# Patient Record
Sex: Male | Born: 1950 | Race: Black or African American | Hispanic: No | Marital: Married | Smoking: Current every day smoker
Health system: Southern US, Community
[De-identification: ages and names within clinical notes are randomized; demographics above are authoritative.]

## PROBLEM LIST (undated history)

## (undated) DIAGNOSIS — Z9289 Personal history of other medical treatment: Secondary | ICD-10-CM

## (undated) DIAGNOSIS — M545 Low back pain, unspecified: Secondary | ICD-10-CM

## (undated) DIAGNOSIS — C9110 Chronic lymphocytic leukemia of B-cell type not having achieved remission: Secondary | ICD-10-CM

## (undated) DIAGNOSIS — I2510 Atherosclerotic heart disease of native coronary artery without angina pectoris: Secondary | ICD-10-CM

## (undated) DIAGNOSIS — D649 Anemia, unspecified: Secondary | ICD-10-CM

## (undated) DIAGNOSIS — M1990 Unspecified osteoarthritis, unspecified site: Secondary | ICD-10-CM

## (undated) DIAGNOSIS — E785 Hyperlipidemia, unspecified: Secondary | ICD-10-CM

## (undated) DIAGNOSIS — I739 Peripheral vascular disease, unspecified: Secondary | ICD-10-CM

## (undated) DIAGNOSIS — M542 Cervicalgia: Secondary | ICD-10-CM

## (undated) HISTORY — PX: COLONOSCOPY: SHX174

## (undated) HISTORY — DX: Peripheral vascular disease, unspecified: I73.9

## (undated) HISTORY — DX: Personal history of other medical treatment: Z92.89

## (undated) HISTORY — DX: Atherosclerotic heart disease of native coronary artery without angina pectoris: I25.10

## (undated) HISTORY — DX: Low back pain: M54.5

## (undated) HISTORY — DX: Anemia, unspecified: D64.9

## (undated) HISTORY — DX: Cervicalgia: M54.2

## (undated) HISTORY — DX: Chronic lymphocytic leukemia of B-cell type not having achieved remission: C91.10

## (undated) HISTORY — DX: Hyperlipidemia, unspecified: E78.5

## (undated) HISTORY — DX: Low back pain, unspecified: M54.50

---

## 2006-12-21 DIAGNOSIS — D649 Anemia, unspecified: Secondary | ICD-10-CM

## 2006-12-21 HISTORY — DX: Anemia, unspecified: D64.9

## 2008-02-06 ENCOUNTER — Ambulatory Visit: Payer: Self-pay | Admitting: Internal Medicine

## 2009-12-21 HISTORY — PX: OTHER SURGICAL HISTORY: SHX169

## 2010-09-29 ENCOUNTER — Inpatient Hospital Stay: Payer: Self-pay | Admitting: Internal Medicine

## 2010-10-01 LAB — PATHOLOGY REPORT

## 2011-04-23 ENCOUNTER — Ambulatory Visit: Payer: Self-pay | Admitting: Internal Medicine

## 2011-05-22 ENCOUNTER — Ambulatory Visit: Payer: Self-pay | Admitting: Internal Medicine

## 2011-05-26 ENCOUNTER — Ambulatory Visit: Payer: Self-pay | Admitting: Rheumatology

## 2011-06-21 ENCOUNTER — Ambulatory Visit: Payer: Self-pay | Admitting: Internal Medicine

## 2012-01-01 ENCOUNTER — Ambulatory Visit: Payer: Self-pay | Admitting: Internal Medicine

## 2012-01-01 LAB — COMPREHENSIVE METABOLIC PANEL
Albumin: 3.9 g/dL (ref 3.4–5.0)
Anion Gap: 9 (ref 7–16)
Calcium, Total: 9 mg/dL (ref 8.5–10.1)
Chloride: 105 mmol/L (ref 98–107)
Co2: 27 mmol/L (ref 21–32)
EGFR (Non-African Amer.): >60
Glucose: 109 mg/dL — ABNORMAL HIGH (ref 65–99)
Osmolality: 282 (ref 275–301)
SGPT (ALT): 24 U/L
Sodium: 141 mmol/L (ref 136–145)
Total Protein: 7.1 g/dL (ref 6.4–8.2)

## 2012-01-01 LAB — CBC CANCER CENTER
Basophil #: 0.1 x10 3/mm (ref 0.0–0.1)
Basophil %: 1.2 %
Eosinophil #: 0.4 x10 3/mm (ref 0.0–0.7)
Eosinophil %: 6.2 %
HCT: 33.6 % — ABNORMAL LOW (ref 40.0–52.0)
HGB: 11 g/dL — ABNORMAL LOW (ref 13.0–18.0)
Lymphocyte #: 1.3 x10 3/mm (ref 1.0–3.6)
MCV: 76 fL — ABNORMAL LOW (ref 80–100)
Monocyte #: 0.3 x10 3/mm (ref 0.0–0.7)
Monocyte %: 5.3 %
RDW: 15.6 % — ABNORMAL HIGH (ref 11.5–14.5)
WBC: 5.7 x10 3/mm (ref 3.8–10.6)

## 2012-01-01 LAB — IRON AND TIBC
Iron Bind.Cap.(Total): 299 ug/dL (ref 250–450)
Iron Saturation: 24 %
Iron: 73 ug/dL (ref 65–175)

## 2012-01-01 LAB — FERRITIN: Ferritin (ARMC): 173 ng/mL (ref 8–388)

## 2012-01-01 LAB — URIC ACID: Uric Acid: 4.4 mg/dL (ref 3.5–7.2)

## 2012-01-22 ENCOUNTER — Ambulatory Visit: Payer: Self-pay | Admitting: Internal Medicine

## 2013-05-21 ENCOUNTER — Ambulatory Visit: Payer: Self-pay | Admitting: Internal Medicine

## 2013-06-14 LAB — COMPREHENSIVE METABOLIC PANEL
Albumin: 3.7 g/dL (ref 3.4–5.0)
Alkaline Phosphatase: 75 U/L (ref 50–136)
Anion Gap: 7 (ref 7–16)
Calcium, Total: 8.9 mg/dL (ref 8.5–10.1)
Chloride: 106 mmol/L (ref 98–107)
Co2: 29 mmol/L (ref 21–32)
Creatinine: 1.21 mg/dL (ref 0.60–1.30)
EGFR (African American): >60
EGFR (Non-African Amer.): >60
Glucose: 89 mg/dL (ref 65–99)
Osmolality: 282 (ref 275–301)
Potassium: 4 mmol/L (ref 3.5–5.1)
Sodium: 142 mmol/L (ref 136–145)

## 2013-06-14 LAB — IRON AND TIBC
Iron Bind.Cap.(Total): 244 ug/dL — ABNORMAL LOW (ref 250–450)
Iron Saturation: 18 %
Iron: 43 ug/dL — ABNORMAL LOW (ref 65–175)
Unbound Iron-Bind.Cap.: 201 ug/dL

## 2013-06-14 LAB — CBC CANCER CENTER
Basophil #: 0 x10 3/mm (ref 0.0–0.1)
Eosinophil %: 7.9 %
HGB: 10.2 g/dL — ABNORMAL LOW (ref 13.0–18.0)
Lymphocyte %: 47.6 %
MCH: 24.4 pg — ABNORMAL LOW (ref 26.0–34.0)
MCHC: 32.6 g/dL (ref 32.0–36.0)
Monocyte #: 0.2 x10 3/mm (ref 0.2–1.0)
Monocyte %: 7.4 %
Neutrophil #: 1.1 x10 3/mm — ABNORMAL LOW (ref 1.4–6.5)
Neutrophil %: 35.8 %
Platelet: 208 x10 3/mm (ref 150–440)
RDW: 15.3 % — ABNORMAL HIGH (ref 11.5–14.5)
WBC: 3 x10 3/mm — ABNORMAL LOW (ref 3.8–10.6)

## 2013-06-14 LAB — LACTATE DEHYDROGENASE: LDH: 189 U/L (ref 85–241)

## 2013-06-14 LAB — FERRITIN: Ferritin (ARMC): 136 ng/mL (ref 8–388)

## 2013-06-20 ENCOUNTER — Ambulatory Visit: Payer: Self-pay | Admitting: Internal Medicine

## 2013-07-25 ENCOUNTER — Ambulatory Visit: Payer: Self-pay | Admitting: Internal Medicine

## 2013-12-18 ENCOUNTER — Ambulatory Visit: Payer: Self-pay | Admitting: Vascular Surgery

## 2013-12-18 LAB — BASIC METABOLIC PANEL
Calcium, Total: 8.8 mg/dL (ref 8.5–10.1)
Chloride: 106 mmol/L (ref 98–107)
Co2: 30 mmol/L (ref 21–32)
Creatinine: 1.05 mg/dL (ref 0.60–1.30)
EGFR (African American): >60
Osmolality: 279 (ref 275–301)
Potassium: 4.1 mmol/L (ref 3.5–5.1)
Sodium: 140 mmol/L (ref 136–145)

## 2014-02-07 ENCOUNTER — Inpatient Hospital Stay: Payer: Self-pay | Admitting: Internal Medicine

## 2014-02-07 LAB — HEPATIC FUNCTION PANEL A (ARMC)
Albumin: 3.4 g/dL
Alkaline Phosphatase: 65 U/L
Bilirubin, Direct: 0.5 mg/dL — ABNORMAL HIGH
Bilirubin,Total: 3.4 mg/dL — ABNORMAL HIGH
SGOT(AST): 27 U/L
SGPT (ALT): 11 U/L — ABNORMAL LOW
Total Protein: 7.5 g/dL

## 2014-02-07 LAB — BASIC METABOLIC PANEL WITH GFR
Anion Gap: 6 — ABNORMAL LOW
BUN: 17 mg/dL
Calcium, Total: 9.2 mg/dL
Chloride: 102 mmol/L
Co2: 24 mmol/L
Creatinine: 1.21 mg/dL
EGFR (African American): >60
EGFR (Non-African Amer.): >60
Glucose: 104 mg/dL — ABNORMAL HIGH
Osmolality: 266
Potassium: 3.9 mmol/L
Sodium: 132 mmol/L — ABNORMAL LOW

## 2014-02-07 LAB — CBC
HCT: 12.4 % — CL
HGB: 4.2 g/dL — CL
MCH: 26.2 pg
MCHC: 33.7 g/dL
MCV: 78 fL — ABNORMAL LOW
Platelet: 245 10*3/uL
RBC: 1.59 x10 6/mm 3 — ABNORMAL LOW
RDW: 14.9 % — ABNORMAL HIGH
WBC: 2.9 10*3/uL — ABNORMAL LOW

## 2014-02-07 LAB — FERRITIN: Ferritin (ARMC): 259 ng/mL

## 2014-02-07 LAB — IRON AND TIBC
Iron Bind.Cap.(Total): 243 ug/dL — ABNORMAL LOW
Iron Saturation: 26 %
Iron: 64 ug/dL — ABNORMAL LOW
Unbound Iron-Bind.Cap.: 179 ug/dL

## 2014-02-07 LAB — TROPONIN I

## 2014-02-07 LAB — LACTATE DEHYDROGENASE: LDH: 322 U/L — AB (ref 85–241)

## 2014-02-08 ENCOUNTER — Ambulatory Visit: Payer: Self-pay | Admitting: Hematology and Oncology

## 2014-02-08 LAB — CBC WITH DIFFERENTIAL/PLATELET
COMMENT - H1-COM1: NORMAL
HCT: 19.1 % — ABNORMAL LOW (ref 40.0–52.0)
HGB: 6.4 g/dL — ABNORMAL LOW (ref 13.0–18.0)
Lymphocytes: 82 %
MCH: 26.8 pg (ref 26.0–34.0)
MCHC: 33.4 g/dL (ref 32.0–36.0)
MCV: 80 fL (ref 80–100)
MONOS PCT: 10 %
PLATELETS: 209 10*3/uL (ref 150–440)
RBC: 2.38 10*6/uL — AB (ref 4.40–5.90)
RDW: 17.6 % — ABNORMAL HIGH (ref 11.5–14.5)
SEGMENTED NEUTROPHILS: 4 %
Variant Lymphocyte - H1-Rlymph: 4 %
WBC: 4.1 10*3/uL (ref 3.8–10.6)

## 2014-02-08 LAB — URINALYSIS, COMPLETE
BACTERIA: NONE SEEN
BILIRUBIN, UR: NEGATIVE
BLOOD: NEGATIVE
Glucose,UR: NEGATIVE mg/dL (ref 0–75)
Ketone: NEGATIVE
LEUKOCYTE ESTERASE: NEGATIVE
Nitrite: NEGATIVE
Ph: 5 (ref 4.5–8.0)
Protein: NEGATIVE
RBC,UR: <1 /HPF (ref 0–5)
Specific Gravity: 1.014 (ref 1.003–1.030)
Squamous Epithelial: NONE SEEN
WBC UR: NONE SEEN /HPF (ref 0–5)

## 2014-02-08 LAB — BASIC METABOLIC PANEL
ANION GAP: 3 — AB (ref 7–16)
BUN: 18 mg/dL (ref 7–18)
CREATININE: 1.15 mg/dL (ref 0.60–1.30)
Calcium, Total: 9 mg/dL (ref 8.5–10.1)
Chloride: 105 mmol/L (ref 98–107)
Co2: 25 mmol/L (ref 21–32)
EGFR (African American): >60
Glucose: 90 mg/dL (ref 65–99)
Osmolality: 268 (ref 275–301)
POTASSIUM: 4.2 mmol/L (ref 3.5–5.1)
Sodium: 133 mmol/L — ABNORMAL LOW (ref 136–145)

## 2014-02-09 ENCOUNTER — Ambulatory Visit: Payer: Self-pay | Admitting: Hematology and Oncology

## 2014-02-09 LAB — CBC WITH DIFFERENTIAL/PLATELET
HCT: 19 % — AB (ref 40.0–52.0)
HGB: 6.8 g/dL — ABNORMAL LOW (ref 13.0–18.0)
LYMPHS PCT: 88 %
MCH: 29.5 pg (ref 26.0–34.0)
MCHC: 35.6 g/dL (ref 32.0–36.0)
MCV: 83 fL (ref 80–100)
Monocytes: 5 %
OTHER CELLS BLOOD: 6
Platelet: 157 10*3/uL (ref 150–440)
RBC: 2.29 10*6/uL — ABNORMAL LOW (ref 4.40–5.90)
RDW: 18 % — ABNORMAL HIGH (ref 11.5–14.5)
Segmented Neutrophils: 1 %
WBC: 3.8 10*3/uL (ref 3.8–10.6)

## 2014-02-09 LAB — BASIC METABOLIC PANEL
ANION GAP: 5 — AB (ref 7–16)
BUN: 15 mg/dL (ref 7–18)
CALCIUM: 8.6 mg/dL (ref 8.5–10.1)
CHLORIDE: 104 mmol/L (ref 98–107)
CREATININE: 0.9 mg/dL (ref 0.60–1.30)
Co2: 27 mmol/L (ref 21–32)
EGFR (African American): >60
GLUCOSE: 109 mg/dL — AB (ref 65–99)
OSMOLALITY: 273 (ref 275–301)
POTASSIUM: 4.2 mmol/L (ref 3.5–5.1)
SODIUM: 136 mmol/L (ref 136–145)

## 2014-02-09 LAB — HEPATIC FUNCTION PANEL A (ARMC)
Albumin: 3 g/dL — ABNORMAL LOW (ref 3.4–5.0)
Alkaline Phosphatase: 61 U/L
Bilirubin, Direct: 0.3 mg/dL — ABNORMAL HIGH (ref 0.00–0.20)
Bilirubin,Total: 1.4 mg/dL — ABNORMAL HIGH (ref 0.2–1.0)
SGOT(AST): 31 U/L (ref 15–37)
SGPT (ALT): 14 U/L (ref 12–78)
Total Protein: 7.1 g/dL (ref 6.4–8.2)

## 2014-02-10 LAB — CBC WITH DIFFERENTIAL/PLATELET
HCT: 23.4 % — AB (ref 40.0–52.0)
HGB: 7.9 g/dL — AB (ref 13.0–18.0)
Lymphocytes: 92 %
MCH: 27 pg (ref 26.0–34.0)
MCHC: 34 g/dL (ref 32.0–36.0)
MCV: 80 fL (ref 80–100)
Monocytes: 1 %
Platelet: 114 10*3/uL — ABNORMAL LOW (ref 150–440)
RBC: 2.94 10*6/uL — AB (ref 4.40–5.90)
RDW: 17.6 % — ABNORMAL HIGH (ref 11.5–14.5)
Segmented Neutrophils: 5 %
VARIANT LYMPHOCYTE - H1-RLYMPH: 2 %
WBC: 4.1 10*3/uL (ref 3.8–10.6)

## 2014-02-10 LAB — BILIRUBIN, TOTAL: BILIRUBIN TOTAL: 0.9 mg/dL (ref 0.2–1.0)

## 2014-02-16 LAB — CBC CANCER CENTER
BASOS ABS: 0.1 x10 3/mm (ref 0.0–0.1)
Basophil %: 0.5 %
EOS PCT: 0.6 %
Eosinophil #: 0.1 x10 3/mm (ref 0.0–0.7)
HCT: 23.6 % — ABNORMAL LOW (ref 40.0–52.0)
HGB: 7.6 g/dL — AB (ref 13.0–18.0)
Lymphocyte #: 9.7 x10 3/mm — ABNORMAL HIGH (ref 1.0–3.6)
Lymphocyte %: 76.3 %
MCH: 26.3 pg (ref 26.0–34.0)
MCHC: 32.2 g/dL (ref 32.0–36.0)
MCV: 82 fL (ref 80–100)
MONOS PCT: 3.2 %
Monocyte #: 0.4 x10 3/mm (ref 0.2–1.0)
NEUTROS PCT: 19.4 %
Neutrophil #: 2.5 x10 3/mm (ref 1.4–6.5)
Platelet: 230 x10 3/mm (ref 150–440)
RBC: 2.89 10*6/uL — ABNORMAL LOW (ref 4.40–5.90)
RDW: 19.4 % — AB (ref 11.5–14.5)
WBC: 12.7 x10 3/mm — ABNORMAL HIGH (ref 3.8–10.6)

## 2014-02-18 ENCOUNTER — Ambulatory Visit: Payer: Self-pay | Admitting: Hematology and Oncology

## 2014-02-23 LAB — CBC CANCER CENTER
BASOS ABS: 0.2 x10 3/mm — AB (ref 0.0–0.1)
BASOS PCT: 0.7 %
EOS PCT: 0.9 %
Eosinophil #: 0.2 x10 3/mm (ref 0.0–0.7)
HCT: 26.5 % — ABNORMAL LOW (ref 40.0–52.0)
HGB: 8.2 g/dL — ABNORMAL LOW (ref 13.0–18.0)
LYMPHS PCT: 57.2 %
Lymphocyte #: 13.8 x10 3/mm — ABNORMAL HIGH (ref 1.0–3.6)
MCH: 26.4 pg (ref 26.0–34.0)
MCHC: 30.9 g/dL — ABNORMAL LOW (ref 32.0–36.0)
MCV: 86 fL (ref 80–100)
MONO ABS: 0.9 x10 3/mm (ref 0.2–1.0)
MONOS PCT: 3.7 %
NEUTROS ABS: 9 x10 3/mm — AB (ref 1.4–6.5)
Neutrophil %: 37.5 %
PLATELETS: 339 x10 3/mm (ref 150–440)
RBC: 3.09 10*6/uL — ABNORMAL LOW (ref 4.40–5.90)
RDW: 22.1 % — ABNORMAL HIGH (ref 11.5–14.5)
WBC: 24.1 x10 3/mm — ABNORMAL HIGH (ref 3.8–10.6)

## 2014-03-09 LAB — CBC CANCER CENTER
BASOS ABS: 0.1 x10 3/mm (ref 0.0–0.1)
BASOS PCT: 0.5 %
EOS ABS: 0.3 x10 3/mm (ref 0.0–0.7)
Eosinophil %: 2 %
HCT: 33.2 % — ABNORMAL LOW (ref 40.0–52.0)
HGB: 10.1 g/dL — ABNORMAL LOW (ref 13.0–18.0)
Lymphocyte #: 8.9 x10 3/mm — ABNORMAL HIGH (ref 1.0–3.6)
Lymphocyte %: 64.7 %
MCH: 26.5 pg (ref 26.0–34.0)
MCHC: 30.5 g/dL — AB (ref 32.0–36.0)
MCV: 87 fL (ref 80–100)
MONOS PCT: 3.6 %
Monocyte #: 0.5 x10 3/mm (ref 0.2–1.0)
NEUTROS ABS: 4 x10 3/mm (ref 1.4–6.5)
Neutrophil %: 29.2 %
Platelet: 273 x10 3/mm (ref 150–440)
RBC: 3.83 10*6/uL — ABNORMAL LOW (ref 4.40–5.90)
RDW: 18.8 % — AB (ref 11.5–14.5)
WBC: 13.7 x10 3/mm — ABNORMAL HIGH (ref 3.8–10.6)

## 2014-03-13 LAB — CBC CANCER CENTER
BASOS ABS: 0.1 x10 3/mm (ref 0.0–0.1)
BASOS PCT: 0.8 %
Eosinophil #: 0.1 x10 3/mm (ref 0.0–0.7)
Eosinophil %: 1 %
HCT: 30.9 % — ABNORMAL LOW (ref 40.0–52.0)
HGB: 9.6 g/dL — AB (ref 13.0–18.0)
LYMPHS ABS: 7.4 x10 3/mm — AB (ref 1.0–3.6)
LYMPHS PCT: 55.1 %
MCH: 27 pg (ref 26.0–34.0)
MCHC: 31.2 g/dL — ABNORMAL LOW (ref 32.0–36.0)
MCV: 87 fL (ref 80–100)
MONO ABS: 0.4 x10 3/mm (ref 0.2–1.0)
Monocyte %: 3 %
Neutrophil #: 5.4 x10 3/mm (ref 1.4–6.5)
Neutrophil %: 40.1 %
PLATELETS: 324 x10 3/mm (ref 150–440)
RBC: 3.57 10*6/uL — AB (ref 4.40–5.90)
RDW: 17.7 % — ABNORMAL HIGH (ref 11.5–14.5)
WBC: 13.4 x10 3/mm — ABNORMAL HIGH (ref 3.8–10.6)

## 2014-03-13 LAB — BASIC METABOLIC PANEL
Anion Gap: 10 (ref 7–16)
BUN: 15 mg/dL (ref 7–18)
CO2: 27 mmol/L (ref 21–32)
CREATININE: 1.18 mg/dL (ref 0.60–1.30)
Calcium, Total: 8.8 mg/dL (ref 8.5–10.1)
Chloride: 104 mmol/L (ref 98–107)
EGFR (African American): >60
GLUCOSE: 140 mg/dL — AB (ref 65–99)
Osmolality: 284 (ref 275–301)
POTASSIUM: 3.5 mmol/L (ref 3.5–5.1)
Sodium: 141 mmol/L (ref 136–145)

## 2014-03-13 LAB — LACTATE DEHYDROGENASE: LDH: 134 U/L (ref 85–241)

## 2014-03-21 ENCOUNTER — Ambulatory Visit: Payer: Self-pay | Admitting: Hematology and Oncology

## 2014-05-02 ENCOUNTER — Ambulatory Visit: Payer: Self-pay | Admitting: Hematology and Oncology

## 2014-05-16 LAB — COMPREHENSIVE METABOLIC PANEL
Albumin: 3.6 g/dL (ref 3.4–5.0)
Alkaline Phosphatase: 66 U/L
Anion Gap: 3 — ABNORMAL LOW (ref 7–16)
BILIRUBIN TOTAL: 0.7 mg/dL (ref 0.2–1.0)
BUN: 13 mg/dL (ref 7–18)
CO2: 30 mmol/L (ref 21–32)
CREATININE: 1.08 mg/dL (ref 0.60–1.30)
Calcium, Total: 9.2 mg/dL (ref 8.5–10.1)
Chloride: 107 mmol/L (ref 98–107)
EGFR (Non-African Amer.): >60
GLUCOSE: 101 mg/dL — AB (ref 65–99)
Osmolality: 280 (ref 275–301)
POTASSIUM: 4.1 mmol/L (ref 3.5–5.1)
SGOT(AST): 20 U/L (ref 15–37)
SGPT (ALT): 14 U/L (ref 12–78)
SODIUM: 140 mmol/L (ref 136–145)
Total Protein: 6.9 g/dL (ref 6.4–8.2)

## 2014-05-16 LAB — CBC CANCER CENTER
BASOS PCT: 0.7 %
Basophil #: 0 x10 3/mm (ref 0.0–0.1)
Eosinophil #: 0.3 x10 3/mm (ref 0.0–0.7)
Eosinophil %: 6.4 %
HCT: 34.9 % — ABNORMAL LOW (ref 40.0–52.0)
HGB: 10.9 g/dL — ABNORMAL LOW (ref 13.0–18.0)
LYMPHS PCT: 40.7 %
Lymphocyte #: 1.8 x10 3/mm (ref 1.0–3.6)
MCH: 24.8 pg — AB (ref 26.0–34.0)
MCHC: 31.4 g/dL — AB (ref 32.0–36.0)
MCV: 79 fL — AB (ref 80–100)
MONOS PCT: 9.3 %
Monocyte #: 0.4 x10 3/mm (ref 0.2–1.0)
Neutrophil #: 1.9 x10 3/mm (ref 1.4–6.5)
Neutrophil %: 42.9 %
Platelet: 221 x10 3/mm (ref 150–440)
RBC: 4.41 10*6/uL (ref 4.40–5.90)
RDW: 14.7 % — ABNORMAL HIGH (ref 11.5–14.5)
WBC: 4.4 x10 3/mm (ref 3.8–10.6)

## 2014-05-21 ENCOUNTER — Ambulatory Visit: Payer: Self-pay | Admitting: Hematology and Oncology

## 2014-06-18 LAB — CBC CANCER CENTER
BASOS PCT: 1.2 %
Basophil #: 0.1 x10 3/mm (ref 0.0–0.1)
EOS ABS: 0.2 x10 3/mm (ref 0.0–0.7)
Eosinophil %: 5.4 %
HCT: 28.8 % — ABNORMAL LOW (ref 40.0–52.0)
HGB: 9.3 g/dL — ABNORMAL LOW (ref 13.0–18.0)
LYMPHS ABS: 1.5 x10 3/mm (ref 1.0–3.6)
LYMPHS PCT: 35.3 %
MCH: 24.4 pg — ABNORMAL LOW (ref 26.0–34.0)
MCHC: 32.3 g/dL (ref 32.0–36.0)
MCV: 76 fL — AB (ref 80–100)
Monocyte #: 0.2 x10 3/mm (ref 0.2–1.0)
Monocyte %: 5.4 %
Neutrophil #: 2.3 x10 3/mm (ref 1.4–6.5)
Neutrophil %: 52.7 %
Platelet: 236 x10 3/mm (ref 150–440)
RBC: 3.81 10*6/uL — AB (ref 4.40–5.90)
RDW: 15.5 % — AB (ref 11.5–14.5)
WBC: 4.4 x10 3/mm (ref 3.8–10.6)

## 2014-06-20 ENCOUNTER — Ambulatory Visit: Payer: Self-pay | Admitting: Hematology and Oncology

## 2014-07-03 LAB — IRON AND TIBC
IRON SATURATION: 12 %
Iron Bind.Cap.(Total): 282 ug/dL (ref 250–450)
Iron: 34 ug/dL — ABNORMAL LOW (ref 65–175)
Unbound Iron-Bind.Cap.: 248 ug/dL

## 2014-07-03 LAB — CBC CANCER CENTER
Basophil #: 0 x10 3/mm (ref 0.0–0.1)
Basophil %: 0.5 %
EOS PCT: 2.6 %
Eosinophil #: 0.1 x10 3/mm (ref 0.0–0.7)
HCT: 26.6 % — AB (ref 40.0–52.0)
HGB: 8.6 g/dL — ABNORMAL LOW (ref 13.0–18.0)
LYMPHS ABS: 2.3 x10 3/mm (ref 1.0–3.6)
Lymphocyte %: 43.4 %
MCH: 24.6 pg — ABNORMAL LOW (ref 26.0–34.0)
MCHC: 32.4 g/dL (ref 32.0–36.0)
MCV: 76 fL — ABNORMAL LOW (ref 80–100)
MONO ABS: 0.3 x10 3/mm (ref 0.2–1.0)
MONOS PCT: 6 %
Neutrophil #: 2.5 x10 3/mm (ref 1.4–6.5)
Neutrophil %: 47.5 %
Platelet: 228 x10 3/mm (ref 150–440)
RBC: 3.51 10*6/uL — ABNORMAL LOW (ref 4.40–5.90)
RDW: 16.5 % — ABNORMAL HIGH (ref 11.5–14.5)
WBC: 5.4 x10 3/mm (ref 3.8–10.6)

## 2014-07-11 ENCOUNTER — Ambulatory Visit: Payer: Self-pay | Admitting: Hematology and Oncology

## 2014-07-18 LAB — CBC CANCER CENTER
Basophil #: 0 x10 3/mm (ref 0.0–0.1)
Basophil %: 0.7 %
Eosinophil #: 0.2 x10 3/mm (ref 0.0–0.7)
Eosinophil %: 3.2 %
HCT: 24.6 % — AB (ref 40.0–52.0)
HGB: 8.1 g/dL — ABNORMAL LOW (ref 13.0–18.0)
LYMPHS ABS: 2.7 x10 3/mm (ref 1.0–3.6)
LYMPHS PCT: 49 %
MCH: 25.5 pg — AB (ref 26.0–34.0)
MCHC: 33.1 g/dL (ref 32.0–36.0)
MCV: 77 fL — ABNORMAL LOW (ref 80–100)
Monocyte #: 0.2 x10 3/mm (ref 0.2–1.0)
Monocyte %: 3.3 %
Neutrophil #: 2.4 x10 3/mm (ref 1.4–6.5)
Neutrophil %: 43.8 %
PLATELETS: 209 x10 3/mm (ref 150–440)
RBC: 3.19 10*6/uL — ABNORMAL LOW (ref 4.40–5.90)
RDW: 19.2 % — AB (ref 11.5–14.5)
WBC: 5.6 x10 3/mm (ref 3.8–10.6)

## 2014-07-19 ENCOUNTER — Encounter: Payer: Self-pay | Admitting: General Surgery

## 2014-07-21 ENCOUNTER — Ambulatory Visit: Payer: Self-pay | Admitting: Hematology and Oncology

## 2014-07-30 ENCOUNTER — Ambulatory Visit: Payer: Self-pay | Admitting: General Surgery

## 2014-08-14 ENCOUNTER — Encounter: Payer: Self-pay | Admitting: *Deleted

## 2014-08-21 ENCOUNTER — Ambulatory Visit: Payer: Self-pay | Admitting: Hematology and Oncology

## 2014-08-21 HISTORY — PX: LYMPH NODE BIOPSY: SHX201

## 2014-09-04 ENCOUNTER — Encounter: Payer: Self-pay | Admitting: General Surgery

## 2014-09-04 ENCOUNTER — Ambulatory Visit (INDEPENDENT_AMBULATORY_CARE_PROVIDER_SITE_OTHER): Payer: BC Managed Care – PPO | Admitting: General Surgery

## 2014-09-04 VITALS — BP 134/70 | HR 68 | Resp 14 | Ht 68.0 in | Wt 147.0 lb

## 2014-09-04 DIAGNOSIS — R591 Generalized enlarged lymph nodes: Secondary | ICD-10-CM

## 2014-09-04 DIAGNOSIS — R599 Enlarged lymph nodes, unspecified: Secondary | ICD-10-CM

## 2014-09-04 NOTE — Patient Instructions (Signed)
Patient is scheduled for surgery at [facility] on 09/13/14. He will pre admit by phone. Patient is aware of date and instructions.

## 2014-09-04 NOTE — Progress Notes (Signed)
Patient ID: [name], male   DOB: [birth year], 63 y.o.   MRN: [id number]  Chief Complaint  Patient presents with  . Other    New Pt evaluation of axillary lymphnode    HPI [name] is a 63 y.o. male. here today for a evaluation of axillary lymphnode. The patient has been followed by the hematology service for about 7 years. 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 Evaluation in the past for lymphoma has been negative.  In review of the hematology notes, node biopsy in 2011 at the [place] was negative. He has a history of rheumatoid arthritis and connective tissue disease. Previously managed on prednisone, currently on plaque when L.  The last several months he's noticed increasing fatigue and weakness but continues to work full time. He denies any shortness of breath.  Patient had a PET scan done on 07/11/14. Patient states no pain or swelling  Patient had a right axillary biopsy done several years ago at the [facility]. Results of that biopsy are not available.  The patient is employed at [employer]. 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   HPI  Past Medical History  Diagnosis Date  . Hyperlipidemia   . Anemia 2008    Past Surgical History  Procedure Laterality Date  . Lymp node removal Right 2011    arm  . Colonoscopy  2005    No family history on file.  Social History History  Substance Use Topics  . Smoking status: Former Research scientist (life sciences)  . Smokeless tobacco: Never Used  . Alcohol Use: No    No Known Allergies  Current Outpatient Prescriptions  Medication Sig Dispense Refill  . iron polysaccharides (NIFEREX) 150 MG capsule Take 150 mg by mouth daily.      . pantoprazole (PROTONIX) 40 MG tablet Take 40 mg by mouth daily.       No current facility-administered medications for this visit.    Review of Systems Review of Systems  Constitutional: Positive for appetite change and fatigue. Negative for  fever, chills, diaphoresis, activity change and unexpected weight change.  Respiratory: Negative.   Cardiovascular: Negative.     Blood pressure 134/70, pulse 68, resp. rate 14, height 5\' 8"  (1.727 m), weight 147 lb (66.679 kg).  Physical Exam Physical Exam  Constitutional: He is oriented to person, place, and time. He appears well-developed and well-nourished.  Eyes: Conjunctivae are normal. No scleral icterus.  Neck: Neck supple.  Cardiovascular: Normal rate, regular rhythm and normal heart sounds.   Pulmonary/Chest: Effort normal and breath sounds normal.  Abdominal: Soft. Normal appearance and bowel sounds are normal. There is no hepatosplenomegaly or hepatomegaly. There is no tenderness. There is no CVA tenderness. No hernia.  Lymphadenopathy:    He has no cervical adenopathy.    He has axillary adenopathy (bilateral axillary adenopathy is noted.).       Right: Inguinal adenopathy present.       Left: Inguinal adenopathy present.  Neurological: He is alert and oriented to person, place, and time.  Skin: Skin is warm and dry.    Data Reviewed CBC of 07/18/2014 showed a hemoglobin of 8.1, white blood cell count 5600 and platelet count 209,000. Mildly low MCV of 77.  Head CT dated 07/11/2014 showed lateral axillary lymph nodes the largest measuring 1.4 cm with SUV of 3.4 the left axilla. Multiple nodes in the porta hepatis measuring up to 2.4 cm. The external iliac disease  measuring 2.7 cm on the left. Subcentimeter inguinal nodes.  Assessment    Generalized lymphadenopathy, history of hemolytic anemia.    Plan    Arrangements will be made for a left axillary node biopsy in the near future. The risks associated with the procedure been reviewed. Patient is scheduled for surgery at [facility] on 09/13/14. He will pre admit by phone. Patient is aware of date and instructions.      Ref.Georgeanne Nim PA/ Dr.Choski   [name] 09/05/2014, 7:23 AM

## 2014-09-05 ENCOUNTER — Other Ambulatory Visit: Payer: Self-pay | Admitting: General Surgery

## 2014-09-05 DIAGNOSIS — R591 Generalized enlarged lymph nodes: Secondary | ICD-10-CM

## 2014-09-13 ENCOUNTER — Ambulatory Visit: Payer: Self-pay | Admitting: General Surgery

## 2014-09-13 ENCOUNTER — Encounter: Payer: Self-pay | Admitting: General Surgery

## 2014-09-13 DIAGNOSIS — R599 Enlarged lymph nodes, unspecified: Secondary | ICD-10-CM

## 2014-09-17 ENCOUNTER — Ambulatory Visit (INDEPENDENT_AMBULATORY_CARE_PROVIDER_SITE_OTHER): Payer: Self-pay | Admitting: General Surgery

## 2014-09-17 ENCOUNTER — Encounter: Payer: Self-pay | Admitting: General Surgery

## 2014-09-17 VITALS — BP 120/74 | HR 82 | Temp 97.1°F | Resp 14 | Ht 68.0 in | Wt 147.0 lb

## 2014-09-17 DIAGNOSIS — IMO0001 Reserved for inherently not codable concepts without codable children: Secondary | ICD-10-CM

## 2014-09-17 DIAGNOSIS — R591 Generalized enlarged lymph nodes: Secondary | ICD-10-CM

## 2014-09-17 DIAGNOSIS — R599 Enlarged lymph nodes, unspecified: Secondary | ICD-10-CM

## 2014-09-17 DIAGNOSIS — T888XXA Other specified complications of surgical and medical care, not elsewhere classified, initial encounter: Secondary | ICD-10-CM

## 2014-09-17 DIAGNOSIS — C8584 Other specified types of non-Hodgkin lymphoma, lymph nodes of axilla and upper limb: Secondary | ICD-10-CM

## 2014-09-17 DIAGNOSIS — T792XXA Traumatic secondary and recurrent hemorrhage and seroma, initial encounter: Secondary | ICD-10-CM

## 2014-09-17 DIAGNOSIS — C8334 Diffuse large B-cell lymphoma, lymph nodes of axilla and upper limb: Secondary | ICD-10-CM

## 2014-09-17 NOTE — Progress Notes (Signed)
Patient ID: [name], male   DOB: [birth year], 63 y.o.   MRN: [id number]  Chief Complaint  Patient presents with  . Routine Post Op    axillary node excision    HPI [name] is a 63 y.o. male who presents for a post op left axillary node biopsy. The procedure was performed on 09/13/14. Patient states he started having chills, fever,nausea, and night sweats that started last night. No fever at this time.    HPI  Past Medical History  Diagnosis Date  . Hyperlipidemia   . Anemia 2008    Past Surgical History  Procedure Laterality Date  . Lymp node removal Right 2011    arm  . Colonoscopy  2005  . Lymph node biopsy  08/2014    No family history on file.  Social History History  Substance Use Topics  . Smoking status: Former Research scientist (life sciences)  . Smokeless tobacco: Never Used  . Alcohol Use: No    No Known Allergies  Current Outpatient Prescriptions  Medication Sig Dispense Refill  . iron polysaccharides (NIFEREX) 150 MG capsule Take 150 mg by mouth daily.      . pantoprazole (PROTONIX) 40 MG tablet Take 40 mg by mouth daily.       No current facility-administered medications for this visit.    Review of Systems Review of Systems  Constitutional: Positive for chills.  Respiratory: Negative.   Cardiovascular: Negative.     Blood pressure 120/74, pulse 82, temperature 97.1 F (36.2 C), temperature source Oral, resp. rate 14, height 5\' 8"  (1.727 m), weight 147 lb (66.679 kg).  Physical Exam Physical Exam  Constitutional: He is oriented to person, place, and time. He appears well-developed and well-nourished.  Cardiovascular: Normal rate, regular rhythm and normal heart sounds.   No murmur heard. Lymphadenopathy:  Fullness in the left axilla  75 mls drained from left axilla  Neurological: He is alert and oriented to person, place, and time.  Skin: Skin is warm and dry.    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  Assessment    Left  axillary seroma status post node biopsy.     Plan    The patient was reassured. We'll plan for followup as previously scheduled on September 19, 2014     PCP: Provider Not In System Ref.Joseph Nim PA/ Dr.Choski     [name] 09/18/2014, 8:48 PM

## 2014-09-17 NOTE — Patient Instructions (Signed)
Patient to return on Wednesday for follow up. Patient advised to use heating pad in this area. The patient is aware to call back for any questions or concerns.

## 2014-09-18 DIAGNOSIS — C8334 Diffuse large B-cell lymphoma, lymph nodes of axilla and upper limb: Secondary | ICD-10-CM | POA: Insufficient documentation

## 2014-09-18 DIAGNOSIS — IMO0002 Reserved for concepts with insufficient information to code with codable children: Secondary | ICD-10-CM | POA: Insufficient documentation

## 2014-09-19 ENCOUNTER — Ambulatory Visit: Payer: BC Managed Care – PPO | Admitting: General Surgery

## 2014-09-19 ENCOUNTER — Ambulatory Visit (INDEPENDENT_AMBULATORY_CARE_PROVIDER_SITE_OTHER): Payer: BC Managed Care – PPO | Admitting: General Surgery

## 2014-09-19 VITALS — BP 102/68 | Ht 68.0 in | Wt 143.0 lb

## 2014-09-19 DIAGNOSIS — IMO0002 Reserved for concepts with insufficient information to code with codable children: Secondary | ICD-10-CM

## 2014-09-19 DIAGNOSIS — T888XXD Other specified complications of surgical and medical care, not elsewhere classified, subsequent encounter: Principal | ICD-10-CM

## 2014-09-19 DIAGNOSIS — IMO0001 Reserved for inherently not codable concepts without codable children: Secondary | ICD-10-CM

## 2014-09-19 DIAGNOSIS — T792XXD Traumatic secondary and recurrent hemorrhage and seroma, subsequent encounter: Principal | ICD-10-CM

## 2014-09-19 MED ORDER — SULFAMETHOXAZOLE-TMP DS 800-160 MG PO TABS: 1.0000 | ORAL_TABLET | Freq: Two times a day (BID) | ORAL | Status: DC

## 2014-09-19 NOTE — Progress Notes (Signed)
Patient ID: [name], male   DOB: 1951-02-03, 63 y.o.   MRN: [id number]  Chief Complaint  Patient presents with  . Routine Post Op    axillary node    HPI [name] is a 63 y.o. male who presents for a post op left axillary node biopsy. The procedure was performed on 09/13/14.Patient states the area is swollen. He states he has been sweating a lot in the past two days. No Fever some chills. The patient is scheduled for followup with medical oncology on October 2.  HPI  Past Medical History  Diagnosis Date  . Hyperlipidemia   . Anemia 2008    Past Surgical History  Procedure Laterality Date  . Lymp node removal Right 2011    arm  . Colonoscopy  2005  . Lymph node biopsy  08/2014    No family history on file.  Social History History  Substance Use Topics  . Smoking status: Former Research scientist (life sciences)  . Smokeless tobacco: Never Used  . Alcohol Use: No    No Known Allergies  Current Outpatient Prescriptions  Medication Sig Dispense Refill  . iron polysaccharides (NIFEREX) 150 MG capsule Take 150 mg by mouth daily.      . pantoprazole (PROTONIX) 40 MG tablet Take 40 mg by mouth daily.      [name] sulfamethoxazole-trimethoprim (BACTRIM DS) 800-160 MG per tablet Take 1 tablet by mouth 2 (two) times daily.  20 tablet  0   No current facility-administered medications for this visit.    Review of Systems Review of Systems  Constitutional: Positive for chills. Negative for fever, diaphoresis, activity change, appetite change, fatigue and unexpected weight change.  Respiratory: Negative.   Cardiovascular: Negative.     Blood pressure 102/68, height 5\' 8"  (1.727 m), weight 143 lb (64.864 kg).  Physical Exam Physical Exam  Constitutional: He is oriented to person, place, and time. He appears well-developed and well-nourished.  Pulmonary/Chest:    Neurological: He is alert and oriented to person, place, and time.  Skin: Skin is warm and dry.   Seroma drain placed  Drained 68ml of  fluid, Slightly murky. No odor.  Data Reviewed B cell lymphoma.  Assessment    Postprocedure seroma, symptomatic.     Plan    The patient reaccumulated fairly quickly after aspiration 2 days ago. It was elected to place a seroma catheter. 5 cc of 0.5% Xylocaine with 0.25% Marcaine with one 200,000 units of epinephrine was utilized well-tolerated. Chlor prep was applied to the skin. A small incision was made to allow passage of the 16-gauge seroma catheter. Turbid fluid without odor was obtained. The swelling in the axilla resolved nicely. The catheter was anchored in place with 3-0 nylon. Tegaderm dressing applied.  The patient was instructed to wound drainage care.  Culture was obtained. With those reports of increasing chills without fever will plan to start Bactrim DS one p.o. B.i.d. Pending culture results.    Ref.Georgeanne Nim PA/ Dr.Chosk    [name] 09/19/2014, 10:06 PM

## 2014-09-19 NOTE — Patient Instructions (Signed)
Patient to return in one week. 

## 2014-09-20 ENCOUNTER — Encounter: Payer: Self-pay | Admitting: General Surgery

## 2014-09-20 ENCOUNTER — Ambulatory Visit: Payer: Self-pay | Admitting: Internal Medicine

## 2014-09-20 LAB — CBC CANCER CENTER
BASOS ABS: 0.1 x10 3/mm (ref 0.0–0.1)
BASOS PCT: 0.7 %
Eosinophil #: 0.4 x10 3/mm (ref 0.0–0.7)
Eosinophil %: 5.2 %
HCT: 28.8 % — AB (ref 40.0–52.0)
HGB: 8.9 g/dL — ABNORMAL LOW (ref 13.0–18.0)
LYMPHS ABS: 4.2 x10 3/mm — AB (ref 1.0–3.6)
Lymphocyte %: 52.5 %
MCH: 25.5 pg — ABNORMAL LOW (ref 26.0–34.0)
MCHC: 30.9 g/dL — ABNORMAL LOW (ref 32.0–36.0)
MCV: 83 fL (ref 80–100)
MONOS PCT: 5.7 %
Monocyte #: 0.5 x10 3/mm (ref 0.2–1.0)
NEUTROS ABS: 2.9 x10 3/mm (ref 1.4–6.5)
Neutrophil %: 35.9 %
PLATELETS: 272 x10 3/mm (ref 150–440)
RBC: 3.5 10*6/uL — ABNORMAL LOW (ref 4.40–5.90)
RDW: 16.1 % — ABNORMAL HIGH (ref 11.5–14.5)
WBC: 8 x10 3/mm (ref 3.8–10.6)

## 2014-09-20 LAB — PATHOLOGY REPORT

## 2014-09-20 LAB — LACTATE DEHYDROGENASE: LDH: 139 U/L (ref 85–241)

## 2014-09-20 LAB — COMPREHENSIVE METABOLIC PANEL
ALK PHOS: 68 U/L
ALT: 26 U/L
AST: 24 U/L (ref 15–37)
Albumin: 3.4 g/dL (ref 3.4–5.0)
Anion Gap: 7 (ref 7–16)
BILIRUBIN TOTAL: 0.3 mg/dL (ref 0.2–1.0)
BUN: 13 mg/dL (ref 7–18)
CO2: 27 mmol/L (ref 21–32)
CREATININE: 1.16 mg/dL (ref 0.60–1.30)
Calcium, Total: 8.8 mg/dL (ref 8.5–10.1)
Chloride: 103 mmol/L (ref 98–107)
Glucose: 85 mg/dL (ref 65–99)
Osmolality: 273 (ref 275–301)
POTASSIUM: 4.4 mmol/L (ref 3.5–5.1)
SODIUM: 137 mmol/L (ref 136–145)
Total Protein: 7 g/dL (ref 6.4–8.2)

## 2014-09-24 ENCOUNTER — Ambulatory Visit (INDEPENDENT_AMBULATORY_CARE_PROVIDER_SITE_OTHER): Payer: Self-pay | Admitting: General Surgery

## 2014-09-24 ENCOUNTER — Telehealth: Payer: Self-pay

## 2014-09-24 ENCOUNTER — Encounter: Payer: Self-pay | Admitting: General Surgery

## 2014-09-24 VITALS — BP 102/64 | HR 68 | Resp 12 | Ht 68.0 in | Wt 152.0 lb

## 2014-09-24 DIAGNOSIS — T888XXD Other specified complications of surgical and medical care, not elsewhere classified, subsequent encounter: Secondary | ICD-10-CM

## 2014-09-24 DIAGNOSIS — T792XXD Traumatic secondary and recurrent hemorrhage and seroma, subsequent encounter: Principal | ICD-10-CM

## 2014-09-24 DIAGNOSIS — IMO0001 Reserved for inherently not codable concepts without codable children: Secondary | ICD-10-CM

## 2014-09-24 LAB — ANAEROBIC AND AEROBIC CULTURE

## 2014-09-24 NOTE — Telephone Encounter (Signed)
Patient called and said that he has fluid leaking from around his drain tube that was placed on 09/19/14. He says that this started about 2 days ago. He reports the fluid as light yellow in color. He does report drainage into his suction bulb also. He is concerned about this as the fluid is wetting his undershirts.

## 2014-09-24 NOTE — Progress Notes (Signed)
Patient ID: [name], male   DOB: March 08, 1951, 63 y.o.   MRN: [id number]  Chief Complaint  Patient presents with  . Other    drain check    HPI [name] is a 63 y.o. male here today for a drain check. Patient called and said that he has fluid leaking from around his drain tube that was placed on 09/19/14. He says that this started about 2 days ago. He reports the fluid as light yellow in color. He does report drainage into his suction bulb also. He is concerned about this as the fluid is wetting his undershirts.   HPI  Past Medical History  Diagnosis Date  . Hyperlipidemia   . Anemia 2008    Past Surgical History  Procedure Laterality Date  . Lymp node removal Right 2011    arm  . Colonoscopy  2005  . Lymph node biopsy  08/2014    No family history on file.  Social History History  Substance Use Topics  . Smoking status: Former Research scientist (life sciences)  . Smokeless tobacco: Never Used  . Alcohol Use: No    No Known Allergies  Current Outpatient Prescriptions  Medication Sig Dispense Refill  . iron polysaccharides (NIFEREX) 150 MG capsule Take 150 mg by mouth daily.      . pantoprazole (PROTONIX) 40 MG tablet Take 40 mg by mouth daily.      [name] sulfamethoxazole-trimethoprim (BACTRIM DS) 800-160 MG per tablet Take 1 tablet by mouth 2 (two) times daily.  20 tablet  0   No current facility-administered medications for this visit.    Review of Systems Review of Systems  Constitutional: Negative.   Respiratory: Negative.   Cardiovascular: Negative.     Blood pressure 102/64, pulse 68, resp. rate 12, height 5\' 8"  (1.727 m), weight 152 lb (68.947 kg).  Physical Exam Physical Exam The catheter assembly was patent but slightly angulated. This was anchored in place with Steri-Strips followed by Tegaderm dressing. The patency of the catheter was confirmed.  Data Reviewed Cultures grew Staphylococcus lugdunensis sensitive to Bactrim.  Assessment    Axillary seroma status post node  dissection (15 nodes removed).       Plan    The patient will continue his present antibiotic therapy. Followup as previously scheduled on October 8.     Ref.Georgeanne Nim PA/ Dr.Chosk    [name] 09/25/2014, 8:24 AM

## 2014-09-24 NOTE — Telephone Encounter (Signed)
I spoke with Dr Bary Castilla about this and the patient will be seen in the office today. Patient expresses understanding.

## 2014-09-25 ENCOUNTER — Ambulatory Visit: Payer: BC Managed Care – PPO | Admitting: General Surgery

## 2014-09-26 ENCOUNTER — Other Ambulatory Visit: Payer: Self-pay | Admitting: Hematology

## 2014-09-26 ENCOUNTER — Ambulatory Visit: Payer: Self-pay

## 2014-09-27 ENCOUNTER — Ambulatory Visit: Payer: BC Managed Care – PPO | Admitting: General Surgery

## 2014-09-27 ENCOUNTER — Ambulatory Visit (INDEPENDENT_AMBULATORY_CARE_PROVIDER_SITE_OTHER): Payer: Self-pay | Admitting: General Surgery

## 2014-09-27 ENCOUNTER — Encounter: Payer: Self-pay | Admitting: General Surgery

## 2014-09-27 VITALS — BP 118/74 | HR 64 | Resp 12 | Ht 68.0 in | Wt 151.0 lb

## 2014-09-27 DIAGNOSIS — IMO0001 Reserved for inherently not codable concepts without codable children: Secondary | ICD-10-CM

## 2014-09-27 DIAGNOSIS — T792XXD Traumatic secondary and recurrent hemorrhage and seroma, subsequent encounter: Secondary | ICD-10-CM

## 2014-09-27 DIAGNOSIS — T888XXD Other specified complications of surgical and medical care, not elsewhere classified, subsequent encounter: Secondary | ICD-10-CM

## 2014-09-27 NOTE — Patient Instructions (Signed)
Continue drain recording

## 2014-09-27 NOTE — Progress Notes (Signed)
Patient ID: Anthonyjames Bargar, male   DOB: 08-Aug-1951, 63 y.o.   MRN: [id number]  Chief Complaint  Patient presents with  . Routine Post Op    excision left axillary node    HPI Hideo Googe is a 63 y.o. male.  Here today for postoperative visit, left axillary node excision on 09-13-14. Drain sheet present. Drainage range from 6-8 oz daily.  HPI  Past Medical History  Diagnosis Date  . Hyperlipidemia   . Anemia 2008    Past Surgical History  Procedure Laterality Date  . Lymp node removal Right 2011    arm  . Colonoscopy  2005  . Lymph node biopsy  08/2014    No family history on file.  Social History History  Substance Use Topics  . Smoking status: Former Research scientist (life sciences)  . Smokeless tobacco: Never Used  . Alcohol Use: No    No Known Allergies  Current Outpatient Prescriptions  Medication Sig Dispense Refill  . iron polysaccharides (NIFEREX) 150 MG capsule Take 150 mg by mouth daily.      . pantoprazole (PROTONIX) 40 MG tablet Take 40 mg by mouth daily.       No current facility-administered medications for this visit.    Review of Systems Review of Systems  Constitutional: Negative.   Respiratory: Positive for cough.   Cardiovascular: Negative.   Neurological: Positive for headaches.    Blood pressure 118/74, pulse 64, resp. rate 12, height 5\' 8"  (1.727 m), weight 151 lb (68.493 kg).  Physical Exam Physical Exam No evidence of erythema, induration or tenderness in the left axilla. Drain site clean. Redressed.  Data Reviewed Culture showed some cc of staph sensitive to all antibiotics.  Seroma fluid is now clear without evidence of residual infection.  Drain record shows lines between 6 and 8 ounces per 24 hours.  Assessment    Seroma post axillary node excision.    Plan    The patient is experiencing local discomfort from the drain, but this is managed with use of ibuprofen.  We reviewed options for management of the drainage: 1) continue present close  system versus 2) removal drained anticipating strong recurrence and 3) drain removal, replacement with Penrose drain for external drainage until lymphatic channels close.  Of the options available we'll continue with her present close system drainage.  Followup exam with a nurse in one week, M.D. exam in 2 weeks.     PCP: none Ref.Georgeanne Nim PA/ Dr.Choksi    [name] 09/27/2014, 9:11 AM

## 2014-10-02 LAB — COMPREHENSIVE METABOLIC PANEL
Albumin: 3.9 g/dL (ref 3.4–5.0)
Alkaline Phosphatase: 72 U/L
Anion Gap: 8 (ref 7–16)
BUN: 11 mg/dL (ref 7–18)
Bilirubin,Total: 0.7 mg/dL (ref 0.2–1.0)
CREATININE: 1.33 mg/dL — AB (ref 0.60–1.30)
Calcium, Total: 9.1 mg/dL (ref 8.5–10.1)
Chloride: 103 mmol/L (ref 98–107)
Co2: 26 mmol/L (ref 21–32)
EGFR (Non-African Amer.): 58 — ABNORMAL LOW
Glucose: 97 mg/dL (ref 65–99)
OSMOLALITY: 273 (ref 275–301)
Potassium: 4.2 mmol/L (ref 3.5–5.1)
SGOT(AST): 18 U/L (ref 15–37)
SGPT (ALT): 18 U/L
Sodium: 137 mmol/L (ref 136–145)
TOTAL PROTEIN: 7.6 g/dL (ref 6.4–8.2)

## 2014-10-02 LAB — CBC CANCER CENTER
Basophil #: 0.1 x10 3/mm (ref 0.0–0.1)
Basophil %: 1.1 %
EOS ABS: 0.4 x10 3/mm (ref 0.0–0.7)
Eosinophil %: 3.4 %
HCT: 30.7 % — AB (ref 40.0–52.0)
HGB: 9.6 g/dL — ABNORMAL LOW (ref 13.0–18.0)
LYMPHS ABS: 5.1 x10 3/mm — AB (ref 1.0–3.6)
Lymphocyte %: 44.2 %
MCH: 25.5 pg — ABNORMAL LOW (ref 26.0–34.0)
MCHC: 31.3 g/dL — ABNORMAL LOW (ref 32.0–36.0)
MCV: 82 fL (ref 80–100)
MONO ABS: 0.3 x10 3/mm (ref 0.2–1.0)
Monocyte %: 2.9 %
Neutrophil #: 5.6 x10 3/mm (ref 1.4–6.5)
Neutrophil %: 48.4 %
Platelet: 316 x10 3/mm (ref 150–440)
RBC: 3.77 10*6/uL — ABNORMAL LOW (ref 4.40–5.90)
RDW: 16.4 % — ABNORMAL HIGH (ref 11.5–14.5)
WBC: 11.6 x10 3/mm — ABNORMAL HIGH (ref 3.8–10.6)

## 2014-10-04 ENCOUNTER — Ambulatory Visit (INDEPENDENT_AMBULATORY_CARE_PROVIDER_SITE_OTHER): Payer: Self-pay | Admitting: *Deleted

## 2014-10-04 DIAGNOSIS — T792XXD Traumatic secondary and recurrent hemorrhage and seroma, subsequent encounter: Secondary | ICD-10-CM

## 2014-10-04 DIAGNOSIS — T888XXD Other specified complications of surgical and medical care, not elsewhere classified, subsequent encounter: Secondary | ICD-10-CM

## 2014-10-04 DIAGNOSIS — IMO0001 Reserved for inherently not codable concepts without codable children: Secondary | ICD-10-CM

## 2014-10-04 NOTE — Patient Instructions (Signed)
Monitor area, call for concerns

## 2014-10-04 NOTE — Progress Notes (Signed)
Patient came in today for a wound check/drain removal.  The wound is clean, with no signs of infection noted. Drainage has been 1-2 oz a day for 6 days. Pt insist drain be removed. He is aware to call if he notices swelling in the area. Follow up as scheduled.

## 2014-10-09 ENCOUNTER — Encounter: Payer: Self-pay | Admitting: General Surgery

## 2014-10-09 LAB — CANCER CENTER HEMOGLOBIN: HGB: 9.2 g/dL — ABNORMAL LOW (ref 13.0–18.0)

## 2014-10-11 ENCOUNTER — Encounter: Payer: Self-pay | Admitting: General Surgery

## 2014-10-11 ENCOUNTER — Ambulatory Visit (INDEPENDENT_AMBULATORY_CARE_PROVIDER_SITE_OTHER): Payer: Self-pay | Admitting: General Surgery

## 2014-10-11 VITALS — BP 130/72 | HR 70 | Resp 14 | Ht 68.0 in | Wt 152.0 lb

## 2014-10-11 DIAGNOSIS — T792XXD Traumatic secondary and recurrent hemorrhage and seroma, subsequent encounter: Secondary | ICD-10-CM

## 2014-10-11 DIAGNOSIS — IMO0001 Reserved for inherently not codable concepts without codable children: Secondary | ICD-10-CM

## 2014-10-11 DIAGNOSIS — C8334 Diffuse large B-cell lymphoma, lymph nodes of axilla and upper limb: Secondary | ICD-10-CM

## 2014-10-11 DIAGNOSIS — T888XXD Other specified complications of surgical and medical care, not elsewhere classified, subsequent encounter: Secondary | ICD-10-CM

## 2014-10-11 NOTE — Progress Notes (Signed)
Patient ID: [name], male   DOB: Aug 14, 1951, 63 y.o.   MRN: [id number]    HPI [name] is a 63 y.o. male who presents for a postoperative visit, left axillary node excision on 09-13-14. The patient is doing well. No new complaints at this time.    HPI  Past Medical History  Diagnosis Date  . Hyperlipidemia   . Anemia 2008    Past Surgical History  Procedure Laterality Date  . Lymp node removal Right 2011    arm  . Colonoscopy  2005  . Lymph node biopsy  08/2014    History reviewed. No pertinent family history.  Social History History  Substance Use Topics  . Smoking status: Former Research scientist (life sciences)  . Smokeless tobacco: Never Used  . Alcohol Use: No    Allergies  Allergen Reactions  . Latex Rash    Current Outpatient Prescriptions  Medication Sig Dispense Refill  . iron polysaccharides (NIFEREX) 150 MG capsule Take 150 mg by mouth daily.      . pantoprazole (PROTONIX) 40 MG tablet Take 40 mg by mouth daily.       No current facility-administered medications for this visit.    Review of Systems Review of Systems  Constitutional: Negative.   Respiratory: Negative.   Cardiovascular: Negative.     Blood pressure 130/72, pulse 70, resp. rate 14, height 5\' 8"  (1.727 m), weight 152 lb (68.947 kg).  Physical Exam Physical Exam  Constitutional: He is oriented to person, place, and time. He appears well-developed and well-nourished.  Neurological: He is alert and oriented to person, place, and time.  Skin: Skin is warm and dry.  Axilla: No evidence of seroma.    Assessment    Doing well s/p extensive left axillary lymphadenectomy to establish lymphoma diagnosis.     Plan    Follow up here will be on an as needed basis.      PCP: none  Ref.Georgeanne Nim PA/ Dr.Choksi     [name] 10/14/2014, 9:38 AM

## 2014-10-11 NOTE — Patient Instructions (Signed)
Patient to return as needed. The patient is aware to call back for any questions or concerns. 

## 2014-10-16 LAB — COMPREHENSIVE METABOLIC PANEL
ALBUMIN: 3.8 g/dL (ref 3.4–5.0)
ALT: 18 U/L
AST: 14 U/L — AB (ref 15–37)
Alkaline Phosphatase: 65 U/L
Anion Gap: 8 (ref 7–16)
BUN: 9 mg/dL (ref 7–18)
Bilirubin,Total: 0.5 mg/dL (ref 0.2–1.0)
CREATININE: 0.95 mg/dL (ref 0.60–1.30)
Calcium, Total: 8.9 mg/dL (ref 8.5–10.1)
Chloride: 105 mmol/L (ref 98–107)
Co2: 27 mmol/L (ref 21–32)
EGFR (Non-African Amer.): >60
GLUCOSE: 94 mg/dL (ref 65–99)
Osmolality: 278 (ref 275–301)
Potassium: 4.2 mmol/L (ref 3.5–5.1)
Sodium: 140 mmol/L (ref 136–145)
Total Protein: 7.2 g/dL (ref 6.4–8.2)

## 2014-10-16 LAB — CBC CANCER CENTER
Basophil #: 0.1 x10 3/mm (ref 0.0–0.1)
Basophil %: 0.9 %
EOS ABS: 0.4 x10 3/mm (ref 0.0–0.7)
EOS PCT: 5.7 %
HCT: 32.3 % — ABNORMAL LOW (ref 40.0–52.0)
HGB: 10 g/dL — ABNORMAL LOW (ref 13.0–18.0)
LYMPHS ABS: 3.9 x10 3/mm — AB (ref 1.0–3.6)
Lymphocyte %: 50.7 %
MCH: 25.3 pg — ABNORMAL LOW (ref 26.0–34.0)
MCHC: 30.8 g/dL — AB (ref 32.0–36.0)
MCV: 82 fL (ref 80–100)
MONO ABS: 0.3 x10 3/mm (ref 0.2–1.0)
Monocyte %: 3.5 %
NEUTROS PCT: 39.2 %
Neutrophil #: 3 x10 3/mm (ref 1.4–6.5)
Platelet: 234 x10 3/mm (ref 150–440)
RBC: 3.95 10*6/uL — AB (ref 4.40–5.90)
RDW: 16.1 % — ABNORMAL HIGH (ref 11.5–14.5)
WBC: 7.7 x10 3/mm (ref 3.8–10.6)

## 2014-10-16 LAB — MAGNESIUM: MAGNESIUM: 2 mg/dL

## 2014-10-21 ENCOUNTER — Ambulatory Visit: Payer: Self-pay | Admitting: Internal Medicine

## 2014-10-23 LAB — CBC CANCER CENTER
BASOS PCT: 0.4 %
Basophil #: 0 x10 3/mm (ref 0.0–0.1)
EOS PCT: 4.4 %
Eosinophil #: 0.3 x10 3/mm (ref 0.0–0.7)
HCT: 34.4 % — ABNORMAL LOW (ref 40.0–52.0)
HGB: 10.7 g/dL — AB (ref 13.0–18.0)
LYMPHS PCT: 42.7 %
Lymphocyte #: 2.8 x10 3/mm (ref 1.0–3.6)
MCH: 25.5 pg — ABNORMAL LOW (ref 26.0–34.0)
MCHC: 31.2 g/dL — ABNORMAL LOW (ref 32.0–36.0)
MCV: 82 fL (ref 80–100)
MONOS PCT: 3.4 %
Monocyte #: 0.2 x10 3/mm (ref 0.2–1.0)
Neutrophil #: 3.2 x10 3/mm (ref 1.4–6.5)
Neutrophil %: 49.1 %
Platelet: 250 x10 3/mm (ref 150–440)
RBC: 4.2 10*6/uL — ABNORMAL LOW (ref 4.40–5.90)
RDW: 16.5 % — ABNORMAL HIGH (ref 11.5–14.5)
WBC: 6.5 x10 3/mm (ref 3.8–10.6)

## 2014-11-20 ENCOUNTER — Ambulatory Visit: Payer: Self-pay | Admitting: Internal Medicine

## 2014-11-20 LAB — CBC CANCER CENTER
Basophil #: 0.1 x10 3/mm (ref 0.0–0.1)
Basophil %: 1 %
Eosinophil #: 0.4 x10 3/mm (ref 0.0–0.7)
Eosinophil %: 7 %
HCT: 35.1 % — ABNORMAL LOW (ref 40.0–52.0)
HGB: 10.9 g/dL — ABNORMAL LOW (ref 13.0–18.0)
Lymphocyte #: 2.2 x10 3/mm (ref 1.0–3.6)
Lymphocyte %: 40.5 %
MCH: 24.8 pg — ABNORMAL LOW (ref 26.0–34.0)
MCHC: 31 g/dL — ABNORMAL LOW (ref 32.0–36.0)
MCV: 80 fL (ref 80–100)
Monocyte #: 0.3 x10 3/mm (ref 0.2–1.0)
Monocyte %: 5.6 %
Neutrophil #: 2.5 x10 3/mm (ref 1.4–6.5)
Neutrophil %: 45.9 %
Platelet: 234 x10 3/mm (ref 150–440)
RBC: 4.39 10*6/uL — ABNORMAL LOW (ref 4.40–5.90)
RDW: 15.5 % — ABNORMAL HIGH (ref 11.5–14.5)
WBC: 5.3 x10 3/mm (ref 3.8–10.6)

## 2014-11-20 LAB — COMPREHENSIVE METABOLIC PANEL
ALBUMIN: 3.6 g/dL (ref 3.4–5.0)
Alkaline Phosphatase: 68 U/L
Anion Gap: 10 (ref 7–16)
BILIRUBIN TOTAL: 0.6 mg/dL (ref 0.2–1.0)
BUN: 12 mg/dL (ref 7–18)
CHLORIDE: 106 mmol/L (ref 98–107)
Calcium, Total: 8.6 mg/dL (ref 8.5–10.1)
Co2: 26 mmol/L (ref 21–32)
Creatinine: 1.14 mg/dL (ref 0.60–1.30)
EGFR (Non-African Amer.): >60
Glucose: 123 mg/dL — ABNORMAL HIGH (ref 65–99)
Osmolality: 284 (ref 275–301)
POTASSIUM: 4 mmol/L (ref 3.5–5.1)
SGOT(AST): 14 U/L — ABNORMAL LOW (ref 15–37)
SGPT (ALT): 17 U/L
SODIUM: 142 mmol/L (ref 136–145)
Total Protein: 6.7 g/dL (ref 6.4–8.2)

## 2014-11-20 LAB — LACTATE DEHYDROGENASE: LDH: 171 U/L (ref 85–241)

## 2014-12-04 ENCOUNTER — Encounter: Payer: Self-pay | Admitting: General Surgery

## 2014-12-04 ENCOUNTER — Ambulatory Visit (INDEPENDENT_AMBULATORY_CARE_PROVIDER_SITE_OTHER): Payer: Self-pay | Admitting: General Surgery

## 2014-12-04 VITALS — BP 116/64 | HR 62 | Resp 12 | Ht 68.0 in | Wt 156.0 lb

## 2014-12-04 DIAGNOSIS — C8334 Diffuse large B-cell lymphoma, lymph nodes of axilla and upper limb: Secondary | ICD-10-CM

## 2014-12-04 NOTE — Patient Instructions (Signed)
Continue to make use of Aleve 2 tablets daily before work and heating pad for comfort after work

## 2014-12-04 NOTE — Progress Notes (Addendum)
Patient ID: [name], male   DOB: 1951/02/25, 63 y.o.   MRN: [id number]  Chief Complaint  Patient presents with  . Follow-up    left axillary pain    HPI [name] is a 63 y.o. male here today for soreness left axillary area and to him it feels like it is swollen. He had a excision of left axillary node on 09/13/14. He states the actual spot where excision was is not too bad. He states it feels like "needles" and stinging in the left arm. It does come and go and is worse after working. He also feels it is numb. He just wants to make sure everything is ok. Aleve usually helps.  HPI  Past Medical History  Diagnosis Date  . Hyperlipidemia   . Anemia 2008    Past Surgical History  Procedure Laterality Date  . Lymp node removal Right 2011    arm  . Colonoscopy  2005  . Lymph node biopsy  08/2014    No family history on file.  Social History History  Substance Use Topics  . Smoking status: Former Research scientist (life sciences)  . Smokeless tobacco: Never Used  . Alcohol Use: No    Allergies  Allergen Reactions  . Latex Rash    Current Outpatient Prescriptions  Medication Sig Dispense Refill  . iron polysaccharides (NIFEREX) 150 MG capsule Take 150 mg by mouth daily.    . naproxen sodium (ANAPROX) 220 MG tablet Take 220 mg by mouth as needed.    . nystatin-triamcinolone (MYCOLOG II) cream Apply topically as needed.   0  . pantoprazole (PROTONIX) 40 MG tablet Take 40 mg by mouth daily.    [name] VIAGRA 50 MG tablet as needed.   0   No current facility-administered medications for this visit.    Review of Systems Review of Systems  Constitutional: Negative.   Respiratory: Negative.   Cardiovascular: Negative.     Blood pressure 116/64, pulse 62, resp. rate 12, height 5\' 8"  (1.727 m), weight 156 lb (70.761 kg).  Physical Exam Physical Exam  Constitutional: He is oriented to person, place, and time. He appears well-developed and well-nourished.  Neck: Neck supple.  Cardiovascular:  Normal rate, regular rhythm and normal heart sounds.   Pulmonary/Chest: Effort normal and breath sounds normal.  Musculoskeletal:       Arms: Tender left lateral chest wall.   Lymphadenopathy:    He has no cervical adenopathy.  Neurological: He is alert and oriented to person, place, and time.  Skin: Skin is warm and dry.    Data Reviewed Biopsy showed high-grade lymphoma. Review of his metabolic panel completed at [facility] showed normal renal function.  Assessment    No evidence of recurrent seroma. No anatomic reason for focal tenderness.    Plan    Observation alone is warranted at this time. The patient is obtained good relief of his discomfort with the use of when necessary Aleve.     He was encouraged to to make use of Aleve 2 tablets daily before work and heating pad for comfort after work.  PCP: none  Ref.Georgeanne Nim PA/ Dr.Choksi   [name] 12/05/2014, 5:45 AM

## 2014-12-21 ENCOUNTER — Ambulatory Visit: Payer: Self-pay | Admitting: Internal Medicine

## 2015-01-23 ENCOUNTER — Ambulatory Visit (INDEPENDENT_AMBULATORY_CARE_PROVIDER_SITE_OTHER): Payer: BLUE CROSS/BLUE SHIELD | Admitting: General Surgery

## 2015-01-23 ENCOUNTER — Encounter: Payer: Self-pay | Admitting: General Surgery

## 2015-01-23 VITALS — BP 116/64 | HR 62 | Temp 98.1°F | Resp 14 | Ht 68.0 in | Wt 153.0 lb

## 2015-01-23 DIAGNOSIS — M792 Neuralgia and neuritis, unspecified: Secondary | ICD-10-CM

## 2015-01-23 MED ORDER — PREGABALIN 75 MG PO CAPS: 75.0000 mg | ORAL_CAPSULE | Freq: Two times a day (BID) | ORAL | Status: DC

## 2015-01-23 NOTE — Progress Notes (Signed)
Patient ID: [name], male   DOB: 1951-08-01, 64 y.o.   MRN: [id number]  Chief Complaint  Patient presents with  . Follow-up    left axilla pain and swelling x 3 weeks.     HPI [name] is a 65 y.o. male who for a follow up evaluation of his left axilla. The patient complains of pain and swelling for approximately 3 weeks. The pain is described as shooting pain as well as stinging pain. He states the heating pad and aleve are not helping. He denies any fever or chills. He had a left axillary lymph node biopsy and excision on 09/13/14. He is a current patient of Dr. Oliva Bustard and is due to follow up with him in March 2016. No treatments at this time. The patient completed 4 cycles of Rituxan therapy in November 2015.  HPI  Past Medical History  Diagnosis Date  . Hyperlipidemia   . Anemia 2008    Past Surgical History  Procedure Laterality Date  . Lymp node removal Right 2011    arm  . Colonoscopy  2005  . Lymph node biopsy  08/2014    History reviewed. No pertinent family history.  Social History History  Substance Use Topics  . Smoking status: Former Research scientist (life sciences)  . Smokeless tobacco: Never Used  . Alcohol Use: No    Allergies  Allergen Reactions  . Latex Rash    Current Outpatient Prescriptions  Medication Sig Dispense Refill  . iron polysaccharides (NIFEREX) 150 MG capsule Take 150 mg by mouth daily.    . naproxen sodium (ANAPROX) 220 MG tablet Take 220 mg by mouth as needed.    . pantoprazole (PROTONIX) 40 MG tablet Take 40 mg by mouth daily.    [name] VIAGRA 50 MG tablet as needed.   0  . pregabalin (LYRICA) 75 MG capsule Take 1 capsule (75 mg total) by mouth 2 (two) times daily. 60 capsule 4   No current facility-administered medications for this visit.    Review of Systems Review of Systems  Constitutional: Negative.   Respiratory: Negative.   Cardiovascular: Negative.     Blood pressure 116/64, pulse 62, temperature 98.1 F (36.7 C), temperature source Oral,  resp. rate 14, height 5\' 8"  (1.727 m), weight 153 lb (69.4 kg).  Physical Exam Physical Exam  Constitutional: He is oriented to person, place, and time. He appears well-developed and well-nourished.  Cardiovascular: Normal rate, regular rhythm and normal heart sounds.   No murmur heard. Pulmonary/Chest: Effort normal and breath sounds normal.  Lymphadenopathy:    He has no axillary adenopathy.  The left axilla is flat consistent with nodal dissection. No focal point tenderness. No induration. No limited shoulder range of motion. No upper extremity swelling.  Neurological: He is alert and oriented to person, place, and time.  Skin: Skin is warm and dry.       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     Plan    A trial of Lyrica, 75 mg twice a day as well as the use of local heat for comfort will be undertaken. No indication for exploration. The patient was advised that may take 6-12 weeks for maximum effect.     PCP: No Pcp  Ref. MD: Dr. Zenia Resides, Forest Gleason 01/23/2015, 8:21 PM

## 2015-01-23 NOTE — Patient Instructions (Signed)
Patient advised he can use a small pillow underneath his arm on the left side to help with pain relief. Patient to try Lyrica to help with pain. Patient advised to call our office in 6 weeks for a report on how the medication is doing. The patient is aware to call back for any questions or concerns.

## 2015-02-27 ENCOUNTER — Ambulatory Visit
Admit: 2015-02-27 | Disposition: A | Discharge status: Home / Self Care | Payer: Self-pay | Attending: Hematology and Oncology | Admitting: Hematology and Oncology

## 2015-03-15 LAB — COMPREHENSIVE METABOLIC PANEL
Albumin: 4.5 g/dL
Alkaline Phosphatase: 57 U/L
Anion Gap: 6 — ABNORMAL LOW (ref 7–16)
BUN: 11 mg/dL
Bilirubin,Total: 1.2 mg/dL
Calcium, Total: 9.3 mg/dL
Chloride: 104 mmol/L
Co2: 26 mmol/L
Creatinine: 1.09 mg/dL
EGFR (African American): >60
EGFR (Non-African Amer.): >60
Glucose: 96 mg/dL
Potassium: 3.9 mmol/L
SGOT(AST): 26 U/L
SGPT (ALT): 12 U/L — ABNORMAL LOW
Sodium: 136 mmol/L
Total Protein: 8.1 g/dL

## 2015-03-15 LAB — CBC CANCER CENTER
Basophil #: 0.1 x10 3/mm (ref 0.0–0.1)
Basophil %: 0.7 %
Eosinophil #: 0.4 x10 3/mm (ref 0.0–0.7)
Eosinophil %: 4.5 %
HCT: 40.9 % (ref 40.0–52.0)
HGB: 13 g/dL (ref 13.0–18.0)
Lymphocyte #: 3.9 x10 3/mm — ABNORMAL HIGH (ref 1.0–3.6)
Lymphocyte %: 47.7 %
MCH: 24.8 pg — ABNORMAL LOW (ref 26.0–34.0)
MCHC: 31.9 g/dL — ABNORMAL LOW (ref 32.0–36.0)
MCV: 78 fL — ABNORMAL LOW (ref 80–100)
Monocyte #: 0.4 x10 3/mm (ref 0.2–1.0)
Monocyte %: 4.7 %
Neutrophil #: 3.5 x10 3/mm (ref 1.4–6.5)
Neutrophil %: 42.4 %
Platelet: 217 x10 3/mm (ref 150–440)
RBC: 5.25 10*6/uL (ref 4.40–5.90)
RDW: 16 % — ABNORMAL HIGH (ref 11.5–14.5)
WBC: 8.2 x10 3/mm (ref 3.8–10.6)

## 2015-03-15 LAB — LACTATE DEHYDROGENASE: LDH: 131 U/L (ref 85–241)

## 2015-03-22 ENCOUNTER — Ambulatory Visit
Admit: 2015-03-22 | Disposition: A | Discharge status: Home / Self Care | Payer: Self-pay | Attending: Hematology and Oncology | Admitting: Hematology and Oncology

## 2015-04-13 NOTE — H&P (Signed)
PATIENT NAMEROMANI, [name] MR#:  [id number] DATE OF BIRTH:  07-18-1951  DATE OF ADMISSION:  02/07/2014  [facility]  EMERGENCY DEPARTMENT REFERRING PHYSICIAN:  Dr. Joni Fears  CHIEF COMPLAINT: Dizziness, near syncope, symptomatic anemia.   HISTORY OF PRESENT ILLNESS: The patient is a 64 year old African American male with history of having anemia in the past, initially diagnosed in 2011. At that time, he underwent extensive workup including EGD and subsequently colonoscopy which just showed some gastritis.  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 Inez Pilgrim. However, the patient states that he stopped going to him and does not want to see him again because the patient missed so many appointments. He also has history of having adenopathy with biopsy done at the [place]. There was suspicion for lymphoma; however, apparently the biopsy was nonrevealing. Also has rheumatoid arthritis and connective tissue disease. Was previously started on prednisone, currently on Plaquenil.  CURRENT MEDICATIONS: Hydrochlorothiazide, lisinopril 12.5/20 mg 1 tab p.o. daily. Hydrochloroquine 200 daily, ibuprofen 400 q. 6 p.r.n., naproxen 250 mg 1 tab p.o. b.i.d.   SOCIAL HISTORY: The patient is married and lives in [place]. He used to smoke 1 to 2 cigars a day. He denies alcohol use or any other illicit drug use.   FAMILY HISTORY: Mother died of alcoholic cirrhosis of the liver. Father died of cerebrovascular accident.   ALLERGIES: None.  REVIEW OF SYSTEMS:  CONSTITUTIONAL: Denies any fevers. Complains of fatigue, weakness. No pain. No weight loss. No weight gain.  EYES: No blurred or double vision. No pain. No redness. No inflammation. No glaucoma. No cataracts.  ENT: No tinnitus. No ear pain. No hearing loss. No seasonal or year-round allergies. No epistaxis. No difficulty swallowing.  RESPIRATORY: Denies any cough, wheezing, hemoptysis. Complains of  dyspnea on exertion.  CARDIOVASCULAR: Denies any chest pain. Complains of orthopnea, dyspnea on exertion. No palpitations.  GASTROINTESTINAL: No nausea, vomiting, diarrhea. No abdominal pain. No hematemesis. No melena. Has history of gastritis. Denies any blood in the stool. No GI bleed.  GENITOURINARY: Denies any dysuria, hematuria, renal calc or frequency.  ENDOCRINE: Denies any polyuria, nocturia or thyroid problem. HEMATOLOGIC AND LYMPHATIC: Has a history of anemia.  SKIN: No acne. No rash. No changes in mole, hair or skin.  MUSCULOSKELETAL: Denies any pain in the neck, back or shoulder.  NEUROLOGIC: No numbness. No CVA. No TIA. No seizures.  PSYCHIATRIC: No anxiety. No insomnia. No ADD.   PHYSICAL EXAMINATION: VITAL SIGNS: Temperature 98.8, pulse 80, respirations 20, blood pressure 102/51, O2 100%.  GENERAL: The patient is a well-developed male, appears weak in no acute distress.  HEENT: Head atraumatic, normocephalic. Pupils equally round, react to light and accommodation. There is conjunctival pallor. She has scleral icterus. Nasal exam shows no drainage or ulceration.  Oropharynx is clear without any exudate.  NECK: Supple without any JVD.  CARDIOVASCULAR: Regular rate and rhythm. No murmurs, rubs, clicks or gallops.  LUNGS: Clear to auscultation bilaterally without any rales, rhonchi, wheezing.  ABDOMEN: Soft, nontender, nondistended. Positive bowel sounds x 4. No hepatosplenomegaly.  EXTREMITIES: No clubbing, cyanosis or edema.  SKIN: No rash.  LYMPHATICS: No lymph nodes palpable.  VASCULAR: Good DP, PT pulses.  PSYCHIATRIC: Not anxious or depressed.   LABORATORY DATA: WBC count 2.9, hemoglobin 4.2, platelet count 245, MCV of 78, glucose 104, BUN 17, creatinine 1.21. Sodium 132, potassium 3.9, chloride 102, CO2 of 24. LFTs: Bilirubin total 3.4, bilirubin direct 0.5, alkaline phosphatase 65,  ALT 11, LDH 322, direct Coombs IgG is positive.   ASSESSMENT AND PLAN: The patient is a  64 year old African American male with history of Coombs positive anemia. Negative gastrointestinal workup in the past year presents with dizziness, weakness, near syncope with severe symptomatic anemia.  1.  Severe symptomatic anemia, likely due to hemolysis as a result of hemolytic anemia. The patient has been consented by the Emergency Department physician for transfusion. This hemolysis is likely chronic in nature. He will receive 2 units of packed red blood cells. We will obtain hematology consult. His MCV is low, so likely has low iron. So we will check an iron and ferritin level. 2.  Rheumatoid arthritis. Continue Plaquenil. History of lymphadenopathy with workup in the past. Strongly recommend the patient continued to be followed up with hematology/oncology as an outpatient.  NOTE: 50 minutes spent on this H and P.     ____________________________ Lafonda Mosses. Posey Pronto, MD shp:ce D: 02/07/2014 18:45:00 ET T: 02/07/2014 18:58:31 ET JOB#: [id number]  cc: Nadalee Neiswender H. Posey Pronto, MD, <Dictator> Alric Seton MD ELECTRONICALLY SIGNED 02/09/2014 14:41

## 2015-04-13 NOTE — Op Note (Signed)
PATIENT NAMERESHARD, [name] DATE OF BIRTH:  02-Jun-1951  DATE OF PROCEDURE:  09/13/2014  PREOPERATIVE DIAGNOSIS: Left axillary adenopathy, suspicion for lymphoma.   POSTOPERATIVE DIAGNOSIS: Left axillary adenopathy, suspicion for lymphoma.   OPERATIVE PROCEDURE: Left axillary node biopsy.   SURGEON: [name], MD.   ANESTHESIA: General by LMA under Dr. Myra Gianotti, Marcaine 0.5% with 1:200,000 units of epinephrine, 20 mL local infiltration.   ESTIMATED BLOOD LOSS: Less than 5 mL.   CLINICAL NOTE: This 64 year old male with a history of Coombs positive hemolytic anemia has been noted to have lymphadenopathy that is showing increased uptake on PET scanning. There is a concern for lymphoma. Axillary node biopsy was requested by his treating oncologist.   OPERATIVE NOTE: With the patient under adequate general anesthesia and a roll behind the shoulder, the left axilla and chest were prepped with ChloraPrep and draped. Marcaine was infiltrated for postoperative analgesia. A transverse incision was made in the axilla and carried down through the skin and subcutaneous tissue with hemostasis achieved by electrocautery and 3-0 Vicryl ties. Multiple large nodes up to 2 cm in diameter were identified. Several nodes were removed and sent fresh to pathology for analysis. Hemostasis was assured. The deep fascia was closed with a running 2-0 Vicryl. The skin was closed with a running 4-0 Vicryl subcuticular suture. Benzoin, Steri-Strips, Telfa, and Tegaderm dressing was applied. The patient tolerated the procedure well and was taken to the recovery room in stable condition.    ____________________________ [name], MD jwb:at D: 09/13/2014 08:59:27 ET T: 09/13/2014 09:31:22 ET JOB#: [id number]  cc: [name], MD, <Dictator> Martie Lee. Oliva Bustard, MD Yomayra Tate Amedeo Kinsman MD ELECTRONICALLY SIGNED 09/13/2014 21:43

## 2015-04-13 NOTE — Discharge Summary (Signed)
PATIENT NAMEIVOR, [name] MR#:  [id number] DATE OF BIRTH:  1951/08/30  DATE OF ADMISSION:  02/07/2014 DATE OF DISCHARGE:  02/10/2014  ADMITTING DIAGNOSIS: Anemia.   DISCHARGE DIAGNOSES: 1.   Anemia.  2.   Syncope, suspected orthostatic hypotension.  3.   Autoimmune hemolytic anemia, status post 3 units of packed red blood cell transfusion.  4.   Hyponatremia.  5.   Dehydration.  6. Lymphadenopathy, prior biopsy negative, now concerning for lymphoproliferative disorder. 7.  History of rheumatoid arthritis.   DISCHARGE CONDITION: Stable.   DISCHARGE MEDICATIONS: The patient is to continue; 1.  Naprosyn 250 mg twice daily. 2.  Hydrochloroquine 200 mg p.o. daily. 3.  Prednisone 60 mg p.o. daily. 4.  Iron polysaccharide 150 mg p.o. daily. 5.  Folic acid 1 mg p.o. daily.  6.  Protonix 40 mg p.o. daily.  7.  The patient was advised not to take ibuprofen.   HOME OXYGEN:  None.   DIET: Regular consistency.   ACTIVITY: Limitations.   FOLLOWUP APPOINTMENT:  With Dr. Kallie Edward in 2 days after discharge.   CONSULTANTS: Care management, social work, Dr. Kallie Edward, oncology.   RADIOLOGIC STUDIES: Portable single view 02/07/2014, revealed borderline cardiomegaly, normal pulmonary vascularity, no acute pulmonary disease. A CT scan of his chest and abdomen with contrast 02/09/2014 revealing no intrathoracic lymphadenopathy. There remained bulky bilateral axillary lymph nodes. No active cardiopulmonary disease was demonstrated. There are bulky intraperitoneal and retroperitoneal lymph nodes in the abdomen. No hepatosplenomegaly.  No acute abnormality of the other abdominal visceral structures demonstrated. Whole-body PET CT scan was recommended which will help direct any subsequent lymph node biopsy.  HISTORY OF PRESENT ILLNESS:  The patient is a 64 year old African-American male with past medical history of hemolytic anemia in the past who presents to the hospital with complaints of dizziness, near  syncope and symptomatic anemia. Please refer to Dr. Chana Bode Patel's admission on the 18th of February, 2015. Upon arrival to the hospital, patient's vitals;  temperature was 98.8, pulse was 80, respiratory rate was 20, blood pressure 102/51, O2 sats were 100% on room air. Physical exam was unremarkable. The patient's lab data done in the Emergency Room showed glucose of 104, sodium 132; otherwise BMP was unremarkable. The patient's LDH was high at 322. The patient's liver enzymes revealed a total bilirubin of 3.4, direct bilirubin 0.5; otherwise, liver enzymes were normal. Troponin was less than 0.02. White blood cell count was low at 2.9, hemoglobin level was 4.2 and platelet count was 245 with low MCV of 78. Antibody screen was positive. Direct Coombs was positive/CD3 was also positive. Urinalysis was unremarkable. Haptoglobin level taken on the 18th of February, 2015 was 60. EKG revealed normal sinus rhythm at 81 beats per minute, normal axis, no acute ST-T changes were noted.   HOSPITAL COURSE: The patient was admitted to the hospital for further evaluation. He was transfused 3 units of packed red blood cells in  total after which his hemoglobin level improved to 7.9 by the 21st of February, 2015. Consultation with Dr. Kallie Edward, oncologist was obtained. Dr. Kallie Edward saw the patient in consultation on 19th of February, 2015 and recommended steroid therapy and monitor CBC. She felt the patient will likely need to go to stay on prednisone long-term therapy. She discussed with patient the importance of regular follow up with cancer center and recommended to repeat CBC as outpatient approximately one week after discharge. In regards to lymphadenopathy, she felt that prior biopsy in 2011 was negative. However, she  recommended to repeat CT scan of the chest, as well as abdomen, as there was no recent imaging and made decisions about further work-up depending on CT findings. By the 21st of February, 2015, it was felt  that the patient is stable to be discharged. The patient's total bilirubin level was normalized to 0.9 and his hemoglobin was much better at 7.9. The patient was advised to continue iron, as well as folic acid supplementation for now and follow up with cancer center in the next few days after discharge for further recommendations. The patient is to continue  steroids for now at 60 mg daily dose. He is to follow up with Dr. Kallie Edward and make decisions about steroid taper, depending on her recommendations. On the day of discharge, the 25th of February, 2015, the patient's vital signs; the temperature was 97.9, pulse 50s to 60s, respiration rate was 20, blood pressure 112/69, saturation was 99 to 100% on room air at rest. In regards to lymphadenopathy, the patient is to follow up with Dr. Kallie Edward in the next few days after discharge for further recommendations. For rheumatoid arthritis, the patient is to continue Hydrochloroquine; however, he is to follow up with hematologist for recommendations.   TIME SPENT: 40 minutes.    ____________________________ Theodoro Grist, MD rv:NTS D: 02/10/2014 19:26:27 ET T: 02/10/2014 23:06:38 ET JOB#: [id number]  cc: Theodoro Grist, MD, <Dictator> Lillianne Eick MD ELECTRONICALLY SIGNED 02/21/2014 20:59

## 2015-04-13 NOTE — Op Note (Signed)
PATIENT NAMEKJELL, [name] MR#:  [id number] DATE OF BIRTH:  1951/02/21  DATE OF PROCEDURE:  12/18/2013  PREOPERATIVE DIAGNOSES: 1. Peripheral arterial disease with claudication, bilateral lower extremities, left greater than right.  2. Arthritis.   POSTOPERATIVE DIAGNOSES:  1. Peripheral arterial disease with claudication, bilateral lower extremities, left greater than right.  2. Arthritis.   PROCEDURE: 1. Catheter placement into left peroneal artery from right femoral approach.  2. Aortogram and selective left lower extremity angiogram.  3. Percutaneous transluminal angioplasty of tibioperoneal trunk and proximal peroneal artery with 3 mm diameter angioplasty balloon.  4. Percutaneous transluminal angioplasty of mid superficial femoral artery with 6 mm diameter angioplasty balloon.  5. Self-expanding stent placements of left mid superficial femoral artery with 7 mm diameter self-expanding stent for greater than 50% residual stenosis and dissection after angioplasty.  6. StarClose closure device, right femoral artery.   SURGEON: Algernon Huxley, M.D.   ANESTHESIA: Local with moderate conscious sedation.   ESTIMATED BLOOD LOSS: 25 mL.   FLUOROSCOPY TIME: About seven minutes.   CONTRAST USED: 70 mL.   INDICATION FOR PROCEDURE: This is a 64 year old male with short distance claudication, particularly in the left lower extremity. Noninvasive study showed stenosis in the left leg with reduced ABI. He is brought in for angiography for further evaluation and potential treatment. Risks and benefits were discussed. Informed consent was obtained.   DESCRIPTION OF PROCEDURE: The patient is brought to the vascular interventional radiology suite. Groins were shaved and prepped and a sterile surgical field was created. The right femoral head was localized with fluoroscopy and the right femoral artery was accessed without difficulty with Seldinger needle. A J-wire and 5 French sheath were placed.  Pigtail catheter was placed at the L1-L2 level and AP aortogram was performed. This showed patent single renal arteries bilaterally, a patent aorta and iliac vessels without flow-limiting stenosis. Then crossed the aortic bifurcation without difficulty with a Truman advantage wire and the pigtail catheter and selective left lower extremity angiogram was then performed. This showed a high-grade stenosis in the mid superficial femoral artery, that were short segment in the 80% to 90% range. He then had continuous anterior tibial artery to the foot. His tibioperoneal trunk was occluded with the peroneal artery and posterior tibial arteries reconstituting beyond the occlusion. The patient was systemically heparinized. A 6 French Ansell sheath was placed over a Truman advantage wire and crossed the SFA lesion without difficulty. The tibioperoneal trunk occlusion was crossed with a Kumpe catheter and a V18 wire initially tried to pass down the posterior tibial artery without significant success as the wire kept going out collaterals, then went down the peroneal artery without difficulty and confirmed intraluminal flow of the mid peroneal artery with a Kumpe catheter. An 0.018 wire was then replaced. Balloon angioplasty performed of the tibioperoneal trunk and proximal peroneal artery with 3 mm diameter angioplasty balloon with good angiographic result and a patent vessel following angioplasty. Then, performed angioplasty of the mid SFA stenosis with a 6 mm diameter angioplasty balloon. A waste was taken which resolved with angioplasty; however, following angioplasty, there were still a greater than 50% residual stenosis with a dissection, and this short segment lesion. A 7 mm diameter x 6 cm length self-expanding stent was selected and deployed encompassing the lesion. Postdilated with a 6 mm balloon with an excellent angiographic result. I then pulled the sheath back to the ipsilateral external iliac artery and oblique  arteriogram was performed. StarClose closure device was  deployed in the usual fashion with excellent hemostatic result. The patient tolerated the procedure well and was taken the recovery room in stable condition.     ____________________________ Algernon Huxley, MD jsd:sg D: 12/18/2013 09:27:53 ET T: 12/18/2013 10:38:20 ET JOB#: [id number]  cc: Algernon Huxley, MD, <Dictator> Ashok Norris, MD  Algernon Huxley MD ELECTRONICALLY SIGNED 12/28/2013 13:35

## 2015-04-13 NOTE — Consult Note (Signed)
History of Present Illness:  Reason for Consult Hemolytic anemia   HPI   The patient is a 64-year-old African American male with history of having anemia in the past, initially diagnosed in 2011. At that time, he underwent extensive workup including EGD and subsequently colonoscopy which just showed some gastritis.  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 Gittin. However, the patient states that he stopped going to him and does not want to see him again because the patient missed so many appointments. He also has history of having [facility] There was suspicion for lymphoma; however, apparently the biopsy was nonrevealing. Per medical charts- nodes have been stable since 2009.Also has rheumatoid arthritis and connective tissue disease. Was previously started on prednisone, currently on Plaquenil.   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   Review of Systems:  General fatigue   Performance Status (ECOG) 2   HEENT no complaints   Lungs no complaints   Cardiac no complaints   GI no complaints   GU no complaints   Musculoskeletal no complaints   Extremities no complaints   Skin no complaints   Neuro no complaints   Endocrine no complaints   Psych no complaints   NURSING NOTES: ED Vital Sign Flow Sheet:   18-Feb-15 19:12   Pulse Pulse: 83   Respirations Respirations: 20   SBP SBP: 113   DBP DBP: 62   Pulse Ox % Pulse Ox %: 100   Pulse Ox Source Source: Room Air  NURSING NOTES: **Vital Signs.:   19-Feb-15 13:02   Vital Signs Type: Routine   Temperature Temperature (F): 98.6   Celsius: 37   Temperature Source: oral   Pulse Pulse: 63   Respirations Respirations: 20   Systolic BP Systolic BP: 99   Diastolic BP (mmHg) Diastolic  BP (mmHg): 61   Mean BP: 73   Pulse Ox % Pulse Ox %: 100   Pulse Ox Activity Level: At rest   Oxygen Delivery: Room Air/ 21 %   Physical Exam:  General pleasant male in no acute distress   HEENT: normal   Lungs: clear   Cardiac: regular rate, rhythm   Breast: not examined   Abdomen: soft  nontender  positive bowel sounds   Skin: intact   Extremities: No edema, rash or cyanosis   Neuro: AAOx3   Psych: normal appearance    No Known Allergies:   Laboratory Results:  Rhogam:  18-Feb-15 18:35   Notification JARRELL HILL  Notification Date 02-07-14  Notification Time 1930  Hepatic:  18-Feb-15 15:50   Bilirubin, Total  3.4  Bilirubin, Direct  0.5 (Result(s) reported on 07 Feb 2014 at 04:55PM.)  Alkaline Phosphatase 65 (45-117 NOTE: New Reference Range 11/10/13)  SGPT (ALT)  11  SGOT (AST) 27  Total Protein, Serum 7.5  Albumin, Serum 3.4  Routine BB:  18-Feb-15 15:50   Direct Coombs, IgG (comp) Positive  C3B/C3D POSITIVE (Result(s) reported on 07 Feb 2014 at 06:25PM.)  ABO Group + Rh Type A Negative  Antibody Screen POSITIVE (Result(s) reported on 07 Feb 2014 at 06:37PM.)  Crossmatch Unit 1 Transfused  Crossmatch Unit 2 Transfused  Result(s) reported on 08 Feb 2014 at 07:21AM.    18:35   Antibody 1 Anti- SEE COMMENT  General Ref:    18-Feb-15 15:50   Haptoglobin ========== TEST NAME ==========  ========= RESULTS =========  = REFERENCE RANGE =  HAPTOGLOBIN  Haptoglobin Haptoglobin                     [   60 mg/dL             ]            [address]               [facility]            No: [id number]           [address], [place], [place] [address]           Lindon Romp, MD         [phone]   Result(s) reported on 08 Feb 2014 at 09:18AM.  Routine Chem:  18-Feb-15 15:50   Result Comment hgb - RESULTS VERIFIED BY REPEAT TESTING.  - NOTIFIED OF CRITICAL VALUE  - c/t kim cherry [id number] 02-07-14 gas  - READ-BACK PROCESS PERFORMED.   Result(s) reported on 07 Feb 2014 at 10:25PM.  Glucose, Serum  104  BUN 17  Creatinine (comp) 1.21  Sodium, Serum  132  Potassium, Serum 3.9  Chloride, Serum 102  CO2, Serum 24  Calcium (Total), Serum 9.2  Anion Gap  6  Osmolality (calc) 266  eGFR (African American) >60  eGFR (Non-African American) >60 (eGFR values <42m/min/1.73 m2 may be an indication of chronic kidney disease (CKD). Calculated eGFR is useful in patients with stable renal function. The eGFR calculation will not be reliable in acutely ill patients when serum creatinine is changing rapidly. It is not useful in  patients on dialysis. The eGFR calculation may not be applicable to patients at the low and high extremes of body sizes, pregnant women, and vegetarians.)  LDH, Serum  322 (Result(s) reported on 07 Feb 2014 at 04:55PM.)    18:35   Result Comment ANTIBODY ID - WARM AUTOANTIBODY  Result(s) reported on 07 Feb 2014 at 07:36PM.    20:19   Iron Binding Capacity (TIBC)  243  Unbound Iron Binding Capacity 179  Iron, Serum  64  Iron Saturation 26 (Result(s) reported on 07 Feb 2014 at 09:48PM.)  Ferritin ([facility] 259 (Result(s) reported on 07 Feb 2014 at 11:01PM.)  19-Feb-15 04:45   Result Comment PLATELETS - SMEAR SCANNED  - SLIGHT PLATELET CLUMPING IN SPECIMEN. ACTUAL  - NUMERICAL COUNT MAY BE SOMEWHAT HIGHER THAN  - THE REPORTED VALUE.  Result(s) reported on 08 Feb 2014 at 09:06AM.  Glucose, Serum 90  BUN 18  Creatinine (comp) 1.15  Sodium, Serum  133  Potassium, Serum 4.2  Chloride, Serum 105  CO2, Serum 25  Calcium (Total), Serum 9.0  Anion Gap  3  Osmolality (calc) 268  eGFR (African American) >60  eGFR (Non-African American) >60 (eGFR values <658mmin/1.73 m2 may be an indication of chronic kidney disease (CKD). Calculated eGFR is useful in patients with stable renal function. The eGFR calculation will not be reliable in acutely ill patients when serum creatinine is changing rapidly. It is not  useful in  patients on dialysis. The eGFR calculation may not be applicable to patients at the low and high extremes of body sizes, pregnant women, and vegetarians.)  Cardiac:  18-Feb-15 15:50   Troponin I < 0.02 (0.00-0.05 0.05 ng/mL or less: NEGATIVE  Repeat testing in 3-6 hrs  if clinically indicated. >0.05 ng/mL: POTENTIAL  MYOCARDIAL INJURY. Repeat  testing in 3-6 hrs if  clinically indicated. NOTE: An increase or decrease  of 30% or more on serial  testing suggests a  clinically important change)  Routine UA:  18-Feb-15 23:50   Color (UA) Yellow  Clarity (UA) Clear  Glucose (UA) Negative  Bilirubin (UA) Negative  Ketones (UA) Negative  Specific Gravity (UA) 1.014  Blood (UA) Negative  pH (UA) 5.0  Protein (UA) Negative  Nitrite (UA) Negative  Leukocyte Esterase (UA) Negative (Result(s) reported on 08 Feb 2014 at 03:53AM.)  RBC (UA) <1 /HPF  WBC (UA) NONE SEEN  Bacteria (UA) NONE SEEN  Epithelial Cells (UA) NONE SEEN  Mucous (UA) PRESENT (Result(s) reported on 08 Feb 2014 at 03:53AM.)  Routine Hem:  18-Feb-15 15:50   WBC (CBC)  2.9  RBC (CBC)  1.59  Hemoglobin (CBC)  4.2  Hematocrit (CBC)  12.4  Platelet Count (CBC) 245  MCV  78  MCH 26.2  MCHC 33.7  RDW  14.9  19-Feb-15 04:45   WBC (CBC) 4.1  RBC (CBC)  2.38  Hemoglobin (CBC)  6.4  Hematocrit (CBC)  19.1  Platelet Count (CBC) 209  MCV 80  MCH 26.8  MCHC 33.4  RDW  17.6  Segmented Neutrophils 4  Lymphocytes 82  Variant Lymphocytes 4  Monocytes 10  Diff Comment 1 RBCs APPEAR NORMAL  Diff Comment 2 PLTS VARIED IN SIZE  Result(s) reported on 08 Feb 2014 at 09:06AM.   Assessment and Plan: Impression:   64 Year old male with Coombs positive hemolytic anemia who has not folllowed with Dr [facility]in [facility] who presents to ED with Hb 4. Now s/p transfusion. Plan:   Labs not consistent with Iron deficiency anemia. Evidence of hemolysis with raised LDH and bilirubin.treat with Prednisone 60mg  daily amd monitor CBC.need to remain on low dose Prednisone longterm. Discussed with patient importance of regular ffup with us.follow and plan to repeat CBC as outpatient in 1 week. previous Biopsy 2011 negative. Wil repeat CT Chest/Abdomen as no recent imaging and will decide on further workup if any depending on CT findings.No B symptoms.  Electronic Signatures: Ramiah, Veshana S (MD)  (Signed 19-Feb-15 16:09)  Authored: HISTORY OF PRESENT ILLNESS, PFSH, ROS, NURSING NOTES, PE, ALLERGIES, HOME MEDICATIONS, LABS, ASSESSMENT AND PLAN   Last Updated: 19-Feb-15 16:09 by Ramiah, Veshana S (MD) 

## 2015-06-12 ENCOUNTER — Other Ambulatory Visit: Payer: Self-pay | Admitting: *Deleted

## 2015-06-12 DIAGNOSIS — C8300 Small cell B-cell lymphoma, unspecified site: Secondary | ICD-10-CM

## 2015-06-12 DIAGNOSIS — D589 Hereditary hemolytic anemia, unspecified: Secondary | ICD-10-CM

## 2015-06-13 ENCOUNTER — Inpatient Hospital Stay: Payer: BLUE CROSS/BLUE SHIELD | Attending: Hematology and Oncology

## 2015-06-13 ENCOUNTER — Inpatient Hospital Stay: Payer: BLUE CROSS/BLUE SHIELD | Admitting: Hematology and Oncology

## 2015-06-13 DIAGNOSIS — C8590 Non-Hodgkin lymphoma, unspecified, unspecified site: Secondary | ICD-10-CM | POA: Insufficient documentation

## 2015-06-13 DIAGNOSIS — Z87891 Personal history of nicotine dependence: Secondary | ICD-10-CM | POA: Diagnosis not present

## 2015-06-13 DIAGNOSIS — E785 Hyperlipidemia, unspecified: Secondary | ICD-10-CM | POA: Diagnosis not present

## 2015-06-13 DIAGNOSIS — Z9221 Personal history of antineoplastic chemotherapy: Secondary | ICD-10-CM | POA: Diagnosis not present

## 2015-06-13 DIAGNOSIS — D589 Hereditary hemolytic anemia, unspecified: Secondary | ICD-10-CM | POA: Diagnosis not present

## 2015-06-13 DIAGNOSIS — C8300 Small cell B-cell lymphoma, unspecified site: Secondary | ICD-10-CM

## 2015-06-13 LAB — CBC WITH DIFFERENTIAL/PLATELET
Basophils Absolute: 0.1 10*3/uL (ref 0–0.1)
Basophils Relative: 1 %
Eosinophils Absolute: 0.3 10*3/uL (ref 0–0.7)
Eosinophils Relative: 2 %
HCT: 37.5 % — ABNORMAL LOW (ref 40.0–52.0)
Hemoglobin: 11.7 g/dL — ABNORMAL LOW (ref 13.0–18.0)
Lymphocytes Relative: 71 %
Lymphs Abs: 8.6 10*3/uL — ABNORMAL HIGH (ref 1.0–3.6)
MCH: 23.9 pg — ABNORMAL LOW (ref 26.0–34.0)
MCHC: 31.2 g/dL — ABNORMAL LOW (ref 32.0–36.0)
MCV: 76.7 fL — ABNORMAL LOW (ref 80.0–100.0)
Monocytes Absolute: 0.3 10*3/uL (ref 0.2–1.0)
Monocytes Relative: 2 %
Neutro Abs: 3 10*3/uL (ref 1.4–6.5)
Neutrophils Relative %: 24 %
Platelets: 225 10*3/uL (ref 150–440)
RBC: 4.88 MIL/uL (ref 4.40–5.90)
RDW: 15.3 % — ABNORMAL HIGH (ref 11.5–14.5)
WBC: 12.3 10*3/uL — ABNORMAL HIGH (ref 3.8–10.6)

## 2015-06-13 LAB — COMPREHENSIVE METABOLIC PANEL
ALT: 12 U/L — ABNORMAL LOW (ref 17–63)
AST: 19 U/L (ref 15–41)
Albumin: 3.8 g/dL (ref 3.5–5.0)
Alkaline Phosphatase: 50 U/L (ref 38–126)
Anion gap: 4 — ABNORMAL LOW (ref 5–15)
BUN: 12 mg/dL (ref 6–20)
CO2: 26 mmol/L (ref 22–32)
Calcium: 8.3 mg/dL — ABNORMAL LOW (ref 8.9–10.3)
Chloride: 105 mmol/L (ref 101–111)
Creatinine, Ser: 1.04 mg/dL (ref 0.61–1.24)
GFR calc Af Amer: >60 mL/min (ref >60)
GFR calc non Af Amer: >60 mL/min (ref >60)
Glucose, Bld: 139 mg/dL — ABNORMAL HIGH (ref 65–99)
Potassium: 3.9 mmol/L (ref 3.5–5.1)
Sodium: 135 mmol/L (ref 135–145)
Total Bilirubin: 0.6 mg/dL (ref 0.3–1.2)
Total Protein: 7.4 g/dL (ref 6.5–8.1)

## 2015-06-14 ENCOUNTER — Inpatient Hospital Stay (HOSPITAL_BASED_OUTPATIENT_CLINIC_OR_DEPARTMENT_OTHER): Payer: BLUE CROSS/BLUE SHIELD | Admitting: Hematology and Oncology

## 2015-06-14 VITALS — BP 120/78 | HR 56 | Temp 96.2°F | Resp 20 | Wt 150.6 lb

## 2015-06-14 DIAGNOSIS — C8590 Non-Hodgkin lymphoma, unspecified, unspecified site: Secondary | ICD-10-CM | POA: Diagnosis not present

## 2015-06-14 DIAGNOSIS — C9110 Chronic lymphocytic leukemia of B-cell type not having achieved remission: Secondary | ICD-10-CM

## 2015-06-14 DIAGNOSIS — D589 Hereditary hemolytic anemia, unspecified: Secondary | ICD-10-CM | POA: Diagnosis not present

## 2015-06-14 DIAGNOSIS — Z9221 Personal history of antineoplastic chemotherapy: Secondary | ICD-10-CM | POA: Diagnosis not present

## 2015-06-14 DIAGNOSIS — D509 Iron deficiency anemia, unspecified: Secondary | ICD-10-CM

## 2015-06-14 DIAGNOSIS — E785 Hyperlipidemia, unspecified: Secondary | ICD-10-CM | POA: Diagnosis not present

## 2015-06-14 DIAGNOSIS — Z87891 Personal history of nicotine dependence: Secondary | ICD-10-CM

## 2015-06-14 NOTE — Progress Notes (Signed)
Patient here today for ongoing follow up regarding anemia and lymphoma. Patient reports back pain x 1 month. Patient also reports tingling and pain to feet, fatigue and decreased appetite.

## 2015-06-15 ENCOUNTER — Encounter: Payer: Self-pay | Admitting: Hematology and Oncology

## 2015-06-15 DIAGNOSIS — D589 Hereditary hemolytic anemia, unspecified: Secondary | ICD-10-CM | POA: Insufficient documentation

## 2015-06-15 DIAGNOSIS — C9110 Chronic lymphocytic leukemia of B-cell type not having achieved remission: Secondary | ICD-10-CM | POA: Insufficient documentation

## 2015-06-15 DIAGNOSIS — D509 Iron deficiency anemia, unspecified: Secondary | ICD-10-CM | POA: Insufficient documentation

## 2015-06-15 DIAGNOSIS — D563 Thalassemia minor: Secondary | ICD-10-CM | POA: Insufficient documentation

## 2015-06-15 HISTORY — DX: Chronic lymphocytic leukemia of B-cell type not having achieved remission: C91.10

## 2015-06-15 NOTE — Progress Notes (Signed)
[facility] day:  06/14/2015  Chief Complaint: [name] is a 64 y.o. male with lymphocytic lymphoma with associated hemolytic anemia seen for reassessment.  HPI:  The patient was noted to have adenopathy since 2009. He underwent biopsy of a right axillary node at the [facility]. Pathology was "suspicious for lymphoma".  He was diagnosed with anemia in 2011. He underwent a workup (EGD and colonoscopy) which revealed gastritis.  He was admitted in 01/2014 with a hemoglobin of 4.2. He underwent transfusion. He was diagnosed with hemolytic anemia and treated with prednisone. With taper of prednisone, his hemolysis returned.  Chest and abdomen CT scan on 02/09/2014 revealed bulky bilateral axillary adenopathy and bulky intraperitoneal and retroperitoneal adenopathy. PET scan on 07/11/2014 revealed mildly hypermetabolic lymph nodes in the chest, abdomen, and pelvis and mildly hypermetabolic spleen.   He underwent left axillary node biopsy on 09/13/2014 by Dr. Bary Castilla.  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  Flow cytometry revealed dim CD19 +, CD20+ (dim), CD5 +, CD23+, CD43 + and CD10 -.  He received 4 weekly cycles of Rituxan 10/02/2014 until 10/23/2014. He tolerated his infusions well.  He states that he has been doing well. He denies any fever sweats or weight loss or adenopathy. Energy level is good.  He has not required a transfusion in some time. He feels that his diet is good.  He eats Conservation officer, historic buildings and chicken. He denies any melana or hematochezia. He states that he is due for a colonoscopy.    Past Medical History  Diagnosis Date  . Hyperlipidemia   . Anemia 2008    Past Surgical History  Procedure Laterality Date  . Lymp node removal Right 2011    arm  . Colonoscopy  2005  . Lymph node biopsy  08/2014    No family history on file.  Social History:  reports that he has quit  smoking. He has never used smokeless tobacco. He reports that he does not drink alcohol or use illicit drugs.  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 The patient is alone today.  Allergies:  Allergies  Allergen Reactions  . Latex Rash    Current Medications: Current Outpatient Prescriptions  Medication Sig Dispense Refill  . iron polysaccharides (NIFEREX) 150 MG capsule Take 150 mg by mouth daily.    . naproxen sodium (ANAPROX) 220 MG tablet Take 220 mg by mouth as needed.    . pantoprazole (PROTONIX) 40 MG tablet Take 40 mg by mouth daily.    . pregabalin (LYRICA) 75 MG capsule Take 1 capsule (75 mg total) by mouth 2 (two) times daily. 60 capsule 4  . VIAGRA 50 MG tablet as needed.   0   No current facility-administered medications for this visit.    Review of Systems:  GENERAL:  Feels good.  Active.  No fevers, sweats or weight loss.  Sometimes feels hot and cold. PERFORMANCE STATUS (ECOG):  0 HEENT:  No visual changes, runny nose, sore throat, mouth sores or tenderness. Lungs: No shortness of breath or cough.  No hemoptysis. Cardiac:  No chest pain, palpitations, orthopnea, or PND. GI:  Up and down appetite.  Hungry.  Sometimes can't eat.  No nausea, vomiting, diarrhea, constipation, melena or hematochezia. GU:  Urgency.  No frequency, dysuria, or hematuria.  Needs PSA checked. Musculoskeletal:  Legs hurt at times to his feet.  Feels a "pressure release  after his shoes are off".  Back went out.  Arthritis.  No muscle tenderness. Extremities:  No pain or swelling. Skin:  No rashes or skin changes. Neuro:  No headache, numbness or weakness, balance or coordination issues. Endocrine:  No diabetes, thyroid issues, hot flashes or night sweats. Psych:  No mood changes, depression or anxiety. Pain:  No focal pain. Review of systems:  All other systems reviewed and found to be negative.   Physical Exam: Blood pressure 120/78, pulse 56, temperature  96.2 F (35.7 C), temperature source Tympanic, resp. rate 20, weight 150 lb 9.2 oz (68.3 kg). GENERAL:  Well developed, well nourished, sitting comfortably in the exam room in no acute distress. MENTAL STATUS:  Alert and oriented to person, place and time. HEAD:  Wearing a white cap.  Alopecia.  Graying goatee.  Normocephalic, atraumatic, face symmetric, no Cushingoid features. EYES:  Brown eyes.  Pupils equal round and reactive to light and accomodation.  No conjunctivitis or scleral icterus. ENT:  Oropharynx clear without lesion.  Tongue normal. Mucous membranes moist.  RESPIRATORY:  Clear to auscultation without rales, wheezes or rhonchi. CARDIOVASCULAR:  Regular rate and rhythm without murmur, rub or gallop. ABDOMEN:  Soft, non-tender, with active bowel sounds, and no hepatosplenomegaly.  No masses. SKIN:  No rashes, ulcers or lesions. EXTREMITIES: No edema, no skin discoloration or tenderness.  No palpable cords. LYMPH NODES: No palpable cervical, supraclavicular, axillary or inguinal adenopathy  NEUROLOGICAL: Unremarkable. PSYCH:  Appropriate.  Appointment on 06/13/2015  Component Date Value Ref Range Status  . WBC 06/13/2015 12.3* 3.8 - 10.6 K/uL Final  . RBC 06/13/2015 4.88  4.40 - 5.90 MIL/uL Final  . Hemoglobin 06/13/2015 11.7* 13.0 - 18.0 g/dL Final  . HCT 06/13/2015 37.5* 40.0 - 52.0 % Final  . MCV 06/13/2015 76.7* 80.0 - 100.0 fL Final  . MCH 06/13/2015 23.9* 26.0 - 34.0 pg Final  . MCHC 06/13/2015 31.2* 32.0 - 36.0 g/dL Final  . RDW 06/13/2015 15.3* 11.5 - 14.5 % Final  . Platelets 06/13/2015 225  150 - 440 K/uL Final  . Neutrophils Relative % 06/13/2015 24   Final  . Neutro Abs 06/13/2015 3.0  1.4 - 6.5 K/uL Final  . Lymphocytes Relative 06/13/2015 71   Final  . Lymphs Abs 06/13/2015 8.6* 1.0 - 3.6 K/uL Final  . Monocytes Relative 06/13/2015 2   Final  . Monocytes Absolute 06/13/2015 0.3  0.2 - 1.0 K/uL Final  . Eosinophils Relative 06/13/2015 2   Final  . Eosinophils  Absolute 06/13/2015 0.3  0 - 0.7 K/uL Final  . Basophils Relative 06/13/2015 1   Final  . Basophils Absolute 06/13/2015 0.1  0 - 0.1 K/uL Final  . Sodium 06/13/2015 135  135 - 145 mmol/L Final  . Potassium 06/13/2015 3.9  3.5 - 5.1 mmol/L Final  . Chloride 06/13/2015 105  101 - 111 mmol/L Final  . CO2 06/13/2015 26  22 - 32 mmol/L Final  . Glucose, Bld 06/13/2015 139* 65 - 99 mg/dL Final  . BUN 06/13/2015 12  6 - 20 mg/dL Final  . Creatinine, Ser 06/13/2015 1.04  0.61 - 1.24 mg/dL Final  . Calcium 06/13/2015 8.3* 8.9 - 10.3 mg/dL Final  . Total Protein 06/13/2015 7.4  6.5 - 8.1 g/dL Final  . Albumin 06/13/2015 3.8  3.5 - 5.0 g/dL Final  . AST 06/13/2015 19  15 - 41 U/L Final  . ALT 06/13/2015 12* 17 - 63 U/L Final  . Alkaline Phosphatase 06/13/2015 50  38 - 126 U/L Final  . Total Bilirubin 06/13/2015 0.6  0.3 - 1.2 mg/dL Final  . GFR calc non Af Amer 06/13/2015 >60  >60 mL/min Final  . GFR calc Af Amer 06/13/2015 >60  >60 mL/min Final   Comment: (NOTE) The eGFR has been calculated using the CKD EPI equation. This calculation has not been validated in all clinical situations. eGFR's persistently <60 mL/min signify possible Chronic Kidney Disease.   . Anion gap 06/13/2015 4* 5 - 15 Final    Assessment:  [name] is a 64 y.o. 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  He was treated with prednisone. With taper of prednisone, his hemolysis returned.  Chest and abdomen CT scan on 02/09/2014 revealed bulky bilateral axillary adenopathy and bulky intraperitoneal and retroperitoneal adenopathy. PET scan on 07/11/2014 revealed mildly hypermetabolic lymph nodes in the chest, abdomen, and pelvis and mildly hypermetabolic spleen.   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  He received 4 weekly cycles of Rituxan (10/02/2014 - 10/23/2014). His hematocrit normalized.  He has  slowly progressive microcytic anemia over the past 3 months.  He feels that his diet is good.  He eats Conservation officer, historic buildings and chicken. He denies any melana or hematochezia.  EGD and colonoscopy in 2011 revealed gastritis.  He states that he is due for a colonoscopy.  Symptomatically, he denies any fevers, sweats, or weight loss or adenopathy.   Plan: 1. Review entire medical history, diagnosis of hemolytic anemia/CLL, and treament to date. 2. Discuss evolving microcytic anemia. 3. Labs today:  CBC with diff, CMP. 4. Guaiac cards x 3. 5. RTC next week for labs (LDH, uric acid, retic, ferritin, iron studies). 6. RTC on 07/14 for MD assessment and labs (CBC with diff).  Lequita Asal, MD  06/15/2015, 5:22 PM

## 2015-06-21 ENCOUNTER — Inpatient Hospital Stay: Payer: BLUE CROSS/BLUE SHIELD | Attending: Hematology and Oncology

## 2015-06-21 DIAGNOSIS — E785 Hyperlipidemia, unspecified: Secondary | ICD-10-CM | POA: Insufficient documentation

## 2015-06-21 DIAGNOSIS — Z8719 Personal history of other diseases of the digestive system: Secondary | ICD-10-CM | POA: Insufficient documentation

## 2015-06-21 DIAGNOSIS — R21 Rash and other nonspecific skin eruption: Secondary | ICD-10-CM | POA: Insufficient documentation

## 2015-06-21 DIAGNOSIS — D589 Hereditary hemolytic anemia, unspecified: Secondary | ICD-10-CM | POA: Insufficient documentation

## 2015-06-21 DIAGNOSIS — C8300 Small cell B-cell lymphoma, unspecified site: Secondary | ICD-10-CM | POA: Insufficient documentation

## 2015-06-21 DIAGNOSIS — F17210 Nicotine dependence, cigarettes, uncomplicated: Secondary | ICD-10-CM | POA: Insufficient documentation

## 2015-06-21 DIAGNOSIS — Z79899 Other long term (current) drug therapy: Secondary | ICD-10-CM | POA: Insufficient documentation

## 2015-06-29 DIAGNOSIS — C8300 Small cell B-cell lymphoma, unspecified site: Secondary | ICD-10-CM | POA: Diagnosis present

## 2015-06-29 DIAGNOSIS — F17210 Nicotine dependence, cigarettes, uncomplicated: Secondary | ICD-10-CM | POA: Diagnosis not present

## 2015-06-29 DIAGNOSIS — Z79899 Other long term (current) drug therapy: Secondary | ICD-10-CM | POA: Diagnosis not present

## 2015-06-29 DIAGNOSIS — E785 Hyperlipidemia, unspecified: Secondary | ICD-10-CM | POA: Diagnosis not present

## 2015-06-29 DIAGNOSIS — R21 Rash and other nonspecific skin eruption: Secondary | ICD-10-CM | POA: Diagnosis not present

## 2015-06-29 DIAGNOSIS — Z8719 Personal history of other diseases of the digestive system: Secondary | ICD-10-CM | POA: Diagnosis not present

## 2015-06-29 DIAGNOSIS — D589 Hereditary hemolytic anemia, unspecified: Secondary | ICD-10-CM | POA: Diagnosis not present

## 2015-07-01 ENCOUNTER — Telehealth: Payer: Self-pay

## 2015-07-01 NOTE — Telephone Encounter (Signed)
Spoke with patient about coming in to discuss having a repeat colonoscopy done as well as an upper endoscopy. He is amendable to this. Patient is scheduled to follow up in the office on Wednesday July 13th at 1:00 pm for this.

## 2015-07-01 NOTE — Telephone Encounter (Signed)
-----   Message from [name], MD sent at 07/01/2015 10:41 AM EDT ----- Please contact the patient and notify him that in discussion with Dr. Mike Gip it would be appropriate for him to get a repeat colonoscopy and upper endoscopy to look for any site of bleeding. We can complete this to this office if he is so inclined. Brief visit heart procedure would be needed. Thank you

## 2015-07-03 ENCOUNTER — Encounter: Payer: Self-pay | Admitting: General Surgery

## 2015-07-03 ENCOUNTER — Ambulatory Visit (INDEPENDENT_AMBULATORY_CARE_PROVIDER_SITE_OTHER): Payer: BLUE CROSS/BLUE SHIELD | Admitting: General Surgery

## 2015-07-03 VITALS — BP 118/68 | HR 76 | Resp 12 | Ht 68.0 in | Wt 149.6 lb

## 2015-07-03 DIAGNOSIS — Z1211 Encounter for screening for malignant neoplasm of colon: Secondary | ICD-10-CM

## 2015-07-03 DIAGNOSIS — C8334 Diffuse large B-cell lymphoma, lymph nodes of axilla and upper limb: Secondary | ICD-10-CM

## 2015-07-03 MED ORDER — POLYETHYLENE GLYCOL 3350 17 GM/SCOOP PO POWD: ORAL | Status: DC

## 2015-07-03 NOTE — H&P (Signed)
Patient ID: [name], male DOB: 17-Nov-1951, 64 y.o. MRN: [id number]  Chief Complaint   Patient presents with   .  Other     discuss colonoscopy and upper endoscopy    HPI  [name] is a 64 y.o. male here today to discuss having a colonoscopy and upper endoscopy. Last colonoscopy was done in 2005. Patient states he feels well. He has had recent blood work that shows his blood count decreasing. He reports that he has had some pudding like bowel movements with mucus discharge. He has stool guaiac tests that are still pending. HPI  Past Medical History   Diagnosis  Date   .  Hyperlipidemia    .  Anemia  2008    Past Surgical History   Procedure  Laterality  Date   .  Lymp node removal  Right  2011     arm   .  Colonoscopy     .  Lymph node biopsy   08/2014    No family history on file.  Social History  History   Substance Use Topics   .  Smoking status:  Former Research scientist (life sciences)   .  Smokeless tobacco:  Never Used   .  Alcohol Use:  No    Allergies   Allergen  Reactions   .  Latex  Rash    Current Outpatient Prescriptions   Medication  Sig  Dispense  Refill   .  iron polysaccharides (NIFEREX) 150 MG capsule  Take 150 mg by mouth daily.     .  naproxen sodium (ANAPROX) 220 MG tablet  Take 220 mg by mouth as needed.     .  pantoprazole (PROTONIX) 40 MG tablet  Take 40 mg by mouth daily.     .  pregabalin (LYRICA) 75 MG capsule  Take 1 capsule (75 mg total) by mouth 2 (two) times daily.  60 capsule  4   .  VIAGRA 50 MG tablet  as needed.   0   .  polyethylene glycol powder (GLYCOLAX/MIRALAX) powder  255 grams one bottle for colonoscopy prep  255 g  0    No current facility-administered medications for this visit.    Review of Systems  Review of Systems  Constitutional: Negative.  Respiratory: Negative.  Cardiovascular: Negative.  Gastrointestinal: Positive for rectal pain. Negative for nausea, vomiting, abdominal pain, diarrhea, constipation, blood in stool, abdominal distention  and anal bleeding.   Blood pressure 118/68, pulse 76, resp. rate 12, height 5\' 8"  (1.727 m), weight 149 lb 9.6 oz (67.858 kg).  Physical Exam  Physical Exam  Constitutional: He is oriented to person, place, and time. He appears well-developed and well-nourished.  Eyes: Conjunctivae are normal. No scleral icterus.  Neck: Neck supple.  Cardiovascular: Normal rate, regular rhythm and normal heart sounds.  Pulmonary/Chest: Effort normal and breath sounds normal.  Abdominal: Soft. Normal appearance and bowel sounds are normal. He exhibits no mass. There is no tenderness.  Lymphadenopathy:  He has no cervical adenopathy.  Neurological: He is alert and oriented to person, place, and time.  Skin: Skin is warm and dry.  Psychiatric: He has a normal mood and affect.   Data Reviewed  Serial laboratory studies have shown a steady fall in his MCV from 82-77. Borderline low hemoglobin.  Assessment   Developing microcytic indices.   Plan   Indications for colonoscopy and upper endoscopy were reviewed.   Colonoscopy with possible biopsy/polypectomy prn: Information regarding the procedure, including its potential risks  and complications (including but not limited to perforation of the bowel, which may require emergency surgery to repair, and bleeding) was verbally given to the patient. Educational information regarding lower instestinal endoscopy was given to the patient. Written instructions for how to complete the bowel prep using Miralax were provided. The importance of drinking ample fluids to avoid dehydration as a result of the prep emphasized.  Patient has been scheduled for an upper and lower endoscopy on 07-23-15 at [facility].  [name]  07/03/2015, 8:18 PM

## 2015-07-03 NOTE — Progress Notes (Signed)
Patient ID: [name], male   DOB: Jun 01, 1951, 64 y.o.   MRN: [id number]  Chief Complaint  Patient presents with  . Other    discuss colonoscopy and upper endoscopy    HPI [name] is a 64 y.o. male here today to discuss having a colonoscopy and upper endoscopy. Last colonoscopy was done in 2005. Patient states he feels well. He has had recent blood work that shows his blood count decreasing. He reports that he has had some pudding like bowel movements with mucus discharge. He has stool guaiac tests that are still pending.     HPI  Past Medical History  Diagnosis Date  . Hyperlipidemia   . Anemia 2008    Past Surgical History  Procedure Laterality Date  . Lymp node removal Right 2011    arm  . Colonoscopy    . Lymph node biopsy  08/2014    No family history on file.  Social History History  Substance Use Topics  . Smoking status: Former Research scientist (life sciences)  . Smokeless tobacco: Never Used  . Alcohol Use: No    Allergies  Allergen Reactions  . Latex Rash    Current Outpatient Prescriptions  Medication Sig Dispense Refill  . iron polysaccharides (NIFEREX) 150 MG capsule Take 150 mg by mouth daily.    . naproxen sodium (ANAPROX) 220 MG tablet Take 220 mg by mouth as needed.    . pantoprazole (PROTONIX) 40 MG tablet Take 40 mg by mouth daily.    . pregabalin (LYRICA) 75 MG capsule Take 1 capsule (75 mg total) by mouth 2 (two) times daily. 60 capsule 4  . VIAGRA 50 MG tablet as needed.   0  . polyethylene glycol powder (GLYCOLAX/MIRALAX) powder 255 grams one bottle for colonoscopy prep 255 g 0   No current facility-administered medications for this visit.    Review of Systems Review of Systems  Constitutional: Negative.   Respiratory: Negative.   Cardiovascular: Negative.   Gastrointestinal: Positive for rectal pain. Negative for nausea, vomiting, abdominal pain, diarrhea, constipation, blood in stool, abdominal distention and anal bleeding.    Blood pressure 118/68,  pulse 76, resp. rate 12, height 5\' 8"  (1.727 m), weight 149 lb 9.6 oz (67.858 kg).  Physical Exam Physical Exam  Constitutional: He is oriented to person, place, and time. He appears well-developed and well-nourished.  Eyes: Conjunctivae are normal. No scleral icterus.  Neck: Neck supple.  Cardiovascular: Normal rate, regular rhythm and normal heart sounds.   Pulmonary/Chest: Effort normal and breath sounds normal.  Abdominal: Soft. Normal appearance and bowel sounds are normal. He exhibits no mass. There is no tenderness.  Lymphadenopathy:    He has no cervical adenopathy.  Neurological: He is alert and oriented to person, place, and time.  Skin: Skin is warm and dry.  Psychiatric: He has a normal mood and affect.    Data Reviewed Serial laboratory studies have shown a steady fall in his MCV from 82-77. Borderline low hemoglobin.  Assessment    Developing microcytic indices.    Plan    Indications for colonoscopy and upper endoscopy were reviewed.     Colonoscopy with possible biopsy/polypectomy prn: Information regarding the procedure, including its potential risks and complications (including but not limited to perforation of the bowel, which may require emergency surgery to repair, and bleeding) was verbally given to the patient. Educational information regarding lower instestinal endoscopy was given to the patient. Written instructions for how to complete the bowel prep using Miralax were provided.  The importance of drinking ample fluids to avoid dehydration as a result of the prep emphasized.  Patient has been scheduled for an upper and lower endoscopy on 07-23-15 at [facility].   [name] 07/03/2015, 8:18 PM

## 2015-07-03 NOTE — Patient Instructions (Addendum)
Colonoscopy A colonoscopy is an exam to look at the entire large intestine (colon). This exam can help find problems such as tumors, polyps, inflammation, and areas of bleeding. The exam takes about 1 hour.  LET [facility] CARE PROVIDER KNOW ABOUT:   Any allergies you have.  All medicines you are taking, including vitamins, herbs, eye drops, creams, and over-the-counter medicines.  Previous problems you or members of your family have had with the use of anesthetics.  Any blood disorders you have.  Previous surgeries you have had.  Medical conditions you have. RISKS AND COMPLICATIONS  Generally, this is a safe procedure. However, as with any procedure, complications can occur. Possible complications include:  Bleeding.  Tearing or rupture of the colon wall.  Reaction to medicines given during the exam.  Infection (rare). BEFORE THE PROCEDURE   Ask your health care provider about changing or stopping your regular medicines.  You may be prescribed an oral bowel prep. This involves drinking a large amount of medicated liquid, starting the day before your procedure. The liquid will cause you to have multiple loose stools until your stool is almost clear or light green. This cleans out your colon in preparation for the procedure.  Do not eat or drink anything else once you have started the bowel prep, unless your health care provider tells you it is safe to do so.  Arrange for someone to drive you home after the procedure. PROCEDURE   You will be given medicine to help you relax (sedative).  You will lie on your side with your knees bent.  A long, flexible tube with a light and camera on the end (colonoscope) will be inserted through the rectum and into the colon. The camera sends video back to a computer screen as it moves through the colon. The colonoscope also releases carbon dioxide gas to inflate the colon. This helps your health care provider see the area better.  During  the exam, your health care provider may take a small tissue sample (biopsy) to be examined under a microscope if any abnormalities are found.  The exam is finished when the entire colon has been viewed. AFTER THE PROCEDURE   Do not drive for 24 hours after the exam.  You may have a small amount of blood in your stool.  You may pass moderate amounts of gas and have mild abdominal cramping or bloating. This is caused by the gas used to inflate your colon during the exam.  Ask when your test results will be ready and how you will get your results. Make sure you get your test results. Document Released: 12/04/2000 Document Revised: 09/27/2013 Document Reviewed: 08/14/2013 [facility] Patient Information 2015 [place], [place]. This information is not intended to replace advice given to you by your health care provider. Make sure you discuss any questions you have with your health care provider. Esophagogastroduodenoscopy Esophagogastroduodenoscopy (EGD) is a procedure to examine the lining of the esophagus, stomach, and first part of the small intestine (duodenum). A long, flexible, lighted tube with a camera attached (endoscope) is inserted down the throat to view these organs. This procedure is done to detect problems or abnormalities, such as inflammation, bleeding, ulcers, or growths, in order to treat them. The procedure lasts about 5-20 minutes. It is usually an outpatient procedure, but it may need to be performed in emergency cases in the hospital. LET YOUR CAREGIVER KNOW ABOUT:  Allergies to food or medicine. All medicines you are taking, including vitamins, herbs, eyedrops,  and over-the-counter medicines and creams. Use of steroids (by mouth or creams). Previous problems you or members of your family have had with the use of anesthetics. Any blood disorders you have. Previous surgeries you have had. Other health problems you have. Possibility of pregnancy, if this applies. RISKS AND  COMPLICATIONS  Generally, EGD is a safe procedure. However, as with any procedure, complications can occur. Possible complications include: Infection. Bleeding. Tearing (perforation) of the esophagus, stomach, or duodenum. Difficulty breathing or not being able to breath. Excessive sweating. Spasms of the larynx. Slowed heartbeat. Low blood pressure. BEFORE THE PROCEDURE Do not eat or drink anything for 6-8 hours before the procedure or as directed by your caregiver. Ask your caregiver about changing or stopping your regular medicines. If you wear dentures, be prepared to remove them before the procedure. Arrange for someone to drive you home after the procedure. PROCEDURE  A vein will be accessed to give medicines and fluids. A medicine to relax you (sedative) and a pain reliever will be given through that access into the vein. A numbing medicine (local anesthetic) may be sprayed on your throat for comfort and to stop you from gagging or coughing. A mouth guard may be placed in your mouth to protect your teeth and to keep you from biting on the endoscope. You will be asked to lie on your left side. The endoscope is inserted down your throat and into the esophagus, stomach, and duodenum. Air is put through the endoscope to allow your caregiver to view the lining of your esophagus clearly. The esophagus, stomach, and duodenum is then examined. During the exam, your caregiver may: Remove tissue to be examined under a microscope (biopsy) for inflammation, infection, or other medical problems. Remove growths. Remove objects (foreign bodies) that are stuck. Treat any bleeding with medicines or other devices that stop tissues from bleeding (hot cautery, clipping devices). Widen (dilate) or stretch narrowed areas of the esophagus and stomach. The endoscope will then be withdrawn. AFTER THE PROCEDURE You will be taken to a recovery area to be monitored. You will be able to go home once you are  stable and alert. Do not eat or drink anything until the local anesthetic and numbing medicines have worn off. You may choke. It is normal to feel bloated, have pain with swallowing, or have a sore throat for a short time. This will wear off. Your caregiver should be able to discuss his or her findings with you. It will take longer to discuss the test results if any biopsies were taken. Document Released: 04/09/2005 Document Revised: 04/23/2014 Document Reviewed: 11/09/2012 [facility] Patient Information 2015 [place], [place]. This information is not intended to replace advice given to you by your health care provider. Make sure you discuss any questions you have with your health care provider.  Patient has been scheduled for an upper and lower endoscopy on 07-23-15 at [facility].

## 2015-07-04 ENCOUNTER — Inpatient Hospital Stay (HOSPITAL_BASED_OUTPATIENT_CLINIC_OR_DEPARTMENT_OTHER): Payer: BLUE CROSS/BLUE SHIELD | Admitting: Hematology and Oncology

## 2015-07-04 ENCOUNTER — Other Ambulatory Visit: Payer: Self-pay

## 2015-07-04 ENCOUNTER — Inpatient Hospital Stay: Payer: BLUE CROSS/BLUE SHIELD

## 2015-07-04 ENCOUNTER — Encounter: Payer: Self-pay | Admitting: Hematology and Oncology

## 2015-07-04 VITALS — BP 109/69 | HR 53 | Temp 95.6°F | Resp 18 | Ht 68.0 in | Wt 149.9 lb

## 2015-07-04 DIAGNOSIS — C8300 Small cell B-cell lymphoma, unspecified site: Secondary | ICD-10-CM | POA: Diagnosis not present

## 2015-07-04 DIAGNOSIS — D509 Iron deficiency anemia, unspecified: Secondary | ICD-10-CM

## 2015-07-04 DIAGNOSIS — E785 Hyperlipidemia, unspecified: Secondary | ICD-10-CM

## 2015-07-04 DIAGNOSIS — R21 Rash and other nonspecific skin eruption: Secondary | ICD-10-CM

## 2015-07-04 DIAGNOSIS — D589 Hereditary hemolytic anemia, unspecified: Secondary | ICD-10-CM | POA: Diagnosis not present

## 2015-07-04 DIAGNOSIS — D599 Acquired hemolytic anemia, unspecified: Secondary | ICD-10-CM

## 2015-07-04 DIAGNOSIS — Z79899 Other long term (current) drug therapy: Secondary | ICD-10-CM | POA: Diagnosis not present

## 2015-07-04 DIAGNOSIS — Z8719 Personal history of other diseases of the digestive system: Secondary | ICD-10-CM

## 2015-07-04 DIAGNOSIS — C9110 Chronic lymphocytic leukemia of B-cell type not having achieved remission: Secondary | ICD-10-CM

## 2015-07-04 DIAGNOSIS — F17210 Nicotine dependence, cigarettes, uncomplicated: Secondary | ICD-10-CM

## 2015-07-04 LAB — IRON AND TIBC
Iron: 54 ug/dL (ref 45–182)
Saturation Ratios: 24 % (ref 17.9–39.5)
TIBC: 221 ug/dL — ABNORMAL LOW (ref 250–450)
UIBC: 168 ug/dL

## 2015-07-04 LAB — RETICULOCYTES
RBC.: 4.78 MIL/uL (ref 4.40–5.90)
Retic Count, Absolute: 57.4 10*3/uL (ref 19.0–183.0)
Retic Ct Pct: 1.2 % (ref 0.4–3.1)

## 2015-07-04 LAB — LACTATE DEHYDROGENASE: LDH: 131 U/L (ref 98–192)

## 2015-07-04 LAB — FERRITIN: Ferritin: 332 ng/mL (ref 24–336)

## 2015-07-04 LAB — URIC ACID: Uric Acid, Serum: 5.6 mg/dL (ref 4.4–7.6)

## 2015-07-04 NOTE — Progress Notes (Signed)
[facility] day:  07/04/2015  Chief Complaint: [name] is a 64 y.o. male with lymphocytic lymphoma with associated hemolytic anemia who is seen for 1 month assessment  HPI:  The patient was last seen in the [facility] oncology clinic on 06/14/2015.  At that time, he was seen for initial assessment by me.  He has been diagnosed with B-cell CLL/SLL. Presenting with a hemolytic anemia.  He was treated with prednisone, but with taper developed recurrent hemolysis.  He received 4 weeks of Rituxan (113/13/2015-10/23/2014).  His hematocrit normalized.  At last visit, he was noted to have a slowly progressive microcytic anemia over the past 3 months. Diet is good. He denied any melana or hematochezia. EGD and colonoscopy in 2011 revealed gastritis. He was due for a colonoscopy.  Symptomatically, he denied any fevers, sweats, or weight loss or adenopathy.   Symptomatically, he feels fine. His weight is up and down. His appetite is also up and down (approximate 75%).  He describes an itchy after bath rash.  He denies any infections. He denies any adenopathy. He denies any jaundice or change in urine color.  He has diarrhea that comes and goes. He is smoking one cigar a day.  He states that he gets most of his care at the [facility].  Past Medical History  Diagnosis Date  . Hyperlipidemia   . Anemia 2008    Past Surgical History  Procedure Laterality Date  . Lymp node removal Right 2011    arm  . Colonoscopy    . Lymph node biopsy  08/2014    No family history on file.  Social History:  reports that he has been smoking Cigars.  He has never used smokeless tobacco. He reports that he does not drink alcohol or use illicit drugs.  The patient is alone today.  Allergies:  Allergies  Allergen Reactions  . Latex Rash    Current Medications: Current Outpatient Prescriptions  Medication Sig Dispense Refill  . iron polysaccharides (NIFEREX) 150  MG capsule Take 150 mg by mouth daily.    . naproxen sodium (ANAPROX) 220 MG tablet Take 220 mg by mouth as needed.    . pantoprazole (PROTONIX) 40 MG tablet Take 40 mg by mouth daily.    . polyethylene glycol powder (GLYCOLAX/MIRALAX) powder 255 grams one bottle for colonoscopy prep 255 g 0  . pregabalin (LYRICA) 75 MG capsule Take 1 capsule (75 mg total) by mouth 2 (two) times daily. 60 capsule 4  . VIAGRA 50 MG tablet as needed.   0   No current facility-administered medications for this visit.    Review of Systems:  GENERAL: Feels fine.No fevers or sweats.  Weight up and down. PERFORMANCE STATUS (ECOG): 0 HEENT: No visual changes, runny nose, sore throat, mouth sores or tenderness. Lungs: No shortness of breath or cough. No hemoptysis. Cardiac: No chest pain, palpitations, orthopnea, or PND. GI: Appetite up and down. Diarrhea comes and goes.  No nausea, vomiting, constipation, melena or hematochezia. GU: No frequency, dysuria, or hematuria. Needs PSA checked. Musculoskeletal: Legs hurt at times to his feet. Feels a "pressure release after his shoes are off". Inflammatory arthritis. No muscle tenderness. Extremities: No pain or swelling. Skin: Pruritic after bath rash.. Neuro: No headache, numbness or weakness, balance or coordination issues. Endocrine: No diabetes, thyroid issues, hot flashes or night sweats. Psych: No mood changes, depression or anxiety. Pain: No focal pain. Review of systems: All other systems reviewed  and found to be negative  Physical Exam: Blood pressure 109/69, pulse 53, temperature 95.6 F (35.3 C), temperature source Tympanic, resp. rate 18, height 5\' 8"  (1.727 m), weight 149 lb 14.6 oz (68 kg). GENERAL: Well developed, well nourished, gentleman sitting comfortably in the exam room in no acute distress. MENTAL STATUS: Alert and oriented to person, place and time. HEAD: Alopecia. Graying goatee. Normocephalic, atraumatic, face  symmetric, no Cushingoid features. EYES: Brown eyes. Pupils equal round and reactive to light and accomodation. No conjunctivitis or scleral icterus. ENT: Oropharynx clear without lesion. Dentures.  Tongue normal. Mucous membranes moist.  RESPIRATORY: Clear to auscultation without rales, wheezes or rhonchi. CARDIOVASCULAR: Regular rate and rhythm without murmur, rub or gallop. ABDOMEN: Soft, non-tender, with active bowel sounds, and no hepatosplenomegaly. No masses. SKIN: No rashes, ulcers or lesions. EXTREMITIES: No edema, no skin discoloration or tenderness. No palpable cords. LYMPH NODES: No palpable cervical, supraclavicular, axillary or inguinal adenopathy  NEUROLOGICAL: Unremarkable. PSYCH: Appropriate.  Appointment on 07/04/2015  Component Date Value Ref Range Status  . LDH 07/04/2015 131  98 - 192 U/L Final  . Retic Ct Pct 07/04/2015 1.2  0.4 - 3.1 % Final  . RBC. 07/04/2015 4.78  4.40 - 5.90 MIL/uL Final  . Retic Count, Manual 07/04/2015 57.4  19.0 - 183.0 K/uL Final    Assessment:  [name] is a 64 y.o. 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 He was treated with prednisone. With taper of prednisone, his hemolysis returned.  Chest and abdomen CT scan on 02/09/2014 revealed bulky bilateral axillary adenopathy and bulky intraperitoneal and retroperitoneal adenopathy. PET scan on 07/11/2014 revealed mildly hypermetabolic lymph nodes in the chest, abdomen, and pelvis and mildly hypermetabolic spleen.   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 He received 4 weekly cycles of Rituxan (10/02/2014 - 10/23/2014). His hematocrit normalized.  He has slowly progressive microcytic anemia over the past 3 months. He feels that his diet is good. He denies any melana or hematochezia. EGD and colonoscopy in 2011 revealed gastritis. He is due for a  colonoscopy.  Symptomatically, he denies any fevers, sweats, or weight loss or adenopathy.  He has after bath itching.  Exam reveals no palpable adenopathy.  Plan: 1. Labs today:  ferritin, iron studies, LDH, uric acid, retic. 2. Guaiac cards x 3. 3. Encourage follow-up colonoscopy (due). 4. Discuss smoking cessation. 5. Patient to follow-up with PCP regarding Rx for Viagra.  Provide assistance with obtaining a PCP. 6. RTC in 3 months for MD assessment and labs (CBC with diff, CMP, LDH, uric acid, retic).   Lequita Asal, MD  07/04/2015, 3:56 PM

## 2015-07-05 ENCOUNTER — Other Ambulatory Visit: Payer: Self-pay | Admitting: *Deleted

## 2015-07-05 DIAGNOSIS — D509 Iron deficiency anemia, unspecified: Secondary | ICD-10-CM

## 2015-07-05 LAB — OCCULT BLOOD X 1 CARD TO LAB, STOOL
Fecal Occult Bld: NEGATIVE
Fecal Occult Bld: NEGATIVE
Fecal Occult Bld: NEGATIVE

## 2015-07-23 ENCOUNTER — Ambulatory Visit: Payer: BLUE CROSS/BLUE SHIELD | Admitting: Anesthesiology

## 2015-07-23 ENCOUNTER — Encounter: Payer: Self-pay | Admitting: *Deleted

## 2015-07-23 ENCOUNTER — Ambulatory Visit
Admission: RE | Admit: 2015-07-23 | Discharge: 2015-07-23 | Disposition: A | Discharge status: Home / Self Care | Payer: BLUE CROSS/BLUE SHIELD | Source: Ambulatory Visit | Attending: General Surgery | Admitting: General Surgery

## 2015-07-23 ENCOUNTER — Encounter
Admission: RE | Disposition: A | Discharge status: Home / Self Care | Payer: Self-pay | Source: Ambulatory Visit | Attending: General Surgery

## 2015-07-23 DIAGNOSIS — Z79899 Other long term (current) drug therapy: Secondary | ICD-10-CM | POA: Diagnosis not present

## 2015-07-23 DIAGNOSIS — F17290 Nicotine dependence, other tobacco product, uncomplicated: Secondary | ICD-10-CM | POA: Insufficient documentation

## 2015-07-23 DIAGNOSIS — D589 Hereditary hemolytic anemia, unspecified: Secondary | ICD-10-CM | POA: Diagnosis not present

## 2015-07-23 DIAGNOSIS — D509 Iron deficiency anemia, unspecified: Secondary | ICD-10-CM | POA: Insufficient documentation

## 2015-07-23 DIAGNOSIS — N529 Male erectile dysfunction, unspecified: Secondary | ICD-10-CM | POA: Diagnosis not present

## 2015-07-23 DIAGNOSIS — M069 Rheumatoid arthritis, unspecified: Secondary | ICD-10-CM | POA: Diagnosis not present

## 2015-07-23 DIAGNOSIS — C8334 Diffuse large B-cell lymphoma, lymph nodes of axilla and upper limb: Secondary | ICD-10-CM

## 2015-07-23 DIAGNOSIS — Z1211 Encounter for screening for malignant neoplasm of colon: Secondary | ICD-10-CM

## 2015-07-23 DIAGNOSIS — K5730 Diverticulosis of large intestine without perforation or abscess without bleeding: Secondary | ICD-10-CM | POA: Insufficient documentation

## 2015-07-23 DIAGNOSIS — C8580 Other specified types of non-Hodgkin lymphoma, unspecified site: Secondary | ICD-10-CM | POA: Diagnosis not present

## 2015-07-23 HISTORY — PX: COLONOSCOPY WITH PROPOFOL: SHX5780

## 2015-07-23 HISTORY — DX: Unspecified osteoarthritis, unspecified site: M19.90

## 2015-07-23 SURGERY — COLONOSCOPY WITH PROPOFOL
Anesthesia: General

## 2015-07-23 MED ORDER — PROPOFOL INFUSION 10 MG/ML OPTIME
INTRAVENOUS | Status: DC | PRN
  Administered 2015-07-23: 140 ug/kg/min via INTRAVENOUS

## 2015-07-23 MED ORDER — MIDAZOLAM HCL 2 MG/2ML IJ SOLN
INTRAMUSCULAR | Status: DC | PRN
  Administered 2015-07-23: 2 mg via INTRAVENOUS

## 2015-07-23 MED ORDER — LIDOCAINE HCL (CARDIAC) 20 MG/ML IV SOLN
INTRAVENOUS | Status: DC | PRN
  Administered 2015-07-23: 60 mg via INTRAVENOUS

## 2015-07-23 MED ORDER — SODIUM CHLORIDE 0.9 % IV SOLN
INTRAVENOUS | Status: DC
  Administered 2015-07-23: 1000 mL via INTRAVENOUS

## 2015-07-23 NOTE — Transfer of Care (Signed)
Immediate Anesthesia Transfer of Care Note  Patient: [name]  Procedure(s) Performed: Procedure(s): COLONOSCOPY WITH PROPOFOL (N/A)  Patient Location: PACU and Endoscopy Unit  Anesthesia Type:General  Level of Consciousness: sedated  Airway & Oxygen Therapy: Patient Spontanous Breathing and Patient connected to nasal cannula oxygen  Post-op Assessment: Report given to RN and Post -op Vital signs reviewed and stable  Post vital signs: Reviewed and stable  Last Vitals:  Filed Vitals:   07/23/15 1421  BP: 113/69  Pulse: 59  Temp: 36 C  Resp: 15    Complications: No apparent anesthesia complications

## 2015-07-23 NOTE — Anesthesia Postprocedure Evaluation (Signed)
  Anesthesia Post-op Note  Patient: [name]  Procedure(s) Performed: Procedure(s): COLONOSCOPY WITH PROPOFOL (N/A)  Anesthesia type:General  Patient location: PACU  Post pain: Pain level controlled  Post assessment: Post-op Vital signs reviewed, Patient's Cardiovascular Status Stable, Respiratory Function Stable, Patent Airway and No signs of Nausea or vomiting  Post vital signs: Reviewed and stable  Last Vitals:  Filed Vitals:   07/23/15 1451  BP: 124/84  Pulse: 54  Temp:   Resp: 15    Level of consciousness: awake, alert  and patient cooperative  Complications: No apparent anesthesia complications

## 2015-07-23 NOTE — H&P (Signed)
No change in clinical history or exam. For upper and lower endoscopy today.

## 2015-07-23 NOTE — Anesthesia Preprocedure Evaluation (Signed)
Anesthesia Evaluation  Patient identified by MRN, date of birth, ID band Patient awake    Reviewed: Allergy & Precautions, H&P , NPO status , Patient's Chart, lab work & pertinent test results, reviewed documented beta blocker date and time   History of Anesthesia Complications Negative for: history of anesthetic complications  Airway Mallampati: I  TM Distance: >3 FB Neck ROM: full    Dental no notable dental hx. (+) Edentulous Upper, Edentulous Lower, Upper Dentures, Lower Dentures   Pulmonary neg shortness of breath, neg COPDneg recent URI, Current Smoker,  breath sounds clear to auscultation  Pulmonary exam normal       Cardiovascular Exercise Tolerance: Good negative cardio ROS Normal cardiovascular examRhythm:regular Rate:Normal     Neuro/Psych negative neurological ROS  negative psych ROS   GI/Hepatic negative GI ROS, Neg liver ROS,   Endo/Other  negative endocrine ROS  Renal/GU negative Renal ROS  negative genitourinary   Musculoskeletal   Abdominal   Peds  Hematology negative hematology ROS (+)   Anesthesia Other Findings Past Medical History:   Hyperlipidemia                                               Anemia                                          2008         Arthritis                                                    Reproductive/Obstetrics negative OB ROS                             Anesthesia Physical Anesthesia Plan  ASA: II  Anesthesia Plan: General   Post-op Pain Management:    Induction:   Airway Management Planned:   Additional Equipment:   Intra-op Plan:   Post-operative Plan:   Informed Consent: I have reviewed the patients History and Physical, chart, labs and discussed the procedure including the risks, benefits and alternatives for the proposed anesthesia with the patient or authorized representative who has indicated his/her understanding and  acceptance.   Dental Advisory Given  Plan Discussed with: Anesthesiologist, CRNA and Surgeon  Anesthesia Plan Comments:         Anesthesia Quick Evaluation

## 2015-07-23 NOTE — Op Note (Signed)
[facility] Patient Name: [name] Procedure Date: 07/23/2015 1:57 PM MRN: [id number] Account #: [id number] Date of Birth: [birth year] Admit Type: Outpatient Age: 64 Room: [facility] ENDO ROOM 1 Gender: Male Note Status: Finalized Procedure:         Colonoscopy Indications:       Iron deficiency anemia Providers:         [name], MD Referring MD:      [facility]arton Dubois (Referring MD) Medicines:         Monitored Anesthesia Care Complications:     No immediate complications. Procedure:         Pre-Anesthesia Assessment:                    - Prior to the procedure, a History and Physical was                     performed, and patient medications, allergies and                     sensitivities were reviewed. The patient's tolerance of                     previous anesthesia was reviewed.                    - The risks and benefits of the procedure and the sedation                     options and risks were discussed with the patient. All                     questions were answered and informed consent was obtained.                    After obtaining informed consent, the colonoscope was                     passed under direct vision. Throughout the procedure, the                     patient's blood pressure, pulse, and oxygen saturations                     were monitored continuously. The Colonoscope was                     introduced through the anus and advanced to the the cecum,                     identified by appendiceal orifice and ileocecal valve. The                     colonoscopy was performed without difficulty. The patient                     tolerated the procedure well. The quality of the bowel                     preparation was excellent. Findings:      A few medium-mouthed diverticula were found in the sigmoid colon.      The retroflexed view of the distal rectum and anal verge was normal and       showed no  anal or rectal abnormalities.  Impression:        - Diverticulosis in the sigmoid colon.                    - The distal rectum and anal verge are normal on                     retroflexion view.                    - No specimens collected. Recommendation:    - Return to endoscopist in 2 weeks. Procedure Code(s): --- Professional ---                    [phone], Colonoscopy, flexible; diagnostic, including                     collection of specimen(s) by brushing or washing, when                     performed (separate procedure) Diagnosis Code(s): --- Professional ---                    D50.9, Iron deficiency anemia, unspecified                    K57.30, Diverticulosis of large intestine without                     perforation or abscess without bleeding CPT copyright 2014 [employer]. All rights reserved. The codes documented in this report are preliminary and upon coder review may  be revised to meet current compliance requirements. [name], MD 07/23/2015 2:25:21 PM This report has been signed electronically. Number of Addenda: 0 Note Initiated On: 07/23/2015 1:57 PM Scope Withdrawal Time: 0 hours 6 minutes 55 seconds  Total Procedure Duration: 0 hours 16 minutes 43 seconds       [facility]

## 2015-07-25 ENCOUNTER — Encounter: Payer: Self-pay | Admitting: General Surgery

## 2015-08-06 ENCOUNTER — Encounter: Payer: Self-pay | Admitting: General Surgery

## 2015-08-06 ENCOUNTER — Ambulatory Visit (INDEPENDENT_AMBULATORY_CARE_PROVIDER_SITE_OTHER): Payer: BLUE CROSS/BLUE SHIELD | Admitting: General Surgery

## 2015-08-06 VITALS — BP 118/74 | HR 70 | Resp 14 | Ht 68.0 in | Wt 145.0 lb

## 2015-08-06 DIAGNOSIS — D649 Anemia, unspecified: Secondary | ICD-10-CM

## 2015-08-06 NOTE — Patient Instructions (Addendum)
Advise to avoid soap to the rectum, may use wipes . The patient is aware to call back for any questions or concerns. Esophagogastroduodenoscopy Esophagogastroduodenoscopy (EGD) is a procedure to examine the lining of the esophagus, stomach, and first part of the small intestine (duodenum). A long, flexible, lighted tube with a camera attached (endoscope) is inserted down the throat to view these organs. This procedure is done to detect problems or abnormalities, such as inflammation, bleeding, ulcers, or growths, in order to treat them. The procedure lasts about 5-20 minutes. It is usually an outpatient procedure, but it may need to be performed in emergency cases in the hospital. LET YOUR CAREGIVER KNOW ABOUT:   Allergies to food or medicine.  All medicines you are taking, including vitamins, herbs, eyedrops, and over-the-counter medicines and creams.  Use of steroids (by mouth or creams).  Previous problems you or members of your family have had with the use of anesthetics.  Any blood disorders you have.  Previous surgeries you have had.  Other health problems you have.  Possibility of pregnancy, if this applies. RISKS AND COMPLICATIONS  Generally, EGD is a safe procedure. However, as with any procedure, complications can occur. Possible complications include:  Infection.  Bleeding.  Tearing (perforation) of the esophagus, stomach, or duodenum.  Difficulty breathing or not being able to breath.  Excessive sweating.  Spasms of the larynx.  Slowed heartbeat.  Low blood pressure. BEFORE THE PROCEDURE  Do not eat or drink anything for 6-8 hours before the procedure or as directed by your caregiver.  Ask your caregiver about changing or stopping your regular medicines.  If you wear dentures, be prepared to remove them before the procedure.  Arrange for someone to drive you home after the procedure. PROCEDURE   A vein will be accessed to give medicines and fluids. A  medicine to relax you (sedative) and a pain reliever will be given through that access into the vein.  A numbing medicine (local anesthetic) may be sprayed on your throat for comfort and to stop you from gagging or coughing.  A mouth guard may be placed in your mouth to protect your teeth and to keep you from biting on the endoscope.  You will be asked to lie on your left side.  The endoscope is inserted down your throat and into the esophagus, stomach, and duodenum.  Air is put through the endoscope to allow your caregiver to view the lining of your esophagus clearly.  The esophagus, stomach, and duodenum is then examined. During the exam, your caregiver may:  Remove tissue to be examined under a microscope (biopsy) for inflammation, infection, or other medical problems.  Remove growths.  Remove objects (foreign bodies) that are stuck.  Treat any bleeding with medicines or other devices that stop tissues from bleeding (hot cautery, clipping devices).  Widen (dilate) or stretch narrowed areas of the esophagus and stomach.  The endoscope will then be withdrawn. AFTER THE PROCEDURE  You will be taken to a recovery area to be monitored. You will be able to go home once you are stable and alert.  Do not eat or drink anything until the local anesthetic and numbing medicines have worn off. You may choke.  It is normal to feel bloated, have pain with swallowing, or have a sore throat for a short time. This will wear off.  Your caregiver should be able to discuss his or her findings with you. It will take longer to discuss the test results if  any biopsies were taken. Document Released: 04/09/2005 Document Revised: 04/23/2014 Document Reviewed: 11/09/2012 [facility] Patient Information 2015 [place], [place]. This information is not intended to replace advice given to you by your health care provider. Make sure you discuss any questions you have with your health care provider.  Patient is  scheduled for an upper endoscopy at [facility] on 08/20/15. Patient is aware of date and all instructions.

## 2015-08-06 NOTE — Progress Notes (Signed)
Patient ID: [name], male   DOB: [birth year], 64 y.o.   MRN: [id number]  Chief Complaint  Patient presents with  . Routine Post Op    HPI [name] is a 64 y.o. male.  Here today postoperative, colonoscopy done 07-23-15. He states he has had some rectal itching and burning since the colonoscopy. He has been using Vaseline. He states this has happened in the past.  The patient does use soap and a washcloth for perianal cleansing. 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 He does admit to some weight loss, few pounds, and poor appetite. He would like to gain some weight.  HPI  Past Medical History  Diagnosis Date  . Hyperlipidemia   . Anemia 2008  . Arthritis     Past Surgical History  Procedure Laterality Date  . Lymp node removal Right 2011    arm  . Colonoscopy    . Lymph node biopsy  08/2014  . Colonoscopy with propofol N/A 07/23/2015    Procedure: COLONOSCOPY WITH PROPOFOL;  Surgeon: [name], MD;  Location: [facility] ENDOSCOPY;  Service: Endoscopy;  Laterality: N/A;    No family history on file.  Social History Social History  Substance Use Topics  . Smoking status: Current Every Day Smoker    Types: Cigars  . Smokeless tobacco: Never Used  . Alcohol Use: No    Allergies  Allergen Reactions  . Latex Rash    Current Outpatient Prescriptions  Medication Sig Dispense Refill  . iron polysaccharides (NIFEREX) 150 MG capsule Take 150 mg by mouth daily.    . naproxen sodium (ANAPROX) 220 MG tablet Take 220 mg by mouth as needed.    . pantoprazole (PROTONIX) 40 MG tablet Take 40 mg by mouth daily.    . pregabalin (LYRICA) 75 MG capsule Take 1 capsule (75 mg total) by mouth 2 (two) times daily. 60 capsule 4  . VIAGRA 50 MG tablet as needed.   0   No current facility-administered medications for this visit.    Review of Systems Review of Systems  Constitutional: Positive for appetite change.  Respiratory: Negative.   Cardiovascular: Negative.     Blood  pressure 118/74, pulse 70, resp. rate 14, height 5\' 8"  (1.727 m), weight 145 lb (65.772 kg).  Physical Exam Physical Exam  Constitutional: He is oriented to person, place, and time. He appears well-developed and well-nourished.  Eyes: Conjunctivae are normal. No scleral icterus.  Neck: Neck supple.  Cardiovascular: Normal rate, regular rhythm and normal heart sounds.   Pulmonary/Chest: Effort normal and breath sounds normal.  Neurological: He is alert and oriented to person, place, and time.  Skin: Skin is warm and dry.  Psychiatric: His behavior is normal.    Data Reviewed Recently Completed colonoscopy was normal. Recently completed CBC showed a hemoglobin 11.7 with an MCV of 77. MCV is fallen points in the last 10 months.  Assessment    Mild anemia, history CLL.    Plan    Original plans were for an upper endoscopy at the time of his recent colonoscopy as documented on his 07/03/2015 office note. At time of presentation to endoscopy there was some confusion and the patient decided not to proceed at that time. Now that he has had a normal colonoscopy he is anxious to have the upper endoscopy completed.  The perianal discomfort is likely irritation from soap application, and he was encouraged to use only the hand-held showerhead for cleansing. He may make use of OTC hydrocortisone  cream if desired.    Advise to avoid soap to the rectum, may use wipes . Discussed risk and benefits of upper endoscopy.  Patient is scheduled for an upper endoscopy at [facility] on 08/20/15. Patient is aware of date and all instructions.   PCP:  No Pcp   [name] 08/07/2015, 4:01 PM

## 2015-08-07 NOTE — H&P (Signed)
Patient ID: [name], male DOB: [birth year], 64 y.o. MRN: [id number]  Chief Complaint   Patient presents with   .  Routine Post Op    HPI  [name] is a 64 y.o. male. Here today postoperative, colonoscopy done 07-23-15. He states he has had some rectal itching and burning since the colonoscopy. He has been using Vaseline. He states this has happened in the past.  The patient does use soap and a washcloth for perianal cleansing.  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  He does admit to some weight loss, few pounds, and poor appetite. He would like to gain some weight.  HPI  Past Medical History   Diagnosis  Date   .  Hyperlipidemia    .  Anemia  2008   .  Arthritis     Past Surgical History   Procedure  Laterality  Date   .  Lymp node removal  Right  2011     arm   .  Colonoscopy     .  Lymph node biopsy   08/2014   .  Colonoscopy with propofol  N/A  07/23/2015     Procedure: COLONOSCOPY WITH PROPOFOL; Surgeon: [name], MD; Location: [facility] ENDOSCOPY; Service: Endoscopy; Laterality: N/A;    No family history on file.  Social History  Social History   Substance Use Topics   .  Smoking status:  Current Every Day Smoker     Types:  Cigars   .  Smokeless tobacco:  Never Used   .  Alcohol Use:  No    Allergies   Allergen  Reactions   .  Latex  Rash    Current Outpatient Prescriptions   Medication  Sig  Dispense  Refill   .  iron polysaccharides (NIFEREX) 150 MG capsule  Take 150 mg by mouth daily.     .  naproxen sodium (ANAPROX) 220 MG tablet  Take 220 mg by mouth as needed.     .  pantoprazole (PROTONIX) 40 MG tablet  Take 40 mg by mouth daily.     .  pregabalin (LYRICA) 75 MG capsule  Take 1 capsule (75 mg total) by mouth 2 (two) times daily.  60 capsule  4   .  VIAGRA 50 MG tablet  as needed.   0    No current facility-administered medications for this visit.    Review of Systems  Review of Systems  Constitutional: Positive for appetite change.   Respiratory: Negative.  Cardiovascular: Negative.   Blood pressure 118/74, pulse 70, resp. rate 14, height 5\' 8"  (1.727 m), weight 145 lb (65.772 kg).  Physical Exam  Physical Exam  Constitutional: He is oriented to person, place, and time. He appears well-developed and well-nourished.  Eyes: Conjunctivae are normal. No scleral icterus.  Neck: Neck supple.  Cardiovascular: Normal rate, regular rhythm and normal heart sounds.  Pulmonary/Chest: Effort normal and breath sounds normal.  Neurological: He is alert and oriented to person, place, and time.  Skin: Skin is warm and dry.  Psychiatric: His behavior is normal.   Data Reviewed  Recently Completed colonoscopy was normal.  Recently completed CBC showed a hemoglobin 11.7 with an MCV of 77. MCV is fallen points in the last 10 months.  Assessment   Mild anemia, history CLL.   Plan   Original plans were for an upper endoscopy at the time of his recent colonoscopy as documented on his 07/03/2015 office note. At time of presentation to endoscopy  there was some confusion and the patient decided not to proceed at that time. Now that he has had a normal colonoscopy he is anxious to have the upper endoscopy completed.  The perianal discomfort is likely irritation from soap application, and he was encouraged to use only the hand-held showerhead for cleansing. He may make use of OTC hydrocortisone cream if desired.   Advise to avoid soap to the rectum, may use wipes .  Discussed risk and benefits of upper endoscopy.  Patient is scheduled for an upper endoscopy at [facility] on 08/20/15. Patient is aware of date and all instructions.  PCP: No Pcp  [name]  08/07/2015, 4:01 PM

## 2015-08-19 ENCOUNTER — Encounter: Payer: Self-pay | Admitting: *Deleted

## 2015-08-20 ENCOUNTER — Ambulatory Visit
Admission: RE | Admit: 2015-08-20 | Discharge: 2015-08-20 | Disposition: A | Discharge status: Home / Self Care | Payer: BLUE CROSS/BLUE SHIELD | Source: Ambulatory Visit | Attending: General Surgery | Admitting: General Surgery

## 2015-08-20 ENCOUNTER — Ambulatory Visit: Payer: BLUE CROSS/BLUE SHIELD | Admitting: Certified Registered Nurse Anesthetist

## 2015-08-20 ENCOUNTER — Encounter
Admission: RE | Disposition: A | Discharge status: Home / Self Care | Payer: Self-pay | Source: Ambulatory Visit | Attending: General Surgery

## 2015-08-20 DIAGNOSIS — D591 Other autoimmune hemolytic anemias: Secondary | ICD-10-CM | POA: Diagnosis not present

## 2015-08-20 DIAGNOSIS — Z79899 Other long term (current) drug therapy: Secondary | ICD-10-CM | POA: Diagnosis not present

## 2015-08-20 DIAGNOSIS — I739 Peripheral vascular disease, unspecified: Secondary | ICD-10-CM | POA: Insufficient documentation

## 2015-08-20 DIAGNOSIS — D509 Iron deficiency anemia, unspecified: Secondary | ICD-10-CM

## 2015-08-20 DIAGNOSIS — R531 Weakness: Secondary | ICD-10-CM | POA: Insufficient documentation

## 2015-08-20 DIAGNOSIS — M069 Rheumatoid arthritis, unspecified: Secondary | ICD-10-CM | POA: Insufficient documentation

## 2015-08-20 DIAGNOSIS — R0602 Shortness of breath: Secondary | ICD-10-CM | POA: Diagnosis not present

## 2015-08-20 DIAGNOSIS — M359 Systemic involvement of connective tissue, unspecified: Secondary | ICD-10-CM | POA: Diagnosis not present

## 2015-08-20 DIAGNOSIS — F17290 Nicotine dependence, other tobacco product, uncomplicated: Secondary | ICD-10-CM | POA: Diagnosis not present

## 2015-08-20 DIAGNOSIS — R630 Anorexia: Secondary | ICD-10-CM | POA: Insufficient documentation

## 2015-08-20 HISTORY — PX: ESOPHAGOGASTRODUODENOSCOPY (EGD) WITH PROPOFOL: SHX5813

## 2015-08-20 SURGERY — ESOPHAGOGASTRODUODENOSCOPY (EGD) WITH PROPOFOL
Anesthesia: General

## 2015-08-20 MED ORDER — PROPOFOL INFUSION 10 MG/ML OPTIME
INTRAVENOUS | Status: DC | PRN
  Administered 2015-08-20: 120 ug/kg/min via INTRAVENOUS

## 2015-08-20 MED ORDER — LIDOCAINE HCL (CARDIAC) 20 MG/ML IV SOLN
INTRAVENOUS | Status: DC | PRN
  Administered 2015-08-20: 100 mg via INTRAVENOUS

## 2015-08-20 MED ORDER — MIDAZOLAM HCL 2 MG/2ML IJ SOLN
INTRAMUSCULAR | Status: DC | PRN
  Administered 2015-08-20: 1 mg via INTRAVENOUS

## 2015-08-20 MED ORDER — GLYCOPYRROLATE 0.2 MG/ML IJ SOLN
INTRAMUSCULAR | Status: DC | PRN
  Administered 2015-08-20: 0.2 mg via INTRAVENOUS

## 2015-08-20 MED ORDER — SODIUM CHLORIDE 0.9 % IV SOLN
INTRAVENOUS | Status: DC
Start: 2015-08-20 — End: 2015-08-20
  Administered 2015-08-20: 1000 mL via INTRAVENOUS

## 2015-08-20 MED ORDER — FENTANYL CITRATE (PF) 100 MCG/2ML IJ SOLN
INTRAMUSCULAR | Status: DC | PRN
  Administered 2015-08-20: 50 ug via INTRAVENOUS

## 2015-08-20 MED ORDER — PROPOFOL 10 MG/ML IV BOLUS
INTRAVENOUS | Status: DC | PRN
  Administered 2015-08-20: 30 mg via INTRAVENOUS

## 2015-08-20 NOTE — H&P (Signed)
For EGD to assess microcytic indices. Lungs: Clear. Cardio: RR.

## 2015-08-20 NOTE — Anesthesia Preprocedure Evaluation (Signed)
Anesthesia Evaluation  Patient identified by MRN, date of birth, ID band Patient awake    Reviewed: Allergy & Precautions, NPO status , Patient's Chart, lab work & pertinent test results  Airway Mallampati: II       Dental no notable dental hx.    Pulmonary Current Smoker,    + decreased breath sounds      Cardiovascular negative cardio ROS Normal cardiovascular exam    Neuro/Psych    GI/Hepatic negative GI ROS, Neg liver ROS,   Endo/Other  negative endocrine ROS  Renal/GU negative Renal ROS     Musculoskeletal   Abdominal Normal abdominal exam  (+)   Peds negative pediatric ROS (+)  Hematology  (+) anemia ,   Anesthesia Other Findings   Reproductive/Obstetrics                             Anesthesia Physical Anesthesia Plan  ASA: II  Anesthesia Plan: General   Post-op Pain Management:    Induction: Intravenous  Airway Management Planned: Nasal Cannula  Additional Equipment:   Intra-op Plan:   Post-operative Plan:   Informed Consent: I have reviewed the patients History and Physical, chart, labs and discussed the procedure including the risks, benefits and alternatives for the proposed anesthesia with the patient or authorized representative who has indicated his/her understanding and acceptance.     Plan Discussed with: CRNA  Anesthesia Plan Comments:         Anesthesia Quick Evaluation

## 2015-08-20 NOTE — Anesthesia Procedure Notes (Signed)
Performed by: Meygan Kyser Pre-anesthesia Checklist: Patient identified, Emergency Drugs available, Suction available, Timeout performed and Patient being monitored Patient Re-evaluated:Patient Re-evaluated prior to inductionOxygen Delivery Method: Nasal cannula Intubation Type: IV induction       

## 2015-08-20 NOTE — Anesthesia Postprocedure Evaluation (Signed)
  Anesthesia Post-op Note  Patient: [name]  Procedure(s) Performed: Procedure(s): ESOPHAGOGASTRODUODENOSCOPY (EGD) WITH PROPOFOL (N/A)  Anesthesia type:General  Patient location: PACU  Post pain: Pain level controlled  Post assessment: Post-op Vital signs reviewed, Patient's Cardiovascular Status Stable, Respiratory Function Stable, Patent Airway and No signs of Nausea or vomiting  Post vital signs: Reviewed and stable  Last Vitals:  Filed Vitals:   08/20/15 1358  BP: 92/77  Pulse: 54  Temp: 36.6 C  Resp: 15    Level of consciousness: awake, alert  and patient cooperative  Complications: No apparent anesthesia complications

## 2015-08-20 NOTE — Transfer of Care (Signed)
Immediate Anesthesia Transfer of Care Note  Patient: [name]  Procedure(s) Performed: Procedure(s): ESOPHAGOGASTRODUODENOSCOPY (EGD) WITH PROPOFOL (N/A)  Patient Location: PACU  Anesthesia Type:General  Level of Consciousness: sedated  Airway & Oxygen Therapy: Patient Spontanous Breathing and Patient connected to nasal cannula oxygen  Post-op Assessment: Report given to RN and Post -op Vital signs reviewed and stable  Post vital signs: Reviewed and stable  Last Vitals:  Filed Vitals:   08/20/15 1306  BP: 103/83  Pulse: 60  Temp: 37 C    Complications: No apparent anesthesia complications

## 2015-08-20 NOTE — Op Note (Signed)
[facility] Patient Name: [name] Procedure Date: 08/20/2015 1:37 PM MRN: [id number] Account #: [id number] Date of Birth: 01/18/1951 Admit Type: Outpatient Age: 64 Room: [facility] ENDO ROOM 4 Gender: Male Note Status: Finalized Procedure:         Upper GI endoscopy Indications:       Iron deficiency anemia Providers:         [name], MD Medicines:         Monitored Anesthesia Care Complications:     No immediate complications. Procedure:         Pre-Anesthesia Assessment:                    - Prior to the procedure, a History and Physical was                     performed, and patient medications, allergies and                     sensitivities were reviewed. The patient's tolerance of                     previous anesthesia was reviewed.                    - The risks and benefits of the procedure and the sedation                     options and risks were discussed with the patient. All                     questions were answered and informed consent was obtained.                    After obtaining informed consent, the endoscope was passed                     under direct vision. Throughout the procedure, the                     patient's blood pressure, pulse, and oxygen saturations                     were monitored continuously. The Endoscope was introduced                     through the mouth, and advanced to the second part of                     duodenum. The upper GI endoscopy was accomplished without                     difficulty. The patient tolerated the procedure well. Findings:      The esophagus was normal.      The stomach was normal.      The examined duodenum was normal. Impression:        - Normal esophagus.                    - Normal stomach.                    - Normal examined duodenum.                    - No specimens collected. Recommendation:    -  Discharge patient to home (via wheelchair). Procedure  Code(s): --- Professional ---                    [phone], Esophagogastroduodenoscopy, flexible, transoral;                     diagnostic, including collection of specimen(s) by                     brushing or washing, when performed (separate procedure) Diagnosis Code(s): --- Professional ---                    D50.9, Iron deficiency anemia, unspecified CPT copyright 2014 [employer]. All rights reserved. The codes documented in this report are preliminary and upon coder review may  be revised to meet current compliance requirements. [name], MD 08/20/2015 1:54:27 PM This report has been signed electronically. Number of Addenda: 0 Note Initiated On: 08/20/2015 1:37 PM      [facility]

## 2015-08-21 ENCOUNTER — Encounter: Payer: Self-pay | Admitting: General Surgery

## 2015-09-02 ENCOUNTER — Telehealth: Payer: Self-pay | Admitting: *Deleted

## 2015-09-02 NOTE — Telephone Encounter (Signed)
Wanting to give patient Hepatitis B vaccine and is asking if he will even build an immunity being on Humira, blood transfusions, prednisone use. Reports that they called Dr Nobie Putnam office to inquire, but hew has not been seen there since 11/2014 and they advised her to call [facility] for recommendations

## 2015-09-03 NOTE — Telephone Encounter (Signed)
  Tried to call Mr Turnbough.  No answer.  No voicemail.    I was unaware that he was on Humira.  Can cause lymphoma. He has a history of lymphocytic lymphoma. I would prefer that he not get this drug.  Lequita Asal, MD

## 2015-09-05 NOTE — Telephone Encounter (Signed)
  Hep B vaccine is ok to give.  M

## 2015-09-05 NOTE — Telephone Encounter (Signed)
I left message for Sonia Baller ok to give vaccine

## 2015-09-05 NOTE — Telephone Encounter (Signed)
I think he has taken it in the past, there is nothing on his [facility] chart about being on it currently. Can you advise if he should get Hep B vaccine?

## 2015-10-01 ENCOUNTER — Other Ambulatory Visit: Payer: Self-pay

## 2015-10-01 DIAGNOSIS — C8334 Diffuse large B-cell lymphoma, lymph nodes of axilla and upper limb: Secondary | ICD-10-CM

## 2015-10-04 ENCOUNTER — Inpatient Hospital Stay: Payer: BLUE CROSS/BLUE SHIELD | Attending: Hematology and Oncology

## 2015-10-04 ENCOUNTER — Inpatient Hospital Stay (HOSPITAL_BASED_OUTPATIENT_CLINIC_OR_DEPARTMENT_OTHER): Payer: BLUE CROSS/BLUE SHIELD | Admitting: Hematology and Oncology

## 2015-10-04 VITALS — BP 120/75 | HR 64 | Temp 95.3°F | Resp 18 | Ht 68.0 in | Wt 144.5 lb

## 2015-10-04 DIAGNOSIS — R197 Diarrhea, unspecified: Secondary | ICD-10-CM | POA: Diagnosis not present

## 2015-10-04 DIAGNOSIS — C8300 Small cell B-cell lymphoma, unspecified site: Secondary | ICD-10-CM | POA: Insufficient documentation

## 2015-10-04 DIAGNOSIS — M549 Dorsalgia, unspecified: Secondary | ICD-10-CM

## 2015-10-04 DIAGNOSIS — E785 Hyperlipidemia, unspecified: Secondary | ICD-10-CM

## 2015-10-04 DIAGNOSIS — D649 Anemia, unspecified: Secondary | ICD-10-CM

## 2015-10-04 DIAGNOSIS — R634 Abnormal weight loss: Secondary | ICD-10-CM | POA: Diagnosis not present

## 2015-10-04 DIAGNOSIS — K5730 Diverticulosis of large intestine without perforation or abscess without bleeding: Secondary | ICD-10-CM | POA: Diagnosis not present

## 2015-10-04 DIAGNOSIS — Z79899 Other long term (current) drug therapy: Secondary | ICD-10-CM | POA: Diagnosis not present

## 2015-10-04 DIAGNOSIS — D589 Hereditary hemolytic anemia, unspecified: Secondary | ICD-10-CM | POA: Insufficient documentation

## 2015-10-04 DIAGNOSIS — Z87891 Personal history of nicotine dependence: Secondary | ICD-10-CM | POA: Insufficient documentation

## 2015-10-04 DIAGNOSIS — M129 Arthropathy, unspecified: Secondary | ICD-10-CM

## 2015-10-04 DIAGNOSIS — C8334 Diffuse large B-cell lymphoma, lymph nodes of axilla and upper limb: Secondary | ICD-10-CM

## 2015-10-04 DIAGNOSIS — C9110 Chronic lymphocytic leukemia of B-cell type not having achieved remission: Secondary | ICD-10-CM

## 2015-10-04 LAB — CBC WITH DIFFERENTIAL/PLATELET
Basophils Absolute: 0.1 10*3/uL (ref 0–0.1)
Basophils Relative: 0 %
Eosinophils Absolute: 0.5 10*3/uL (ref 0–0.7)
Eosinophils Relative: 2 %
HCT: 38.5 % — ABNORMAL LOW (ref 40.0–52.0)
Hemoglobin: 12.1 g/dL — ABNORMAL LOW (ref 13.0–18.0)
Lymphocytes Relative: 76 %
Lymphs Abs: 17.2 10*3/uL — ABNORMAL HIGH (ref 1.0–3.6)
MCH: 23.7 pg — ABNORMAL LOW (ref 26.0–34.0)
MCHC: 31.6 g/dL — ABNORMAL LOW (ref 32.0–36.0)
MCV: 75 fL — ABNORMAL LOW (ref 80.0–100.0)
Monocytes Absolute: 0.7 10*3/uL (ref 0.2–1.0)
Monocytes Relative: 3 %
Neutro Abs: 4.2 10*3/uL (ref 1.4–6.5)
Neutrophils Relative %: 19 %
Platelets: 220 10*3/uL (ref 150–440)
RBC: 5.13 MIL/uL (ref 4.40–5.90)
RDW: 15.7 % — ABNORMAL HIGH (ref 11.5–14.5)
WBC: 22.7 10*3/uL — ABNORMAL HIGH (ref 3.8–10.6)

## 2015-10-04 LAB — COMPREHENSIVE METABOLIC PANEL
ALT: 16 U/L — ABNORMAL LOW (ref 17–63)
AST: 25 U/L (ref 15–41)
Albumin: 4.2 g/dL (ref 3.5–5.0)
Alkaline Phosphatase: 51 U/L (ref 38–126)
Anion gap: 4 — ABNORMAL LOW (ref 5–15)
BUN: 14 mg/dL (ref 6–20)
CO2: 27 mmol/L (ref 22–32)
Calcium: 8.5 mg/dL — ABNORMAL LOW (ref 8.9–10.3)
Chloride: 102 mmol/L (ref 101–111)
Creatinine, Ser: 1.12 mg/dL (ref 0.61–1.24)
GFR calc Af Amer: >60 mL/min (ref >60)
GFR calc non Af Amer: >60 mL/min (ref >60)
Glucose, Bld: 104 mg/dL — ABNORMAL HIGH (ref 65–99)
Potassium: 4.1 mmol/L (ref 3.5–5.1)
Sodium: 133 mmol/L — ABNORMAL LOW (ref 135–145)
Total Bilirubin: 1 mg/dL (ref 0.3–1.2)
Total Protein: 8 g/dL (ref 6.5–8.1)

## 2015-10-04 LAB — RETICULOCYTES
RBC.: 5.13 MIL/uL (ref 4.40–5.90)
Retic Count, Absolute: 61.6 10*3/uL (ref 19.0–183.0)
Retic Ct Pct: 1.2 % (ref 0.4–3.1)

## 2015-10-04 LAB — URIC ACID: Uric Acid, Serum: 5.4 mg/dL (ref 4.4–7.6)

## 2015-10-04 LAB — LACTATE DEHYDROGENASE: LDH: 130 U/L (ref 98–192)

## 2015-10-04 NOTE — Progress Notes (Signed)
[facility] day:  10/04/2015   Chief Complaint: [name] is a 64 y.o. male with lymphocytic lymphoma with associated hemolytic anemia seen for 3 month assessment.  HPI:  The patient was last seen in the medical onocology clinic on 07/04/2015.  At that time, he felt fine.  His weight was up and down.[name]  He denied any B symptoms.  Exam revealed no palpable adenopathy.  He has a slowly progressive microcytic anemia consistent with iron deficiency.  He e denied any melena or hematochezia.  He was due for a colonoscopy.  He noted after bath itching.  LDH, uric acid, and retic were normal.  Ferritin was 332.  Iron saturation was 24%.  Since last visit, he underwent EGD and colonoscopy by Dr. Bary Castilla.  Colonoscopy on 07/23/2015 revealed diverticulosis in the sigmoid colon.  EGD on 08/20/2015 was normal.  Guaiac cards x 3 were negative in 06/2015.  Symptomatically, he notes that his weight continues to be up and down in association with his appetite. It is mostly down.  He eats potatoes, broccoli, hot dogs, hamburgers, salad and vegetables. He notes a little bit of pain in his back (chronic). He has had diarrhea x 3 weeks (3 bowel movements/day).  He feels that something is wrong. He denies any fevers or sweats.  He has not gotten a primary care provider.   Past Medical History  Diagnosis Date  . Hyperlipidemia   . Anemia 2008  . Arthritis     Past Surgical History  Procedure Laterality Date  . Lymp node removal Right 2011    arm  . Colonoscopy    . Lymph node biopsy  08/2014  . Colonoscopy with propofol N/A 07/23/2015    Procedure: COLONOSCOPY WITH PROPOFOL;  Surgeon: [name], MD;  Location: [facility] ENDOSCOPY;  Service: Endoscopy;  Laterality: N/A;  . Esophagogastroduodenoscopy (egd) with propofol N/A 08/20/2015    Procedure: ESOPHAGOGASTRODUODENOSCOPY (EGD) WITH PROPOFOL;  Surgeon: [name], MD;  Location: [facility] ENDOSCOPY;  Service:  Endoscopy;  Laterality: N/A;    No family history on file.  Social History:  reports that he has quit smoking. His smoking use included Cigars. He has never used smokeless tobacco. He reports that he does not drink alcohol or use illicit drugs.  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 The patient is alone today.  Allergies:  Allergies  Allergen Reactions  . Latex Rash    Current Medications: Current Outpatient Prescriptions  Medication Sig Dispense Refill  . iron polysaccharides (NIFEREX) 150 MG capsule Take 150 mg by mouth daily.    . naproxen sodium (ANAPROX) 220 MG tablet Take 220 mg by mouth as needed.    . pantoprazole (PROTONIX) 40 MG tablet Take 40 mg by mouth daily.    [name] VIAGRA 50 MG tablet as needed.   0   No current facility-administered medications for this visit.    Review of Systems:  GENERAL:  Notes "better days".  No fevers or sweats.  Weight loss of 5 pounds.[name] PERFORMANCE STATUS (ECOG):  0 HEENT:  No visual changes, runny nose, sore throat, mouth sores or tenderness. Lungs: No shortness of breath or cough.  No hemoptysis. Cardiac:  No chest pain, palpitations, orthopnea, or PND. GI:  Up and down appetite. Diarrhea x 3 weeks.  No nausea, vomiting, constipation, melena or hematochezia. GU:  Urgency.  No frequency, dysuria, or hematuria.  Needs PSA checked.  Musculoskeletal:  Back pain.  Arthritis.  No muscle tenderness. Extremities:  No pain or swelling. Skin:  No rashes or skin changes. Neuro:  No headache, numbness or weakness, balance or coordination issues. Endocrine:  No diabetes, thyroid issues, hot flashes or night sweats. Psych:  No mood changes, depression or anxiety. Pain:  No focal pain. Review of systems:  All other systems reviewed and found to be negative.   Physical Exam: Blood pressure 120/75, pulse 64, temperature 95.3 F (35.2 C), resp. rate 18, height 5' 8"  (1.727 m), weight 144 lb 8.2 oz (65.55 kg). GENERAL:   Well developed, well nourished, sitting comfortably in the exam room in no acute distress. MENTAL STATUS:  Alert and oriented to person, place and time. HEAD:  Alopecia.  Graying goatee.  Normocephalic, atraumatic, face symmetric, no Cushingoid features. EYES:  Brown eyes.  Pupils equal round and reactive to light and accomodation.  No conjunctivitis or scleral icterus. ENT:  Oropharynx clear without lesion.  Dentures.  Tongue normal. Mucous membranes moist.  RESPIRATORY:  Clear to auscultation without rales, wheezes or rhonchi. CARDIOVASCULAR:  Regular rate and rhythm without murmur, rub or gallop. ABDOMEN:  Soft, non-tender, with active bowel sounds, and no hepatosplenomegaly.  No masses. SKIN:  No rashes, ulcers or lesions. EXTREMITIES: No edema, no skin discoloration or tenderness.  No palpable cords. LYMPH NODES: 2 cm left axillary lymph node.  No palpable cervical, supraclavicular, or inguinal adenopathy  NEUROLOGICAL: Unremarkable. PSYCH:  Appropriate.  Appointment on 10/04/2015  Component Date Value Ref Range Status  . WBC 10/04/2015 22.7* 3.8 - 10.6 K/uL Final  . RBC 10/04/2015 5.13  4.40 - 5.90 MIL/uL Final  . Hemoglobin 10/04/2015 12.1* 13.0 - 18.0 g/dL Final  . HCT 10/04/2015 38.5* 40.0 - 52.0 % Final  . MCV 10/04/2015 75.0* 80.0 - 100.0 fL Final  . MCH 10/04/2015 23.7* 26.0 - 34.0 pg Final  . MCHC 10/04/2015 31.6* 32.0 - 36.0 g/dL Final  . RDW 10/04/2015 15.7* 11.5 - 14.5 % Final  . Platelets 10/04/2015 220  150 - 440 K/uL Final  . Neutrophils Relative % 10/04/2015 19   Final  . Neutro Abs 10/04/2015 4.2  1.4 - 6.5 K/uL Final  . Lymphocytes Relative 10/04/2015 76   Final  . Lymphs Abs 10/04/2015 17.2* 1.0 - 3.6 K/uL Final  . Monocytes Relative 10/04/2015 3   Final  . Monocytes Absolute 10/04/2015 0.7  0.2 - 1.0 K/uL Final  . Eosinophils Relative 10/04/2015 2   Final  . Eosinophils Absolute 10/04/2015 0.5  0 - 0.7 K/uL Final  . Basophils Relative 10/04/2015 0   Final  .  Basophils Absolute 10/04/2015 0.1  0 - 0.1 K/uL Final  . Sodium 10/04/2015 133* 135 - 145 mmol/L Final  . Potassium 10/04/2015 4.1  3.5 - 5.1 mmol/L Final  . Chloride 10/04/2015 102  101 - 111 mmol/L Final  . CO2 10/04/2015 27  22 - 32 mmol/L Final  . Glucose, Bld 10/04/2015 104* 65 - 99 mg/dL Final  . BUN 10/04/2015 14  6 - 20 mg/dL Final  . Creatinine, Ser 10/04/2015 1.12  0.61 - 1.24 mg/dL Final  . Calcium 10/04/2015 8.5* 8.9 - 10.3 mg/dL Final  . Total Protein 10/04/2015 8.0  6.5 - 8.1 g/dL Final  . Albumin 10/04/2015 4.2  3.5 - 5.0 g/dL Final  . AST 10/04/2015 25  15 - 41 U/L Final  . ALT 10/04/2015 16* 17 - 63 U/L Final  . Alkaline Phosphatase 10/04/2015 51  38 - 126 U/L Final  . Total Bilirubin 10/04/2015 1.0  0.3 - 1.2 mg/dL Final  . GFR calc non Af Amer 10/04/2015 >60  >60 mL/min Final  . GFR calc Af Amer 10/04/2015 >60  >60 mL/min Final   Comment: (NOTE) The eGFR has been calculated using the CKD EPI equation. This calculation has not been validated in all clinical situations. eGFR's persistently <60 mL/min signify possible Chronic Kidney Disease.   . Anion gap 10/04/2015 4* 5 - 15 Final  . LDH 10/04/2015 130  98 - 192 U/L Final  . Uric Acid, Serum 10/04/2015 5.4  4.4 - 7.6 mg/dL Final  . Retic Ct Pct 10/04/2015 1.2  0.4 - 3.1 % Final  . RBC. 10/04/2015 5.13  4.40 - 5.90 MIL/uL Final  . Retic Count, Manual 10/04/2015 61.6  19.0 - 183.0 K/uL Final    Assessment:  [name] is a 64 y.o. 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  He was treated with prednisone. With taper of prednisone, his hemolysis returned.  Chest and abdomen CT scan on 02/09/2014 revealed bulky bilateral axillary adenopathy and bulky intraperitoneal and retroperitoneal adenopathy. PET scan on 07/11/2014 revealed mildly hypermetabolic lymph nodes in the chest, abdomen, and pelvis and mildly hypermetabolic spleen.   Left axillary node  biopsy on 09/13/2014 confirmed B-cell small lymphocytic lymphoma (B-CLL/SLL).  He received 4 weekly cycles of Rituxan (10/02/2014 - 10/23/2014). His hematocrit normalized.  He has slowly progressive microcytic anemia over the past 3 months.  Colonoscopy on 07/23/2015 revealed diverticulosis in the sigmoid colon.  EGD on 08/20/2015 was normal.  Guaiac cards x 3 were negative in 06/2015.  His diet fluctuates with his appetite.  He denies any melana or hematochezia.  Symptomatically, he denies any fevers, sweats, or adenopathy. He is unable to gain weight.  Exam reveals a 2 cm left axillary lymph node.  Plan: 1. Labs today:  CBC with diff, CMP, LDH, uric acid, retic. 2. Discuss symptoms and new palpable adenopathy.  Discuss restaging scans for lymphoma.  Discuss stool studies. 3. Schedule PET scan-restaging lymphoma. 4. Stool studies. 5. Gastroenterology referral. 6. Re-request patient obtain primary care physician.  Decline Rx for Viagra. 7. RTC for MD assessment after PET scan.   Lequita Asal, MD  10/04/2015, 11:51 AM

## 2015-10-04 NOTE — Progress Notes (Signed)
Pt reports an increase in Diarre[place] to x3 movements a day.  His appetite fluctuates up and down some days good and some days bad.  Pt reports a pain in lower middle abdomina area that comes and migrates to the back.  Pt reports wanting to gain weight but has not has remained in same weight range since last visit.

## 2015-10-11 ENCOUNTER — Encounter: Payer: Self-pay | Admitting: Emergency Medicine

## 2015-10-11 ENCOUNTER — Emergency Department: Payer: BLUE CROSS/BLUE SHIELD

## 2015-10-11 ENCOUNTER — Emergency Department
Admission: EM | Admit: 2015-10-11 | Discharge: 2015-10-11 | Disposition: A | Discharge status: Home / Self Care | Payer: BLUE CROSS/BLUE SHIELD | Attending: Emergency Medicine | Admitting: Emergency Medicine

## 2015-10-11 ENCOUNTER — Encounter: Payer: Self-pay | Admitting: *Deleted

## 2015-10-11 DIAGNOSIS — R55 Syncope and collapse: Secondary | ICD-10-CM

## 2015-10-11 DIAGNOSIS — Z87891 Personal history of nicotine dependence: Secondary | ICD-10-CM | POA: Diagnosis not present

## 2015-10-11 DIAGNOSIS — Z91040 Latex allergy status: Secondary | ICD-10-CM | POA: Insufficient documentation

## 2015-10-11 DIAGNOSIS — Z79899 Other long term (current) drug therapy: Secondary | ICD-10-CM | POA: Diagnosis not present

## 2015-10-11 LAB — BASIC METABOLIC PANEL
Anion gap: 4 — ABNORMAL LOW (ref 5–15)
BUN: 14 mg/dL (ref 6–20)
CALCIUM: 9.1 mg/dL (ref 8.9–10.3)
CO2: 28 mmol/L (ref 22–32)
CREATININE: 1.24 mg/dL (ref 0.61–1.24)
Chloride: 102 mmol/L (ref 101–111)
Glucose, Bld: 91 mg/dL (ref 65–99)
Potassium: 3.9 mmol/L (ref 3.5–5.1)
SODIUM: 134 mmol/L — AB (ref 135–145)

## 2015-10-11 LAB — CBC WITH DIFFERENTIAL/PLATELET
Basophils Absolute: 0 10*3/uL (ref 0–0.1)
Basophils Relative: 0 %
EOS ABS: 0.2 10*3/uL (ref 0–0.7)
EOS PCT: 1 %
HEMATOCRIT: 40 % (ref 40.0–52.0)
Hemoglobin: 12.5 g/dL — ABNORMAL LOW (ref 13.0–18.0)
LYMPHS ABS: 14.2 10*3/uL — AB (ref 1.0–3.6)
Lymphocytes Relative: 79 %
MCH: 23.6 pg — ABNORMAL LOW (ref 26.0–34.0)
MCHC: 31.3 g/dL — AB (ref 32.0–36.0)
MCV: 75.6 fL — ABNORMAL LOW (ref 80.0–100.0)
MONO ABS: 0.5 10*3/uL (ref 0.2–1.0)
Monocytes Relative: 3 %
Neutro Abs: 3 10*3/uL (ref 1.4–6.5)
Neutrophils Relative %: 17 %
PLATELETS: 203 10*3/uL (ref 150–440)
RBC: 5.3 MIL/uL (ref 4.40–5.90)
RDW: 15.3 % — AB (ref 11.5–14.5)
Smear Review: ADEQUATE
WBC: 17.9 10*3/uL — AB (ref 3.8–10.6)

## 2015-10-11 LAB — URINALYSIS COMPLETE WITH MICROSCOPIC (ARMC ONLY)
BACTERIA UA: NONE SEEN
Bilirubin Urine: NEGATIVE
Glucose, UA: NEGATIVE mg/dL
Ketones, ur: NEGATIVE mg/dL
LEUKOCYTES UA: NEGATIVE
NITRITE: NEGATIVE
PH: 6 (ref 5.0–8.0)
PROTEIN: NEGATIVE mg/dL
SPECIFIC GRAVITY, URINE: 1.009 (ref 1.005–1.030)

## 2015-10-11 LAB — TROPONIN I: Troponin I: <0.03 ng/mL (ref <0.031)

## 2015-10-11 LAB — PATHOLOGIST SMEAR REVIEW

## 2015-10-11 NOTE — Progress Notes (Signed)
Patient presented to cancer center to schedule appt when became diaphoretic, lightheaded, dizzy, and felt like "passing out" after standing and walking up steps. Nurses were paged to assist patient and vitals were obtained. Spoke with on-call MD, Dr. Rogue Bussing, who advised to take patient to ED for further evaluation. Pt escorted to ED by this RN via wheelchair. Pt stated that lower back pain worsens while standing and then becomes lightheaded and dizzy. States that symptoms started this morning but low back pain has been ongoing for the past few weeks. Patient requests to change physicians from Dr. Mike Gip to Dr. Rogue Bussing. Dr. Rogue Bussing stated would be able to see patient at next appt.

## 2015-10-11 NOTE — ED Notes (Signed)
Pt from cancer center with near syncopal episode. Pt states he has hx of anemia.

## 2015-10-11 NOTE — Discharge Instructions (Signed)
You were evaluated for generalized weakness and nearly passing out, and her examiner evaluation are reassuring here in the emergency department today. Your blood cell count was somewhat elevated, however this is nonspecific. I suspected possible you may have a viral illness. Return to the emergency room for any new or worsening condition including any chest pain, trouble breathing, fever, abdominal pain, black or bloody stools, passing out, or any other symptoms concerning to you.   Near-Syncope Near-syncope (commonly known as near fainting) is sudden weakness, dizziness, or feeling like you might pass out. During an episode of near-syncope, you may also develop pale skin, have tunnel vision, or feel sick to your stomach (nauseous). Near-syncope may occur when getting up after sitting or while standing for a long time. It is caused by a sudden decrease in blood flow to the brain. This decrease can result from various causes or triggers, most of which are not serious. However, because near-syncope can sometimes be a sign of something serious, a medical evaluation is required. The specific cause is often not determined. HOME CARE INSTRUCTIONS  Monitor your condition for any changes. The following actions may help to alleviate any discomfort you are experiencing:  Have someone stay with you until you feel stable.  Lie down right away and prop your feet up if you start feeling like you might faint. Breathe deeply and steadily. Wait until all the symptoms have passed. Most of these episodes last only a few minutes. You may feel tired for several hours.   Drink enough fluids to keep your urine clear or pale yellow.   If you are taking blood pressure or heart medicine, get up slowly when seated or lying down. Take several minutes to sit and then stand. This can reduce dizziness.  Follow up with your health care provider as directed. SEEK IMMEDIATE MEDICAL CARE IF:   You have a severe headache.    You have unusual pain in the chest, abdomen, or back.   You are bleeding from the mouth or rectum, or you have black or tarry stool.   You have an irregular or very fast heartbeat.   You have repeated fainting or have seizure-like jerking during an episode.   You faint when sitting or lying down.   You have confusion.   You have difficulty walking.   You have severe weakness.   You have vision problems.  MAKE SURE YOU:   Understand these instructions.  Will watch your condition.  Will get help right away if you are not doing well or get worse.   This information is not intended to replace advice given to you by your health care provider. Make sure you discuss any questions you have with your health care provider.   Document Released: 12/07/2005 Document Revised: 12/12/2013 Document Reviewed: 05/12/2013 Elsevier Interactive Patient Education [employer].

## 2015-10-11 NOTE — ED Provider Notes (Signed)
[facility] Emergency Department Provider Note   ____________________________________________  Time seen: On arrival to ED room I have reviewed the triage vital signs and the triage nursing note.  HISTORY  Chief Complaint Near Syncope   Historian Patient  HPI [name] is a 64 y.o. male who is here for evaluation of feeling overall generalized weakness as well as an episode of nearly passing out today. Patient states he's not felt real well for about 2 days, and today he went to her clinic walk-in and as he was waiting in line he started to feel very lightheaded like he might pass out and so he went over to the [facility] where he has been seen previously for anemia with iron infusion, with no history of cancer, and he was sent directly to the emergency department for evaluation.  No chest pain. He has had a mild cough which is nonproductive. There is been no fever. States he's had increased frequency of urination but without dysuria. No laceration or diarrhea.    Past Medical History  Diagnosis Date  . Hyperlipidemia   . Anemia 2008  . Arthritis     Patient Active Problem List   Diagnosis Date Noted  . Absolute anemia 08/07/2015  . Encounter for screening colonoscopy 07/03/2015  . Hemolytic anemia ([place]) 06/15/2015  . Microcytic anemia 06/15/2015  . Chronic lymphocytic leukemia (CLL), B-cell ([place]) 06/15/2015  . 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 Seroma complicating a procedure 09/18/2014  . Lymphadenopathy 09/05/2014    Past Surgical History  Procedure Laterality Date  . Lymp node removal Right 2011    arm  . Colonoscopy    . Lymph node biopsy  08/2014  . Colonoscopy with propofol N/A 07/23/2015    Procedure: COLONOSCOPY WITH PROPOFOL;  Surgeon: [name], MD;  Location: [facility] ENDOSCOPY;  Service: Endoscopy;  Laterality: N/A;  . Esophagogastroduodenoscopy (egd) with propofol N/A  08/20/2015    Procedure: ESOPHAGOGASTRODUODENOSCOPY (EGD) WITH PROPOFOL;  Surgeon: [name], MD;  Location: [facility] ENDOSCOPY;  Service: Endoscopy;  Laterality: N/A;    Current Outpatient Rx  Name  Route  Sig  Dispense  Refill  . iron polysaccharides (NIFEREX) 150 MG capsule   Oral   Take 150 mg by mouth daily.         . naproxen sodium (ANAPROX) 220 MG tablet   Oral   Take 220 mg by mouth as needed.         . pantoprazole (PROTONIX) 40 MG tablet   Oral   Take 40 mg by mouth daily.         [name] VIAGRA 50 MG tablet      as needed.       0     Allergies Latex  No family history on file.  Social History Social History  Substance Use Topics  . Smoking status: Former Smoker    Types: Cigars  . Smokeless tobacco: Never Used  . Alcohol Use: No    Review of Systems  Constitutional: Negative for fever. Eyes: Negative for visual changes. ENT: Negative for sore throat. Cardiovascular: Negative for chest pain. Respiratory: Negative for shortness of breath. Gastrointestinal: Negative for abdominal pain, vomiting and diarrhea. Genitourinary: Negative for dysuria. Musculoskeletal: Negative for back pain. Skin: Negative for rash. Neurological: Negative for headache. 10 point Review of Systems otherwise negative ____________________________________________   PHYSICAL EXAM:  VITAL SIGNS: ED Triage Vitals  Enc Vitals Group  BP 10/11/15 0946 123/76 mmHg     Pulse Rate 10/11/15 0946 49     Resp 10/11/15 0946 16     Temp --      Temp src --      SpO2 10/11/15 0946 98 %     Weight 10/11/15 0946 143 lb (64.864 kg)     Height 10/11/15 0946 5\' 8"  (1.727 m)     Head Cir --      Peak Flow --      Pain Score 10/11/15 0947 0     Pain Loc --      Pain Edu? --      Excl. in [place]? --      Constitutional: Alert and oriented. Well appearing and in no distress. Eyes: Conjunctivae are normal. PERRL. Normal extraocular movements. ENT   Head: Normocephalic and  atraumatic.   Nose: No congestion/rhinnorhea.   Mouth/Throat: Mucous membranes are moist.   Neck: No stridor. Cardiovascular/Chest: Normal rate, regular rhythm.  No murmurs, rubs, or gallops. Respiratory: Normal respiratory effort without tachypnea nor retractions. Breath sounds are clear and equal bilaterally. No wheezes/rales/rhonchi. Gastrointestinal: Soft. No distention, no guarding, no rebound. Nontender   Genitourinary/rectal:Deferred Musculoskeletal: Nontender with normal range of motion in all extremities. No joint effusions.  No lower extremity tenderness.  No edema. Neurologic:  Normal speech and language. No gross or focal neurologic deficits are appreciated. Skin:  Skin is warm, dry and intact. No rash noted. Psychiatric: Mood and affect are normal. Speech and behavior are normal. Patient exhibits appropriate insight and judgment.  ____________________________________________   EKG I, Lisa Roca, MD, the attending physician have personally viewed and interpreted all ECGs.  53 bpm. Sinus bradycardia. Narrow QRS. Left axis deviation. T-wave inversions inferiorly and laterally. These are new from previous. ____________________________________________  LABS (pertinent positives/negatives)  Urinalysis negative for ketones, nitrites, leukocytes, red blood cells or white blood cells or bacteria.  Basic metabolic panel sodium your sodium 134 and otherwise without significant abnormality White blood count 17.9, hemoglobin is 12.5 and platelet count was 3 Troponin less than 0.03  ____________________________________________  RADIOLOGY All Xrays were viewed by me. Imaging interpreted by Radiologist.  Chest x-ray two-view:  No active cardiopulmonary disease. __________________________________________  PROCEDURES  Procedure(s) performed: None  Critical Care performed: None  ____________________________________________   ED COURSE / ASSESSMENT AND  PLAN  CONSULTATIONS: None   Pertinent labs & imaging results that were available during my care of the patient were reviewed by me and considered in my medical decision making (see chart for details).  It sounds like this patient may be in the early symptoms of a upper respiratory infection, and is overall well-appearing here in the emergency department.  He is bradycardic, however reports a history that he always has a low heart rate. His EKG does show some T-wave inversions inferiorly and laterally which were not on prior EKG. I will check laboratory studies including troponin. He's not had any chest pain or palpitations.  Physical exam and laboratory evaluation are reassuring.  He does have an elevated white blood cell count, however this is nonspecific.  Given he has felt some fatigue, I suspect he may be having a viral infection. He is safe/stable to discharge at this point.  We discussed return precautions.  Patient / Family / Caregiver informed of clinical course, medical decision-making process, and agree with plan.   I discussed return precautions, follow-up instructions, and discharged instructions with patient and/or family.  ___________________________________________   FINAL CLINICAL IMPRESSION(S) /  ED DIAGNOSES   Final diagnoses:  Near syncope       Lisa Roca, MD 10/11/15 1320

## 2016-01-31 ENCOUNTER — Encounter: Payer: Self-pay | Admitting: Hematology and Oncology

## 2016-03-27 DIAGNOSIS — J209 Acute bronchitis, unspecified: Secondary | ICD-10-CM | POA: Diagnosis not present

## 2016-06-17 ENCOUNTER — Inpatient Hospital Stay: Payer: BLUE CROSS/BLUE SHIELD | Attending: Hematology and Oncology | Admitting: Hematology and Oncology

## 2016-06-17 ENCOUNTER — Encounter: Payer: Self-pay | Admitting: Hematology and Oncology

## 2016-06-17 ENCOUNTER — Inpatient Hospital Stay: Payer: BLUE CROSS/BLUE SHIELD

## 2016-06-17 VITALS — BP 115/82 | HR 58 | Temp 94.9°F | Resp 18 | Ht 68.0 in | Wt 140.1 lb

## 2016-06-17 DIAGNOSIS — D509 Iron deficiency anemia, unspecified: Secondary | ICD-10-CM

## 2016-06-17 DIAGNOSIS — Z79899 Other long term (current) drug therapy: Secondary | ICD-10-CM | POA: Diagnosis not present

## 2016-06-17 DIAGNOSIS — E785 Hyperlipidemia, unspecified: Secondary | ICD-10-CM | POA: Diagnosis not present

## 2016-06-17 DIAGNOSIS — R634 Abnormal weight loss: Secondary | ICD-10-CM

## 2016-06-17 DIAGNOSIS — F17210 Nicotine dependence, cigarettes, uncomplicated: Secondary | ICD-10-CM | POA: Insufficient documentation

## 2016-06-17 DIAGNOSIS — R109 Unspecified abdominal pain: Secondary | ICD-10-CM | POA: Diagnosis not present

## 2016-06-17 DIAGNOSIS — K5730 Diverticulosis of large intestine without perforation or abscess without bleeding: Secondary | ICD-10-CM | POA: Diagnosis not present

## 2016-06-17 DIAGNOSIS — R197 Diarrhea, unspecified: Secondary | ICD-10-CM | POA: Diagnosis not present

## 2016-06-17 DIAGNOSIS — D589 Hereditary hemolytic anemia, unspecified: Secondary | ICD-10-CM | POA: Diagnosis not present

## 2016-06-17 DIAGNOSIS — R590 Localized enlarged lymph nodes: Secondary | ICD-10-CM | POA: Insufficient documentation

## 2016-06-17 DIAGNOSIS — C9110 Chronic lymphocytic leukemia of B-cell type not having achieved remission: Secondary | ICD-10-CM | POA: Insufficient documentation

## 2016-06-17 DIAGNOSIS — M129 Arthropathy, unspecified: Secondary | ICD-10-CM | POA: Insufficient documentation

## 2016-06-17 LAB — CBC WITH DIFFERENTIAL/PLATELET
Basophils Absolute: 0.2 10*3/uL — ABNORMAL HIGH (ref 0–0.1)
Basophils Relative: 1 %
Eosinophils Absolute: 0.5 10*3/uL (ref 0–0.7)
Eosinophils Relative: 2 %
HCT: 38.5 % — ABNORMAL LOW (ref 40.0–52.0)
Hemoglobin: 12.1 g/dL — ABNORMAL LOW (ref 13.0–18.0)
Lymphocytes Relative: 78 %
Lymphs Abs: 21.1 10*3/uL — ABNORMAL HIGH (ref 1.0–3.6)
MCH: 24 pg — ABNORMAL LOW (ref 26.0–34.0)
MCHC: 31.4 g/dL — ABNORMAL LOW (ref 32.0–36.0)
MCV: 76.3 fL — ABNORMAL LOW (ref 80.0–100.0)
Monocytes Absolute: 0.7 10*3/uL (ref 0.2–1.0)
Monocytes Relative: 3 %
Neutro Abs: 4.3 10*3/uL (ref 1.4–6.5)
Neutrophils Relative %: 16 %
Platelets: 227 10*3/uL (ref 150–440)
RBC: 5.04 MIL/uL (ref 4.40–5.90)
RDW: 16 % — ABNORMAL HIGH (ref 11.5–14.5)
WBC: 26.8 10*3/uL — ABNORMAL HIGH (ref 3.8–10.6)

## 2016-06-17 LAB — COMPREHENSIVE METABOLIC PANEL
ALT: 14 U/L — ABNORMAL LOW (ref 17–63)
AST: 20 U/L (ref 15–41)
Albumin: 4.2 g/dL (ref 3.5–5.0)
Alkaline Phosphatase: 48 U/L (ref 38–126)
Anion gap: 6 (ref 5–15)
BUN: 13 mg/dL (ref 6–20)
CO2: 24 mmol/L (ref 22–32)
Calcium: 9.1 mg/dL (ref 8.9–10.3)
Chloride: 105 mmol/L (ref 101–111)
Creatinine, Ser: 1.03 mg/dL (ref 0.61–1.24)
GFR calc Af Amer: >60 mL/min (ref >60)
GFR calc non Af Amer: >60 mL/min (ref >60)
Glucose, Bld: 88 mg/dL (ref 65–99)
Potassium: 4.3 mmol/L (ref 3.5–5.1)
Sodium: 135 mmol/L (ref 135–145)
Total Bilirubin: 0.9 mg/dL (ref 0.3–1.2)
Total Protein: 7.8 g/dL (ref 6.5–8.1)

## 2016-06-17 LAB — RETICULOCYTES
RBC.: 4.86 MIL/uL (ref 4.40–5.90)
Retic Count, Absolute: 72.9 10*3/uL (ref 19.0–183.0)
Retic Ct Pct: 1.5 % (ref 0.4–3.1)

## 2016-06-17 LAB — URIC ACID: Uric Acid, Serum: 5.9 mg/dL (ref 4.4–7.6)

## 2016-06-17 LAB — LACTATE DEHYDROGENASE: LDH: 136 U/L (ref 98–192)

## 2016-06-17 NOTE — Progress Notes (Signed)
[facility] day:  06/17/2016    Chief Complaint: [name] is a 65 y.o. male with lymphocytic lymphoma with associated hemolytic anemia seen for 3 month assessment.  HPI:  The patient was last seen in the medical onocology clinic on 10/04/2015.  At that time, he denied any fevers, sweats, or adenopathy. He was unable to gain weight.  Exam revealed a 2 cm left axillary lymph node.  He notes that his diarrhea has come back. He describes it as "now and then". It could be due to his diet (diary).  He states that sometimes he can't eat.  At times, his stomach pain wakes him up.  He gets good relief of his pain after a bowel movement. He denies any B symptoms. He denies any adenopathy, bruising or bleeding.  He states that he can not gain weight.    Past Medical History  Diagnosis Date  . Hyperlipidemia   . Anemia 2008  . Arthritis     Past Surgical History  Procedure Laterality Date  . Lymp node removal Right 2011    arm  . Colonoscopy    . Lymph node biopsy  08/2014  . Colonoscopy with propofol N/A 07/23/2015    Procedure: COLONOSCOPY WITH PROPOFOL;  Surgeon: [name], MD;  Location: [facility] ENDOSCOPY;  Service: Endoscopy;  Laterality: N/A;  . Esophagogastroduodenoscopy (egd) with propofol N/A 08/20/2015    Procedure: ESOPHAGOGASTRODUODENOSCOPY (EGD) WITH PROPOFOL;  Surgeon: [name], MD;  Location: [facility] ENDOSCOPY;  Service: Endoscopy;  Laterality: N/A;    History reviewed. No pertinent family history.  Social History:  reports that he has been smoking Cigars.  He has never used smokeless tobacco. He reports that he does not drink alcohol or use illicit drugs.  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  He works 14 hours a day. The patient is alone today.  Allergies:  Allergies  Allergen Reactions  . Latex Rash    Current Medications: Current Outpatient Prescriptions   Medication Sig Dispense Refill  . iron polysaccharides (NIFEREX) 150 MG capsule Take 150 mg by mouth daily. Reported on 06/17/2016    . naproxen sodium (ANAPROX) 220 MG tablet Take 220 mg by mouth as needed. Reported on 06/17/2016     No current facility-administered medications for this visit.    Review of Systems:  GENERAL:  Feels "ok".  No fevers or sweats.  Weight loss of 3 pounds. PERFORMANCE STATUS (ECOG):  0 HEENT:  No visual changes, runny nose, sore throat, mouth sores or tenderness. Lungs: No shortness of breath or cough.  No hemoptysis. Cardiac:  No chest pain, palpitations, orthopnea, or PND. GI:  Intermittent diarrhea and abdominal pain (see HPI).  No nausea, vomiting, constipation, melena or hematochezia. GU:  Urgency.  No frequency, dysuria, or hematuria.  Needs PSA checked. Musculoskeletal:  Back pain.  Arthritis.  No muscle tenderness. Extremities:  No pain or swelling. Skin:  No rashes or skin changes. Neuro:  No headache, numbness or weakness, balance or coordination issues. Endocrine:  No diabetes, thyroid issues, hot flashes or night sweats. Psych:  No mood changes, depression or anxiety. Pain:  No focal pain. Review of systems:  All other systems reviewed and found to be negative.   Physical Exam: Blood pressure 115/82, pulse 58, temperature 94.9 F (34.9 C), temperature source Tympanic, resp. rate 18, height 5' 8" (1.727 m), weight 140 lb 1.6 oz (  63.55 kg). GENERAL:  Well developed, well nourished, gentleman sitting comfortably in the exam room in no acute distress. MENTAL STATUS:  Alert and oriented to person, place and time. HEAD:  Wearing a black cap.  Alopecia.  Graying goatee.  Normocephalic, atraumatic, face symmetric, no Cushingoid features. EYES:  Brown eyes.  Pupils equal round and reactive to light and accomodation.  No conjunctivitis or scleral icterus. ENT:  Oropharynx clear without lesion.  Dentures.  Tongue normal. Mucous membranes moist.   RESPIRATORY:  Clear to auscultation without rales, wheezes or rhonchi. CARDIOVASCULAR:  Regular rate and rhythm without murmur, rub or gallop. ABDOMEN:  Soft, non-tender, with active bowel sounds, and no hepatosplenomegaly.  No masses. SKIN:  No rashes, ulcers or lesions. EXTREMITIES: No edema, no skin discoloration or tenderness.  No palpable cords. LYMPH NODES:  No palpable cervical, supraclavicular, axillary or inguinal adenopathy  NEUROLOGICAL: Unremarkable. PSYCH:  Appropriate.  No visits with results within 3 Day(s) from this visit. Latest known visit with results is:  Admission on 10/11/2015, Discharged on 10/11/2015  Component Date Value Ref Range Status  . Color, Urine 10/11/2015 YELLOW* YELLOW Final  . APPearance 10/11/2015 CLEAR* CLEAR Final  . Glucose, UA 10/11/2015 NEGATIVE  NEGATIVE mg/dL Final  . Bilirubin Urine 10/11/2015 NEGATIVE  NEGATIVE Final  . Ketones, ur 10/11/2015 NEGATIVE  NEGATIVE mg/dL Final  . Specific Gravity, Urine 10/11/2015 1.009  1.005 - 1.030 Final  . Hgb urine dipstick 10/11/2015 2+* NEGATIVE Final  . pH 10/11/2015 6.0  5.0 - 8.0 Final  . Protein, ur 10/11/2015 NEGATIVE  NEGATIVE mg/dL Final  . Nitrite 10/11/2015 NEGATIVE  NEGATIVE Final  . Leukocytes, UA 10/11/2015 NEGATIVE  NEGATIVE Final  . RBC / HPF 10/11/2015 0-5  0 - 5 RBC/hpf Final  . WBC, UA 10/11/2015 0-5  0 - 5 WBC/hpf Final  . Bacteria, UA 10/11/2015 NONE SEEN  NONE SEEN Final  . Squamous Epithelial / LPF 10/11/2015 0-5* NONE SEEN Final  . Mucous 10/11/2015 PRESENT   Final  . Sodium 10/11/2015 134* 135 - 145 mmol/L Final  . Potassium 10/11/2015 3.9  3.5 - 5.1 mmol/L Final  . Chloride 10/11/2015 102  101 - 111 mmol/L Final  . CO2 10/11/2015 28  22 - 32 mmol/L Final  . Glucose, Bld 10/11/2015 91  65 - 99 mg/dL Final  . BUN 10/11/2015 14  6 - 20 mg/dL Final  . Creatinine, Ser 10/11/2015 1.24  0.61 - 1.24 mg/dL Final  . Calcium 10/11/2015 9.1  8.9 - 10.3 mg/dL Final  . GFR calc non Af  Amer 10/11/2015 >60  >60 mL/min Final  . GFR calc Af Amer 10/11/2015 >60  >60 mL/min Final   Comment: (NOTE) The eGFR has been calculated using the CKD EPI equation. This calculation has not been validated in all clinical situations. eGFR's persistently <60 mL/min signify possible Chronic Kidney Disease.   . Anion gap 10/11/2015 4* 5 - 15 Final  . WBC 10/11/2015 17.9* 3.8 - 10.6 K/uL Final  . RBC 10/11/2015 5.30  4.40 - 5.90 MIL/uL Final  . Hemoglobin 10/11/2015 12.5* 13.0 - 18.0 g/dL Final  . HCT 10/11/2015 40.0  40.0 - 52.0 % Final  . MCV 10/11/2015 75.6* 80.0 - 100.0 fL Final  . MCH 10/11/2015 23.6* 26.0 - 34.0 pg Final  . MCHC 10/11/2015 31.3* 32.0 - 36.0 g/dL Final  . RDW 10/11/2015 15.3* 11.5 - 14.5 % Final  . Platelets 10/11/2015 203  150 - 440 K/uL Final  . Neutrophils Relative %  10/11/2015 17   Final  . Lymphocytes Relative 10/11/2015 79   Final  . Monocytes Relative 10/11/2015 3   Final  . Eosinophils Relative 10/11/2015 1   Final  . Basophils Relative 10/11/2015 0   Final  . Neutro Abs 10/11/2015 3.0  1.4 - 6.5 K/uL Final  . Lymphs Abs 10/11/2015 14.2* 1.0 - 3.6 K/uL Final  . Monocytes Absolute 10/11/2015 0.5  0.2 - 1.0 K/uL Final  . Eosinophils Absolute 10/11/2015 0.2  0 - 0.7 K/uL Final  . Basophils Absolute 10/11/2015 0.0  0 - 0.1 K/uL Final  . WBC Morphology 10/11/2015 ATYPICAL LYMPHOCYTES   Final   SMUDGE CELLS  . Smear Review 10/11/2015 PLATELETS APPEAR ADEQUATE   Final   PATH REVIEW HAS BEEN ORDERED ON THIS ACCESSION NUMBER  . Troponin I 10/11/2015 <0.03  <0.031 ng/mL Final   Comment:        NO INDICATION OF MYOCARDIAL INJURY.   . Path Review 10/11/2015 Blood smear reviewed. Platelets are adequate. There is microcytosis and mild anemia. Lymphocytosis is present, with small lymphocytes morphologically consistent with chronic lymphocytic leukemia. The history of CLL/SLL is noted.   Final   Reviewed by Lemmie Evens. Dicie Beam, MD.    Assessment:  [name] is a 65  y.o. 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  He was treated with prednisone. With taper of prednisone, his hemolysis returned.  Chest and abdomen CT scan on 02/09/2014 revealed bulky bilateral axillary adenopathy and bulky intraperitoneal and retroperitoneal adenopathy. PET scan on 07/11/2014 revealed mildly hypermetabolic lymph nodes in the chest, abdomen, and pelvis and mildly hypermetabolic spleen.   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  He received 4 weekly cycles of Rituxan (10/02/2014 - 10/23/2014). His hematocrit normalized.  He has slowly progressive microcytic anemia.  Colonoscopy on 07/23/2015 revealed diverticulosis in the sigmoid colon.  EGD on 08/20/2015 was normal.  Guaiac cards x 3 were negative in 06/2015.  His diet fluctuates with his appetite.  He denies any melana or hematochezia.  Symptomatically, he denies any B symptoms.  He has intermittent diarrhea. He has lost 3 pounds.  Exam reveals no adenopathy.  Plan: 1.  Labs today:  CBC with diff, CMP, LDH, uric acid, retic. 2.  Discuss symptoms and microcytic anemia.  Re-discuss restaging scans for lymphoma.  3.  Re-schedule PET scan-restaging lymphoma. 4.  RTC for MD assessment after PET scan.   Lequita Asal, MD  06/17/2016, 11:50 AM

## 2016-06-17 NOTE — Progress Notes (Signed)
Pt reports same back pain in the lower back.  Pt reports that sometimes he has such an urge to urinate that he cannot make it.  Blurry vision that comes can goes.  An appetite that varies and weight loss that varies also diarhea at least once a week.  Reports left arm numbness at times since the lymph node removal.

## 2016-06-21 DIAGNOSIS — R634 Abnormal weight loss: Secondary | ICD-10-CM | POA: Insufficient documentation

## 2016-06-24 ENCOUNTER — Ambulatory Visit
Admission: RE | Admit: 2016-06-24 | Discharge: 2016-06-24 | Disposition: A | Discharge status: Home / Self Care | Payer: BLUE CROSS/BLUE SHIELD | Source: Ambulatory Visit | Attending: Hematology and Oncology | Admitting: Hematology and Oncology

## 2016-06-24 DIAGNOSIS — R590 Localized enlarged lymph nodes: Secondary | ICD-10-CM | POA: Insufficient documentation

## 2016-06-24 DIAGNOSIS — K5730 Diverticulosis of large intestine without perforation or abscess without bleeding: Secondary | ICD-10-CM | POA: Insufficient documentation

## 2016-06-24 DIAGNOSIS — C9110 Chronic lymphocytic leukemia of B-cell type not having achieved remission: Secondary | ICD-10-CM | POA: Diagnosis not present

## 2016-06-24 DIAGNOSIS — J439 Emphysema, unspecified: Secondary | ICD-10-CM | POA: Diagnosis not present

## 2016-06-24 DIAGNOSIS — I2510 Atherosclerotic heart disease of native coronary artery without angina pectoris: Secondary | ICD-10-CM | POA: Diagnosis not present

## 2016-06-24 DIAGNOSIS — N400 Enlarged prostate without lower urinary tract symptoms: Secondary | ICD-10-CM | POA: Diagnosis not present

## 2016-06-24 LAB — GLUCOSE, CAPILLARY: Glucose-Capillary: 92 mg/dL (ref 65–99)

## 2016-06-24 MED ORDER — FLUDEOXYGLUCOSE F - 18 (FDG) INJECTION
12.0000 | Freq: Once | INTRAVENOUS | Status: AC | PRN
  Administered 2016-06-24: 12.8 via INTRAVENOUS

## 2016-07-01 ENCOUNTER — Inpatient Hospital Stay: Payer: BLUE CROSS/BLUE SHIELD

## 2016-07-01 ENCOUNTER — Encounter: Payer: Self-pay | Admitting: Hematology and Oncology

## 2016-07-01 ENCOUNTER — Inpatient Hospital Stay: Payer: BLUE CROSS/BLUE SHIELD | Attending: Hematology and Oncology | Admitting: Hematology and Oncology

## 2016-07-01 VITALS — BP 114/69 | HR 51 | Temp 97.5°F | Resp 18 | Wt 139.0 lb

## 2016-07-01 DIAGNOSIS — R197 Diarrhea, unspecified: Secondary | ICD-10-CM | POA: Diagnosis not present

## 2016-07-01 DIAGNOSIS — R634 Abnormal weight loss: Secondary | ICD-10-CM

## 2016-07-01 DIAGNOSIS — E785 Hyperlipidemia, unspecified: Secondary | ICD-10-CM

## 2016-07-01 DIAGNOSIS — F17200 Nicotine dependence, unspecified, uncomplicated: Secondary | ICD-10-CM | POA: Diagnosis not present

## 2016-07-01 DIAGNOSIS — D649 Anemia, unspecified: Secondary | ICD-10-CM

## 2016-07-01 DIAGNOSIS — K5730 Diverticulosis of large intestine without perforation or abscess without bleeding: Secondary | ICD-10-CM

## 2016-07-01 DIAGNOSIS — M129 Arthropathy, unspecified: Secondary | ICD-10-CM | POA: Diagnosis not present

## 2016-07-01 DIAGNOSIS — C9110 Chronic lymphocytic leukemia of B-cell type not having achieved remission: Secondary | ICD-10-CM

## 2016-07-01 DIAGNOSIS — D589 Hereditary hemolytic anemia, unspecified: Secondary | ICD-10-CM

## 2016-07-01 LAB — IRON AND TIBC
Iron: 61 ug/dL (ref 45–182)
Saturation Ratios: 25 % (ref 17.9–39.5)
TIBC: 246 ug/dL — ABNORMAL LOW (ref 250–450)
UIBC: 185 ug/dL

## 2016-07-01 LAB — FERRITIN: Ferritin: 345 ng/mL — ABNORMAL HIGH (ref 24–336)

## 2016-07-01 LAB — T4, FREE: Free T4: 0.94 ng/dL (ref 0.61–1.12)

## 2016-07-01 LAB — TSH: TSH: 2.463 u[IU]/mL (ref 0.350–4.500)

## 2016-07-01 LAB — SEDIMENTATION RATE: Sed Rate: 17 mm/hr (ref 0–20)

## 2016-07-01 MED ORDER — POLYSACCHARIDE IRON COMPLEX 150 MG PO CAPS: 150.0000 mg | ORAL_CAPSULE | Freq: Every day | ORAL | Status: DC

## 2016-07-01 NOTE — Progress Notes (Signed)
[facility] day:  07/01/2016    Chief Complaint: [name] is a 65 y.o. male with lymphocytic lymphoma with associated hemolytic anemia seen for review of interval PET scan.  HPI:  The patient was last seen in the medical onocology clinic on 06/17/2016.  At that time, he denied any B symptoms.  He had intermittent diarrhea. He had lost 3 pounds.  Exam revealed no adenopathy.  We discussed restaging scans for his lymphoma.  PET scan on 06/24/2016 revealed bilateral moderate hypermetabolic axillary lymphadenopathy increased in size (1.6 cm compared to 1.3 cm) and metabolism (SUV 3.9 compared to 3.6).  There were bilateral moderate hypermetabolic external iliac lymphadenopathy stable to decreased in metabolism.  There was mesenteric and retroperitoneal lymphadenopathy  stable in size and non hypermetabolic.  There were no new sites of hypermetabolic lymphoma.  There were subcentimeter pulmonary nodules stable and below PET resolution.  Symptomatically, he is fatigued.  He works 11-12 hour days (8AM to 8PM or 3 AM to 3 PM).  He does not have much of a chance to eat during work.  He describes his appetite as up and down.  He has had some cold chills.  He has back issues (none in 3 days).  He denies any diarrhea.    Past Medical History  Diagnosis Date  . Hyperlipidemia   . Anemia 2008  . Arthritis     Past Surgical History  Procedure Laterality Date  . Lymp node removal Right 2011    arm  . Colonoscopy    . Lymph node biopsy  08/2014  . Colonoscopy with propofol N/A 07/23/2015    Procedure: COLONOSCOPY WITH PROPOFOL;  Surgeon: [name], MD;  Location: [facility] ENDOSCOPY;  Service: Endoscopy;  Laterality: N/A;  . Esophagogastroduodenoscopy (egd) with propofol N/A 08/20/2015    Procedure: ESOPHAGOGASTRODUODENOSCOPY (EGD) WITH PROPOFOL;  Surgeon: [name], MD;  Location: [facility] ENDOSCOPY;  Service: Endoscopy;  Laterality: N/A;     History reviewed. No pertinent family history.  Social History:  reports that he has been smoking Cigars.  He has never used smokeless tobacco. He reports that he does not drink alcohol or use illicit drugs.  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  He works 11-12 hours a day. The patient is accompanied by his wife, Estill Bamberg, today.  Allergies:  Allergies  Allergen Reactions  . Latex Rash    Current Medications: Current Outpatient Prescriptions  Medication Sig Dispense Refill  . iron polysaccharides (NIFEREX) 150 MG capsule Take 150 mg by mouth daily. Reported on 06/17/2016    . naproxen sodium (ANAPROX) 220 MG tablet Take 220 mg by mouth as needed. Reported on 06/17/2016     No current facility-administered medications for this visit.    Review of Systems:  GENERAL:  Feels "ok".  No fevers or sweats.  Some cold chills.  Weight loss of 1 pound. PERFORMANCE STATUS (ECOG):  1 HEENT:  No visual changes, runny nose, sore throat, mouth sores or tenderness. Lungs: No shortness of breath or cough.  No hemoptysis. Cardiac:  No chest pain, palpitations, orthopnea, or PND. GI: Appetite is up and down.  No nausea, vomiting, diarrhea, constipation, melena or hematochezia. GU:  No urgency, frequency, dysuria, or hematuria.  Needs PSA checked. Musculoskeletal:  Back pain.  Arthritis.  No muscle tenderness. Extremities:  No pain or swelling. Skin:  No rashes or skin changes. Neuro:  No headache, numbness or weakness, balance or coordination issues. Endocrine:  No diabetes, thyroid issues, hot flashes or night sweats. Psych:  No mood changes, depression or anxiety. Pain:  No focal pain. Review of systems:  All other systems reviewed and found to be negative.   Physical Exam: Blood pressure 114/69, pulse 51, temperature 97.5 F (36.4 C), temperature source Tympanic, resp. rate 18, weight 139 lb (63.05 kg). GENERAL:  Well developed, well  nourished, gentleman sitting comfortably in the exam room in no acute distress. MENTAL STATUS:  Alert and oriented to person, place and time. HEAD:  Alopecia.  Graying goatee.  Normocephalic, atraumatic, face symmetric, no Cushingoid features. EYES:  Brown eyes.  No conjunctivitis or scleral icterus. NEUROLOGICAL: Unremarkable. PSYCH:  Appropriate.  No visits with results within 3 Day(s) from this visit. Latest known visit with results is:  Hospital Outpatient Visit on 06/24/2016  Component Date Value Ref Range Status  . Glucose-Capillary 06/24/2016 92  65 - 99 mg/dL Final    Assessment:  [name] is a 65 y.o. 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  He was treated with prednisone. With taper of prednisone, his hemolysis returned.  Chest and abdomen CT scan on 02/09/2014 revealed bulky bilateral axillary adenopathy and bulky intraperitoneal and retroperitoneal adenopathy. PET scan on 07/11/2014 revealed mildly hypermetabolic lymph nodes in the chest, abdomen, and pelvis and mildly hypermetabolic spleen.   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  He received 4 weekly cycles of Rituxan (10/02/2014 - 10/23/2014). His hematocrit normalized.  He has slowly progressive microcytic anemia.  Colonoscopy on 07/23/2015 revealed diverticulosis in the sigmoid colon.  EGD on 08/20/2015 was normal.  Guaiac cards x 3 were negative in 06/2015.  His diet fluctuates with his appetite.  He denies any melana or hematochezia.  PET scan on 06/24/2016 revealed bilateral moderate hypermetabolic axillary lymphadenopathy increased in size (1.6 cm compared to 1.3 cm) and metabolism (SUV 3.9 compared to 3.6).  There were bilateral moderate hypermetabolic external iliac lymphadenopathy stable to decreased in metabolism.  There was mesenteric and retroperitoneal lymphadenopathy stable in size and non  hypermetabolic.  There were no new sites of hypermetabolic lymphoma.  There were subcentimeter pulmonary nodules stable and below PET resolution.  Symptomatically, he denies any B symptoms.  He is working long hours with little opportunity to eat.  Weight is down 10 pounds in the past year.  Exam reveals no adenopathy.  Plan: 1.  Review PET scan.  Discuss relatively stable disease without clear indication for treatment. 2.  Labs today:  TSH, free T4, ferritin, iron studies, ESR. 3.  Discuss importance of caloric intake. 4.  RTC in 1 month for MD assessment and labs (CBC with diff, BMP, LDH, uric acid, ferritin).   Lequita Asal, MD  07/01/2016, 11:09 AM

## 2016-07-28 NOTE — Progress Notes (Deleted)
[facility] day:  07/28/16    Chief Complaint: [name] is a 65 y.o. male with lymphocytic lymphoma with associated hemolytic anemia seen for 1 month assessment.  HPI:  The patient was last seen in the medical onocology clinic on 07/01/2016.  At that time, interval PET scan revealed predominantly stable disease with some smaller lymph nodes, some stable, and axillary adenopathy slightly larger (by 3 mm) and more metabolic (3.9 versus 3.6).  He felt fatigued working long hours.  He did not have much of a chance to eat during work. Appetite was up and down.  Weight was down 10 pounds in the past year.  Labs at last visit included the following normal studies:  ESR, TSH, free T4.  Ferritin was 345.  Iron studies included a saturation of 25% and TIBC of 246. CBC on 06/15/2016 revealed a hematocrit of 38.5, hemoglobin 12.1, MCV 76.3, platelets 227,000, WBC 26,800 with an ANC of 4300.  Absolute lymphocyte count was 21,100.  CMP, LDH, and uric acid were normal.  During the interim,    Past Medical History:  Diagnosis Date  . Anemia 2008  . Arthritis   . Hyperlipidemia     Past Surgical History:  Procedure Laterality Date  . COLONOSCOPY    . COLONOSCOPY WITH PROPOFOL N/A 07/23/2015   Procedure: COLONOSCOPY WITH PROPOFOL;  Surgeon: [name], MD;  Location: [facility] ENDOSCOPY;  Service: Endoscopy;  Laterality: N/A;  . ESOPHAGOGASTRODUODENOSCOPY (EGD) WITH PROPOFOL N/A 08/20/2015   Procedure: ESOPHAGOGASTRODUODENOSCOPY (EGD) WITH PROPOFOL;  Surgeon: [name], MD;  Location: [facility] ENDOSCOPY;  Service: Endoscopy;  Laterality: N/A;  . lymp node removal Right 2011   arm  . LYMPH NODE BIOPSY  08/2014    No family history on file.  Social History:  reports that he has been smoking Cigars.  He has never used smokeless tobacco. He reports that he does not drink alcohol or use drugs.  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  He works 11-12 hours a day. The patient is accompanied by his wife, [name], today.  Allergies:  Allergies  Allergen Reactions  . Latex Rash    Current Medications: Current Outpatient Prescriptions  Medication Sig Dispense Refill  . iron polysaccharides (NIFEREX) 150 MG capsule Take 1 capsule (150 mg total) by mouth daily. Reported on 06/17/2016 30 capsule 3  . naproxen sodium (ANAPROX) 220 MG tablet Take 220 mg by mouth as needed. Reported on 06/17/2016     No current facility-administered medications for this visit.     Review of Systems:  GENERAL:  Feels "ok".  No fevers or sweats.  Some cold chills.  Weight loss of 1 pound. PERFORMANCE STATUS (ECOG):  1 HEENT:  No visual changes, runny nose, sore throat, mouth sores or tenderness. Lungs: No shortness of breath or cough.  No hemoptysis. Cardiac:  No chest pain, palpitations, orthopnea, or PND. GI: Appetite is up and down.  No nausea, vomiting, diarrhea, constipation, melena or hematochezia. GU:  No urgency, frequency, dysuria, or hematuria.  Needs PSA checked. Musculoskeletal:  Back pain.  Arthritis.  No muscle tenderness. Extremities:  No pain or swelling. Skin:  No rashes or skin changes. Neuro:  No headache, numbness or weakness, balance or coordination issues. Endocrine:  No diabetes, thyroid issues, hot flashes or night sweats. Psych:  No mood changes, depression or anxiety. Pain:  No focal pain. Review of  systems:  All other systems reviewed and found to be negative.   Physical Exam: There were no vitals taken for this visit. GENERAL:  Well developed, well nourished, gentleman sitting comfortably in the exam room in no acute distress. MENTAL STATUS:  Alert and oriented to person, place and time. HEAD:  Alopecia.  Graying goatee.  Normocephalic, atraumatic, face symmetric, no Cushingoid features. EYES:  Brown eyes.  Pupils equal round and reactive to light and accomodation.  No conjunctivitis  or scleral icterus. ENT:  Oropharynx clear without lesion.  Dentures.  Tongue normal. Mucous membranes moist.  RESPIRATORY:  Clear to auscultation without rales, wheezes or rhonchi. CARDIOVASCULAR:  Regular rate and rhythm without murmur, rub or gallop. ABDOMEN:  Soft, non-tender, with active bowel sounds, and no hepatosplenomegaly.  No masses. SKIN:  No rashes, ulcers or lesions. EXTREMITIES: No edema, no skin discoloration or tenderness.  No palpable cords. LYMPH NODES: 2 cm left axillary lymph node.  No palpable cervical, supraclavicular, or inguinal adenopathy  NEUROLOGICAL: Unremarkable. PSYCH:  Appropriate.   No visits with results within 3 Day(s) from this visit.  Latest known visit with results is:  Office Visit on 07/01/2016  Component Date Value Ref Range Status  . TSH 07/01/2016 2.463  0.350 - 4.500 uIU/mL Final  . Free T4 07/01/2016 0.94  0.61 - 1.12 ng/dL Final   Comment: (NOTE) Biotin ingestion may interfere with free T4 tests. If the results are inconsistent with the TSH level, previous test results, or the clinical presentation, then consider biotin interference. If needed, order repeat testing after stopping biotin.   . Ferritin 07/01/2016 345* 24 - 336 ng/mL Final  . Iron 07/01/2016 61  45 - 182 ug/dL Final  . TIBC 07/01/2016 246* 250 - 450 ug/dL Final  . Saturation Ratios 07/01/2016 25  17.9 - 39.5 % Final  . UIBC 07/01/2016 185  ug/dL Final  . Sed Rate 07/01/2016 17  0 - 20 mm/hr Final    Assessment:  [name] is a 65 y.o. 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  He was treated with prednisone. With taper of prednisone, his hemolysis returned.  Chest and abdomen CT scan on 02/09/2014 revealed bulky bilateral axillary adenopathy and bulky intraperitoneal and retroperitoneal adenopathy. PET scan on 07/11/2014 revealed mildly hypermetabolic lymph nodes in the chest, abdomen, and pelvis and mildly  hypermetabolic spleen.   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  He received 4 weekly cycles of Rituxan (10/02/2014 - 10/23/2014). His hematocrit normalized.  He has slowly progressive microcytic anemia.  Labs on 07/01/2016 revealed the following normal studies:  ESR (17), TSH, free T4, ferritin (345), and iron studies.   Retic was 1.5%.   Colonoscopy on 07/23/2015 revealed diverticulosis in the sigmoid colon.  EGD on 08/20/2015 was normal.  Guaiac cards x 3 were negative in 06/2015.  His diet fluctuates with his appetite.  He denies any melana or hematochezia.  PET scan on 06/24/2016 revealed bilateral moderate hypermetabolic axillary lymphadenopathy increased in size (1.6 cm compared to 1.3 cm) and metabolism (SUV 3.9 compared to 3.6).  There were bilateral moderate hypermetabolic external iliac lymphadenopathy stable to decreased in metabolism.  There was mesenteric and retroperitoneal lymphadenopathy stable in size and non hypermetabolic.  There were no new sites of hypermetabolic lymphoma.  There were subcentimeter pulmonary nodules stable and below PET resolution.  Symptomatically, he denies any B symptoms.  He is working long hours with  little opportunity to eat.  Weight is down 10 pounds in the past year.  Exam reveals no adenopathy.  Plan: 1.  Labs today:  CBC with diff, BMP, LDH, uric acid, ferritin.  3.  Discuss importance of caloric intake. 4.  RTC in 1 month for MD assessment and labs (CBC with diff, BMP, LDH, uric acid, ferritin).   Lequita Asal, MD  07/28/2016, 8:33 PM

## 2016-07-29 ENCOUNTER — Inpatient Hospital Stay: Payer: BLUE CROSS/BLUE SHIELD | Admitting: Hematology and Oncology

## 2016-07-29 ENCOUNTER — Inpatient Hospital Stay: Payer: BLUE CROSS/BLUE SHIELD | Attending: Hematology and Oncology

## 2016-08-28 ENCOUNTER — Telehealth: Payer: Self-pay | Admitting: *Deleted

## 2016-08-28 NOTE — Telephone Encounter (Signed)
Per Dr Mike Gip, No prednisone will be ordered. Attempted several times to contact patient, but he has no VM

## 2016-08-28 NOTE — Telephone Encounter (Signed)
Asking about cost of iron pills being $300. States he has not been taking any for a long time and went to fill July prescription, advised to take OTC ferrous sulfate 325 mg. He then asked about getting a prescription for Prednisone to help with his fatigue and energy levels. States he had taken it in the past and it helped. I educated on the long term effects of prolonged use of steroids, he is interested in trying it if md will agree

## 2016-09-20 DIAGNOSIS — S90425A Blister (nonthermal), left lesser toe(s), initial encounter: Secondary | ICD-10-CM | POA: Diagnosis not present

## 2016-09-20 DIAGNOSIS — L03116 Cellulitis of left lower limb: Secondary | ICD-10-CM | POA: Diagnosis not present

## 2016-10-14 ENCOUNTER — Ambulatory Visit (INDEPENDENT_AMBULATORY_CARE_PROVIDER_SITE_OTHER): Payer: BLUE CROSS/BLUE SHIELD | Admitting: Family Medicine

## 2016-10-14 ENCOUNTER — Encounter (INDEPENDENT_AMBULATORY_CARE_PROVIDER_SITE_OTHER): Payer: Self-pay

## 2016-10-14 ENCOUNTER — Encounter: Payer: Self-pay | Admitting: Family Medicine

## 2016-10-14 ENCOUNTER — Telehealth: Payer: Self-pay

## 2016-10-14 VITALS — BP 136/60 | HR 50 | Temp 97.5°F | Ht 67.5 in | Wt 138.5 lb

## 2016-10-14 DIAGNOSIS — R634 Abnormal weight loss: Secondary | ICD-10-CM

## 2016-10-14 DIAGNOSIS — N401 Enlarged prostate with lower urinary tract symptoms: Secondary | ICD-10-CM

## 2016-10-14 DIAGNOSIS — D509 Iron deficiency anemia, unspecified: Secondary | ICD-10-CM

## 2016-10-14 DIAGNOSIS — E785 Hyperlipidemia, unspecified: Secondary | ICD-10-CM

## 2016-10-14 DIAGNOSIS — N400 Enlarged prostate without lower urinary tract symptoms: Secondary | ICD-10-CM | POA: Insufficient documentation

## 2016-10-14 DIAGNOSIS — I2510 Atherosclerotic heart disease of native coronary artery without angina pectoris: Secondary | ICD-10-CM | POA: Diagnosis not present

## 2016-10-14 DIAGNOSIS — I739 Peripheral vascular disease, unspecified: Secondary | ICD-10-CM

## 2016-10-14 DIAGNOSIS — R350 Frequency of micturition: Secondary | ICD-10-CM

## 2016-10-14 DIAGNOSIS — C9110 Chronic lymphocytic leukemia of B-cell type not having achieved remission: Secondary | ICD-10-CM

## 2016-10-14 DIAGNOSIS — J439 Emphysema, unspecified: Secondary | ICD-10-CM | POA: Insufficient documentation

## 2016-10-14 LAB — CBC WITH DIFFERENTIAL/PLATELET
BASOS ABS: 0.1 10*3/uL (ref 0.0–0.1)
Basophils Relative: 0.7 % (ref 0.0–3.0)
EOS ABS: 0.4 10*3/uL (ref 0.0–0.7)
Eosinophils Relative: 1.7 % (ref 0.0–5.0)
HEMATOCRIT: 36 % — AB (ref 39.0–52.0)
HEMOGLOBIN: 11.4 g/dL — AB (ref 13.0–17.0)
LYMPHS PCT: 77.5 % — AB (ref 12.0–46.0)
Lymphs Abs: 16.3 10*3/uL — ABNORMAL HIGH (ref 0.7–4.0)
MCHC: 31.8 g/dL (ref 30.0–36.0)
MCV: 76.6 fl — ABNORMAL LOW (ref 78.0–100.0)
MONO ABS: 0.6 10*3/uL (ref 0.1–1.0)
Monocytes Relative: 2.9 % — ABNORMAL LOW (ref 3.0–12.0)
Neutro Abs: 3.6 10*3/uL (ref 1.4–7.7)
Neutrophils Relative %: 17.2 % — ABNORMAL LOW (ref 43.0–77.0)
Platelets: 224 10*3/uL (ref 150.0–400.0)
RBC: 4.7 Mil/uL (ref 4.22–5.81)
RDW: 16.6 % — ABNORMAL HIGH (ref 11.5–15.5)

## 2016-10-14 LAB — LIPID PANEL
CHOL/HDL RATIO: 5
Cholesterol: 163 mg/dL (ref 0–200)
HDL: 32.4 mg/dL — ABNORMAL LOW (ref >39.00)
LDL CALC: 113 mg/dL — AB (ref 0–99)
NONHDL: 130.5
TRIGLYCERIDES: 88 mg/dL (ref 0.0–149.0)
VLDL: 17.6 mg/dL (ref 0.0–40.0)

## 2016-10-14 LAB — COMPREHENSIVE METABOLIC PANEL
ALT: 11 U/L (ref 0–53)
AST: 19 U/L (ref 0–37)
Albumin: 4.1 g/dL (ref 3.5–5.2)
Alkaline Phosphatase: 47 U/L (ref 39–117)
BILIRUBIN TOTAL: 0.5 mg/dL (ref 0.2–1.2)
BUN: 13 mg/dL (ref 6–23)
CO2: 28 meq/L (ref 19–32)
CREATININE: 1.04 mg/dL (ref 0.40–1.50)
Calcium: 9.5 mg/dL (ref 8.4–10.5)
Chloride: 106 mEq/L (ref 96–112)
GFR: 92.69 mL/min (ref >60.00)
GLUCOSE: 79 mg/dL (ref 70–99)
Potassium: 4.1 mEq/L (ref 3.5–5.1)
SODIUM: 139 meq/L (ref 135–145)
TOTAL PROTEIN: 7.8 g/dL (ref 6.0–8.3)

## 2016-10-14 LAB — FERRITIN: FERRITIN: 270.3 ng/mL (ref 22.0–322.0)

## 2016-10-14 LAB — URIC ACID: URIC ACID, SERUM: 4.5 mg/dL (ref 4.0–7.8)

## 2016-10-14 LAB — IRON AND TIBC
%SAT: 29 % (ref 15–60)
IRON: 70 ug/dL (ref 50–180)
TIBC: 240 ug/dL — AB (ref 250–425)
UIBC: 170 ug/dL (ref 125–400)

## 2016-10-14 LAB — HEMOGLOBIN A1C: Hgb A1c MFr Bld: 5.8 % (ref 4.6–6.5)

## 2016-10-14 LAB — LACTATE DEHYDROGENASE: LDH: 127 U/L (ref 120–250)

## 2016-10-14 MED ORDER — TAMSULOSIN HCL 0.4 MG PO CAPS: 0.4000 mg | ORAL_CAPSULE | Freq: Every day | ORAL | 3 refills | Status: DC

## 2016-10-14 NOTE — Assessment & Plan Note (Signed)
Per history. Labs today.

## 2016-10-14 NOTE — Assessment & Plan Note (Signed)
Patient endorses having angiography. We'll await lipid panel prior to starting aspirin and statin.

## 2016-10-14 NOTE — Progress Notes (Addendum)
Subjective:  Patient ID: [name], male    DOB: 1951/09/10  Age: 65 y.o. MRN: [id number]  CC: Establish care  HPI [name] is a 65 y.o. male a past medical history of anemia, B-cell lymphoma, PVD, HLD, arthritis presents to establish care. Issues/concerns are below.  Weight loss  This is the patient's primary concern.  Patient reports ongoing weight loss.  This is been going on for months.  This was reported to his oncologist as well.  He is concerned about his ongoing weight loss. He does note that when he works he does not take much time to eat.  He states that he otherwise feels pretty well. He is quite concerned however especially in the setting of cancer.  He would like to discuss this in depth today.  B cell lymphoma  Is not on treatment currently.  The most recent note from oncology reflects that this is stable.  However, his most recent PET scan did reveal an increase in activity and size of axillary lymphadenopathy.  I am unclear of why he is not being treated at this time.  Anemia  Chronic ongoing issue.  Has history of hemolytic anemia.  Has been stable.  He is concerned about the status of this as well.  PMH, Surgical Hx, Family Hx, Social History reviewed and updated as below.  Past Medical History:  Diagnosis Date  . Anemia 2008  . Arthritis   . 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 Hyperlipidemia   . Peripheral vascular disease [facility])    Past Surgical History:  Procedure Laterality Date  . COLONOSCOPY    . COLONOSCOPY WITH PROPOFOL N/A 07/23/2015   Procedure: COLONOSCOPY WITH PROPOFOL;  Surgeon: [name], MD;  Location[facility] ENDOSCOPY;  Service: Endoscopy;  Laterality: N/A;  . ESOPHAGOGASTRODUODENOSCOPY (EGD) WITH PROPOFOL N/A 08/20/2015   Procedure: ESOPHAGOGASTRODUODENOSCOPY (EGD) WITH PROPOFOL;  Surgeon: [name], MD;  Location: [facility] ENDOSCOPY;  Service: Endoscopy;  Laterality: N/A;  . lymp  node removal Right 2011   arm  . LYMPH NODE BIOPSY  08/2014   No family history on file. Social History  Substance Use Topics  . Smoking status: Current Some Day Smoker    Types: Cigars  . Smokeless tobacco: Never Used  . Alcohol use No    Review of Systems  Constitutional: Positive for unexpected weight change.  HENT: Positive for hearing loss.   Eyes: Positive for visual disturbance.  Respiratory: Positive for cough.   Cardiovascular: Positive for palpitations.  Gastrointestinal: Positive for abdominal pain and diarrhea.  Endocrine: Positive for polydipsia.  Genitourinary: Positive for frequency.  Neurological: Positive for dizziness, weakness, numbness and headaches.  Psychiatric/Behavioral:       Stress.  All other systems reviewed and are negative.  Objective:   Today's Vitals: BP 136/60 (BP Location: Right Arm, Patient Position: Sitting, Cuff Size: Normal)   Pulse (!) 50   Temp 97.5 F (36.4 C) (Oral)   Ht 5' 7.5" (1.715 m)   Wt 138 lb 8 oz (62.8 kg)   SpO2 98%   BMI 21.37 kg/m   Physical Exam  Constitutional: He is oriented to person, place, and time. He appears well-developed. No distress.  HENT:  Head: Normocephalic.  Mouth/Throat: Oropharynx is clear and moist.  Eyes: Conjunctivae are normal.  Neck: Neck supple.  Cardiovascular: Normal rate and regular rhythm.   Pulmonary/Chest: Effort normal. He has no wheezes. He has no rales.  Abdominal: Soft. He exhibits no distension. There  is no tenderness. There is no rebound and no guarding.  Musculoskeletal: Normal range of motion.  Lymphadenopathy:  L axillary lymphadenopathy. Bilateral inguinal lymphadenopathy.  Neurological: He is alert and oriented to person, place, and time.  Skin: No rash noted.  Psychiatric: He has a normal mood and affect.  Vitals reviewed.  Assessment & Plan:   Problem List Items Addressed This Visit    Weight loss    Ongoing. Secondary to malignancy. Also likely a component  of dietary issues (not much intake at work). Advised to increase protein (especially at work). Advised frequent meals/snacks. Malignancy needs to be addressed.      Relevant Orders   HgB A1c   Peripheral vascular disease ([place])    Patient endorses having angiography. We'll await lipid panel prior to starting aspirin and statin.      Microcytic anemia    Ongoing issue. New to me. Appears stable. Obtaining labs today.      Relevant Orders   Lactate Dehydrogenase (LDH)   CBC w/Diff   Uric acid   Ferritin   Iron Binding Cap (TIBC)   Hyperlipidemia    Per history. Labs today.      Relevant Orders   Lipid Profile   Coronary atherosclerosis    Noted on PET scan. We'll ultimately need cardiology referral but is currently asymptomatic. Labs today. We'll plan to start aspirin and likely statin after labs return.      Relevant Orders   Comp Met (CMET)   Chronic lymphocytic leukemia (CLL), B-cell ([place]) - Primary    Not currently on treatment. His most recent PET scan reflects some worsening with an increase in metabolic activity and size of axillary lymphadenopathy. Patient states that his oncologist has not discussed much with him. He would like another opinion. Placing referral to Onc.      Relevant Medications   ibuprofen (ADVIL,MOTRIN) 800 MG tablet   cephALEXin (KEFLEX) 500 MG capsule   BPH (benign prostatic hyperplasia)    New problem. Patient having symptoms of BPH. Enlarged prostate noted on PET scan. Treating with Flomax.      Relevant Medications   tamsulosin (FLOMAX) 0.4 MG CAPS capsule    Other Visit Diagnoses   None.     Outpatient Encounter Prescriptions as of 10/14/2016  Medication Sig  . cephALEXin (KEFLEX) 500 MG capsule   . ibuprofen (ADVIL,MOTRIN) 800 MG tablet   . tamsulosin (FLOMAX) 0.4 MG CAPS capsule Take 1 capsule (0.4 mg total) by mouth daily.  . [DISCONTINUED] iron polysaccharides (NIFEREX) 150 MG capsule Take 1 capsule (150 mg total)  by mouth daily. Reported on 06/17/2016  . [DISCONTINUED] naproxen sodium (ANAPROX) 220 MG tablet Take 220 mg by mouth as needed. Reported on 06/17/2016   No facility-administered encounter medications on file as of 10/14/2016.     Follow-up: Return in about 1 month (around 11/14/2016).  [place]

## 2016-10-14 NOTE — Assessment & Plan Note (Signed)
Noted on PET scan. We'll ultimately need cardiology referral but is currently asymptomatic. Labs today. We'll plan to start aspirin and likely statin after labs return.

## 2016-10-14 NOTE — Assessment & Plan Note (Signed)
New problem. Patient having symptoms of BPH. Enlarged prostate noted on PET scan. Treating with Flomax.

## 2016-10-14 NOTE — Assessment & Plan Note (Signed)
Ongoing issue. New to me. Appears stable. Obtaining labs today.

## 2016-10-14 NOTE — Patient Instructions (Signed)
Stop smoking.  Take the Flomax (tamsilosin) daily for your prostate.  We will arrange for you to see another oncologist.  We will call regarding your lab results.  Be sure to eat regularly and often. Increase protein intake.  Follow-up in one month  Take care  Dr. Lacinda Axon

## 2016-10-14 NOTE — Assessment & Plan Note (Signed)
Ongoing. Secondary to malignancy. Also likely a component of dietary issues (not much intake at work). Advised to increase protein (especially at work). Advised frequent meals/snacks. Malignancy needs to be addressed.

## 2016-10-14 NOTE — Progress Notes (Signed)
Pre visit review using our clinic review tool, if applicable. No additional management support is needed unless otherwise documented below in the visit note. 

## 2016-10-14 NOTE — Addendum Note (Signed)
Addended by: Coral Spikes on: 10/14/2016 12:53 PM   Modules accepted: Orders

## 2016-10-14 NOTE — Assessment & Plan Note (Signed)
Not currently on treatment. His most recent PET scan reflects some worsening with an increase in metabolic activity and size of axillary lymphadenopathy. Patient states that his oncologist has not discussed much with him. He would like another opinion. Placing referral to Onc.

## 2016-10-14 NOTE — Telephone Encounter (Signed)
[facility] [phone] calls with Critical WBC count of 21.0

## 2016-10-14 NOTE — Telephone Encounter (Signed)
Aware. Elevated WBC count in setting of malignancy.

## 2016-10-21 ENCOUNTER — Inpatient Hospital Stay: Payer: BLUE CROSS/BLUE SHIELD | Admitting: Hematology and Oncology

## 2016-10-21 ENCOUNTER — Inpatient Hospital Stay: Payer: BLUE CROSS/BLUE SHIELD | Attending: Hematology and Oncology

## 2016-10-21 DIAGNOSIS — R109 Unspecified abdominal pain: Secondary | ICD-10-CM | POA: Insufficient documentation

## 2016-10-21 DIAGNOSIS — D588 Other specified hereditary hemolytic anemias: Secondary | ICD-10-CM | POA: Insufficient documentation

## 2016-10-21 DIAGNOSIS — F17210 Nicotine dependence, cigarettes, uncomplicated: Secondary | ICD-10-CM | POA: Insufficient documentation

## 2016-10-21 DIAGNOSIS — R634 Abnormal weight loss: Secondary | ICD-10-CM | POA: Insufficient documentation

## 2016-10-21 DIAGNOSIS — C9110 Chronic lymphocytic leukemia of B-cell type not having achieved remission: Secondary | ICD-10-CM | POA: Insufficient documentation

## 2016-10-21 DIAGNOSIS — I739 Peripheral vascular disease, unspecified: Secondary | ICD-10-CM | POA: Insufficient documentation

## 2016-10-21 DIAGNOSIS — M129 Arthropathy, unspecified: Secondary | ICD-10-CM | POA: Insufficient documentation

## 2016-10-21 DIAGNOSIS — N400 Enlarged prostate without lower urinary tract symptoms: Secondary | ICD-10-CM | POA: Insufficient documentation

## 2016-10-21 DIAGNOSIS — R918 Other nonspecific abnormal finding of lung field: Secondary | ICD-10-CM | POA: Insufficient documentation

## 2016-10-21 DIAGNOSIS — Z79899 Other long term (current) drug therapy: Secondary | ICD-10-CM | POA: Insufficient documentation

## 2016-10-21 DIAGNOSIS — E785 Hyperlipidemia, unspecified: Secondary | ICD-10-CM | POA: Insufficient documentation

## 2016-10-21 DIAGNOSIS — N3941 Urge incontinence: Secondary | ICD-10-CM | POA: Insufficient documentation

## 2016-10-21 DIAGNOSIS — K5730 Diverticulosis of large intestine without perforation or abscess without bleeding: Secondary | ICD-10-CM | POA: Insufficient documentation

## 2016-10-21 NOTE — Progress Notes (Unsigned)
[facility] day:  10/21/16    Chief Complaint: [name] is a 65 y.o. male with lymphocytic lymphoma with associated hemolytic anemia seen for review of interval PET scan.  HPI:  The patient was last seen in the medical onocology clinic on 07/01/2016.  At that time, PET scan from 06/24/2016 revealed bilateral moderate hypermetabolic axillary lymphadenopathy increased in size (1.6 cm compared to 1.3 cm) and metabolism (SUV 3.9 compared to 3.6).  There were bilateral moderate hypermetabolic external iliac lymphadenopathy stable to decreased in metabolism.  There was mesenteric and retroperitoneal lymphadenopathy  stable in size and non hypermetabolic.  There were no new sites of hypermetabolic lymphoma.  There were subcentimeter pulmonary nodules stable and below PET resolution.    Labs on 06/17/2016 included a hematocrit 38.5, hemoglobin 12.1, MCV 76.3, platelets 227,000, white count 26,800 with an Carroll of 4300. Absolute lymphocyte count was 21,100.  Upper hence a metabolic panel was normal. LDH was 136 with a uric acid of 5.9 (normal).  Retic was 1.5%.  Additional labs on 07/01/2016 revealed a ferritin of 345, iron studies with a 25% and a TIBC of 246 (low).  Sedimentation rate was 17. TSH was 2.463 with a free T4 0.94.  Symptomatically, he is fatigued.  He works 11-12 hour days (8AM to 8PM or 3 AM to 3 PM).  He does not have much of a chance to eat during work.  He describes his appetite as up and down.  He has had some cold chills.  He has back issues (none in 3 days).  He denies any diarrhea.    Past Medical History:  Diagnosis Date  . Anemia 2008  . Arthritis   . 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 Hyperlipidemia   . Peripheral vascular disease [facility])     Past Surgical History:  Procedure Laterality Date  . COLONOSCOPY    . COLONOSCOPY WITH PROPOFOL N/A 07/23/2015   Procedure: COLONOSCOPY WITH PROPOFOL;  Surgeon:  [name], MD;  Location[facility] ENDOSCOPY;  Service: Endoscopy;  Laterality: N/A;  . ESOPHAGOGASTRODUODENOSCOPY (EGD) WITH PROPOFOL N/A 08/20/2015   Procedure: ESOPHAGOGASTRODUODENOSCOPY (EGD) WITH PROPOFOL;  Surgeon: [name], MD;  Location: [facility] ENDOSCOPY;  Service: Endoscopy;  Laterality: N/A;  . lymp node removal Right 2011   arm  . LYMPH NODE BIOPSY  08/2014    No family history on file.  Social History:  reports that he has been smoking Cigars.  He has never used smokeless tobacco. He reports that he does not drink alcohol or use drugs.  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  He works 11-12 hours a day. The patient is accompanied by his wife, [name], today.  Allergies:  Allergies  Allergen Reactions  . Latex Rash    Current Medications: Current Outpatient Prescriptions  Medication Sig Dispense Refill  . cephALEXin (KEFLEX) 500 MG capsule   0  . ibuprofen (ADVIL,MOTRIN) 800 MG tablet   0  . tamsulosin (FLOMAX) 0.4 MG CAPS capsule Take 1 capsule (0.4 mg total) by mouth daily. 90 capsule 3   No current facility-administered medications for this visit.     Review of Systems:  GENERAL:  Feels "ok".  No fevers or sweats.  Some cold chills.  Weight loss of 1 pound. PERFORMANCE STATUS (ECOG):  1 HEENT:  No visual changes, runny nose, sore throat, mouth sores or tenderness. Lungs: No  shortness of breath or cough.  No hemoptysis. Cardiac:  No chest pain, palpitations, orthopnea, or PND. GI: Appetite is up and down.  No nausea, vomiting, diarrhea, constipation, melena or hematochezia. GU:  No urgency, frequency, dysuria, or hematuria.  Needs PSA checked. Musculoskeletal:  Back pain.  Arthritis.  No muscle tenderness. Extremities:  No pain or swelling. Skin:  No rashes or skin changes. Neuro:  No headache, numbness or weakness, balance or coordination issues. Endocrine:  No diabetes, thyroid issues, hot flashes or  night sweats. Psych:  No mood changes, depression or anxiety. Pain:  No focal pain. Review of systems:  All other systems reviewed and found to be negative.   Physical Exam: There were no vitals taken for this visit. GENERAL:  Well developed, well nourished, gentleman sitting comfortably in the exam room in no acute distress. MENTAL STATUS:  Alert and oriented to person, place and time. HEAD:  Alopecia.  Graying goatee.  Normocephalic, atraumatic, face symmetric, no Cushingoid features. EYES:  Brown eyes.  No conjunctivitis or scleral icterus. NEUROLOGICAL: Unremarkable. PSYCH:  Appropriate.  No visits with results within 3 Day(s) from this visit.  Latest known visit with results is:  Office Visit on 10/14/2016  Component Date Value Ref Range Status  . Sodium 10/14/2016 139  135 - 145 mEq/L Final  . Potassium 10/14/2016 4.1  3.5 - 5.1 mEq/L Final  . Chloride 10/14/2016 106  96 - 112 mEq/L Final  . CO2 10/14/2016 28  19 - 32 mEq/L Final  . Glucose, Bld 10/14/2016 79  70 - 99 mg/dL Final  . BUN 10/14/2016 13  6 - 23 mg/dL Final  . Creatinine, Ser 10/14/2016 1.04  0.40 - 1.50 mg/dL Final  . Total Bilirubin 10/14/2016 0.5  0.2 - 1.2 mg/dL Final  . Alkaline Phosphatase 10/14/2016 47  39 - 117 U/L Final  . AST 10/14/2016 19  0 - 37 U/L Final  . ALT 10/14/2016 11  0 - 53 U/L Final  . Total Protein 10/14/2016 7.8  6.0 - 8.3 g/dL Final  . Albumin 10/14/2016 4.1  3.5 - 5.2 g/dL Final  . Calcium 10/14/2016 9.5  8.4 - 10.5 mg/dL Final  . GFR 10/14/2016 92.69  >60.00 mL/min Final  . Hgb A1c MFr Bld 10/14/2016 5.8  4.6 - 6.5 % Final  . Cholesterol 10/14/2016 163  0 - 200 mg/dL Final  . Triglycerides 10/14/2016 88.0  0.0 - 149.0 mg/dL Final  . HDL 10/14/2016 32.40* >39.00 mg/dL Final  . VLDL 10/14/2016 17.6  0.0 - 40.0 mg/dL Final  . LDL Cholesterol 10/14/2016 113* 0 - 99 mg/dL Final  . Total CHOL/HDL Ratio 10/14/2016 5   Final  . NonHDL 10/14/2016 130.50   Final  . LDH 10/14/2016 127  120  - 250 U/L Final  . WBC 10/14/2016 21.0 cH* 4.0 - 10.5 K/uL Final  . RBC 10/14/2016 4.70  4.22 - 5.81 Mil/uL Final  . Hemoglobin 10/14/2016 11.4* 13.0 - 17.0 g/dL Final  . HCT 10/14/2016 36.0* 39.0 - 52.0 % Final  . MCV 10/14/2016 76.6* 78.0 - 100.0 fl Final  . MCHC 10/14/2016 31.8  30.0 - 36.0 g/dL Final  . RDW 10/14/2016 16.6* 11.5 - 15.5 % Final  . Platelets 10/14/2016 224.0  150.0 - 400.0 K/uL Final  . Neutrophils Relative % 10/14/2016 17.2* 43.0 - 77.0 % Final  . Lymphocytes Relative 10/14/2016 77.5* 12.0 - 46.0 % Final  . Monocytes Relative 10/14/2016 2.9* 3.0 - 12.0 % Final  . Eosinophils  Relative 10/14/2016 1.7  0.0 - 5.0 % Final  . Basophils Relative 10/14/2016 0.7  0.0 - 3.0 % Final  . Neutro Abs 10/14/2016 3.6  1.4 - 7.7 K/uL Final  . Lymphs Abs 10/14/2016 16.3* 0.7 - 4.0 K/uL Final  . Monocytes Absolute 10/14/2016 0.6  0.1 - 1.0 K/uL Final  . Eosinophils Absolute 10/14/2016 0.4  0.0 - 0.7 K/uL Final  . Basophils Absolute 10/14/2016 0.1  0.0 - 0.1 K/uL Final  . Uric Acid, Serum 10/14/2016 4.5  4.0 - 7.8 mg/dL Final  . Ferritin 10/14/2016 270.3  22.0 - 322.0 ng/mL Final  . Iron 10/14/2016 70  50 - 180 ug/dL Final  . UIBC 10/14/2016 170  125 - 400 ug/dL Final  . TIBC 10/14/2016 240* 250 - 425 ug/dL Final  . %SAT 10/14/2016 29  15 - 60 % Final    Assessment:  [name] is a 65 y.o. 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  He was treated with prednisone. With taper of prednisone, his hemolysis returned.  Chest and abdomen CT scan on 02/09/2014 revealed bulky bilateral axillary adenopathy and bulky intraperitoneal and retroperitoneal adenopathy. PET scan on 07/11/2014 revealed mildly hypermetabolic lymph nodes in the chest, abdomen, and pelvis and mildly hypermetabolic spleen.   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  He received 4 weekly cycles of Rituxan  (10/02/2014 - 10/23/2014). His hematocrit normalized.  He has slowly progressive microcytic anemia.  Colonoscopy on 07/23/2015 revealed diverticulosis in the sigmoid colon.  EGD on 08/20/2015 was normal.  Guaiac cards x 3 were negative in 06/2015.  His diet fluctuates with his appetite.  He denies any melana or hematochezia.  PET scan on 06/24/2016 revealed bilateral moderate hypermetabolic axillary lymphadenopathy increased in size (1.6 cm compared to 1.3 cm) and metabolism (SUV 3.9 compared to 3.6).  There were bilateral moderate hypermetabolic external iliac lymphadenopathy stable to decreased in metabolism.  There was mesenteric and retroperitoneal lymphadenopathy stable in size and non hypermetabolic.  There were no new sites of hypermetabolic lymphoma.  There were subcentimeter pulmonary nodules stable and below PET resolution.  Symptomatically, he denies any B symptoms.  He is working long hours with little opportunity to eat.  Weight is down 10 pounds in the past year.  Exam reveals no adenopathy.  Plan: 1.  Review PET scan.  Discuss relatively stable disease without clear indication for treatment. 2.  Labs today:  TSH, free T4, ferritin, iron studies, ESR. 3.  Discuss importance of caloric intake. 4.  RTC in 1 month for MD assessment and labs (CBC with diff, BMP, LDH, uric acid, ferritin).   Lequita Asal, MD  10/21/2016, 6:49 AM

## 2016-10-28 ENCOUNTER — Other Ambulatory Visit: Payer: BLUE CROSS/BLUE SHIELD

## 2016-10-28 ENCOUNTER — Ambulatory Visit: Payer: BLUE CROSS/BLUE SHIELD | Admitting: Hematology and Oncology

## 2016-11-02 ENCOUNTER — Inpatient Hospital Stay: Payer: BLUE CROSS/BLUE SHIELD

## 2016-11-02 ENCOUNTER — Telehealth: Payer: Self-pay | Admitting: *Deleted

## 2016-11-02 ENCOUNTER — Encounter: Payer: Self-pay | Admitting: Hematology and Oncology

## 2016-11-02 ENCOUNTER — Inpatient Hospital Stay (HOSPITAL_BASED_OUTPATIENT_CLINIC_OR_DEPARTMENT_OTHER): Payer: BLUE CROSS/BLUE SHIELD | Admitting: Hematology and Oncology

## 2016-11-02 VITALS — BP 102/62 | HR 54 | Temp 95.5°F | Resp 18 | Wt 137.8 lb

## 2016-11-02 DIAGNOSIS — Z79899 Other long term (current) drug therapy: Secondary | ICD-10-CM

## 2016-11-02 DIAGNOSIS — D649 Anemia, unspecified: Secondary | ICD-10-CM

## 2016-11-02 DIAGNOSIS — F17210 Nicotine dependence, cigarettes, uncomplicated: Secondary | ICD-10-CM | POA: Diagnosis not present

## 2016-11-02 DIAGNOSIS — K5730 Diverticulosis of large intestine without perforation or abscess without bleeding: Secondary | ICD-10-CM | POA: Diagnosis not present

## 2016-11-02 DIAGNOSIS — M129 Arthropathy, unspecified: Secondary | ICD-10-CM | POA: Diagnosis not present

## 2016-11-02 DIAGNOSIS — C9110 Chronic lymphocytic leukemia of B-cell type not having achieved remission: Secondary | ICD-10-CM

## 2016-11-02 DIAGNOSIS — E785 Hyperlipidemia, unspecified: Secondary | ICD-10-CM

## 2016-11-02 DIAGNOSIS — R634 Abnormal weight loss: Secondary | ICD-10-CM

## 2016-11-02 DIAGNOSIS — N3941 Urge incontinence: Secondary | ICD-10-CM | POA: Diagnosis not present

## 2016-11-02 DIAGNOSIS — D588 Other specified hereditary hemolytic anemias: Secondary | ICD-10-CM

## 2016-11-02 DIAGNOSIS — D509 Iron deficiency anemia, unspecified: Secondary | ICD-10-CM

## 2016-11-02 DIAGNOSIS — N400 Enlarged prostate without lower urinary tract symptoms: Secondary | ICD-10-CM

## 2016-11-02 DIAGNOSIS — R109 Unspecified abdominal pain: Secondary | ICD-10-CM | POA: Diagnosis not present

## 2016-11-02 DIAGNOSIS — I739 Peripheral vascular disease, unspecified: Secondary | ICD-10-CM | POA: Diagnosis not present

## 2016-11-02 DIAGNOSIS — R918 Other nonspecific abnormal finding of lung field: Secondary | ICD-10-CM

## 2016-11-02 LAB — LACTATE DEHYDROGENASE: LDH: 114 U/L (ref 98–192)

## 2016-11-02 LAB — CBC WITH DIFFERENTIAL/PLATELET
Basophils Absolute: 0 10*3/uL (ref 0–0.1)
Basophils Relative: 0 %
Eosinophils Absolute: 0.4 10*3/uL (ref 0–0.7)
Eosinophils Relative: 2 %
HCT: 35.6 % — ABNORMAL LOW (ref 40.0–52.0)
Hemoglobin: 11.3 g/dL — ABNORMAL LOW (ref 13.0–18.0)
Lymphocytes Relative: 81 %
Lymphs Abs: 17.1 10*3/uL — ABNORMAL HIGH (ref 1.0–3.6)
MCH: 23.8 pg — ABNORMAL LOW (ref 26.0–34.0)
MCHC: 31.8 g/dL — ABNORMAL LOW (ref 32.0–36.0)
MCV: 74.9 fL — ABNORMAL LOW (ref 80.0–100.0)
Monocytes Absolute: 0.5 10*3/uL (ref 0.2–1.0)
Monocytes Relative: 3 %
Neutro Abs: 3.1 10*3/uL (ref 1.4–6.5)
Neutrophils Relative %: 14 %
Platelets: 205 10*3/uL (ref 150–440)
RBC: 4.76 MIL/uL (ref 4.40–5.90)
RDW: 15.7 % — ABNORMAL HIGH (ref 11.5–14.5)
WBC: 21.2 10*3/uL — ABNORMAL HIGH (ref 3.8–10.6)

## 2016-11-02 LAB — COMPREHENSIVE METABOLIC PANEL
ALT: 12 U/L — ABNORMAL LOW (ref 17–63)
AST: 19 U/L (ref 15–41)
Albumin: 3.8 g/dL (ref 3.5–5.0)
Alkaline Phosphatase: 43 U/L (ref 38–126)
Anion gap: 5 (ref 5–15)
BUN: 14 mg/dL (ref 6–20)
CO2: 25 mmol/L (ref 22–32)
Calcium: 8.5 mg/dL — ABNORMAL LOW (ref 8.9–10.3)
Chloride: 105 mmol/L (ref 101–111)
Creatinine, Ser: 1.15 mg/dL (ref 0.61–1.24)
GFR calc Af Amer: >60 mL/min (ref >60)
GFR calc non Af Amer: >60 mL/min (ref >60)
Glucose, Bld: 120 mg/dL — ABNORMAL HIGH (ref 65–99)
Potassium: 3.9 mmol/L (ref 3.5–5.1)
Sodium: 135 mmol/L (ref 135–145)
Total Bilirubin: 0.4 mg/dL (ref 0.3–1.2)
Total Protein: 7.1 g/dL (ref 6.5–8.1)

## 2016-11-02 LAB — RETICULOCYTES
RBC.: 4.76 MIL/uL (ref 4.40–5.90)
Retic Count, Absolute: 66.6 10*3/uL (ref 19.0–183.0)
Retic Ct Pct: 1.4 % (ref 0.4–3.1)

## 2016-11-02 LAB — URINALYSIS COMPLETE WITH MICROSCOPIC (ARMC ONLY)
Bacteria, UA: NONE SEEN
Bilirubin Urine: NEGATIVE
Glucose, UA: NEGATIVE mg/dL
Ketones, ur: NEGATIVE mg/dL
Leukocytes, UA: NEGATIVE
Nitrite: NEGATIVE
Protein, ur: NEGATIVE mg/dL
Specific Gravity, Urine: 1.02 (ref 1.005–1.030)
Squamous Epithelial / LPF: NONE SEEN
pH: 6 (ref 5.0–8.0)

## 2016-11-02 LAB — FERRITIN: Ferritin: 275 ng/mL (ref 24–336)

## 2016-11-02 LAB — URIC ACID: Uric Acid, Serum: 5.4 mg/dL (ref 4.4–7.6)

## 2016-11-02 NOTE — Patient Instructions (Signed)
Megestrol oral suspension What is this medicine? MEGESTROL (me JES trol) suspension improves appetite and helps cause weight gain. It is used in conditions that cause significant loss of appetite and weight, such as AIDS. This medicine may be used for other purposes; ask your health care provider or pharmacist if you have questions. What should I tell my health care provider before I take this medicine? They need to know if you have any of these conditions: -adrenal gland problems -history of blood clots of the legs, lungs, or other parts of the body -diabetes -kidney disease -liver disease -an unusual or allergic reaction to megestrol, other medicines, foods, dyes, or preservatives -pregnant or trying to get pregnant -breast-feeding How should I use this medicine? Take this medicine by mouth. Follow the directions on the prescription label. Shake well before using. Use a specially marked spoon or container to measure your medicine. Ask your pharmacist if you do not have one. Household spoons are not accurate. Take your medicine at regular intervals. Do not take your medicine more often than directed. Do not stop taking except on your doctor's advice. Contact your pediatrician or health care provider regarding the use of this medicine in children. Special care may be needed. Overdosage: If you think you have taken too much of this medicine contact a poison control center or emergency room at once. NOTE: This medicine is only for you. Do not share this medicine with others. What if I miss a dose? If you miss or forget a single dose, continue with your next regularly scheduled dose on the next day. Do not take double or extra doses. What may interact with this medicine? Do not take this medicine with any of the following medications: -dofetilide This medicine may also interact with the following  medications: -carbamazepine -indinavir -phenobarbital -phenytoin -primidone -rifampin -warfarin This list may not describe all possible interactions. Give your health care provider a list of all the medicines, herbs, non-prescription drugs, or dietary supplements you use. Also tell them if you smoke, drink alcohol, or use illegal drugs. Some items may interact with your medicine. What should I watch for while using this medicine? Visit your doctor or health care professional for regular checks on your progress. It may take up to 3 weeks to first notice an improved appetite. It may take up to 12 weeks to notice weight gain. If you are a male of child-bearing age, use an effective method of birth control while you are taking this medicine. This medicine should not be used by females who are pregnant or breast-feeding. There is a potential for serious side effects to an unborn child or to an infant. Talk to your health care professional or pharmacist for more information. This medicine may affect blood sugar levels. If you have diabetes, check with your doctor or health care professional before you change your diet or the dose of your diabetic medicine. What side effects may I notice from receiving this medicine? Side effects that you should report to your doctor or health care professional as soon as possible: -allergic reactions like skin rash, itching or hives, swelling of the face, lips, or tongue -difficulty breathing or shortness of breath -chest pain -dizziness -fluid retention -increased blood pressure -leg pain or swelling -nausea, vomiting -weakness Side effects that usually do not require medical attention (report to your doctor or health care professional if they continue or are bothersome): -breakthrough menstrual bleeding -diarrhea -gas -hot flashes or flushing -sexual difficulties in men This list may   not describe all possible side effects. Call your doctor for medical  advice about side effects. You may report side effects to FDA at 1-800-FDA-1088. Where should I keep my medicine? Keep out of the reach of children. Store at room temperature between 15 and 25 degrees C (59 and 77 degrees F). Keep tightly closed. Protect from heat. Throw away any unused medicine after the expiration date. NOTE: This sheet is a summary. It may not cover all possible information. If you have questions about this medicine, talk to your doctor, pharmacist, or health care provider.    2016, [employer]vier/[employer] Standard. (2008-06-25 15:58:31)  

## 2016-11-02 NOTE — Progress Notes (Signed)
Patient states he is having back pain that comes and goes.  States he will be walking along and the pain grabs him.

## 2016-11-02 NOTE — Telephone Encounter (Signed)
Tried calling patient to give him appt for dietician and for appt with [facility].  Patient's voicemail not set up.  Wife is the other contact and her number has been disconnected.  I have mailed the appt to his house and left sticky note about why pt needs to see the other doctors.

## 2016-11-02 NOTE — Progress Notes (Signed)
[facility] day:  11/02/16    Chief Complaint: [name] is a 65 y.o. male with lymphocytic lymphoma with associated hemolytic anemia who is seen for 3 month assessment.  HPI:  The patient was last seen in the medical onocology clinic on 07/01/2016.  At that time, PET scan from 06/24/2016 revealed bilateral moderate hypermetabolic axillary lymphadenopathy increased in size (1.6 cm compared to 1.3 cm) and metabolism (SUV 3.9 compared to 3.6).  There were bilateral moderate hypermetabolic external iliac lymphadenopathy stable to decreased in metabolism.  There was mesenteric and retroperitoneal lymphadenopathy  stable in size and non hypermetabolic.  There were no new sites of hypermetabolic lymphoma.  There were subcentimeter pulmonary nodules stable and below PET resolution.    Labs on 06/17/2016 included a hematocrit 38.5, hemoglobin 12.1, MCV 76.3, platelets 227,000, white count 26,800 with an Sikes of 4300. Absolute lymphocyte count was 21,100.  Comprehensive metabolic panel was normal. LDH was 136 with a uric acid of 5.9 (normal).  Retic was 1.5%.  Additional labs on 07/01/2016 revealed a ferritin of 345, iron studies with a 25% and a TIBC of 246 (low).  Sedimentation rate was 17. TSH was 2.463 with a free T4 0.94.  At last visit, he described working long hours at last visit.  He had lost 10 pounds in the past year.  Symptomatically, he has felt good.  He is eating better. Weight is up and down.  He notes at times a "catch across his abdomen" which feel like his abdomen is "locked up".  He denies any constipation. He has some urge incontinence.  He has an enlarged prostate.    Past Medical History:  Diagnosis Date  . Anemia 2008  . Arthritis   . Chronic lymphocytic leukemia (CLL), B-cell ([place]) 06/15/2015  . Hyperlipidemia   . Peripheral vascular disease [facility])     Past Surgical History:  Procedure Laterality Date  . COLONOSCOPY    .  COLONOSCOPY WITH PROPOFOL N/A 07/23/2015   Procedure: COLONOSCOPY WITH PROPOFOL;  Surgeon: [name], MD;  Location: [facility] ENDOSCOPY;  Service: Endoscopy;  Laterality: N/A;  . ESOPHAGOGASTRODUODENOSCOPY (EGD) WITH PROPOFOL N/A 08/20/2015   Procedure: ESOPHAGOGASTRODUODENOSCOPY (EGD) WITH PROPOFOL;  Surgeon: [name], MD;  Location: [facility] ENDOSCOPY;  Service: Endoscopy;  Laterality: N/A;  . lymp node removal Right 2011   arm  . LYMPH NODE BIOPSY  08/2014    History reviewed. No pertinent family history.  Social History:  reports that he has been smoking Cigars.  He has never used smokeless tobacco. He reports that he does not drink alcohol or use drugs.  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  He works 11-12 hours a day. The patient is alone today.  Allergies:  Allergies  Allergen Reactions  . Latex Rash    Current Medications: Current Outpatient Prescriptions  Medication Sig Dispense Refill  . cephALEXin (KEFLEX) 500 MG capsule   0  . ibuprofen (ADVIL,MOTRIN) 800 MG tablet   0  . tamsulosin (FLOMAX) 0.4 MG CAPS capsule Take 1 capsule (0.4 mg total) by mouth daily. 90 capsule 3   No current facility-administered medications for this visit.     Review of Systems:  GENERAL:  Feels "ok".  No fevers or sweats.  Weight down 2 pounds (up and down per patient). PERFORMANCE STATUS (ECOG):  1 HEENT:  No visual changes, runny nose, sore throat, mouth sores  or tenderness. Lungs: No shortness of breath or cough.  No hemoptysis. Cardiac:  No chest pain, palpitations, orthopnea, or PND. GI: Eating well.  Catch across abdomen.  No nausea, vomiting, diarrhea, constipation, melena or hematochezia. GU:  Urge incontinence.  No frequency, dysuria, or hematuria.  Enlarged prostate. Musculoskeletal:  Back pain.  Arthritis.  No muscle tenderness. Extremities:  No pain or swelling. Skin:  No rashes or skin changes. Neuro:  No headache,  numbness or weakness, balance or coordination issues. Endocrine:  No diabetes, thyroid issues, hot flashes or night sweats. Psych:  No mood changes, depression or anxiety. Pain:  No focal pain. Review of systems:  All other systems reviewed and found to be negative.   Physical Exam: Blood pressure 102/62, pulse (!) 54, temperature (!) 95.5 F (35.3 C), temperature source Tympanic, resp. rate 18, weight 137 lb 12.6 oz (62.5 kg). GENERAL:  Thin gentleman sitting comfortably in the exam room in no acute distress. MENTAL STATUS:  Alert and oriented to person, place and time. HEAD:  Wearing a yellow cap.  Alopecia.  Graying goatee.  Normocephalic, atraumatic, face symmetric, no Cushingoid features. EYES:  Brown eyes.  Pupils equal round and reactive to light and accomodation.  No conjunctivitis or scleral icterus. ENT:  Oropharynx clear without lesion.  Dentures.  Tongue normal. Mucous membranes moist.  RESPIRATORY:  Clear to auscultation without rales, wheezes or rhonchi. CARDIOVASCULAR:  Regular rate and rhythm without murmur, rub or gallop. ABDOMEN:  Soft, non-tender, with active bowel sounds, and no hepatosplenomegaly.  No masses. SKIN:  No rashes, ulcers or lesions. EXTREMITIES: No edema, no skin discoloration or tenderness.  No palpable cords. LYMPH NODES:  No palpable cervical, supraclavicular, axillary or inguinal adenopathy  NEUROLOGICAL: Unremarkable. PSYCH:  Appropriate.   Appointment on 11/02/2016  Component Date Value Ref Range Status  . WBC 11/02/2016 21.2* 3.8 - 10.6 K/uL Final  . RBC 11/02/2016 4.76  4.40 - 5.90 MIL/uL Final  . Hemoglobin 11/02/2016 11.3* 13.0 - 18.0 g/dL Final  . HCT 11/02/2016 35.6* 40.0 - 52.0 % Final  . MCV 11/02/2016 74.9* 80.0 - 100.0 fL Final  . MCH 11/02/2016 23.8* 26.0 - 34.0 pg Final  . MCHC 11/02/2016 31.8* 32.0 - 36.0 g/dL Final  . RDW 11/02/2016 15.7* 11.5 - 14.5 % Final  . Platelets 11/02/2016 205  150 - 440 K/uL Final  . Neutrophils  Relative % 11/02/2016 14  % Final  . Neutro Abs 11/02/2016 3.1  1.4 - 6.5 K/uL Final  . Lymphocytes Relative 11/02/2016 81  % Final  . Lymphs Abs 11/02/2016 17.1* 1.0 - 3.6 K/uL Final  . Monocytes Relative 11/02/2016 3  % Final  . Monocytes Absolute 11/02/2016 0.5  0.2 - 1.0 K/uL Final  . Eosinophils Relative 11/02/2016 2  % Final  . Eosinophils Absolute 11/02/2016 0.4  0 - 0.7 K/uL Final  . Basophils Relative 11/02/2016 0  % Final  . Basophils Absolute 11/02/2016 0.0  0 - 0.1 K/uL Final  . LDH 11/02/2016 114  98 - 192 U/L Final  . Sodium 11/02/2016 135  135 - 145 mmol/L Final  . Potassium 11/02/2016 3.9  3.5 - 5.1 mmol/L Final  . Chloride 11/02/2016 105  101 - 111 mmol/L Final  . CO2 11/02/2016 25  22 - 32 mmol/L Final  . Glucose, Bld 11/02/2016 120* 65 - 99 mg/dL Final  . BUN 11/02/2016 14  6 - 20 mg/dL Final  . Creatinine, Ser 11/02/2016 1.15  0.61 - 1.24 mg/dL Final  .  Calcium 11/02/2016 8.5* 8.9 - 10.3 mg/dL Final  . Total Protein 11/02/2016 7.1  6.5 - 8.1 g/dL Final  . Albumin 11/02/2016 3.8  3.5 - 5.0 g/dL Final  . AST 11/02/2016 19  15 - 41 U/L Final  . ALT 11/02/2016 12* 17 - 63 U/L Final  . Alkaline Phosphatase 11/02/2016 43  38 - 126 U/L Final  . Total Bilirubin 11/02/2016 0.4  0.3 - 1.2 mg/dL Final  . GFR calc non Af Amer 11/02/2016 >60  >60 mL/min Final  . GFR calc Af Amer 11/02/2016 >60  >60 mL/min Final   Comment: (NOTE) The eGFR has been calculated using the CKD EPI equation. This calculation has not been validated in all clinical situations. eGFR's persistently <60 mL/min signify possible Chronic Kidney Disease.   . Anion gap 11/02/2016 5  5 - 15 Final  . Retic Ct Pct 11/02/2016 1.4  0.4 - 3.1 % Final  . RBC. 11/02/2016 4.76  4.40 - 5.90 MIL/uL Final  . Retic Count, Manual 11/02/2016 66.6  19.0 - 183.0 K/uL Final    Assessment:  [name] is a 65 y.o. 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   He was treated with prednisone. With taper of prednisone, his hemolysis returned.  Chest and abdomen CT scan on 02/09/2014 revealed bulky bilateral axillary adenopathy and bulky intraperitoneal and retroperitoneal adenopathy. PET scan on 07/11/2014 revealed mildly hypermetabolic lymph nodes in the chest, abdomen, and pelvis and mildly hypermetabolic spleen.   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  He received 4 weekly cycles of Rituxan (10/02/2014 - 10/23/2014).  His hematocrit normalized.  He has slowly progressive microcytic anemia.  Colonoscopy on 07/23/2015 revealed diverticulosis in the sigmoid colon.  EGD on 08/20/2015 was normal.  Guaiac cards x 3 were negative in 06/2015.  His diet fluctuates with his appetite.  He denies any melana or hematochezia.  PET scan on 06/24/2016 revealed bilateral moderate hypermetabolic axillary lymphadenopathy increased in size (1.6 cm compared to 1.3 cm) and metabolism (SUV 3.9 compared to 3.6).  There were bilateral moderate hypermetabolic external iliac lymphadenopathy stable to decreased in metabolism.  There was mesenteric and retroperitoneal lymphadenopathy stable in size and non hypermetabolic.  There were no new sites of hypermetabolic lymphoma.  There were subcentimeter pulmonary nodules stable and below PET resolution.  Symptomatically, he denies any B symptoms.  Weight is down.  He has some urge incontinence.  Exam reveals no adenopathy.  Plan: 1.  Labs today:  CBC with diff, CMP, LDH, uric acid, ferritin, retic. 2.  FISH studies for CLL. 3.  UA and culture. 4.  Discuss signs and symptoms of CLL requiring treatment.  Symptoms include B symptoms, cytopenias, and threatened end-organ function. 5.  Discuss weight loss (? related to CLL).  Discuss Megace.  Information provided.  Discuss nutrition referral. 6.  Nutrition consult. 7.  Urology consult. 8.  RTC in 1 month for MD assessment +/-  labs.   Lequita Asal, MD  11/02/2016, 12:39 PM

## 2016-11-03 LAB — URINE CULTURE: Culture: NO GROWTH

## 2016-11-09 ENCOUNTER — Inpatient Hospital Stay: Payer: BLUE CROSS/BLUE SHIELD

## 2016-11-09 NOTE — Progress Notes (Signed)
Nutrition Assessment   Reason for Assessment: Referral from Dr. Mike Gip received at the request of patient.  Patient concerned about nutrition.  ASSESSMENT:  65 year old male admitted with B-cell small lymphocytic lymphoma.  Past medical history of anemia, HLD, PVD  Patient reports appetite has been up and down but recently (over the last 2-3 months) really trying to focus on good nutrition and eating well to prevent further weight loss. Reports diarrhea at times but last few bowel movements have been normal. Patient is concerned that he may not be eating the right foods.   Has been feeling weak and fatigued. Reports he has had to have several blood transfusions in the past.  Works 20:30pm-7:30am at [employer] and does a lot of physical labor.     Medications: reviewed  Labs: (11/13) Hgb 11.3, glucose 120  Anthropometrics:   Height: 68 inches Weight: 137 lb 12 oz UBW: 163 lb (last at that wt 2009) BMI: 21  Noted weight on 09/2015 144 lb 8.2 oz and current wt 137 lb.  5% weight loss in the last year, not significant.  Estimated Energy Needs  Kcals: 1800-2100 kcals/d Protein: 62-74 g/d Fluid: 2.1 L/d  NUTRITION DIAGNOSIS: Food and nutrition related knowledge deficit related to unintentional weight loss, diarrhea as evidenced by request for referral to Dietitian.   INTERVENTION:   Discussed diarrhea nutrition therapy and handout provided. Discussed foods with increased iron.   Discussed ways to increased calories and protein.   Encouraged boost/ensure/carnation instant breakfast supplements for added calories and protein. Teach back method used.  Expect good compliance.       MONITORING, EVALUATION, GOAL: Patient will continue to eat well balanced diet with increased calories and protein to promote weight gain and preserve lean body mass.   NEXT VISIT: as needed  Ardith Test B. Zenia Resides, [place], [place] (pager)

## 2016-11-10 LAB — FISH HES LEUKEMIA, 4Q12 REA

## 2016-11-20 ENCOUNTER — Ambulatory Visit: Payer: Self-pay | Admitting: Urology

## 2016-11-20 ENCOUNTER — Encounter: Payer: Self-pay | Admitting: Urology

## 2016-11-28 NOTE — Progress Notes (Deleted)
[facility] day:  11/28/16    Chief Complaint: [name] is a 65 y.o. male with lymphocytic lymphoma with associated hemolytic anemia who is seen for 1 month assessment.  HPI:  The patient was last seen in the medical onocology clinic on 11/02/2016.  At that time, he felt good.  He was eating better. Weigh was again down (approximately 10 pounds in the past year).  He noted at times a "catch across his abdomen" which felt like his abdomen is "locked up".  He denied any constipation. He had some urge incontinence.  Labs at last visit included a hematocrit of 35.6, hemoglobin 11.3, MCV 74.9, platelets 205,000, white count 21,200 with an Hollister of 3100. Absolute lymphocyte count was 17,100.  Ferritin was 275. Reticulocyte count was 1.4%.  Urinalysis was negative.  FISH studies on 11/02/2016 revealed 20% of nuclei positive for loss of 1 ATM signal, 63% of nuclei positive for trisomy 12, and 32% of nuclei positive for loss of 1 TP53 signal. Results for CCND1/IGH and 13q were normal.   He went met with [name], registered dietitian on 11/09/2016.  During the interim,    Past Medical History:  Diagnosis Date  . Anemia 2008  . Arthritis   . Chronic lymphocytic leukemia (CLL), B-cell ([place]) 06/15/2015  . Hyperlipidemia   . Peripheral vascular disease [facility])     Past Surgical History:  Procedure Laterality Date  . COLONOSCOPY    . COLONOSCOPY WITH PROPOFOL N/A 07/23/2015   Procedure: COLONOSCOPY WITH PROPOFOL;  Surgeon: [name], MD;  Location: [facility] ENDOSCOPY;  Service: Endoscopy;  Laterality: N/A;  . ESOPHAGOGASTRODUODENOSCOPY (EGD) WITH PROPOFOL N/A 08/20/2015   Procedure: ESOPHAGOGASTRODUODENOSCOPY (EGD) WITH PROPOFOL;  Surgeon: [name], MD;  Location: [facility] ENDOSCOPY;  Service: Endoscopy;  Laterality: N/A;  . lymp node removal Right 2011   arm  . LYMPH NODE BIOPSY  08/2014    No family history on file.  Social History:   reports that he has been smoking Cigars.  He has never used smokeless tobacco. He reports that he does not drink alcohol or use drugs.  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 The patient is alone today.  Allergies:  Allergies  Allergen Reactions  . Latex Rash    Current Medications: Current Outpatient Prescriptions  Medication Sig Dispense Refill  . cephALEXin (KEFLEX) 500 MG capsule   0  . ibuprofen (ADVIL,MOTRIN) 800 MG tablet   0  . tamsulosin (FLOMAX) 0.4 MG CAPS capsule Take 1 capsule (0.4 mg total) by mouth daily. 90 capsule 3   No current facility-administered medications for this visit.     Review of Systems:  GENERAL:  Feels "ok".  No fevers or sweats.  Weight down 2 pounds (up and down per patient). PERFORMANCE STATUS (ECOG):  1 HEENT:  No visual changes, runny nose, sore throat, mouth sores or tenderness. Lungs: No shortness of breath or cough.  No hemoptysis. Cardiac:  No chest pain, palpitations, orthopnea, or PND. GI: Eating well.  Catch across abdomen.  No nausea, vomiting, diarrhea, constipation, melena or hematochezia. GU:  Urge incontinence.  No frequency, dysuria, or hematuria.  Enlarged prostate. Musculoskeletal:  Back pain.  Arthritis.  No muscle tenderness. Extremities:  No pain or swelling. Skin:  No rashes or skin changes. Neuro:  No headache,  numbness or weakness, balance or coordination issues. Endocrine:  No diabetes, thyroid issues, hot flashes or night sweats. Psych:  No mood changes, depression or anxiety. Pain:  No focal pain. Review of systems:  All other systems reviewed and found to be negative.   Physical Exam: There were no vitals taken for this visit. GENERAL:  Thin gentleman sitting comfortably in the exam room in no acute distress. MENTAL STATUS:  Alert and oriented to person, place and time. HEAD:  Wearing a yellow cap.   Alopecia.  Graying goatee.  Normocephalic, atraumatic, face symmetric, no Cushingoid features. EYES:  Brown eyes.  Pupils equal round and reactive to light and accomodation.  No conjunctivitis or scleral icterus. ENT:  Oropharynx clear without lesion.  Dentures.  Tongue normal. Mucous membranes moist.  RESPIRATORY:  Clear to auscultation without rales, wheezes or rhonchi. CARDIOVASCULAR:  Regular rate and rhythm without murmur, rub or gallop. ABDOMEN:  Soft, non-tender, with active bowel sounds, and no hepatosplenomegaly.  No masses. SKIN:  No rashes, ulcers or lesions. EXTREMITIES: No edema, no skin discoloration or tenderness.  No palpable cords. LYMPH NODES:  No palpable cervical, supraclavicular, axillary or inguinal adenopathy  NEUROLOGICAL: Unremarkable. PSYCH:  Appropriate.   No visits with results within 3 Day(s) from this visit.  Latest known visit with results is:  Appointment on 11/02/2016  Component Date Value Ref Range Status  . WBC 11/02/2016 21.2* 3.8 - 10.6 K/uL Final  . RBC 11/02/2016 4.76  4.40 - 5.90 MIL/uL Final  . Hemoglobin 11/02/2016 11.3* 13.0 - 18.0 g/dL Final  . HCT 11/02/2016 35.6* 40.0 - 52.0 % Final  . MCV 11/02/2016 74.9* 80.0 - 100.0 fL Final  . MCH 11/02/2016 23.8* 26.0 - 34.0 pg Final  . MCHC 11/02/2016 31.8* 32.0 - 36.0 g/dL Final  . RDW 11/02/2016 15.7* 11.5 - 14.5 % Final  . Platelets 11/02/2016 205  150 - 440 K/uL Final  . Neutrophils Relative % 11/02/2016 14  % Final  . Neutro Abs 11/02/2016 3.1  1.4 - 6.5 K/uL Final  . Lymphocytes Relative 11/02/2016 81  % Final  . Lymphs Abs 11/02/2016 17.1* 1.0 - 3.6 K/uL Final  . Monocytes Relative 11/02/2016 3  % Final  . Monocytes Absolute 11/02/2016 0.5  0.2 - 1.0 K/uL Final  . Eosinophils Relative 11/02/2016 2  % Final  . Eosinophils Absolute 11/02/2016 0.4  0 - 0.7 K/uL Final  . Basophils Relative 11/02/2016 0  % Final  . Basophils Absolute 11/02/2016 0.0  0 - 0.1 K/uL Final  . LDH 11/02/2016 114  98  - 192 U/L Final  . Uric Acid, Serum 11/02/2016 5.4  4.4 - 7.6 mg/dL Final  . Ferritin 11/02/2016 275  24 - 336 ng/mL Final  . Sodium 11/02/2016 135  135 - 145 mmol/L Final  . Potassium 11/02/2016 3.9  3.5 - 5.1 mmol/L Final  . Chloride 11/02/2016 105  101 - 111 mmol/L Final  . CO2 11/02/2016 25  22 - 32 mmol/L Final  . Glucose, Bld 11/02/2016 120* 65 - 99 mg/dL Final  . BUN 11/02/2016 14  6 - 20 mg/dL Final  . Creatinine, Ser 11/02/2016 1.15  0.61 - 1.24 mg/dL Final  . Calcium 11/02/2016 8.5* 8.9 - 10.3 mg/dL Final  . Total Protein 11/02/2016 7.1  6.5 - 8.1 g/dL Final  . Albumin 11/02/2016 3.8  3.5 - 5.0 g/dL Final  . AST 11/02/2016 19  15 - 41 U/L Final  . ALT 11/02/2016 12* 17 - 63  U/L Final  . Alkaline Phosphatase 11/02/2016 43  38 - 126 U/L Final  . Total Bilirubin 11/02/2016 0.4  0.3 - 1.2 mg/dL Final  . GFR calc non Af Amer 11/02/2016 >60  >60 mL/min Final  . GFR calc Af Amer 11/02/2016 >60  >60 mL/min Final   Comment: (NOTE) The eGFR has been calculated using the CKD EPI equation. This calculation has not been validated in all clinical situations. eGFR's persistently <60 mL/min signify possible Chronic Kidney Disease.   . Anion gap 11/02/2016 5  5 - 15 Final  . Retic Ct Pct 11/02/2016 1.4  0.4 - 3.1 % Final  . RBC. 11/02/2016 4.76  4.40 - 5.90 MIL/uL Final  . Retic Count, Manual 11/02/2016 66.6  19.0 - 183.0 K/uL Final  . Specimen Type 11/10/2016 Comment:   Final  . Cells Counted: 11/10/2016 Comment:   Final  . Cells Analyzed 11/10/2016 Comment:   Final  . FISH Result 11/10/2016 Comment:   Final   Comment: (NOTE) 20% OF NUCLEI POSITIVE FOR LOSS OF ONE ATM SIGNAL; 63% OF NUCLEI POSITIVE FOR TRISOMY 12;. 32% OF NUCLEI POSITIVE FOR LOSS OF ONE TP53 SIGNAL .   [name] Interpretation: 11/10/2016 Comment:   Final   Comment: (NOTE) CLL RELATED CLONE DETECTED    The CLL interphase fluorescence in situ hybridization (FISH) panel analysis was positive for loss of one ATM signal,  three chromosome 12 centromere signals consistent with trisomy 12, and loss of one TP53 signal. Results for CCND1/IGH and 13q were normal.      Loss of the ATM and TP53 signals are associated with the most adverse prognoses in patients diagnosed with CLL. Trisomy 12 is a common finding in CLL.    SPECIFIC FISH RESULTS:  ATM: ABNORMAL    .      nuc ish 11q22.3(ATMx1),[20/100]    TP53: ABNORMAL      nuc ish 17p13.1(TP53x1)[32/100]   .  12cen: ABNORMAL    .      nuc ish 12cen(D12Z3x3)[63/100]  .  13q: NORMAL      .      nuc ish 13q14.3(DLEUx2),13q34(TFDP1x2)[100]   .  CCND1/IGH: NORMAL  .      nuc ish 11q13(CCND1x2),14q32(IGHx2)[100]        This analysis is limited to abnormalities detectable by the specific probes included in the study. FISH results should be interpreted within the context of a full cytogenetic a                          nalysis and hematologic evaluation.    REFERENCES:.  Malek,(2013) Adv Exp Med Biol [phone].VOPF#[id number] .  Schnaiter et al.,(2011) Clin Lab Med [phone].AFBX#[id number]         This test was developed and its performace characteristics determined by [facility]). It has not been cleared or approved by the U.S. Food and Drug Administration. The DNA probe vendor for this study was [employer] (physical sciences)).   . Color, Urine 11/02/2016 YELLOW* YELLOW Final  . APPearance 11/02/2016 CLEAR* CLEAR Final  . Glucose, UA 11/02/2016 NEGATIVE  NEGATIVE mg/dL Final  . Bilirubin Urine 11/02/2016 NEGATIVE  NEGATIVE Final  . Ketones, ur 11/02/2016 NEGATIVE  NEGATIVE mg/dL Final  . Specific Gravity, Urine 11/02/2016 1.020  1.005 - 1.030 Final  . Hgb urine dipstick 11/02/2016 1+* NEGATIVE Final  . pH 11/02/2016 6.0  5.0 - 8.0 Final  . Protein, ur 11/02/2016 NEGATIVE  NEGATIVE mg/dL Final  .  Nitrite 11/02/2016 NEGATIVE  NEGATIVE Final  . Leukocytes, UA 11/02/2016 NEGATIVE  NEGATIVE Final  . RBC / HPF 11/02/2016 0-5   0 - 5 RBC/hpf Final  . WBC, UA 11/02/2016 0-5  0 - 5 WBC/hpf Final  . Bacteria, UA 11/02/2016 NONE SEEN  NONE SEEN Final  . Squamous Epithelial / LPF 11/02/2016 NONE SEEN  NONE SEEN Final  . Mucous 11/02/2016 PRESENT   Final  . Specimen Description 11/03/2016 URINE, CLEAN CATCH   Final  . Special Requests 11/03/2016 NONE   Final  . Culture 11/03/2016    Final                   Value:NO GROWTH Performed at [facility]   . Report Status 11/03/2016 11/03/2016 FINAL   Final    Assessment:  Jakory Matsuo is a 65 y.o. 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  He was treated with prednisone. With taper of prednisone, his hemolysis returned.  Chest and abdomen CT scan on 02/09/2014 revealed bulky bilateral axillary adenopathy and bulky intraperitoneal and retroperitoneal adenopathy. PET scan on 07/11/2014 revealed mildly hypermetabolic lymph nodes in the chest, abdomen, and pelvis and mildly hypermetabolic spleen.   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  He received 4 weekly cycles of Rituxan (10/02/2014 - 10/23/2014).  His hematocrit normalized.  He has slowly progressive microcytic anemia.  Colonoscopy on 07/23/2015 revealed diverticulosis in the sigmoid colon.  EGD on 08/20/2015 was normal.  Guaiac cards x 3 were negative in 06/2015.  His diet fluctuates with his appetite.  He denies any melana or hematochezia.  PET scan on 06/24/2016 revealed bilateral moderate hypermetabolic axillary lymphadenopathy increased in size (1.6 cm compared to 1.3 cm) and metabolism (SUV 3.9 compared to 3.6).  There were bilateral moderate hypermetabolic external iliac lymphadenopathy stable to decreased in metabolism.  There was mesenteric and retroperitoneal lymphadenopathy stable in size and non hypermetabolic.  There were no new sites of hypermetabolic lymphoma.  There were subcentimeter  pulmonary nodules stable and below PET resolution.  FISH studies on 11/02/2016 revealed 20% of nuclei positive for loss of 1 ATM signal, 63% of nuclei positive for trisomy 12, and 32% of nuclei positive for loss of 1 TP53 signal. Results for CCND1/IGH and 13q were normal.   Symptomatically, he denies any B symptoms.  Weight is down.  He has some urge incontinence.  Exam reveals no adenopathy.  Plan: 1.  Labs today:  CBC with diff, CMP, LDH, uric acid, ferritin, retic. 2.  Discuss results of FISH studies for CLL. High risk features TP53.  4.  Discuss signs and symptoms of CLL requiring treatment.  Symptoms include B symptoms, cytopenias, and threatened end-organ function. 5.  Discuss weight loss (? related to CLL).  Discuss Megace.  Information provided.  Discuss nutrition referral. 6.  Nutrition consult. 7.  Urology consult. 8.  RTC in 1 month for MD assessment +/- labs.   Lequita Asal, MD  11/28/2016, 4:35 PM

## 2016-11-30 ENCOUNTER — Inpatient Hospital Stay: Payer: BLUE CROSS/BLUE SHIELD | Admitting: Hematology and Oncology

## 2016-11-30 ENCOUNTER — Inpatient Hospital Stay: Payer: BLUE CROSS/BLUE SHIELD

## 2016-12-25 ENCOUNTER — Inpatient Hospital Stay: Payer: BLUE CROSS/BLUE SHIELD | Admitting: *Deleted

## 2016-12-25 ENCOUNTER — Inpatient Hospital Stay: Payer: BLUE CROSS/BLUE SHIELD | Attending: Hematology and Oncology | Admitting: Hematology and Oncology

## 2016-12-25 VITALS — BP 126/71 | HR 54 | Temp 98.2°F | Wt 137.3 lb

## 2016-12-25 DIAGNOSIS — M129 Arthropathy, unspecified: Secondary | ICD-10-CM | POA: Diagnosis not present

## 2016-12-25 DIAGNOSIS — E785 Hyperlipidemia, unspecified: Secondary | ICD-10-CM | POA: Diagnosis not present

## 2016-12-25 DIAGNOSIS — K30 Functional dyspepsia: Secondary | ICD-10-CM

## 2016-12-25 DIAGNOSIS — N3941 Urge incontinence: Secondary | ICD-10-CM | POA: Diagnosis not present

## 2016-12-25 DIAGNOSIS — C9110 Chronic lymphocytic leukemia of B-cell type not having achieved remission: Secondary | ICD-10-CM

## 2016-12-25 DIAGNOSIS — R140 Abdominal distension (gaseous): Secondary | ICD-10-CM | POA: Insufficient documentation

## 2016-12-25 DIAGNOSIS — F17210 Nicotine dependence, cigarettes, uncomplicated: Secondary | ICD-10-CM | POA: Diagnosis not present

## 2016-12-25 DIAGNOSIS — D589 Hereditary hemolytic anemia, unspecified: Secondary | ICD-10-CM | POA: Insufficient documentation

## 2016-12-25 DIAGNOSIS — R1013 Epigastric pain: Secondary | ICD-10-CM

## 2016-12-25 DIAGNOSIS — R634 Abnormal weight loss: Secondary | ICD-10-CM

## 2016-12-25 DIAGNOSIS — K219 Gastro-esophageal reflux disease without esophagitis: Secondary | ICD-10-CM | POA: Insufficient documentation

## 2016-12-25 DIAGNOSIS — I739 Peripheral vascular disease, unspecified: Secondary | ICD-10-CM

## 2016-12-25 LAB — CBC WITH DIFFERENTIAL/PLATELET
Basophils Absolute: 0.1 10*3/uL (ref 0–0.1)
Basophils Relative: 0 %
Eosinophils Absolute: 0.3 10*3/uL (ref 0–0.7)
Eosinophils Relative: 1 %
HCT: 37.1 % — ABNORMAL LOW (ref 40.0–52.0)
Hemoglobin: 11.7 g/dL — ABNORMAL LOW (ref 13.0–18.0)
Lymphocytes Relative: 80 %
Lymphs Abs: 18.7 10*3/uL — ABNORMAL HIGH (ref 1.0–3.6)
MCH: 23.6 pg — ABNORMAL LOW (ref 26.0–34.0)
MCHC: 31.7 g/dL — ABNORMAL LOW (ref 32.0–36.0)
MCV: 74.6 fL — ABNORMAL LOW (ref 80.0–100.0)
Monocytes Absolute: 0.7 10*3/uL (ref 0.2–1.0)
Monocytes Relative: 3 %
Neutro Abs: 3.7 10*3/uL (ref 1.4–6.5)
Neutrophils Relative %: 16 %
Platelets: 218 10*3/uL (ref 150–440)
RBC: 4.97 MIL/uL (ref 4.40–5.90)
RDW: 15.7 % — ABNORMAL HIGH (ref 11.5–14.5)
WBC: 23.4 10*3/uL — ABNORMAL HIGH (ref 3.8–10.6)

## 2016-12-25 LAB — COMPREHENSIVE METABOLIC PANEL
ALT: 13 U/L — ABNORMAL LOW (ref 17–63)
AST: 22 U/L (ref 15–41)
Albumin: 4.1 g/dL (ref 3.5–5.0)
Alkaline Phosphatase: 48 U/L (ref 38–126)
Anion gap: 5 (ref 5–15)
BUN: 13 mg/dL (ref 6–20)
CO2: 25 mmol/L (ref 22–32)
Calcium: 9.2 mg/dL (ref 8.9–10.3)
Chloride: 106 mmol/L (ref 101–111)
Creatinine, Ser: 1.13 mg/dL (ref 0.61–1.24)
GFR calc Af Amer: >60 mL/min (ref >60)
GFR calc non Af Amer: >60 mL/min (ref >60)
Glucose, Bld: 95 mg/dL (ref 65–99)
Potassium: 4.1 mmol/L (ref 3.5–5.1)
Sodium: 136 mmol/L (ref 135–145)
Total Bilirubin: 0.6 mg/dL (ref 0.3–1.2)
Total Protein: 7.9 g/dL (ref 6.5–8.1)

## 2016-12-25 LAB — LIPASE, BLOOD: Lipase: 43 U/L (ref 11–51)

## 2016-12-25 LAB — SEDIMENTATION RATE: Sed Rate: 27 mm/hr — ABNORMAL HIGH (ref 0–20)

## 2016-12-25 LAB — FERRITIN: Ferritin: 244 ng/mL (ref 24–336)

## 2016-12-25 LAB — AMYLASE: Amylase: 131 U/L — ABNORMAL HIGH (ref 28–100)

## 2016-12-25 MED ORDER — OMEPRAZOLE 20 MG PO CPDR: 20.0000 mg | DELAYED_RELEASE_CAPSULE | Freq: Every day | ORAL | 2 refills | Status: DC

## 2016-12-25 NOTE — Progress Notes (Signed)
Patient here for follow up. Patient has been experiencing episodes of severe epigastric for the past year.

## 2016-12-25 NOTE — Progress Notes (Signed)
[facility] day:  12/25/16    Chief Complaint: [name] is a 66 y.o. 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  HPI:  The patient was last seen in the medical onocology clinic on 11/02/2016.  At that time, he felt good.  He was eating better. Weigh was again down (approximately 10 pounds in the past year).  He noted at times a "catch across his abdomen" which felt like his abdomen is "locked up".  He denied any constipation. He had some urge incontinence.  Labs at last visit included a hematocrit of 35.6, hemoglobin 11.3, MCV 74.9, platelets 205,000, white count 21,200 with an [place] of 3100. Absolute lymphocyte count was 17,100.  Ferritin was 275. Reticulocyte count was 1.4%.  Urinalysis was negative.  FISH studies on 11/02/2016 revealed 20% of nuclei positive for loss of 1 ATM signal, 63% of nuclei positive for trisomy 12, and 32% of nuclei positive for loss of 1 TP53 signal. Results for CCND1/IGH and 13q were normal.   He met with [name], registered dietitian on 11/09/2016.  During the interim, he has felt pretty good.  He stats that if he eats, he has to lay down secondary to pressure pain.  He will then have 2 small bowel movements.  After bowel movements, he feels good.  He notes gas and indigestion.  He has reflux.  Some days, he has no symptoms.  He denies any fevers, sweats or weight loss.   Past Medical History:  Diagnosis Date  . Anemia 2008  . Arthritis   . Chronic lymphocytic leukemia (CLL), B-cell ([place]) 06/15/2015  . Hyperlipidemia   . Peripheral vascular disease [facility])     Past Surgical History:  Procedure Laterality Date  . COLONOSCOPY    . COLONOSCOPY WITH PROPOFOL N/A 07/23/2015   Procedure: COLONOSCOPY WITH PROPOFOL;  Surgeon: [name], MD;  Location: [facility] ENDOSCOPY;  Service: Endoscopy;  Laterality: N/A;  . ESOPHAGOGASTRODUODENOSCOPY (EGD) WITH  PROPOFOL N/A 08/20/2015   Procedure: ESOPHAGOGASTRODUODENOSCOPY (EGD) WITH PROPOFOL;  Surgeon: [name], MD;  Location: [facility] ENDOSCOPY;  Service: Endoscopy;  Laterality: N/A;  . lymp node removal Right 2011   arm  . LYMPH NODE BIOPSY  08/2014    No family history on file.  Social History:  reports that he has been smoking Cigars.  He has never used smokeless tobacco. He reports that he does not drink alcohol or use drugs.  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 The patient is alone today.  Allergies:  Allergies  Allergen Reactions  . Latex Rash    Current Medications: Current Outpatient Prescriptions  Medication Sig Dispense Refill  . ibuprofen (ADVIL,MOTRIN) 800 MG tablet   0  . tamsulosin (FLOMAX) 0.4 MG CAPS capsule Take 1 capsule (0.4 mg total) by mouth daily. 90 capsule 3   No current facility-administered medications for this visit.     Review of Systems:  GENERAL:  Feels "pretty good".  No fevers or sweats.  Weight stable (up and down per patient). PERFORMANCE STATUS (ECOG):  1 HEENT:  No visual changes, runny nose, sore throat, mouth sores or tenderness. Lungs: No shortness of breath or cough.  No hemoptysis. Cardiac:  No chest pain, palpitations, orthopnea, or PND. GI: Eating well.  Reflux.  Gas.  Pressure after eating relieved by bowel movements.  No nausea, vomiting, diarrhea, constipation, melena or hematochezia. GU:  Urge incontinence.  No frequency, dysuria, or hematuria.  Enlarged prostate. Musculoskeletal:  No back pain.  Arthritis.  No muscle tenderness. Extremities:  No pain or swelling. Skin:  Peri-rectal pruritus.  No rashes or skin changes. Neuro:  No headache, numbness or weakness, balance or coordination issues. Endocrine:  No diabetes, thyroid issues, hot flashes or night sweats. Psych:  No mood changes, depression or  anxiety. Pain:  No focal pain. Review of systems:  All other systems reviewed and found to be negative.   Physical Exam: Blood pressure 126/71, pulse (!) 54, temperature 98.2 F (36.8 C), temperature source Oral, weight 137 lb 5.6 oz (62.3 kg). GENERAL:  Thin gentleman sitting comfortably in the exam room in no acute distress. MENTAL STATUS:  Alert and oriented to person, place and time. HEAD:   Alopecia.  Graying goatee.  Normocephalic, atraumatic, face symmetric, no Cushingoid features. EYES:  Brown eyes.  Pupils equal round and reactive to light and accomodation.  No conjunctivitis or scleral icterus. ENT:  Oropharynx clear without lesion.  Dentures.  Tongue normal. Mucous membranes moist.  RESPIRATORY:  Clear to auscultation without rales, wheezes or rhonchi. CARDIOVASCULAR:  Regular rate and rhythm without murmur, rub or gallop. ABDOMEN:  Soft, non-tender, with active bowel sounds, and no hepatosplenomegaly.  No masses. SKIN:  No rashes, ulcers or lesions. EXTREMITIES: No edema, no skin discoloration or tenderness.  No palpable cords. LYMPH NODES:  No palpable cervical, supraclavicular, axillary or inguinal adenopathy  NEUROLOGICAL: Unremarkable. PSYCH:  Appropriate.   No visits with results within 3 Day(s) from this visit.  Latest known visit with results is:  Appointment on 11/02/2016  Component Date Value Ref Range Status  . WBC 11/02/2016 21.2* 3.8 - 10.6 K/uL Final  . RBC 11/02/2016 4.76  4.40 - 5.90 MIL/uL Final  . Hemoglobin 11/02/2016 11.3* 13.0 - 18.0 g/dL Final  . HCT 11/02/2016 35.6* 40.0 - 52.0 % Final  . MCV 11/02/2016 74.9* 80.0 - 100.0 fL Final  . MCH 11/02/2016 23.8* 26.0 - 34.0 pg Final  . MCHC 11/02/2016 31.8* 32.0 - 36.0 g/dL Final  . RDW 11/02/2016 15.7* 11.5 - 14.5 % Final  . Platelets 11/02/2016 205  150 - 440 K/uL Final  . Neutrophils Relative % 11/02/2016 14  % Final  . Neutro Abs 11/02/2016 3.1  1.4 - 6.5 K/uL Final  . Lymphocytes Relative 11/02/2016  81  % Final  . Lymphs Abs 11/02/2016 17.1* 1.0 - 3.6 K/uL Final  . Monocytes Relative 11/02/2016 3  % Final  . Monocytes Absolute 11/02/2016 0.5  0.2 - 1.0 K/uL Final  . Eosinophils Relative 11/02/2016 2  % Final  . Eosinophils Absolute 11/02/2016 0.4  0 - 0.7 K/uL Final  . Basophils Relative 11/02/2016 0  % Final  . Basophils Absolute 11/02/2016 0.0  0 - 0.1 K/uL Final  . LDH 11/02/2016 114  98 - 192 U/L Final  . Uric Acid, Serum 11/02/2016 5.4  4.4 - 7.6 mg/dL Final  . Ferritin 11/02/2016 275  24 - 336 ng/mL Final  . Sodium 11/02/2016 135  135 - 145 mmol/L Final  . Potassium 11/02/2016 3.9  3.5 - 5.1 mmol/L Final  . Chloride 11/02/2016 105  101 - 111 mmol/L Final  . CO2 11/02/2016 25  22 - 32 mmol/L Final  . Glucose, Bld 11/02/2016 120* 65 - 99 mg/dL Final  . BUN 11/02/2016  14  6 - 20 mg/dL Final  . Creatinine, Ser 11/02/2016 1.15  0.61 - 1.24 mg/dL Final  . Calcium 11/02/2016 8.5* 8.9 - 10.3 mg/dL Final  . Total Protein 11/02/2016 7.1  6.5 - 8.1 g/dL Final  . Albumin 11/02/2016 3.8  3.5 - 5.0 g/dL Final  . AST 11/02/2016 19  15 - 41 U/L Final  . ALT 11/02/2016 12* 17 - 63 U/L Final  . Alkaline Phosphatase 11/02/2016 43  38 - 126 U/L Final  . Total Bilirubin 11/02/2016 0.4  0.3 - 1.2 mg/dL Final  . GFR calc non Af Amer 11/02/2016 >60  >60 mL/min Final  . GFR calc Af Amer 11/02/2016 >60  >60 mL/min Final   Comment: (NOTE) The eGFR has been calculated using the CKD EPI equation. This calculation has not been validated in all clinical situations. eGFR's persistently <60 mL/min signify possible Chronic Kidney Disease.   . Anion gap 11/02/2016 5  5 - 15 Final  . Retic Ct Pct 11/02/2016 1.4  0.4 - 3.1 % Final  . RBC. 11/02/2016 4.76  4.40 - 5.90 MIL/uL Final  . Retic Count, Manual 11/02/2016 66.6  19.0 - 183.0 K/uL Final  . Specimen Type 11/10/2016 Comment:   Final  . Cells Counted: 11/10/2016 Comment:   Final  . Cells Analyzed 11/10/2016 Comment:   Final  . FISH Result 11/10/2016  Comment:   Final   Comment: (NOTE) 20% OF NUCLEI POSITIVE FOR LOSS OF ONE ATM SIGNAL; 63% OF NUCLEI POSITIVE FOR TRISOMY 12;. 32% OF NUCLEI POSITIVE FOR LOSS OF ONE TP53 SIGNAL .   [name] Interpretation: 11/10/2016 Comment:   Final   Comment: (NOTE) CLL RELATED CLONE DETECTED    The CLL interphase fluorescence in situ hybridization (FISH) panel analysis was positive for loss of one ATM signal, three chromosome 12 centromere signals consistent with trisomy 12, and loss of one TP53 signal. Results for CCND1/IGH and 13q were normal.      Loss of the ATM and TP53 signals are associated with the most adverse prognoses in patients diagnosed with CLL. Trisomy 12 is a common finding in CLL.    SPECIFIC FISH RESULTS:  ATM: ABNORMAL    .      nuc ish 11q22.3(ATMx1),[20/100]    TP53: ABNORMAL      nuc ish 17p13.1(TP53x1)[32/100]   .  12cen: ABNORMAL    .      nuc ish 12cen(D12Z3x3)[63/100]  .  13q: NORMAL      .      nuc ish 13q14.3(DLEUx2),13q34(TFDP1x2)[100]   .  CCND1/IGH: NORMAL  .      nuc ish 11q13(CCND1x2),14q32(IGHx2)[100]        This analysis is limited to abnormalities detectable by the specific probes included in the study. FISH results should be interpreted within the context of a full cytogenetic a                          nalysis and hematologic evaluation.    REFERENCES:.  Malek,(2013) Adv Exp Med Biol [phone].[id number] .  Schnaiter et al.,(2011) Clin Lab Med [phone].[id number]         This test was developed and its performace characteristics determined by [facility]). It has not been cleared or approved by the U.S. Food and Drug Administration. The DNA probe vendor for this study was [employer] (physical sciences)).   . Color, Urine 11/02/2016 YELLOW* YELLOW Final  . APPearance 11/02/2016 CLEAR* CLEAR  Final  . Glucose, UA 11/02/2016 NEGATIVE  NEGATIVE mg/dL Final  . Bilirubin Urine 11/02/2016 NEGATIVE  NEGATIVE Final   . Ketones, ur 11/02/2016 NEGATIVE  NEGATIVE mg/dL Final  . Specific Gravity, Urine 11/02/2016 1.020  1.005 - 1.030 Final  . Hgb urine dipstick 11/02/2016 1+* NEGATIVE Final  . pH 11/02/2016 6.0  5.0 - 8.0 Final  . Protein, ur 11/02/2016 NEGATIVE  NEGATIVE mg/dL Final  . Nitrite 11/02/2016 NEGATIVE  NEGATIVE Final  . Leukocytes, UA 11/02/2016 NEGATIVE  NEGATIVE Final  . RBC / HPF 11/02/2016 0-5  0 - 5 RBC/hpf Final  . WBC, UA 11/02/2016 0-5  0 - 5 WBC/hpf Final  . Bacteria, UA 11/02/2016 NONE SEEN  NONE SEEN Final  . Squamous Epithelial / LPF 11/02/2016 NONE SEEN  NONE SEEN Final  . Mucous 11/02/2016 PRESENT   Final  . Specimen Description 11/03/2016 URINE, CLEAN CATCH   Final  . Special Requests 11/03/2016 NONE   Final  . Culture 11/03/2016    Final                   Value:NO GROWTH Performed at [facility]   . Report Status 11/03/2016 11/03/2016 FINAL   Final    Assessment:  [name] is a 66 y.o. 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  He was treated with prednisone. With taper of prednisone, his hemolysis returned.  Chest and abdomen CT scan on 02/09/2014 revealed bulky bilateral axillary adenopathy and bulky intraperitoneal and retroperitoneal adenopathy. PET scan on 07/11/2014 revealed mildly hypermetabolic lymph nodes in the chest, abdomen, and pelvis and mildly hypermetabolic spleen.   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  He received 4 weekly cycles of Rituxan (10/02/2014 - 10/23/2014).  His hematocrit normalized.  He has slowly progressive microcytic anemia.  Colonoscopy on 07/23/2015 revealed diverticulosis in the sigmoid colon.  EGD on 08/20/2015 was normal.  Guaiac cards x 3 were negative in 06/2015.  His diet fluctuates with his appetite.  He denies any melana or hematochezia.  PET scan on 06/24/2016 revealed bilateral moderate  hypermetabolic axillary lymphadenopathy increased in size (1.6 cm compared to 1.3 cm) and metabolism (SUV 3.9 compared to 3.6).  There were bilateral moderate hypermetabolic external iliac lymphadenopathy stable to decreased in metabolism.  There was mesenteric and retroperitoneal lymphadenopathy stable in size and non hypermetabolic.  There were no new sites of hypermetabolic lymphoma.  There were subcentimeter pulmonary nodules stable and below PET resolution.  FISH studies on 11/02/2016 revealed 20% of nuclei positive for loss of 1 ATM signal, 63% of nuclei positive for trisomy 12, and 32% of nuclei positive for loss of 1 TP53 signal. Results for CCND1/IGH and 13q were normal.   Symptomatically, he has upper abdominal pressure after eating relieved by bowel movements.  He has reflux and gas.  He denies any B symptoms.  Weight is stable.  Exam reveals no adenopathy.  Plan: 1.  Labs today:  CBC with diff, CMP, ferritin, ESR, amylase, lipase. 2.  Trial of omeprazole and Mylicon. 3.  Discuss scans is pain persists. 4.  RTC in 2-3 weeks for MD reassessment.   Lequita Asal, MD  12/25/2016, 12:12 PM

## 2016-12-27 ENCOUNTER — Encounter: Payer: Self-pay | Admitting: Hematology and Oncology

## 2016-12-31 ENCOUNTER — Ambulatory Visit: Payer: Self-pay | Admitting: Urology

## 2016-12-31 ENCOUNTER — Encounter: Payer: Self-pay | Admitting: Urology

## 2017-01-04 ENCOUNTER — Telehealth: Payer: Self-pay

## 2017-01-04 NOTE — Telephone Encounter (Signed)
Nutrition Follow-up:  Phone call to patient this pm for nutrition follow-up. Unable to reach patient or leave message on phone listed in system.  Chart reviewed Noted patient weight on 1/5 stable at 137 pounds, same as on visit 11/13. Noted per MD note when patient eats has pressure and pain, then has 2 small bowel movements they feels better.  Patient reported gas, indigestion and reflux on MD visit on 1/5.    Noted prilosec prescribed and mylicon.     NEXT VISIT: Place new referral for RD if needed   Zahra Peffley B. Zenia Resides, [place], [place] (pager)

## 2017-01-07 NOTE — Progress Notes (Deleted)
[facility] day:  01/07/17    Chief Complaint: [name] is a 66 y.o. 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  HPI:  The patient was last seen in the medical onocology clinic on 12/25/2016.  At that time, he he felt pretty good unless he ate.  He described a pressure like pain after he eats.  He felt good after laying down and having 2 bowel movements.  Pain was intermittent.  He notes gas and reflux.  He denied any B symptoms.    Labs included a hematocrit of 37.1, hemoglobin 11.7, MCV 74.6, platelets 218,000, WBC 23,400 with an [place] of 3700.  ALC was 18,700.  CMP was normal.  Ferritin was 244.  ESR was 27 (0-20).  Amylase was 131 (28-100) with a lipase of 43 (11-51).  We discussed a trial of Mylicon and omeprazole.  Past Medical History:  Diagnosis Date  . Anemia 2008  . Arthritis   . Chronic lymphocytic leukemia (CLL), B-cell ([place]) 06/15/2015  . Hyperlipidemia   . Peripheral vascular disease [facility])     Past Surgical History:  Procedure Laterality Date  . COLONOSCOPY    . COLONOSCOPY WITH PROPOFOL N/A 07/23/2015   Procedure: COLONOSCOPY WITH PROPOFOL;  Surgeon: [name], MD;  Location: [facility] ENDOSCOPY;  Service: Endoscopy;  Laterality: N/A;  . ESOPHAGOGASTRODUODENOSCOPY (EGD) WITH PROPOFOL N/A 08/20/2015   Procedure: ESOPHAGOGASTRODUODENOSCOPY (EGD) WITH PROPOFOL;  Surgeon: [name], MD;  Location: [facility] ENDOSCOPY;  Service: Endoscopy;  Laterality: N/A;  . lymp node removal Right 2011   arm  . LYMPH NODE BIOPSY  08/2014    No family history on file.  Social History:  reports that he has been smoking Cigars.  He has never used smokeless tobacco. He reports that he does not drink alcohol or use drugs.  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  The patient is alone today.  Allergies:  Allergies  Allergen Reactions  . Latex Rash    Current Medications: Current Outpatient Prescriptions  Medication Sig Dispense Refill  . ibuprofen (ADVIL,MOTRIN) 800 MG tablet   0  . omeprazole (PRILOSEC) 20 MG capsule Take 1 capsule (20 mg total) by mouth daily. 30 capsule 2  . tamsulosin (FLOMAX) 0.4 MG CAPS capsule Take 1 capsule (0.4 mg total) by mouth daily. 90 capsule 3   No current facility-administered medications for this visit.     Review of Systems:  GENERAL:  Feels "pretty good".  No fevers or sweats.  Weight stable (up and down per patient). PERFORMANCE STATUS (ECOG):  1 HEENT:  No visual changes, runny nose, sore throat, mouth sores or tenderness. Lungs: No shortness of breath or cough.  No hemoptysis. Cardiac:  No chest pain, palpitations, orthopnea, or PND. GI: Eating well.  Reflux.  Gas.  Pressure after eating relieved by bowel movements.  No nausea, vomiting, diarrhea, constipation, melena or hematochezia. GU:  Urge incontinence.  No frequency, dysuria, or hematuria.  Enlarged prostate. Musculoskeletal:  No back pain.  Arthritis.  No muscle tenderness. Extremities:  No pain or swelling. Skin:  Peri-rectal pruritus.  No rashes or skin changes. Neuro:  No headache, numbness or weakness, balance or coordination issues. Endocrine:  No diabetes, thyroid issues, hot flashes or night sweats. Psych:  No  mood changes, depression or anxiety. Pain:  No focal pain. Review of systems:  All other systems reviewed and found to be negative.   Physical Exam: There were no vitals taken for this visit. GENERAL:  Thin gentleman sitting comfortably in the exam room in no acute distress. MENTAL STATUS:  Alert and oriented to person, place and time. HEAD:   Alopecia.  Graying goatee.  Normocephalic, atraumatic, face symmetric, no Cushingoid features. EYES:  Brown eyes.  Pupils equal round and reactive to light and accomodation.  No  conjunctivitis or scleral icterus. ENT:  Oropharynx clear without lesion.  Dentures.  Tongue normal. Mucous membranes moist.  RESPIRATORY:  Clear to auscultation without rales, wheezes or rhonchi. CARDIOVASCULAR:  Regular rate and rhythm without murmur, rub or gallop. ABDOMEN:  Soft, non-tender, with active bowel sounds, and no hepatosplenomegaly.  No masses. SKIN:  No rashes, ulcers or lesions. EXTREMITIES: No edema, no skin discoloration or tenderness.  No palpable cords. LYMPH NODES:  No palpable cervical, supraclavicular, axillary or inguinal adenopathy  NEUROLOGICAL: Unremarkable. PSYCH:  Appropriate.   No visits with results within 3 Day(s) from this visit.  Latest known visit with results is:  Clinical Support on 12/25/2016  Component Date Value Ref Range Status  . WBC 12/25/2016 23.4* 3.8 - 10.6 K/uL Final  . RBC 12/25/2016 4.97  4.40 - 5.90 MIL/uL Final  . Hemoglobin 12/25/2016 11.7* 13.0 - 18.0 g/dL Final  . HCT 12/25/2016 37.1* 40.0 - 52.0 % Final  . MCV 12/25/2016 74.6* 80.0 - 100.0 fL Final  . MCH 12/25/2016 23.6* 26.0 - 34.0 pg Final  . MCHC 12/25/2016 31.7* 32.0 - 36.0 g/dL Final  . RDW 12/25/2016 15.7* 11.5 - 14.5 % Final  . Platelets 12/25/2016 218  150 - 440 K/uL Final  . Neutrophils Relative % 12/25/2016 16  % Final  . Neutro Abs 12/25/2016 3.7  1.4 - 6.5 K/uL Final  . Lymphocytes Relative 12/25/2016 80  % Final  . Lymphs Abs 12/25/2016 18.7* 1.0 - 3.6 K/uL Final  . Monocytes Relative 12/25/2016 3  % Final  . Monocytes Absolute 12/25/2016 0.7  0.2 - 1.0 K/uL Final  . Eosinophils Relative 12/25/2016 1  % Final  . Eosinophils Absolute 12/25/2016 0.3  0 - 0.7 K/uL Final  . Basophils Relative 12/25/2016 0  % Final  . Basophils Absolute 12/25/2016 0.1  0 - 0.1 K/uL Final  . Sodium 12/25/2016 136  135 - 145 mmol/L Final  . Potassium 12/25/2016 4.1  3.5 - 5.1 mmol/L Final  . Chloride 12/25/2016 106  101 - 111 mmol/L Final  . CO2 12/25/2016 25  22 - 32 mmol/L Final   . Glucose, Bld 12/25/2016 95  65 - 99 mg/dL Final  . BUN 12/25/2016 13  6 - 20 mg/dL Final  . Creatinine, Ser 12/25/2016 1.13  0.61 - 1.24 mg/dL Final  . Calcium 12/25/2016 9.2  8.9 - 10.3 mg/dL Final  . Total Protein 12/25/2016 7.9  6.5 - 8.1 g/dL Final  . Albumin 12/25/2016 4.1  3.5 - 5.0 g/dL Final  . AST 12/25/2016 22  15 - 41 U/L Final  . ALT 12/25/2016 13* 17 - 63 U/L Final  . Alkaline Phosphatase 12/25/2016 48  38 - 126 U/L Final  . Total Bilirubin 12/25/2016 0.6  0.3 - 1.2 mg/dL Final  . GFR calc non Af Amer 12/25/2016 >60  >60 mL/min Final  . GFR calc Af Amer 12/25/2016 >60  >60 mL/min Final   Comment: (NOTE) The eGFR  has been calculated using the CKD EPI equation. This calculation has not been validated in all clinical situations. eGFR's persistently <60 mL/min signify possible Chronic Kidney Disease.   . Anion gap 12/25/2016 5  5 - 15 Final  . Ferritin 12/25/2016 244  24 - 336 ng/mL Final  . Sed Rate 12/25/2016 27* 0 - 20 mm/hr Final  . Amylase 12/25/2016 131* 28 - 100 U/L Final  . Lipase 12/25/2016 43  11 - 51 U/L Final    Assessment:  [name] is a 66 y.o. 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  He was treated with prednisone. With taper of prednisone, his hemolysis returned.  Chest and abdomen CT scan on 02/09/2014 revealed bulky bilateral axillary adenopathy and bulky intraperitoneal and retroperitoneal adenopathy. PET scan on 07/11/2014 revealed mildly hypermetabolic lymph nodes in the chest, abdomen, and pelvis and mildly hypermetabolic spleen.   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  He received 4 weekly cycles of Rituxan (10/02/2014 - 10/23/2014).  His hematocrit normalized.  He has slowly progressive microcytic anemia.  Colonoscopy on 07/23/2015 revealed diverticulosis in the sigmoid colon.  EGD on 08/20/2015 was normal.  Guaiac cards x  3 were negative in 06/2015.  His diet fluctuates with his appetite.  He denies any melana or hematochezia.  PET scan on 06/24/2016 revealed bilateral moderate hypermetabolic axillary lymphadenopathy increased in size (1.6 cm compared to 1.3 cm) and metabolism (SUV 3.9 compared to 3.6).  There were bilateral moderate hypermetabolic external iliac lymphadenopathy stable to decreased in metabolism.  There was mesenteric and retroperitoneal lymphadenopathy stable in size and non hypermetabolic.  There were no new sites of hypermetabolic lymphoma.  There were subcentimeter pulmonary nodules stable and below PET resolution.  FISH studies on 11/02/2016 revealed 20% of nuclei positive for loss of 1 ATM signal, 63% of nuclei positive for trisomy 12, and 32% of nuclei positive for loss of 1 TP53 signal. Results for CCND1/IGH and 13q were normal.   Symptomatically, he has upper abdominal pressure after eating relieved by bowel movements.  He has reflux and gas.  He denies any B symptoms.  Weight is stable.  Exam reveals no adenopathy.  Plan: 1.  Labs today:  CBC with diff, CMP, ferritin, ESR, amylase, lipase. 2.  Trial of omeprazole and Mylicon. 3.  Discuss scans is pain persists. 4.  RTC in 2-3 weeks for MD reassessment.   Lequita Asal, MD  01/07/2017, 4:44 PM

## 2017-01-08 ENCOUNTER — Inpatient Hospital Stay: Payer: BLUE CROSS/BLUE SHIELD | Admitting: Hematology and Oncology

## 2017-01-22 ENCOUNTER — Inpatient Hospital Stay: Payer: BLUE CROSS/BLUE SHIELD | Admitting: Hematology and Oncology

## 2017-01-22 NOTE — Progress Notes (Deleted)
[facility] day:  01/22/17    Chief Complaint: [name] is a 66 y.o. 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  HPI:  The patient was last seen in the medical onocology clinic on 12/25/2016.  At that time, he he felt pretty good unless he ate.  He described a pressure like pain after he eats.  He felt good after laying down and having 2 bowel movements.  Pain was intermittent.  He notes gas and reflux.  He denied any B symptoms.    Labs included a hematocrit of 37.1, hemoglobin 11.7, MCV 74.6, platelets 218,000, WBC 23,400 with an [place] of 3700.  ALC was 18,700.  CMP was normal.  Ferritin was 244.  ESR was 27 (0-20).  Amylase was 131 (28-100) with a lipase of 43 (11-51).  We discussed a trial of Mylicon and omeprazole.    Past Medical History:  Diagnosis Date  . Anemia 2008  . Arthritis   . Chronic lymphocytic leukemia (CLL), B-cell ([place]) 06/15/2015  . Hyperlipidemia   . Peripheral vascular disease [facility])     Past Surgical History:  Procedure Laterality Date  . COLONOSCOPY    . COLONOSCOPY WITH PROPOFOL N/A 07/23/2015   Procedure: COLONOSCOPY WITH PROPOFOL;  Surgeon: [name], MD;  Location: [facility] ENDOSCOPY;  Service: Endoscopy;  Laterality: N/A;  . ESOPHAGOGASTRODUODENOSCOPY (EGD) WITH PROPOFOL N/A 08/20/2015   Procedure: ESOPHAGOGASTRODUODENOSCOPY (EGD) WITH PROPOFOL;  Surgeon: [name], MD;  Location: [facility] ENDOSCOPY;  Service: Endoscopy;  Laterality: N/A;  . lymp node removal Right 2011   arm  . LYMPH NODE BIOPSY  08/2014    No family history on file.  Social History:  reports that he has been smoking Cigars.  He has never used smokeless tobacco. He reports that he does not drink alcohol or use drugs.  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  He works 11-12 hours a day (11:30 PM - 7:30 AM at [employer]). The patient is alone today.  Allergies:  Allergies  Allergen Reactions  . Latex Rash    Current Medications: Current Outpatient Prescriptions  Medication Sig Dispense Refill  . ibuprofen (ADVIL,MOTRIN) 800 MG tablet   0  . omeprazole (PRILOSEC) 20 MG capsule Take 1 capsule (20 mg total) by mouth daily. 30 capsule 2  . tamsulosin (FLOMAX) 0.4 MG CAPS capsule Take 1 capsule (0.4 mg total) by mouth daily. 90 capsule 3   No current facility-administered medications for this visit.     Review of Systems:  GENERAL:  Feels "pretty good".  No fevers or sweats.  Weight stable (up and down per patient). PERFORMANCE STATUS (ECOG):  1 HEENT:  No visual changes, runny nose, sore throat, mouth sores or tenderness. Lungs: No shortness of breath or cough.  No hemoptysis. Cardiac:  No chest pain, palpitations, orthopnea, or PND. GI: Eating well.  Reflux.  Gas.  Pressure after eating relieved by bowel movements.  No nausea, vomiting, diarrhea, constipation, melena or hematochezia. GU:  Urge incontinence.  No frequency, dysuria, or hematuria.  Enlarged prostate. Musculoskeletal:  No back pain.  Arthritis.  No muscle tenderness. Extremities:  No pain or swelling. Skin:  Peri-rectal pruritus.  No rashes or skin changes. Neuro:  No headache, numbness or weakness, balance or coordination issues. Endocrine:  No diabetes, thyroid issues, hot flashes or night sweats. Psych:  No mood changes, depression or anxiety. Pain:  No focal pain. Review of systems:  All other systems reviewed and found to be negative.   Physical Exam: There were no vitals taken for this visit. GENERAL:  Thin gentleman sitting comfortably in the exam room in no acute distress. MENTAL STATUS:  Alert and oriented to person, place and time. HEAD:   Alopecia.  Graying goatee.  Normocephalic, atraumatic, face symmetric, no Cushingoid features. EYES:  Brown eyes.  Pupils equal round and reactive to light and accomodation.   No conjunctivitis or scleral icterus. ENT:  Oropharynx clear without lesion.  Dentures.  Tongue normal. Mucous membranes moist.  RESPIRATORY:  Clear to auscultation without rales, wheezes or rhonchi. CARDIOVASCULAR:  Regular rate and rhythm without murmur, rub or gallop. ABDOMEN:  Soft, non-tender, with active bowel sounds, and no hepatosplenomegaly.  No masses. SKIN:  No rashes, ulcers or lesions. EXTREMITIES: No edema, no skin discoloration or tenderness.  No palpable cords. LYMPH NODES:  No palpable cervical, supraclavicular, axillary or inguinal adenopathy  NEUROLOGICAL: Unremarkable. PSYCH:  Appropriate.   No visits with results within 3 Day(s) from this visit.  Latest known visit with results is:  Clinical Support on 12/25/2016  Component Date Value Ref Range Status  . WBC 12/25/2016 23.4* 3.8 - 10.6 K/uL Final  . RBC 12/25/2016 4.97  4.40 - 5.90 MIL/uL Final  . Hemoglobin 12/25/2016 11.7* 13.0 - 18.0 g/dL Final  . HCT 12/25/2016 37.1* 40.0 - 52.0 % Final  . MCV 12/25/2016 74.6* 80.0 - 100.0 fL Final  . MCH 12/25/2016 23.6* 26.0 - 34.0 pg Final  . MCHC 12/25/2016 31.7* 32.0 - 36.0 g/dL Final  . RDW 12/25/2016 15.7* 11.5 - 14.5 % Final  . Platelets 12/25/2016 218  150 - 440 K/uL Final  . Neutrophils Relative % 12/25/2016 16  % Final  . Neutro Abs 12/25/2016 3.7  1.4 - 6.5 K/uL Final  . Lymphocytes Relative 12/25/2016 80  % Final  . Lymphs Abs 12/25/2016 18.7* 1.0 - 3.6 K/uL Final  . Monocytes Relative 12/25/2016 3  % Final  . Monocytes Absolute 12/25/2016 0.7  0.2 - 1.0 K/uL Final  . Eosinophils Relative 12/25/2016 1  % Final  . Eosinophils Absolute 12/25/2016 0.3  0 - 0.7 K/uL Final  . Basophils Relative 12/25/2016 0  % Final  . Basophils Absolute 12/25/2016 0.1  0 - 0.1 K/uL Final  . Sodium 12/25/2016 136  135 - 145 mmol/L Final  . Potassium 12/25/2016 4.1  3.5 - 5.1 mmol/L Final  . Chloride 12/25/2016 106  101 - 111 mmol/L Final  . CO2 12/25/2016 25  22 - 32 mmol/L Final   . Glucose, Bld 12/25/2016 95  65 - 99 mg/dL Final  . BUN 12/25/2016 13  6 - 20 mg/dL Final  . Creatinine, Ser 12/25/2016 1.13  0.61 - 1.24 mg/dL Final  . Calcium 12/25/2016 9.2  8.9 - 10.3 mg/dL Final  . Total Protein 12/25/2016 7.9  6.5 - 8.1 g/dL Final  . Albumin 12/25/2016 4.1  3.5 - 5.0 g/dL Final  . AST 12/25/2016 22  15 - 41 U/L Final  . ALT 12/25/2016 13* 17 - 63 U/L Final  . Alkaline Phosphatase 12/25/2016 48  38 - 126 U/L Final  . Total Bilirubin 12/25/2016 0.6  0.3 - 1.2 mg/dL Final  . GFR calc non Af Amer 12/25/2016 >60  >60 mL/min Final  . GFR calc Af Amer 12/25/2016 >60  >60 mL/min Final   Comment: (NOTE) The  eGFR has been calculated using the CKD EPI equation. This calculation has not been validated in all clinical situations. eGFR's persistently <60 mL/min signify possible Chronic Kidney Disease.   . Anion gap 12/25/2016 5  5 - 15 Final  . Ferritin 12/25/2016 244  24 - 336 ng/mL Final  . Sed Rate 12/25/2016 27* 0 - 20 mm/hr Final  . Amylase 12/25/2016 131* 28 - 100 U/L Final  . Lipase 12/25/2016 43  11 - 51 U/L Final    Assessment:  [name] is a 66 y.o. 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  He was treated with prednisone. With taper of prednisone, his hemolysis returned.  Chest and abdomen CT scan on 02/09/2014 revealed bulky bilateral axillary adenopathy and bulky intraperitoneal and retroperitoneal adenopathy. PET scan on 07/11/2014 revealed mildly hypermetabolic lymph nodes in the chest, abdomen, and pelvis and mildly hypermetabolic spleen.   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  He received 4 weekly cycles of Rituxan (10/02/2014 - 10/23/2014).  His hematocrit normalized.  He has slowly progressive microcytic anemia.  Colonoscopy on 07/23/2015 revealed diverticulosis in the sigmoid colon.  EGD on 08/20/2015 was normal.  Guaiac cards x  3 were negative in 06/2015.  His diet fluctuates with his appetite.  He denies any melana or hematochezia.  PET scan on 06/24/2016 revealed bilateral moderate hypermetabolic axillary lymphadenopathy increased in size (1.6 cm compared to 1.3 cm) and metabolism (SUV 3.9 compared to 3.6).  There were bilateral moderate hypermetabolic external iliac lymphadenopathy stable to decreased in metabolism.  There was mesenteric and retroperitoneal lymphadenopathy stable in size and non hypermetabolic.  There were no new sites of hypermetabolic lymphoma.  There were subcentimeter pulmonary nodules stable and below PET resolution.  FISH studies on 11/02/2016 revealed 20% of nuclei positive for loss of 1 ATM signal, 63% of nuclei positive for trisomy 12, and 32% of nuclei positive for loss of 1 TP53 signal. Results for CCND1/IGH and 13q were normal.   Symptomatically, he has upper abdominal pressure after eating relieved by bowel movements.  He has reflux and gas.  He denies any B symptoms.  Weight is stable.  Exam reveals no adenopathy.  Plan: 1.  Labs today:  CBC with diff, CMP, ferritin, ESR, amylase, lipase. 2.  Trial of omeprazole and Mylicon. 3.  Discuss scans is pain persists. 4.  RTC in 2-3 weeks for MD reassessment.   Lequita Asal, MD  01/22/2017, 6:24 AM

## 2017-02-05 ENCOUNTER — Inpatient Hospital Stay: Payer: BLUE CROSS/BLUE SHIELD | Attending: Hematology and Oncology | Admitting: Hematology and Oncology

## 2017-02-05 NOTE — Progress Notes (Deleted)
[facility] day:  02/05/17    Chief Complaint: [name] is a 66 y.o. 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  HPI:  The patient was last seen in the medical onocology clinic on 12/25/2016.  At that time, he he felt pretty good unless he ate.  He described a pressure like pain after he eats.  He felt good after laying down and having 2 bowel movements.  Pain was intermittent.  He notes gas and reflux.  He denied any B symptoms.    Labs included a hematocrit of 37.1, hemoglobin 11.7, MCV 74.6, platelets 218,000, WBC 23,400 with an [place] of 3700.  ALC was 18,700.  CMP was normal.  Ferritin was 244.  ESR was 27 (0-20).  Amylase was 131 (28-100) with a lipase of 43 (11-51).  We discussed a trial of Mylicon and omeprazole.  We discussed imaging studies if his symptoms did not improve.    Past Medical History:  Diagnosis Date  . Anemia 2008  . Arthritis   . Chronic lymphocytic leukemia (CLL), B-cell ([place]) 06/15/2015  . Hyperlipidemia   . Peripheral vascular disease [facility])     Past Surgical History:  Procedure Laterality Date  . COLONOSCOPY    . COLONOSCOPY WITH PROPOFOL N/A 07/23/2015   Procedure: COLONOSCOPY WITH PROPOFOL;  Surgeon: [name], MD;  Location: [facility] ENDOSCOPY;  Service: Endoscopy;  Laterality: N/A;  . ESOPHAGOGASTRODUODENOSCOPY (EGD) WITH PROPOFOL N/A 08/20/2015   Procedure: ESOPHAGOGASTRODUODENOSCOPY (EGD) WITH PROPOFOL;  Surgeon: [name], MD;  Location: [facility] ENDOSCOPY;  Service: Endoscopy;  Laterality: N/A;  . lymp node removal Right 2011   arm  . LYMPH NODE BIOPSY  08/2014    No family history on file.  Social History:  reports that he has been smoking Cigars.  He has never used smokeless tobacco. He reports that he does not drink alcohol or use drugs.  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  He  works 11-12 hours a day (11:30 PM - 7:30 AM at [employer]). The patient is alone today.  Allergies:  Allergies  Allergen Reactions  . Latex Rash    Current Medications: Current Outpatient Prescriptions  Medication Sig Dispense Refill  . ibuprofen (ADVIL,MOTRIN) 800 MG tablet   0  . omeprazole (PRILOSEC) 20 MG capsule Take 1 capsule (20 mg total) by mouth daily. 30 capsule 2  . tamsulosin (FLOMAX) 0.4 MG CAPS capsule Take 1 capsule (0.4 mg total) by mouth daily. 90 capsule 3   No current facility-administered medications for this visit.     Review of Systems:  GENERAL:  Feels "pretty good".  No fevers or sweats.  Weight stable (up and down per patient). PERFORMANCE STATUS (ECOG):  1 HEENT:  No visual changes, runny nose, sore throat, mouth sores or tenderness. Lungs: No shortness of breath or cough.  No hemoptysis. Cardiac:  No chest pain, palpitations, orthopnea, or PND. GI: Eating well.  Reflux.  Gas.  Pressure after eating relieved by bowel movements.  No nausea, vomiting, diarrhea, constipation, melena or hematochezia. GU:  Urge incontinence.  No frequency, dysuria, or hematuria.  Enlarged prostate. Musculoskeletal:  No back pain.  Arthritis.  No muscle tenderness. Extremities:  No pain or swelling. Skin:  Peri-rectal pruritus.  No rashes or skin changes. Neuro:  No headache, numbness or weakness, balance or coordination issues. Endocrine:  No diabetes, thyroid issues, hot flashes or night sweats. Psych:  No mood changes, depression or anxiety. Pain:  No focal pain. Review of systems:  All other systems reviewed and found to be negative.   Physical Exam: There were no vitals taken for this visit. GENERAL:  Thin gentleman sitting comfortably in the exam room in no acute distress. MENTAL STATUS:  Alert and oriented to person, place and time. HEAD:   Alopecia.  Graying goatee.  Normocephalic, atraumatic, face symmetric, no Cushingoid features. EYES:  Brown eyes.  Pupils  equal round and reactive to light and accomodation.  No conjunctivitis or scleral icterus. ENT:  Oropharynx clear without lesion.  Dentures.  Tongue normal. Mucous membranes moist.  RESPIRATORY:  Clear to auscultation without rales, wheezes or rhonchi. CARDIOVASCULAR:  Regular rate and rhythm without murmur, rub or gallop. ABDOMEN:  Soft, non-tender, with active bowel sounds, and no hepatosplenomegaly.  No masses. SKIN:  No rashes, ulcers or lesions. EXTREMITIES: No edema, no skin discoloration or tenderness.  No palpable cords. LYMPH NODES:  No palpable cervical, supraclavicular, axillary or inguinal adenopathy  NEUROLOGICAL: Unremarkable. PSYCH:  Appropriate.   No visits with results within 3 Day(s) from this visit.  Latest known visit with results is:  Clinical Support on 12/25/2016  Component Date Value Ref Range Status  . WBC 12/25/2016 23.4* 3.8 - 10.6 K/uL Final  . RBC 12/25/2016 4.97  4.40 - 5.90 MIL/uL Final  . Hemoglobin 12/25/2016 11.7* 13.0 - 18.0 g/dL Final  . HCT 12/25/2016 37.1* 40.0 - 52.0 % Final  . MCV 12/25/2016 74.6* 80.0 - 100.0 fL Final  . MCH 12/25/2016 23.6* 26.0 - 34.0 pg Final  . MCHC 12/25/2016 31.7* 32.0 - 36.0 g/dL Final  . RDW 12/25/2016 15.7* 11.5 - 14.5 % Final  . Platelets 12/25/2016 218  150 - 440 K/uL Final  . Neutrophils Relative % 12/25/2016 16  % Final  . Neutro Abs 12/25/2016 3.7  1.4 - 6.5 K/uL Final  . Lymphocytes Relative 12/25/2016 80  % Final  . Lymphs Abs 12/25/2016 18.7* 1.0 - 3.6 K/uL Final  . Monocytes Relative 12/25/2016 3  % Final  . Monocytes Absolute 12/25/2016 0.7  0.2 - 1.0 K/uL Final  . Eosinophils Relative 12/25/2016 1  % Final  . Eosinophils Absolute 12/25/2016 0.3  0 - 0.7 K/uL Final  . Basophils Relative 12/25/2016 0  % Final  . Basophils Absolute 12/25/2016 0.1  0 - 0.1 K/uL Final  . Sodium 12/25/2016 136  135 - 145 mmol/L Final  . Potassium 12/25/2016 4.1  3.5 - 5.1 mmol/L Final  . Chloride 12/25/2016 106  101 - 111  mmol/L Final  . CO2 12/25/2016 25  22 - 32 mmol/L Final  . Glucose, Bld 12/25/2016 95  65 - 99 mg/dL Final  . BUN 12/25/2016 13  6 - 20 mg/dL Final  . Creatinine, Ser 12/25/2016 1.13  0.61 - 1.24 mg/dL Final  . Calcium 12/25/2016 9.2  8.9 - 10.3 mg/dL Final  . Total Protein 12/25/2016 7.9  6.5 - 8.1 g/dL Final  . Albumin 12/25/2016 4.1  3.5 - 5.0 g/dL Final  . AST 12/25/2016 22  15 - 41 U/L Final  . ALT 12/25/2016 13* 17 - 63 U/L Final  . Alkaline Phosphatase 12/25/2016 48  38 - 126 U/L Final  . Total Bilirubin 12/25/2016 0.6  0.3 - 1.2 mg/dL Final  . GFR calc non Af Amer 12/25/2016 >60  >60 mL/min Final  . GFR calc Af [place]nia Lora  12/25/2016 >60  >60 mL/min Final   Comment: (NOTE) The eGFR has been calculated using the CKD EPI equation. This calculation has not been validated in all clinical situations. eGFR's persistently <60 mL/min signify possible Chronic Kidney Disease.   . Anion gap 12/25/2016 5  5 - 15 Final  . Ferritin 12/25/2016 244  24 - 336 ng/mL Final  . Sed Rate 12/25/2016 27* 0 - 20 mm/hr Final  . Amylase 12/25/2016 131* 28 - 100 U/L Final  . Lipase 12/25/2016 43  11 - 51 U/L Final    Assessment:  [name] is a 66 y.o. 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  He was treated with prednisone. With taper of prednisone, his hemolysis returned.  Chest and abdomen CT scan on 02/09/2014 revealed bulky bilateral axillary adenopathy and bulky intraperitoneal and retroperitoneal adenopathy. PET scan on 07/11/2014 revealed mildly hypermetabolic lymph nodes in the chest, abdomen, and pelvis and mildly hypermetabolic spleen.   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  He received 4 weekly cycles of Rituxan (10/02/2014 - 10/23/2014).  His hematocrit normalized.  He has slowly progressive microcytic anemia.  Colonoscopy on 07/23/2015 revealed diverticulosis in the  sigmoid colon.  EGD on 08/20/2015 was normal.  Guaiac cards x 3 were negative in 06/2015.  His diet fluctuates with his appetite.  He denies any melana or hematochezia.  PET scan on 06/24/2016 revealed bilateral moderate hypermetabolic axillary lymphadenopathy increased in size (1.6 cm compared to 1.3 cm) and metabolism (SUV 3.9 compared to 3.6).  There were bilateral moderate hypermetabolic external iliac lymphadenopathy stable to decreased in metabolism.  There was mesenteric and retroperitoneal lymphadenopathy stable in size and non hypermetabolic.  There were no new sites of hypermetabolic lymphoma.  There were subcentimeter pulmonary nodules stable and below PET resolution.  FISH studies on 11/02/2016 revealed 20% of nuclei positive for loss of 1 ATM signal, 63% of nuclei positive for trisomy 12, and 32% of nuclei positive for loss of 1 TP53 signal. Results for CCND1/IGH and 13q were normal.   Symptomatically, he has upper abdominal pressure after eating relieved by bowel movements.  He has reflux and gas.  He denies any B symptoms.  Weight is stable.  Exam reveals no adenopathy.  Plan: 1.  Labs today:  CBC with diff, CMP, ferritin, ESR, amylase, lipase. 2.  Trial of omeprazole and Mylicon. 3.  Discuss scans is pain persists. 4.  RTC in 2-3 weeks for MD reassessment.   Lequita Asal, MD  02/05/2017, 5:09 AM

## 2017-03-05 ENCOUNTER — Encounter: Payer: Self-pay | Admitting: Urology

## 2017-03-05 ENCOUNTER — Ambulatory Visit (INDEPENDENT_AMBULATORY_CARE_PROVIDER_SITE_OTHER): Payer: BLUE CROSS/BLUE SHIELD | Admitting: Urology

## 2017-03-05 VITALS — BP 128/73 | HR 52 | Ht 68.0 in | Wt 132.8 lb

## 2017-03-05 DIAGNOSIS — Z125 Encounter for screening for malignant neoplasm of prostate: Secondary | ICD-10-CM | POA: Diagnosis not present

## 2017-03-05 DIAGNOSIS — N3281 Overactive bladder: Secondary | ICD-10-CM

## 2017-03-05 DIAGNOSIS — N400 Enlarged prostate without lower urinary tract symptoms: Secondary | ICD-10-CM

## 2017-03-05 LAB — URINALYSIS, COMPLETE
Bilirubin, UA: NEGATIVE
Glucose, UA: NEGATIVE
LEUKOCYTES UA: NEGATIVE
NITRITE UA: NEGATIVE
Urobilinogen, Ur: 0.2 mg/dL (ref 0.2–1.0)
pH, UA: 5 (ref 5.0–7.5)

## 2017-03-05 LAB — MICROSCOPIC EXAMINATION: Epithelial Cells (non renal): NONE SEEN /hpf (ref 0–10)

## 2017-03-05 LAB — BLADDER SCAN AMB NON-IMAGING: Scan Result: 0

## 2017-03-05 MED ORDER — MIRABEGRON ER 25 MG PO TB24: 25.0000 mg | ORAL_TABLET | Freq: Every day | ORAL | 11 refills | Status: DC

## 2017-03-05 NOTE — Progress Notes (Signed)
03/05/2017 4:25 PM   [name] February 13, 1951 [id number]  Referring provider: No referring provider defined for this encounter.  Chief Complaint  Patient presents with  . New Patient (Initial Visit)    BPH w/ urinary incontinence    HPI: The patient is a 66 year old gentleman with a past metal history of BPH on Flomax and B-cell chronic lymphocytic leukemia who presents today for urinary urgency with incontinence. His biggest complaint is urge to go the bathroom and if he does not make an time he often has incontinence and his pants. This has been going on for proximally 6 months. He finds incredible bothersome. He states that he very happy if this were to be resolved. His I PSS score is 18/5. He does have nocturia 0. He did note feeling of incomplete bladder emptying almost always though his PVR today is 0. He about half the time has frequency, intermittency, urgency, and weak stream. He does not strain. But on direct and, his only concern was his urinary urgency with incontinence.   PMH: Past Medical History:  Diagnosis Date  . Anemia 2008  . Arthritis   . Chronic lymphocytic leukemia (CLL), B-cell ([place]) 06/15/2015  . Hyperlipidemia   . Peripheral vascular disease [facility])     Surgical History: Past Surgical History:  Procedure Laterality Date  . COLONOSCOPY    . COLONOSCOPY WITH PROPOFOL N/A 07/23/2015   Procedure: COLONOSCOPY WITH PROPOFOL;  Surgeon: [name], MD;  Location: [facility] ENDOSCOPY;  Service: Endoscopy;  Laterality: N/A;  . ESOPHAGOGASTRODUODENOSCOPY (EGD) WITH PROPOFOL N/A 08/20/2015   Procedure: ESOPHAGOGASTRODUODENOSCOPY (EGD) WITH PROPOFOL;  Surgeon: [name], MD;  Location: [facility] ENDOSCOPY;  Service: Endoscopy;  Laterality: N/A;  . lymp node removal Right 2011   arm  . LYMPH NODE BIOPSY  08/2014    Home Medications:  Allergies as of 03/05/2017      Reactions   Latex Rash      Medication List       Accurate as of 03/05/17  4:25 PM. Always  use your most recent med list.          ibuprofen 800 MG tablet Commonly known as:  ADVIL,MOTRIN   mirabegron ER 25 MG Tb24 tablet Commonly known as:  MYRBETRIQ Take 1 tablet (25 mg total) by mouth daily.   omeprazole 20 MG capsule Commonly known as:  PRILOSEC Take 1 capsule (20 mg total) by mouth daily.   tamsulosin 0.4 MG Caps capsule Commonly known as:  FLOMAX Take 1 capsule (0.4 mg total) by mouth daily.       Allergies:  Allergies  Allergen Reactions  . Latex Rash    Family History: No family history on file.  Social History:  reports that he has been smoking Cigars.  He has never used smokeless tobacco. He reports that he does not drink alcohol or use drugs.  ROS: UROLOGY Frequent Urination?: Yes Hard to postpone urination?: Yes Burning/pain with urination?: No Get up at night to urinate?: No Leakage of urine?: No Urine stream starts and stops?: No Trouble starting stream?: No Do you have to strain to urinate?: No Blood in urine?: No Urinary tract infection?: No Sexually transmitted disease?: No Injury to kidneys or bladder?: No Painful intercourse?: No Weak stream?: No Erection problems?: No Penile pain?: No  Physical Exam: BP 128/73   Pulse (!) 52   Ht 5\' 8"  (1.727 m)   Wt 132 lb 12.8 oz (60.2 kg)   BMI 20.19 kg/m   Constitutional:  Alert and oriented, No acute distress. HEENT: [place] AT, moist mucus membranes.  Trachea midline, no masses. Cardiovascular: No clubbing, cyanosis, or edema. Respiratory: Normal respiratory effort, no increased work of breathing. GI: Abdomen is soft, nontender, nondistended, no abdominal masses GU: No CVA tenderness. Normal phallus. Testicles and equal bilaterally without masses. DRE: 2+ smooth benign. Skin: No rashes, bruises or suspicious lesions. Lymph: No cervical or inguinal adenopathy. Neurologic: Grossly intact, no focal deficits, moving all 4  extremities. Psychiatric: Normal mood and affect.  Laboratory Data: Lab Results  Component Value Date   WBC 23.4 (H) 12/25/2016   HGB 11.7 (L) 12/25/2016   HCT 37.1 (L) 12/25/2016   MCV 74.6 (L) 12/25/2016   PLT 218 12/25/2016    Lab Results  Component Value Date   CREATININE 1.13 12/25/2016    No results found for: PSA  No results found for: TESTOSTERONE  Lab Results  Component Value Date   HGBA1C 5.8 10/14/2016    Urinalysis    Component Value Date/Time   COLORURINE YELLOW (A) 11/02/2016 1308   APPEARANCEUR CLEAR (A) 11/02/2016 1308   APPEARANCEUR Clear 02/07/2014 2350   LABSPEC 1.020 11/02/2016 1308   LABSPEC 1.014 02/07/2014 2350   PHURINE 6.0 11/02/2016 1308   GLUCOSEU NEGATIVE 11/02/2016 1308   GLUCOSEU Negative 02/07/2014 2350   HGBUR 1+ (A) 11/02/2016 1308   BILIRUBINUR NEGATIVE 11/02/2016 1308   BILIRUBINUR Negative 02/07/2014 2350   KETONESUR NEGATIVE 11/02/2016 1308   PROTEINUR NEGATIVE 11/02/2016 1308   NITRITE NEGATIVE 11/02/2016 1308   LEUKOCYTESUR NEGATIVE 11/02/2016 1308   LEUKOCYTESUR Negative 02/07/2014 2350    Assessment & Plan:    1. Overactive bladder The patient's symptoms are more consistent with overactive bladder. We'll start him on Myrbetriq 25 mg daily.   2. BPH Continue Flomax for now though I think his symptoms more consistent with above  3. Prostate cancer screening Due for PSA  Return in about 3 months (around 06/05/2017).  Nickie Retort, MD  [facility] [address] [place], [place] [address] [phone]

## 2017-03-06 LAB — PSA: PROSTATE SPECIFIC AG, SERUM: 1.5 ng/mL (ref 0.0–4.0)

## 2017-03-10 ENCOUNTER — Telehealth: Payer: Self-pay

## 2017-03-10 NOTE — Telephone Encounter (Signed)
Joseph Retort, MD  Lestine Box, LPN        Please let patient know PSA is normal. Follow up as previously scheduled    No answer. No vm set up.

## 2017-03-11 NOTE — Telephone Encounter (Signed)
Spoke with pt in reference to PSA results. Pt voiced understanding.  

## 2017-04-20 ENCOUNTER — Ambulatory Visit (INDEPENDENT_AMBULATORY_CARE_PROVIDER_SITE_OTHER): Payer: BLUE CROSS/BLUE SHIELD | Admitting: Podiatry

## 2017-04-20 ENCOUNTER — Encounter: Payer: Self-pay | Admitting: Podiatry

## 2017-04-20 DIAGNOSIS — M79672 Pain in left foot: Secondary | ICD-10-CM

## 2017-04-20 DIAGNOSIS — L851 Acquired keratosis [keratoderma] palmaris et plantaris: Secondary | ICD-10-CM | POA: Diagnosis not present

## 2017-04-20 DIAGNOSIS — M79671 Pain in right foot: Secondary | ICD-10-CM | POA: Diagnosis not present

## 2017-04-20 DIAGNOSIS — L84 Corns and callosities: Secondary | ICD-10-CM | POA: Diagnosis not present

## 2017-04-21 NOTE — Progress Notes (Signed)
   Subjective: Patient presents to the office today for chief complaint of burning, stinging painful callus lesions of bilateral feet that has been ongoing for the past 6 years. He is concerned that he may have plantar warts. The pain is worse with ambulation and he denies alleviating factors. He has been soaking the feet in Epsom salt with no significant relief.  Objective:  Physical Exam General: Alert and oriented x3 in no acute distress  Dermatology: Hyperkeratotic lesion present on the plantar aspect of bilateral ffet. Pain on palpation with a central nucleated core noted.  Skin is warm, dry and supple bilateral lower extremities. Negative for open lesions or macerations.  Vascular: Palpable pedal pulses bilaterally. No edema or erythema noted. Capillary refill within normal limits.  Neurological: Epicritic and protective threshold grossly intact bilaterally.   Musculoskeletal Exam: Pain on palpation at the keratotic lesion noted. Range of motion within normal limits bilateral. Muscle strength 5/5 in all groups bilateral.  Assessment: #1 Porokeratosis of bilateral feet  Plan of Care:  #1 Patient evaluated #2 Excisional debridement of keratoic lesion using a chisel blade was performed without incident.  #3 Treated area(s) with Salinocaine and dressed with light dressing. #4 Patient is to return to the clinic PRN.   Edrick Kins, DPM [facility]  Dr. Edrick Kins, [address]                                        [place], [place] [address]                Office [phone]  Fax [phone]

## 2017-06-02 ENCOUNTER — Encounter: Payer: Self-pay | Admitting: Urology

## 2017-06-02 ENCOUNTER — Ambulatory Visit (INDEPENDENT_AMBULATORY_CARE_PROVIDER_SITE_OTHER): Payer: BLUE CROSS/BLUE SHIELD | Admitting: Urology

## 2017-06-02 VITALS — BP 112/69 | HR 49 | Ht 68.0 in | Wt 131.8 lb

## 2017-06-02 DIAGNOSIS — N400 Enlarged prostate without lower urinary tract symptoms: Secondary | ICD-10-CM

## 2017-06-02 DIAGNOSIS — N3281 Overactive bladder: Secondary | ICD-10-CM

## 2017-06-02 MED ORDER — MIRABEGRON ER 25 MG PO TB24: 25.0000 mg | ORAL_TABLET | Freq: Every day | ORAL | 11 refills | Status: DC

## 2017-06-02 NOTE — Progress Notes (Signed)
06/02/2017 9:52 AM   [name] 12/03/1951 [id number]  Referring provider: No referring provider defined for this encounter.  Chief Complaint  Patient presents with  . Benign Prostatic Hypertrophy    3 month follow up   . Over Active Bladder    HPI: The patient is a 66 year old gentleman with a past metal history of BPH on Flomax and B-cell chronic lymphocytic leukemia who presents today for urinary urgency with incontinence. His biggest complaint was urge to go the bathroom and if he does not make an time he often has incontinence and his pants. This has been going on for proximally 6 months. He finds it incredibly bothersome. He states that he very happy if this were to be resolved. His IPSS score was 18/5. He does have nocturia 0. He did note feeling of incomplete bladder emptying almost always though his PVR was 0. He about half the time has frequency, intermittency, urgency, and weak stream. He does not strain. But on questioning, his only concern was his urinary urgency with incontinence.    He was started on Myrbetriq 25 mg as last visit for above symptoms. He states that is working well. Relieve the symptoms above urgency and urge incontinence. He is very happy with this medication. However, he did run out of medication. He is requesting a refill of this time.  PSA was 1.5 in March 2018 with benign DRE  PMH: Past Medical History:  Diagnosis Date  . Anemia 2008  . Arthritis   . Chronic lymphocytic leukemia (CLL), B-cell ([place]) 06/15/2015  . Hyperlipidemia   . Peripheral vascular disease [facility])     Surgical History: Past Surgical History:  Procedure Laterality Date  . COLONOSCOPY    . COLONOSCOPY WITH PROPOFOL N/A 07/23/2015   Procedure: COLONOSCOPY WITH PROPOFOL;  Surgeon: [name], MD;  Location: [facility] ENDOSCOPY;  Service: Endoscopy;  Laterality: N/A;  . ESOPHAGOGASTRODUODENOSCOPY (EGD) WITH PROPOFOL N/A 08/20/2015   Procedure: ESOPHAGOGASTRODUODENOSCOPY (EGD)  WITH PROPOFOL;  Surgeon: [name], MD;  Location: [facility] ENDOSCOPY;  Service: Endoscopy;  Laterality: N/A;  . lymp node removal Right 2011   arm   left arm 2015  . LYMPH NODE BIOPSY  08/2014    Home Medications:  Allergies as of 06/02/2017      Reactions   Latex Rash      Medication List       Accurate as of 06/02/17  9:52 AM. Always use your most recent med list.          ibuprofen 800 MG tablet Commonly known as:  ADVIL,MOTRIN   tamsulosin 0.4 MG Caps capsule Commonly known as:  FLOMAX Take 0.4 mg by mouth.       Allergies:  Allergies  Allergen Reactions  . Latex Rash    Family History: Family History  Problem Relation Age of Onset  . Kidney cancer Neg Hx   . Bladder Cancer Neg Hx   . Prostate cancer Neg Hx     Social History:  reports that he has been smoking Cigars.  He has never used smokeless tobacco. He reports that he does not drink alcohol or use drugs.  ROS: UROLOGY Frequent Urination?: No Hard to postpone urination?: No Burning/pain with urination?: No Get up at night to urinate?: No Leakage of urine?: Yes Urine stream starts and stops?: Yes Trouble starting stream?: Yes Do you have to strain to urinate?: No Blood in urine?: Yes Urinary tract infection?: No Sexually transmitted disease?: No Injury to kidneys  or bladder?: No Painful intercourse?: No Weak stream?: No Erection problems?: No Penile pain?: No  Gastrointestinal Nausea?: No Vomiting?: No Indigestion/heartburn?: No Diarrhea?: No Constipation?: No  Constitutional Fever: No Night sweats?: No Weight loss?: Yes Fatigue?: Yes  Skin Skin rash/lesions?: Yes Itching?: Yes  Eyes Blurred vision?: Yes Double vision?: Yes  Ears/Nose/Throat Sore throat?: No Sinus problems?: No  Hematologic/Lymphatic Swollen glands?: No Easy bruising?: No  Cardiovascular Leg swelling?: No Chest pain?: No  Respiratory Cough?: No Shortness of breath?:  No  Endocrine Excessive thirst?: Yes  Musculoskeletal Back pain?: Yes Joint pain?: Yes  Neurological Headaches?: No Dizziness?: No  Psychologic Depression?: No Anxiety?: No  Physical Exam: BP 112/69   Pulse (!) 49   Ht 5\' 8"  (1.727 m)   Wt 131 lb 12.8 oz (59.8 kg)   BMI 20.04 kg/m   Constitutional:  Alert and oriented, No acute distress. HEENT: [place] AT, moist mucus membranes.  Trachea midline, no masses. Cardiovascular: No clubbing, cyanosis, or edema. Respiratory: Normal respiratory effort, no increased work of breathing. GI: Abdomen is soft, nontender, nondistended, no abdominal masses GU: No CVA tenderness.  Skin: No rashes, bruises or suspicious lesions. Lymph: No cervical or inguinal adenopathy. Neurologic: Grossly intact, no focal deficits, moving all 4 extremities. Psychiatric: Normal mood and affect.  Laboratory Data: Lab Results  Component Value Date   WBC 23.4 (H) 12/25/2016   HGB 11.7 (L) 12/25/2016   HCT 37.1 (L) 12/25/2016   MCV 74.6 (L) 12/25/2016   PLT 218 12/25/2016    Lab Results  Component Value Date   CREATININE 1.13 12/25/2016    No results found for: PSA  No results found for: TESTOSTERONE  Lab Results  Component Value Date   HGBA1C 5.8 10/14/2016    Urinalysis    Component Value Date/Time   COLORURINE YELLOW (A) 11/02/2016 1308   APPEARANCEUR Clear 03/05/2017 1556   LABSPEC 1.020 11/02/2016 1308   LABSPEC 1.014 02/07/2014 2350   PHURINE 6.0 11/02/2016 1308   GLUCOSEU Negative 03/05/2017 1556   GLUCOSEU Negative 02/07/2014 2350   HGBUR 1+ (A) 11/02/2016 1308   BILIRUBINUR Negative 03/05/2017 1556   BILIRUBINUR Negative 02/07/2014 2350   KETONESUR NEGATIVE 11/02/2016 1308   PROTEINUR Trace (A) 03/05/2017 1556   PROTEINUR NEGATIVE 11/02/2016 1308   NITRITE Negative 03/05/2017 1556   NITRITE NEGATIVE 11/02/2016 1308   LEUKOCYTESUR Negative 03/05/2017 1556   LEUKOCYTESUR Negative 02/07/2014 2350    Assessment & Plan:     1. Overactive bladder Restart Myrbetriq 25 mg daily. Follow up annually.  2. BPH Continue Flomax   3. Prostate cancer screening Up to date  Return in about 1 year (around 06/02/2018).  Nickie Retort, MD  [facility] [address] [place], [place] [address] [phone]

## 2017-07-14 ENCOUNTER — Telehealth: Payer: Self-pay | Admitting: *Deleted

## 2017-07-14 NOTE — Telephone Encounter (Signed)
Called patient and discussed his call r/t not feeling good. Dr. Mike Gip informed, will send information to scheduler to make an appt, patient informed voiced understanding.

## 2017-07-20 ENCOUNTER — Inpatient Hospital Stay: Payer: BLUE CROSS/BLUE SHIELD | Attending: Oncology

## 2017-07-20 ENCOUNTER — Encounter: Payer: Self-pay | Admitting: Oncology

## 2017-07-20 ENCOUNTER — Inpatient Hospital Stay (HOSPITAL_BASED_OUTPATIENT_CLINIC_OR_DEPARTMENT_OTHER): Payer: BLUE CROSS/BLUE SHIELD | Admitting: Oncology

## 2017-07-20 VITALS — BP 116/70 | HR 46 | Temp 96.7°F | Resp 20 | Wt 128.6 lb

## 2017-07-20 DIAGNOSIS — I739 Peripheral vascular disease, unspecified: Secondary | ICD-10-CM | POA: Diagnosis not present

## 2017-07-20 DIAGNOSIS — D509 Iron deficiency anemia, unspecified: Secondary | ICD-10-CM | POA: Diagnosis not present

## 2017-07-20 DIAGNOSIS — D589 Hereditary hemolytic anemia, unspecified: Secondary | ICD-10-CM

## 2017-07-20 DIAGNOSIS — C9110 Chronic lymphocytic leukemia of B-cell type not having achieved remission: Secondary | ICD-10-CM | POA: Diagnosis not present

## 2017-07-20 DIAGNOSIS — R634 Abnormal weight loss: Secondary | ICD-10-CM

## 2017-07-20 DIAGNOSIS — K5790 Diverticulosis of intestine, part unspecified, without perforation or abscess without bleeding: Secondary | ICD-10-CM

## 2017-07-20 DIAGNOSIS — E785 Hyperlipidemia, unspecified: Secondary | ICD-10-CM

## 2017-07-20 DIAGNOSIS — R630 Anorexia: Secondary | ICD-10-CM

## 2017-07-20 DIAGNOSIS — R5383 Other fatigue: Secondary | ICD-10-CM

## 2017-07-20 LAB — CBC WITH DIFFERENTIAL/PLATELET
BASOS ABS: 0.1 10*3/uL (ref 0–0.1)
Basophils Relative: 0 %
EOS ABS: 0.3 10*3/uL (ref 0–0.7)
EOS PCT: 1 %
HCT: 38.7 % — ABNORMAL LOW (ref 40.0–52.0)
Hemoglobin: 12.4 g/dL — ABNORMAL LOW (ref 13.0–18.0)
Lymphocytes Relative: 80 %
Lymphs Abs: 20.8 10*3/uL — ABNORMAL HIGH (ref 1.0–3.6)
MCH: 23.8 pg — ABNORMAL LOW (ref 26.0–34.0)
MCHC: 32 g/dL (ref 32.0–36.0)
MCV: 74.3 fL — ABNORMAL LOW (ref 80.0–100.0)
MONO ABS: 0.6 10*3/uL (ref 0.2–1.0)
MONOS PCT: 2 %
Neutro Abs: 4.6 10*3/uL (ref 1.4–6.5)
Neutrophils Relative %: 17 %
PLATELETS: 221 10*3/uL (ref 150–440)
RBC: 5.21 MIL/uL (ref 4.40–5.90)
RDW: 15.8 % — AB (ref 11.5–14.5)
WBC: 26.4 10*3/uL — ABNORMAL HIGH (ref 3.8–10.6)

## 2017-07-20 LAB — COMPREHENSIVE METABOLIC PANEL
ALT: 13 U/L — ABNORMAL LOW (ref 17–63)
ANION GAP: 4 — AB (ref 5–15)
AST: 21 U/L (ref 15–41)
Albumin: 4.1 g/dL (ref 3.5–5.0)
Alkaline Phosphatase: 46 U/L (ref 38–126)
BUN: 12 mg/dL (ref 6–20)
CALCIUM: 9.5 mg/dL (ref 8.9–10.3)
CHLORIDE: 105 mmol/L (ref 101–111)
CO2: 27 mmol/L (ref 22–32)
Creatinine, Ser: 0.99 mg/dL (ref 0.61–1.24)
GFR calc non Af Amer: >60 mL/min (ref >60)
Glucose, Bld: 135 mg/dL — ABNORMAL HIGH (ref 65–99)
POTASSIUM: 4.1 mmol/L (ref 3.5–5.1)
SODIUM: 136 mmol/L (ref 135–145)
Total Bilirubin: 0.7 mg/dL (ref 0.3–1.2)
Total Protein: 7.2 g/dL (ref 6.5–8.1)

## 2017-07-20 LAB — SAMPLE TO BLOOD BANK

## 2017-07-20 LAB — LACTATE DEHYDROGENASE: LDH: 121 U/L (ref 98–192)

## 2017-07-20 NOTE — Progress Notes (Signed)
[facility] day:  07/20/17    Chief Complaint: [name] is a 66 y.o. 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  HPI:  The patient was last seen in the medical onocology clinic on 12/25/2016. Patient reports feeling profound fatigue, lack of appetite, weight loss. He weight 128 pounds today, comparing to 137 pounds when he was here in January 2018.  He met with [name], registered dietitian on 01/04/2017. He missed his  follow-up with Dr. Mike Gip.   He originally presented with hemolytic anemia in 01/2014.  He was treated with prednisone. With taper of prednisone, his hemolysis returned. He received 4 weekly cycles of Rituxan (10/02/2014).  He has slowly progressive microcytic anemia.  Colonoscopy on 07/23/2015 revealed diverticulosis in the sigmoid colon.  EGD on 08/20/2015 was normal. Guaiac cards x 3 were negative in 06/2015.  His diet fluctuates with his appetite.  He denies any melana or hematochezia.  Past Medical History:  Diagnosis Date  . Anemia 2008  . Arthritis   . Chronic lymphocytic leukemia (CLL), B-cell ([place]) 06/15/2015  . Hyperlipidemia   . Peripheral vascular disease [facility])     Past Surgical History:  Procedure Laterality Date  . COLONOSCOPY    . COLONOSCOPY WITH PROPOFOL N/A 07/23/2015   Procedure: COLONOSCOPY WITH PROPOFOL;  Surgeon: [name], MD;  Location: [facility] ENDOSCOPY;  Service: Endoscopy;  Laterality: N/A;  . ESOPHAGOGASTRODUODENOSCOPY (EGD) WITH PROPOFOL N/A 08/20/2015   Procedure: ESOPHAGOGASTRODUODENOSCOPY (EGD) WITH PROPOFOL;  Surgeon: [name], MD;  Location: [facility] ENDOSCOPY;  Service: Endoscopy;  Laterality: N/A;  . lymp node removal Right 2011   arm   left arm 2015  . LYMPH NODE BIOPSY  08/2014    Family History  Problem Relation Age of Onset  . Kidney cancer Neg Hx   . Bladder Cancer Neg Hx   . Prostate cancer Neg Hx      Social History:  reports that he has been smoking Cigars.  He has never used smokeless tobacco. He reports that he does not drink alcohol or use drugs.  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 The patient is alone today.  Allergies:  Allergies  Allergen Reactions  . Latex Rash    Current Medications: No current outpatient prescriptions on file.   No current facility-administered medications for this visit.     Review of Systems:  GENERAL:  Feels "pretty good".  No fevers or sweats.  Weight stable (up and down per patient). PERFORMANCE STATUS (ECOG):  1 HEENT:  No visual changes, runny nose, sore throat, mouth sores or tenderness. Lungs: No shortness of breath or cough.  No hemoptysis. Cardiac:  No chest pain, palpitations, orthopnea, or PND. GI: Eating well.  Reflux.  Gas.  Pressure after eating relieved by bowel movements.  No nausea, vomiting, diarrhea, constipation, melena or hematochezia. GU:  Urge incontinence.  No frequency, dysuria, or hematuria.  Enlarged prostate. Musculoskeletal:  No back pain.  Arthritis.  No muscle tenderness. Extremities:  No pain or swelling. Skin:  Peri-rectal pruritus.  No rashes or skin changes. Neuro:  No headache, numbness or weakness, balance or coordination issues. Endocrine:  No diabetes, thyroid issues, hot flashes or night sweats. Psych:  No mood changes, depression or anxiety. Pain:  No focal pain.  Review of systems:  All other systems reviewed and found to be negative.   Physical Exam: Blood pressure 116/70, pulse (!) 46, temperature (!) 96.7 F (35.9 C), temperature source Tympanic, resp. rate 20, weight 128 lb 9.6 oz (58.3 kg). GENERAL:  Thin gentleman sitting comfortably in the exam room in no acute distress. MENTAL STATUS:  Alert and oriented to person, place and time. HEAD:   Alopecia.  Graying goatee.   Normocephalic, atraumatic, face symmetric, no Cushingoid features. EYES:  Brown eyes.  Pupils equal round and reactive to light and accomodation.  No conjunctivitis or scleral icterus. ENT:  Oropharynx clear without lesion.  Dentures.  Tongue normal. Mucous membranes moist.  RESPIRATORY:  Clear to auscultation without rales, wheezes or rhonchi. CARDIOVASCULAR:  Regular rate and rhythm without murmur, rub or gallop. ABDOMEN:  Soft, non-tender, with active bowel sounds, and no hepatosplenomegaly.  No masses. SKIN:  No rashes, ulcers or lesions. EXTREMITIES: No edema, no skin discoloration or tenderness.  No palpable cords. LYMPH NODES:  right supraclavicular small mobile lymph node. Axillary lymphadenopathy bilateral, right >left. The largest one on the right is about 2cm. NEUROLOGICAL: Unremarkable. PSYCH:  Appropriate.   Appointment on 07/20/2017  Component Date Value Ref Range Status  . WBC 07/20/2017 26.4* 3.8 - 10.6 K/uL Final  . RBC 07/20/2017 5.21  4.40 - 5.90 MIL/uL Final  . Hemoglobin 07/20/2017 12.4* 13.0 - 18.0 g/dL Final  . HCT 07/20/2017 38.7* 40.0 - 52.0 % Final  . MCV 07/20/2017 74.3* 80.0 - 100.0 fL Final  . MCH 07/20/2017 23.8* 26.0 - 34.0 pg Final  . MCHC 07/20/2017 32.0  32.0 - 36.0 g/dL Final  . RDW 07/20/2017 15.8* 11.5 - 14.5 % Final  . Platelets 07/20/2017 221  150 - 440 K/uL Final  . Neutrophils Relative % 07/20/2017 17  % Final  . Neutro Abs 07/20/2017 4.6  1.4 - 6.5 K/uL Final  . Lymphocytes Relative 07/20/2017 80  % Final  . Lymphs Abs 07/20/2017 20.8* 1.0 - 3.6 K/uL Final  . Monocytes Relative 07/20/2017 2  % Final  . Monocytes Absolute 07/20/2017 0.6  0.2 - 1.0 K/uL Final  . Eosinophils Relative 07/20/2017 1  % Final  . Eosinophils Absolute 07/20/2017 0.3  0 - 0.7 K/uL Final  . Basophils Relative 07/20/2017 0  % Final  . Basophils Absolute 07/20/2017 0.1  0 - 0.1 K/uL Final  . Sodium 07/20/2017 136  135 - 145 mmol/L Final  . Potassium 07/20/2017 4.1  3.5  - 5.1 mmol/L Final  . Chloride 07/20/2017 105  101 - 111 mmol/L Final  . CO2 07/20/2017 27  22 - 32 mmol/L Final  . Glucose, Bld 07/20/2017 135* 65 - 99 mg/dL Final  . BUN 07/20/2017 12  6 - 20 mg/dL Final  . Creatinine, Ser 07/20/2017 0.99  0.61 - 1.24 mg/dL Final  . Calcium 07/20/2017 9.5  8.9 - 10.3 mg/dL Final  . Total Protein 07/20/2017 7.2  6.5 - 8.1 g/dL Final  . Albumin 07/20/2017 4.1  3.5 - 5.0 g/dL Final  . AST 07/20/2017 21  15 - 41 U/L Final  . ALT 07/20/2017 13* 17 - 63 U/L Final  . Alkaline Phosphatase 07/20/2017 46  38 - 126 U/L Final  . Total Bilirubin 07/20/2017 0.7  0.3 - 1.2 mg/dL Final  . GFR calc non Af Amer 07/20/2017 >60  >60 mL/min Final  . GFR calc Af Amer 07/20/2017 >60  >60 mL/min Final   Comment: (NOTE) The eGFR has  been calculated using the CKD EPI equation. This calculation has not been validated in all clinical situations. eGFR's persistently <60 mL/min signify possible Chronic Kidney Disease.   . Anion gap 07/20/2017 4* 5 - 15 Final  . LDH 07/20/2017 121  98 - 192 U/L Final  . Blood Bank Specimen 07/20/2017 SAMPLE AVAILABLE FOR TESTING   Final  . Sample Expiration 07/20/2017 07/23/2017   Final   FISH studies on 11/02/2016 revealed 20% of nuclei positive for loss of 1 ATM signal, 63% of nuclei positive for trisomy 12, and 32% of nuclei positive for loss of 1 TP53 signal. Results for CCND1/IGH and 13q were normal.   Chest and abdomen CT scan on 02/09/2014 revealed bulky bilateral axillary adenopathy and bulky intraperitoneal and retroperitoneal adenopathy. PET scan on 07/11/2014 revealed mildly hypermetabolic lymph nodes in the chest, abdomen, and pelvis and mildly hypermetabolic spleen.   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  He received 4 weekly cycles of Rituxan (10/02/2014 - 10/23/2014).  His hematocrit normalized. PET scan on 06/24/2016 revealed bilateral moderate hypermetabolic axillary  lymphadenopathy increased in size (1.6 cm compared to 1.3 cm) and metabolism (SUV 3.9 compared to 3.6).  There were bilateral moderate hypermetabolic external iliac lymphadenopathy stable to decreased in metabolism.  There was mesenteric and retroperitoneal lymphadenopathy stable in size and non hypermetabolic.  There were no new sites of hypermetabolic lymphoma.  There were subcentimeter pulmonary nodules stable and below PET resolution.   Assessment:  [name] is a 66 y.o. 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  He was treated with prednisone. With taper of prednisone, his hemolysis returned.  # CLL/SLL # Weight loss # Fatigue # increasing lymphadenopathy.   Patient does have severe constitutional symptoms, including profound weight loss, fatigue and increase of lymph node.   Plan: 1.  Labs today:  CBC with diff, CMP, iron panel, LDH 2.  CT chest and abdomen, pelvis scan to further evaluation.  3.  Likely need treatment giving that he is very symptomatic.   Follow up with me 1-2 days after CT scan.    Earlie Server, MD  07/20/2017, 1:36 PM

## 2017-07-20 NOTE — Progress Notes (Signed)
Patient here today for follow up regarding CLL. Patient reports fatigue, weakness, itching and rash to groin area, decreased appetite, frequency with urination and abdominal pain/fullness.

## 2017-07-21 ENCOUNTER — Ambulatory Visit
Admission: RE | Admit: 2017-07-21 | Discharge: 2017-07-21 | Disposition: A | Discharge status: Home / Self Care | Payer: BLUE CROSS/BLUE SHIELD | Source: Ambulatory Visit | Attending: Oncology | Admitting: Oncology

## 2017-07-21 DIAGNOSIS — R591 Generalized enlarged lymph nodes: Secondary | ICD-10-CM | POA: Diagnosis not present

## 2017-07-21 DIAGNOSIS — I700 Atherosclerosis of aorta: Secondary | ICD-10-CM | POA: Diagnosis not present

## 2017-07-21 DIAGNOSIS — N400 Enlarged prostate without lower urinary tract symptoms: Secondary | ICD-10-CM | POA: Insufficient documentation

## 2017-07-21 DIAGNOSIS — C9110 Chronic lymphocytic leukemia of B-cell type not having achieved remission: Secondary | ICD-10-CM | POA: Diagnosis not present

## 2017-07-21 DIAGNOSIS — R634 Abnormal weight loss: Secondary | ICD-10-CM

## 2017-07-21 DIAGNOSIS — R918 Other nonspecific abnormal finding of lung field: Secondary | ICD-10-CM | POA: Diagnosis not present

## 2017-07-21 MED ORDER — IOPAMIDOL (ISOVUE-300) INJECTION 61%
100.0000 mL | Freq: Once | INTRAVENOUS | Status: AC | PRN
  Administered 2017-07-21: 100 mL via INTRAVENOUS

## 2017-07-23 ENCOUNTER — Inpatient Hospital Stay: Payer: BLUE CROSS/BLUE SHIELD | Attending: Oncology | Admitting: Oncology

## 2017-07-23 ENCOUNTER — Encounter: Payer: Self-pay | Admitting: Oncology

## 2017-07-23 ENCOUNTER — Inpatient Hospital Stay: Payer: BLUE CROSS/BLUE SHIELD

## 2017-07-23 ENCOUNTER — Other Ambulatory Visit: Payer: Self-pay | Admitting: Oncology

## 2017-07-23 ENCOUNTER — Other Ambulatory Visit: Payer: Self-pay | Admitting: *Deleted

## 2017-07-23 VITALS — BP 102/65 | HR 54 | Temp 97.5°F | Resp 20 | Wt 134.7 lb

## 2017-07-23 DIAGNOSIS — F17210 Nicotine dependence, cigarettes, uncomplicated: Secondary | ICD-10-CM | POA: Diagnosis not present

## 2017-07-23 DIAGNOSIS — D561 Beta thalassemia: Secondary | ICD-10-CM | POA: Diagnosis not present

## 2017-07-23 DIAGNOSIS — R634 Abnormal weight loss: Secondary | ICD-10-CM

## 2017-07-23 DIAGNOSIS — R197 Diarrhea, unspecified: Secondary | ICD-10-CM | POA: Diagnosis not present

## 2017-07-23 DIAGNOSIS — Z79899 Other long term (current) drug therapy: Secondary | ICD-10-CM

## 2017-07-23 DIAGNOSIS — D509 Iron deficiency anemia, unspecified: Secondary | ICD-10-CM | POA: Diagnosis not present

## 2017-07-23 DIAGNOSIS — R5383 Other fatigue: Secondary | ICD-10-CM

## 2017-07-23 DIAGNOSIS — D589 Hereditary hemolytic anemia, unspecified: Secondary | ICD-10-CM | POA: Diagnosis not present

## 2017-07-23 DIAGNOSIS — C9110 Chronic lymphocytic leukemia of B-cell type not having achieved remission: Secondary | ICD-10-CM

## 2017-07-23 DIAGNOSIS — I739 Peripheral vascular disease, unspecified: Secondary | ICD-10-CM | POA: Insufficient documentation

## 2017-07-23 DIAGNOSIS — M542 Cervicalgia: Secondary | ICD-10-CM | POA: Insufficient documentation

## 2017-07-23 DIAGNOSIS — C9190 Lymphoid leukemia, unspecified not having achieved remission: Secondary | ICD-10-CM | POA: Diagnosis not present

## 2017-07-23 DIAGNOSIS — E785 Hyperlipidemia, unspecified: Secondary | ICD-10-CM | POA: Diagnosis not present

## 2017-07-23 DIAGNOSIS — Z7189 Other specified counseling: Secondary | ICD-10-CM

## 2017-07-23 DIAGNOSIS — R530 Neoplastic (malignant) related fatigue: Secondary | ICD-10-CM

## 2017-07-23 LAB — IRON AND TIBC
Iron: 67 ug/dL (ref 45–182)
Saturation Ratios: 31 % (ref 17.9–39.5)
TIBC: 219 ug/dL — AB (ref 250–450)
UIBC: 152 ug/dL

## 2017-07-23 LAB — FERRITIN: FERRITIN: 274 ng/mL (ref 24–336)

## 2017-07-23 MED ORDER — IBRUTINIB 420 MG PO TABS: 420.0000 mg | ORAL_TABLET | Freq: Every day | ORAL | 0 refills | Status: DC

## 2017-07-23 NOTE — Progress Notes (Addendum)
START ON PATHWAY REGIMEN - Lymphoma and CLL     A cycle is every 28 days:     Ibrutinib   **Always confirm dose/schedule in your pharmacy ordering system**    Patient Characteristics: Chronic Lymphocytic Leukemia (CLL), First Line, Treatment Indicated, 17p del (+) Disease Type: Chronic Lymphocytic Leukemia (CLL) Disease Type: Not Applicable Line of therapy: First Line RAI Stage: I Treatment Indicated? Treatment Indicated 17p Deletion Status: Positive Intent of Therapy: Non-Curative / Palliative Intent, Discussed with Patient

## 2017-07-23 NOTE — Progress Notes (Addendum)
[place] [facility] day:  07/23/17    Chief Complaint: [name] is a 66 y.o. 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  HPI:  The patient was last seen in the medical onocology clinic on 12/25/2016. Patient reports feeling profound fatigue, lack of appetite, weight loss. He weight 128 pounds today, comparing to 137 pounds when he was here in January 2018.  He met with [name], registered dietitian on 01/04/2017. Patient used to follow up with Dr.[name].  He missed his  follow-up with Dr. Mike Gip.   Treatment history: He originally presented with hemolytic anemia in 01/2014.  He was treated with prednisone. With taper of prednisone, his hemolysis returned.I think it's only positives are Per Dr.[name]'s note, patient received 4 weekly cycles of Rituxan (10/02/2014- 10/23/2014).   INTERVAL HISTORY Today patient presents to clinic to discuss his most recent CT scan and the further treatment plan. Patient continues to feel profoundly fatigued, lack of appetite. Denies nausea or vomiting, abdominal pain, loose stools, fever or chills. He denies any blood in the stool, or change of bowel habit. He reports that he often eats pretty good breakfast, however he doesn't have much appetite for lunch or dinner and sometimes he eats fruits instead of formal meal. The fatigue has been ongoing for the past 2 years. With further questioning about how he felt after his initial rituximab treatment in 2015, he reports feeling energy was improved as well as improved appetite at that time. He was able to keep his weight stable at that time as well. He is very concerned about his weight loss.   Past Medical History:  Diagnosis Date  . Anemia 2008  . Arthritis   . Chronic lymphocytic leukemia (CLL), B-cell ([place]) 06/15/2015  . Hyperlipidemia   . Peripheral vascular disease [facility])     Past Surgical History:   Procedure Laterality Date  . COLONOSCOPY    . COLONOSCOPY WITH PROPOFOL N/A 07/23/2015   Procedure: COLONOSCOPY WITH PROPOFOL;  Surgeon: [name], MD;  Location: [facility] ENDOSCOPY;  Service: Endoscopy;  Laterality: N/A;  . ESOPHAGOGASTRODUODENOSCOPY (EGD) WITH PROPOFOL N/A 08/20/2015   Procedure: ESOPHAGOGASTRODUODENOSCOPY (EGD) WITH PROPOFOL;  Surgeon: [name], MD;  Location: [facility] ENDOSCOPY;  Service: Endoscopy;  Laterality: N/A;  . lymp node removal Right 2011   arm   left arm 2015  . LYMPH NODE BIOPSY  08/2014    Family History  Problem Relation Age of Onset  . Kidney cancer Neg Hx   . Bladder Cancer Neg Hx   . Prostate cancer Neg Hx     Social History:  reports that he has been smoking Cigars.  He has never used smokeless tobacco. He reports that he does not drink alcohol or use drugs.  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  Allergies:  Allergies  Allergen Reactions  . Latex Rash    Current Medications: Ibuprofen PRN Review of Systems:  Review of Systems  Constitutional: Positive for malaise/fatigue and weight loss.  HENT: Negative.   Eyes: Negative.   Respiratory: Negative.   Cardiovascular: Negative.   Gastrointestinal: Negative.   Genitourinary: Negative.   Musculoskeletal: Negative.   Skin: Negative.   Neurological: Positive for weakness.  Endo/Heme/Allergies: Negative.   Psychiatric/Behavioral: Negative.    Physical Exam: Blood pressure 102/65, pulse (!) 54, temperature (!) 97.5 F (  36.4 C), temperature source Temporal, resp. rate 20, weight 134 lb 11.2 oz (61.1 kg). GENERAL:  no acute distress. MENTAL STATUS:  Alert and oriented to person, place and time. HEAD:   Alopecia.    Normocephalic, atraumatic, face symmetric, no Cushingoid features. EYES:  Pupils equal round and reactive to light and accomodation.  No conjunctivitis or scleral icterus. ENT:  Oropharynx clear  without lesion.  Dentures.  Tongue normal. Mucous membranes moist.  RESPIRATORY:  Clear to auscultation without rales, wheezes or rhonchi. CARDIOVASCULAR:  Regular rate and rhythm without murmur, rub or gallop. ABDOMEN:  Soft, non-tender, with active bowel sounds, and no hepatosplenomegaly.  No masses. SKIN:  No rashes, ulcers or lesions. EXTREMITIES: No edema, no skin discoloration or tenderness.  No palpable cords. LYMPH NODES:  right supraclavicular small mobile lymph node. Axillary lymphadenopathy bilateral,The largest one on the right is about 2cm. NEUROLOGICAL: Unremarkable. PSYCH:  Appropriate.  LABORATORY RESULTS. CBC    Component Value Date/Time   WBC 26.4 (H) 07/20/2017 1118   RBC 5.21 07/20/2017 1118   HGB 12.4 (L) 07/20/2017 1118   HGB 13.0 03/13/2015 0852   HCT 38.7 (L) 07/20/2017 1118   HCT 40.9 03/13/2015 0852   PLT 221 07/20/2017 1118   PLT 217 03/13/2015 0852   MCV 74.3 (L) 07/20/2017 1118   MCV 78 (L) 03/13/2015 0852   MCH 23.8 (L) 07/20/2017 1118   MCHC 32.0 07/20/2017 1118   RDW 15.8 (H) 07/20/2017 1118   RDW 16.0 (H) 03/13/2015 0852   LYMPHSABS 20.8 (H) 07/20/2017 1118   LYMPHSABS 3.9 (H) 03/13/2015 0852   MONOABS 0.6 07/20/2017 1118   MONOABS 0.4 03/13/2015 0852   EOSABS 0.3 07/20/2017 1118   EOSABS 0.4 03/13/2015 0852   BASOSABS 0.1 07/20/2017 1118   BASOSABS 0.1 03/13/2015 0852   CMP     Component Value Date/Time   NA 136 07/20/2017 1118   NA 136 03/13/2015 0852   K 4.1 07/20/2017 1118   K 3.9 03/13/2015 0852   CL 105 07/20/2017 1118   CL 104 03/13/2015 0852   CO2 27 07/20/2017 1118   CO2 26 03/13/2015 0852   GLUCOSE 135 (H) 07/20/2017 1118   GLUCOSE 96 03/13/2015 0852   BUN 12 07/20/2017 1118   BUN 11 03/13/2015 0852   CREATININE 0.99 07/20/2017 1118   CREATININE 1.09 03/13/2015 0852   CALCIUM 9.5 07/20/2017 1118   CALCIUM 9.3 03/13/2015 0852   PROT 7.2 07/20/2017 1118   PROT 8.1 03/13/2015 0852   ALBUMIN 4.1 07/20/2017 1118    ALBUMIN 4.5 03/13/2015 0852   AST 21 07/20/2017 1118   AST 26 03/13/2015 0852   ALT 13 (L) 07/20/2017 1118   ALT 12 (L) 03/13/2015 0852   ALKPHOS 46 07/20/2017 1118   ALKPHOS 57 03/13/2015 0852   BILITOT 0.7 07/20/2017 1118   BILITOT 1.2 03/13/2015 0852   GFRNONAA >60 07/20/2017 1118   GFRNONAA >60 03/13/2015 0852   GFRAA >60 07/20/2017 1118   GFRAA >60 03/13/2015 0852   PATHOLOGY:  11/02/2016 Peripheral blood FISH studies revealed 20% of nuclei positive for loss of 1 ATM signal, 63% of nuclei positive for trisomy 12, and 32% of nuclei positive for loss of 1 TP53 signal. Results for CCND1/IGH and 13q were normal.  09/13/2014 Left axillary node biopsy on  confirmed B-cell small lymphocytic lymphoma (B-CLL/SLL)   IMAGE STUDIES  02/09/2014 Chest and abdomen CT scan  revealed bulky bilateral axillary adenopathy and bulky intraperitoneal and retroperitoneal adenopathy. 07/11/2014 PET scan  revealed mildly hypermetabolic lymph nodes in the chest, abdomen, and pelvis and mildly hypermetabolic spleen.  06/24/2016 PET scan  revealed bilateral moderate hypermetabolic axillary lymphadenopathy increased in size (1.6 cm compared to 1.3 cm) and metabolism (SUV 3.9 compared to 3.6).  There were bilateral moderate hypermetabolic external iliac lymphadenopathy stable to decreased in metabolism.  There was mesenteric and retroperitoneal lymphadenopathy stable in size and non hypermetabolic.  There were no new sites of hypermetabolic lymphoma.  There were subcentimeter pulmonary nodules stable and below PET resolution. 07/21/2017 CT chest abdomen pelvis w contrast:  Mild interval increase in bulky mesenteric lymphadenopathy since prior exam. Stable lymphadenopathy in bilateral axillary regions, abdominal retroperitoneum, and bilateral external iliac chains.  PREVIOUS ENDOSCOPY He has slowly progressive microcytic anemia.  Colonoscopy on 07/23/2015 revealed diverticulosis in the sigmoid colon.  EGD on  08/20/2015 was normal. Guaiac cards x 3 were negative in 06/2015.  His diet fluctuates with his appetite.  He denies any melana or hematochezia.   Assessment:  [name] is a 66 y.o. male with B-cell small lymphocytic lymphoma (B-cell CLL/SLL) initially presented with hemolytic anemia in 01/2014, s/p treatment with steroid and Rituximab, now with significant weight loss, lack of appetite and fatigue.   1. CLL (chronic lymphocytic leukemia) ([place])   2. Weight loss   3. Microcytic anemia   4. Neoplastic malignant related fatigue   5. Goals of care, counseling/discussion    Patient does have severe constitutional symptoms, including profound weight loss, lack of appetite and fatigue.   Plan: # Lab results and CT results were discussed with patient. It appears that his leukocytosis has increased since 6 months ago, however not doubled. CT scan showed mild regression of mesenteric lymphadenopathy. If he had not has these constitutional symptoms, I would have recommended to closely observe him. Since he has severe constitutional symptoms, including profound weight loss,  and lack of appetite fatigue,  and weakness, I would recommend starting treatment for CLL.  GI etiology for weight loss was previously explored and findings revealed no clear explanation of his symptoms.  # As he has lost signal of TP 53 on FISH from peripheral blood, the preferred regimen for him will be Ibrutinib,  # Order iron/TIBC, ferritin to further evaluate his microcytic anemia. Occult stool card 3. Hemoglobinopathy evaluation.  I discussed in length with patient the rationale of starting treatment, side effects of Ibrutinib were discussed with patient in details, patient voices understanding and is willing tbecauseo proceed treatment. Send for approval from her insurance, and will sign him up for chemotherapy class.   Follow up with me in 3 weeks.  Total face to face encounter time for this patient visit was 40 min. >50%  of the time was  spent in counseling and coordination of care.     Earlie Server, MD  07/23/2017, 1:38 PM

## 2017-07-24 LAB — HEPATITIS PANEL, ACUTE
HCV Ab: 0.1 s/co ratio (ref 0.0–0.9)
HEP A IGM: NEGATIVE
Hep B C IgM: NEGATIVE
Hepatitis B Surface Ag: NEGATIVE

## 2017-07-24 LAB — HIV ANTIBODY (ROUTINE TESTING W REFLEX): HIV Screen 4th Generation wRfx: NONREACTIVE

## 2017-07-26 ENCOUNTER — Telehealth: Payer: Self-pay

## 2017-07-26 NOTE — Telephone Encounter (Signed)
[facility] called to notify clinic that they are unable to reach patient regarding the next shipment of Ibrutinib.  Contact information on file verified.  Clinic attempted to contact patient on number provided as well without success.  MD notified.

## 2017-07-27 DIAGNOSIS — M542 Cervicalgia: Secondary | ICD-10-CM | POA: Diagnosis not present

## 2017-07-27 DIAGNOSIS — Z79899 Other long term (current) drug therapy: Secondary | ICD-10-CM | POA: Diagnosis not present

## 2017-07-27 DIAGNOSIS — I739 Peripheral vascular disease, unspecified: Secondary | ICD-10-CM | POA: Diagnosis not present

## 2017-07-27 DIAGNOSIS — E785 Hyperlipidemia, unspecified: Secondary | ICD-10-CM | POA: Diagnosis not present

## 2017-07-27 DIAGNOSIS — D589 Hereditary hemolytic anemia, unspecified: Secondary | ICD-10-CM | POA: Diagnosis not present

## 2017-07-27 DIAGNOSIS — C9190 Lymphoid leukemia, unspecified not having achieved remission: Secondary | ICD-10-CM | POA: Diagnosis not present

## 2017-07-27 DIAGNOSIS — R634 Abnormal weight loss: Secondary | ICD-10-CM | POA: Diagnosis not present

## 2017-07-27 DIAGNOSIS — D509 Iron deficiency anemia, unspecified: Secondary | ICD-10-CM | POA: Diagnosis not present

## 2017-07-27 DIAGNOSIS — R5383 Other fatigue: Secondary | ICD-10-CM | POA: Diagnosis not present

## 2017-07-27 DIAGNOSIS — R197 Diarrhea, unspecified: Secondary | ICD-10-CM | POA: Diagnosis not present

## 2017-07-27 DIAGNOSIS — F17210 Nicotine dependence, cigarettes, uncomplicated: Secondary | ICD-10-CM | POA: Diagnosis not present

## 2017-07-27 DIAGNOSIS — D561 Beta thalassemia: Secondary | ICD-10-CM | POA: Diagnosis not present

## 2017-07-27 LAB — HEMOGLOBINOPATHY EVALUATION
HGB A2 QUANT: 6.1 % — AB (ref 1.8–3.2)
HGB F QUANT: 0 % (ref 0.0–2.0)
Hgb A: 93.9 % — ABNORMAL LOW (ref 96.4–98.8)
Hgb C: 0 %
Hgb S Quant: 0 %
Hgb Variant: 0 %

## 2017-07-27 LAB — CELIAC PANEL 10
ENDOMYSIAL ANTIBODY IGA: NEGATIVE
GLIADIN IGA: 8 U (ref 0–19)
GLIADIN IGG: 3 U (ref 0–19)
IGA: 280 mg/dL (ref 61–437)

## 2017-07-29 DIAGNOSIS — R634 Abnormal weight loss: Secondary | ICD-10-CM | POA: Diagnosis not present

## 2017-07-29 DIAGNOSIS — E785 Hyperlipidemia, unspecified: Secondary | ICD-10-CM | POA: Diagnosis not present

## 2017-07-29 DIAGNOSIS — C9190 Lymphoid leukemia, unspecified not having achieved remission: Secondary | ICD-10-CM | POA: Diagnosis not present

## 2017-07-29 DIAGNOSIS — I739 Peripheral vascular disease, unspecified: Secondary | ICD-10-CM | POA: Diagnosis not present

## 2017-07-29 DIAGNOSIS — D589 Hereditary hemolytic anemia, unspecified: Secondary | ICD-10-CM | POA: Diagnosis not present

## 2017-07-29 DIAGNOSIS — R197 Diarrhea, unspecified: Secondary | ICD-10-CM | POA: Diagnosis not present

## 2017-07-29 DIAGNOSIS — F17210 Nicotine dependence, cigarettes, uncomplicated: Secondary | ICD-10-CM | POA: Diagnosis not present

## 2017-07-29 DIAGNOSIS — Z79899 Other long term (current) drug therapy: Secondary | ICD-10-CM | POA: Diagnosis not present

## 2017-07-29 DIAGNOSIS — M542 Cervicalgia: Secondary | ICD-10-CM | POA: Diagnosis not present

## 2017-07-29 DIAGNOSIS — D509 Iron deficiency anemia, unspecified: Secondary | ICD-10-CM | POA: Diagnosis not present

## 2017-07-29 DIAGNOSIS — R5383 Other fatigue: Secondary | ICD-10-CM | POA: Diagnosis not present

## 2017-07-29 DIAGNOSIS — D561 Beta thalassemia: Secondary | ICD-10-CM | POA: Diagnosis not present

## 2017-07-30 DIAGNOSIS — D509 Iron deficiency anemia, unspecified: Secondary | ICD-10-CM | POA: Diagnosis not present

## 2017-07-30 DIAGNOSIS — D589 Hereditary hemolytic anemia, unspecified: Secondary | ICD-10-CM | POA: Diagnosis not present

## 2017-07-30 DIAGNOSIS — R634 Abnormal weight loss: Secondary | ICD-10-CM | POA: Diagnosis not present

## 2017-07-30 DIAGNOSIS — C9190 Lymphoid leukemia, unspecified not having achieved remission: Secondary | ICD-10-CM | POA: Diagnosis not present

## 2017-07-30 DIAGNOSIS — Z79899 Other long term (current) drug therapy: Secondary | ICD-10-CM | POA: Diagnosis not present

## 2017-07-30 DIAGNOSIS — I739 Peripheral vascular disease, unspecified: Secondary | ICD-10-CM | POA: Diagnosis not present

## 2017-07-30 DIAGNOSIS — F17210 Nicotine dependence, cigarettes, uncomplicated: Secondary | ICD-10-CM | POA: Diagnosis not present

## 2017-07-30 DIAGNOSIS — R197 Diarrhea, unspecified: Secondary | ICD-10-CM | POA: Diagnosis not present

## 2017-07-30 DIAGNOSIS — R5383 Other fatigue: Secondary | ICD-10-CM | POA: Diagnosis not present

## 2017-07-30 DIAGNOSIS — D561 Beta thalassemia: Secondary | ICD-10-CM | POA: Diagnosis not present

## 2017-07-30 DIAGNOSIS — M542 Cervicalgia: Secondary | ICD-10-CM | POA: Diagnosis not present

## 2017-07-30 DIAGNOSIS — E785 Hyperlipidemia, unspecified: Secondary | ICD-10-CM | POA: Diagnosis not present

## 2017-08-06 ENCOUNTER — Telehealth: Payer: Self-pay | Admitting: *Deleted

## 2017-08-06 NOTE — Telephone Encounter (Signed)
Spoke with [employer] and told them to call him back and that he needed to answer the call from them, voiced understanding. Geraldine Solar CMA informed of call.

## 2017-08-10 ENCOUNTER — Inpatient Hospital Stay: Payer: BLUE CROSS/BLUE SHIELD

## 2017-08-10 ENCOUNTER — Other Ambulatory Visit: Payer: Self-pay | Admitting: *Deleted

## 2017-08-10 DIAGNOSIS — C9110 Chronic lymphocytic leukemia of B-cell type not having achieved remission: Secondary | ICD-10-CM

## 2017-08-10 DIAGNOSIS — D509 Iron deficiency anemia, unspecified: Secondary | ICD-10-CM

## 2017-08-10 DIAGNOSIS — R634 Abnormal weight loss: Secondary | ICD-10-CM

## 2017-08-10 LAB — OCCULT BLOOD X 1 CARD TO LAB, STOOL
Fecal Occult Bld: NEGATIVE
Fecal Occult Bld: NEGATIVE
Fecal Occult Bld: NEGATIVE

## 2017-08-12 ENCOUNTER — Ambulatory Visit (INDEPENDENT_AMBULATORY_CARE_PROVIDER_SITE_OTHER): Payer: BLUE CROSS/BLUE SHIELD | Admitting: General Surgery

## 2017-08-12 ENCOUNTER — Encounter: Payer: Self-pay | Admitting: General Surgery

## 2017-08-12 ENCOUNTER — Inpatient Hospital Stay (HOSPITAL_BASED_OUTPATIENT_CLINIC_OR_DEPARTMENT_OTHER): Payer: BLUE CROSS/BLUE SHIELD | Admitting: Oncology

## 2017-08-12 ENCOUNTER — Inpatient Hospital Stay: Payer: BLUE CROSS/BLUE SHIELD | Admitting: *Deleted

## 2017-08-12 ENCOUNTER — Other Ambulatory Visit: Payer: Self-pay | Admitting: *Deleted

## 2017-08-12 VITALS — BP 160/73 | HR 48 | Temp 95.2°F | Wt 134.4 lb

## 2017-08-12 VITALS — BP 118/62 | HR 46 | Resp 14 | Ht 68.0 in | Wt 136.0 lb

## 2017-08-12 DIAGNOSIS — Z79899 Other long term (current) drug therapy: Secondary | ICD-10-CM | POA: Diagnosis not present

## 2017-08-12 DIAGNOSIS — F17210 Nicotine dependence, cigarettes, uncomplicated: Secondary | ICD-10-CM | POA: Diagnosis not present

## 2017-08-12 DIAGNOSIS — D589 Hereditary hemolytic anemia, unspecified: Secondary | ICD-10-CM

## 2017-08-12 DIAGNOSIS — M542 Cervicalgia: Secondary | ICD-10-CM

## 2017-08-12 DIAGNOSIS — R5383 Other fatigue: Secondary | ICD-10-CM

## 2017-08-12 DIAGNOSIS — D509 Iron deficiency anemia, unspecified: Secondary | ICD-10-CM

## 2017-08-12 DIAGNOSIS — C9190 Lymphoid leukemia, unspecified not having achieved remission: Secondary | ICD-10-CM

## 2017-08-12 DIAGNOSIS — C9111 Chronic lymphocytic leukemia of B-cell type in remission: Secondary | ICD-10-CM

## 2017-08-12 DIAGNOSIS — I739 Peripheral vascular disease, unspecified: Secondary | ICD-10-CM

## 2017-08-12 DIAGNOSIS — E785 Hyperlipidemia, unspecified: Secondary | ICD-10-CM | POA: Diagnosis not present

## 2017-08-12 DIAGNOSIS — R530 Neoplastic (malignant) related fatigue: Secondary | ICD-10-CM

## 2017-08-12 DIAGNOSIS — D561 Beta thalassemia: Secondary | ICD-10-CM

## 2017-08-12 DIAGNOSIS — R197 Diarrhea, unspecified: Secondary | ICD-10-CM | POA: Diagnosis not present

## 2017-08-12 DIAGNOSIS — C9110 Chronic lymphocytic leukemia of B-cell type not having achieved remission: Secondary | ICD-10-CM

## 2017-08-12 DIAGNOSIS — R634 Abnormal weight loss: Secondary | ICD-10-CM | POA: Diagnosis not present

## 2017-08-12 DIAGNOSIS — M792 Neuralgia and neuritis, unspecified: Secondary | ICD-10-CM

## 2017-08-12 LAB — COMPREHENSIVE METABOLIC PANEL
ALBUMIN: 3.9 g/dL (ref 3.5–5.0)
ALK PHOS: 47 U/L (ref 38–126)
ALT: 13 U/L — AB (ref 17–63)
AST: 21 U/L (ref 15–41)
Anion gap: 6 (ref 5–15)
BILIRUBIN TOTAL: 0.6 mg/dL (ref 0.3–1.2)
BUN: 10 mg/dL (ref 6–20)
CALCIUM: 9 mg/dL (ref 8.9–10.3)
CO2: 26 mmol/L (ref 22–32)
CREATININE: 1.08 mg/dL (ref 0.61–1.24)
Chloride: 106 mmol/L (ref 101–111)
GFR calc non Af Amer: >60 mL/min (ref >60)
GLUCOSE: 101 mg/dL — AB (ref 65–99)
Potassium: 4.3 mmol/L (ref 3.5–5.1)
SODIUM: 138 mmol/L (ref 135–145)
TOTAL PROTEIN: 7.2 g/dL (ref 6.5–8.1)

## 2017-08-12 LAB — CBC WITH DIFFERENTIAL/PLATELET
Basophils Absolute: 0.1 10*3/uL (ref 0–0.1)
Basophils Relative: 0 %
EOS ABS: 0.3 10*3/uL (ref 0–0.7)
Eosinophils Relative: 1 %
HEMATOCRIT: 34.3 % — AB (ref 40.0–52.0)
HEMOGLOBIN: 11.2 g/dL — AB (ref 13.0–18.0)
Lymphocytes Relative: 78 %
Lymphs Abs: 17.4 10*3/uL — ABNORMAL HIGH (ref 1.0–3.6)
MCH: 24.3 pg — AB (ref 26.0–34.0)
MCHC: 32.8 g/dL (ref 32.0–36.0)
MCV: 74.1 fL — ABNORMAL LOW (ref 80.0–100.0)
MONOS PCT: 3 %
Monocytes Absolute: 0.7 10*3/uL (ref 0.2–1.0)
NEUTROS ABS: 4 10*3/uL (ref 1.4–6.5)
NEUTROS PCT: 18 %
Platelets: 214 10*3/uL (ref 150–440)
RBC: 4.63 MIL/uL (ref 4.40–5.90)
RDW: 16.1 % — ABNORMAL HIGH (ref 11.5–14.5)
WBC: 22.5 10*3/uL — AB (ref 3.8–10.6)

## 2017-08-12 MED ORDER — HYDROCODONE-ACETAMINOPHEN 5-325 MG PO TABS: 1.0000 | ORAL_TABLET | Freq: Four times a day (QID) | ORAL | 0 refills | Status: DC | PRN

## 2017-08-12 NOTE — Progress Notes (Signed)
Patient ID: [name], male   DOB: [birth year], 66 y.o.   MRN: [id number]  Chief Complaint  Patient presents with  . Follow-up    HPI [name] is a 66 y.o. male here today for a evalaution of left Neck, shoulder, axillary, chest wall and lower extremity pain. Patient states the pain started Sunday morning and has been severe. Was concerned it was related to his September 2015 lymph node biopsy which confirmed lymphoma. He reports that within hours of initiation the pain extended from the neck down to the lateral aspect of his left leg. This is been constant nature. No improvement with nonsteroidals or local heat.[name]     HPI  Past Medical History:  Diagnosis Date  . Anemia 2008  . Arthritis   . Chronic lymphocytic leukemia (CLL), B-cell ([place]) 06/15/2015  . Hyperlipidemia   . Peripheral vascular disease [facility])     Past Surgical History:  Procedure Laterality Date  . COLONOSCOPY    . COLONOSCOPY WITH PROPOFOL N/A 07/23/2015   Procedure: COLONOSCOPY WITH PROPOFOL;  Surgeon: [name], MD;  Location: [facility] ENDOSCOPY;  Service: Endoscopy;  Laterality: N/A;  . ESOPHAGOGASTRODUODENOSCOPY (EGD) WITH PROPOFOL N/A 08/20/2015   Procedure: ESOPHAGOGASTRODUODENOSCOPY (EGD) WITH PROPOFOL;  Surgeon: [name], MD;  Location: [facility] ENDOSCOPY;  Service: Endoscopy;  Laterality: N/A;  . lymp node removal Right 2011   arm   left arm 2015  . LYMPH NODE BIOPSY  08/2014    Family History  Problem Relation Age of Onset  . Kidney cancer Neg Hx   . Bladder Cancer Neg Hx   . Prostate cancer Neg Hx     Social History Social History  Substance Use Topics  . Smoking status: Current Some Day Smoker    Types: Cigars  . Smokeless tobacco: Never Used  . Alcohol use No    Allergies  Allergen Reactions  . Latex Rash    Current Outpatient Prescriptions  Medication Sig Dispense Refill  . MYRBETRIQ 25 MG TB24 tablet   0  . HYDROcodone-acetaminophen (NORCO) 5-325 MG tablet Take 1-2 tablets  by mouth every 6 (six) hours as needed for moderate pain. 30 tablet 0  . Ibrutinib 420 MG TABS Take 420 mg by mouth daily. 30 tablet 0   No current facility-administered medications for this visit.     Review of Systems Review of Systems  Constitutional: Negative.   Respiratory: Negative.   Cardiovascular: Negative.     Blood pressure 118/62, pulse (!) 46, resp. rate 14, height 5\' 8"  (1.727 m), weight 136 lb (61.7 kg).  Physical Exam Physical Exam  Constitutional: He is oriented to person, place, and time. He appears well-developed and well-nourished.  Eyes: Conjunctivae are normal. No scleral icterus.  Neck: Neck supple.  Cardiovascular: Normal rate, regular rhythm and normal heart sounds.   Pulmonary/Chest: Effort normal and breath sounds normal.    Abdominal: Soft. Normal appearance and bowel sounds are normal. There is no hepatomegaly. There is no tenderness. No hernia.  Lymphadenopathy:    He has no cervical adenopathy.  2cm left axilla lymph node and 3 cm lymph node on right axilla.  Neurological: He is alert and oriented to person, place, and time. He has normal strength. No cranial nerve deficit. Gait normal.  Motor strength testing was 5 over 5+ in all major muscle groups of the upper and lower extremities.  Speech pattern normal.  Skin: Skin is warm and dry.    Data Reviewed CT of the chest abdomen  and pelvis dated 07/21/2017 was reviewed. IMPRESSION: Mild interval increase in bulky mesenteric lymphadenopathy since prior exam.  Stable lymphadenopathy in bilateral axillary regions, abdominal retroperitoneum, and bilateral external iliac chains.  Most recent white blood cell count 26,400 with 80% lymphocytes, hemoglobin 12.4, stable low MCV at 74.3.  Assessment    Unexplained sudden pain involving the left side of the body from the base of the skull to the knee without motor deficit or other evidence of an intracranial process.    Plan    I spoke with the  medical oncology service and I agreed to see the patient this afternoon at 3 PM.  At this time, no etiology for his pain is identified.     Patient to see DR. YU in [place] at 3:00 pm today. The patient is aware to call back for any questions or concerns.   HPI, Physical Exam, Assessment and Plan have been scribed under the direction and in the presence of Hervey Ard, MD.  Gaspar Cola, CMA  I have completed the exam and reviewed the above documentation for accuracy and completeness.  I agree with the above.  Haematologist has been used and any errors in dictation or transcription are unintentional.  Hervey Ard, M.D., F.A.C.S.   [name] 08/13/2017, 7:41 AM

## 2017-08-12 NOTE — Progress Notes (Signed)
Patient c/o new onset of aching pain back of his neck down his arms through legs, describes as an aching pain.

## 2017-08-12 NOTE — Patient Instructions (Addendum)
Patient to see DR. YU in [place] at 3:00 pm today. The patient is aware to call back for any questions or concerns.

## 2017-08-13 ENCOUNTER — Inpatient Hospital Stay: Payer: BLUE CROSS/BLUE SHIELD | Admitting: Oncology

## 2017-08-14 ENCOUNTER — Encounter: Payer: Self-pay | Admitting: Oncology

## 2017-08-14 NOTE — Progress Notes (Signed)
[facility] day:  08/14/17    Chief Complaint: [name] is a 66 y.o. 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  HPI:  The patient was last seen in the medical onocology clinic on 12/25/2016. Patient reports feeling profound fatigue, lack of appetite, weight loss. He weight 128 pounds today, comparing to 137 pounds when he was here in January 2018.  He met with [name], registered dietitian on 01/04/2017. Patient used to follow up with Dr.[name].  He missed his  follow-up with Dr. Mike Gip.   Treatment history: He originally presented with hemolytic anemia in 01/2014.  He was treated with prednisone. With taper of prednisone, his hemolysis returned.I think it's only positives are Per Dr.[name]'s note, patient received 4 weekly cycles of Rituxan (10/02/2014- 10/23/2014).   07/23/2017 Decision was made to start Ibrutinib as patient is very symptomatic (fatigue and weight loss, lack of appetite). Patient did not show up at the chemo education class.   INTERVAL HISTORY Patient went to see Surgeon Dr. Bary Castilla complaining about left neck, shoulder and axillary area pain. He reports that within hours of initiation the pain extended from the neck down to the lateral aspect of his left leg. Examination was not remarkable and his pain is not explained.  I spoke with Dr.Byrnett over the phone and moved his appointment from tomorrow to today.  Patient tells me that his pain started suddenly, after waking up from sleep. And pain extended from the neck down to the left arm/shoulder. He has tried OTC non steroids with no improvement.  He denies fever chills, SOB, chest pain. Abdominal pain. His appetite is good and he continues to have chronic diarrhea. His weight is stable comparing to one month ago.  Patient tells me that he used to be a heavy drinker but does not drink any more. He has received  ibrutinib and has not started as he missed the chemo class.   Past Medical History:  Diagnosis Date  . Anemia 2008  . Arthritis   . Chronic lymphocytic leukemia (CLL), B-cell ([place]) 06/15/2015  . Hyperlipidemia   . Peripheral vascular disease [facility])     Past Surgical History:  Procedure Laterality Date  . COLONOSCOPY    . COLONOSCOPY WITH PROPOFOL N/A 07/23/2015   Procedure: COLONOSCOPY WITH PROPOFOL;  Surgeon: [name], MD;  Location: [facility] ENDOSCOPY;  Service: Endoscopy;  Laterality: N/A;  . ESOPHAGOGASTRODUODENOSCOPY (EGD) WITH PROPOFOL N/A 08/20/2015   Procedure: ESOPHAGOGASTRODUODENOSCOPY (EGD) WITH PROPOFOL;  Surgeon: [name], MD;  Location: [facility] ENDOSCOPY;  Service: Endoscopy;  Laterality: N/A;  . lymp node removal Right 2011   arm   left arm 2015  . LYMPH NODE BIOPSY  08/2014    Family History  Problem Relation Age of Onset  . Kidney cancer Neg Hx   . Bladder Cancer Neg Hx   . Prostate cancer Neg Hx     Social History:  reports that he has been smoking Cigars.  He has never used smokeless tobacco. He reports that he does not drink alcohol or use drugs.  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  Allergies:  Allergies  Allergen Reactions  . Latex Rash    Current Medications: Ibuprofen PRN Review of Systems:  Review of Systems  Constitutional: Positive for malaise/fatigue and weight loss.  HENT: Negative.  Eyes: Negative.   Respiratory: Negative.   Cardiovascular: Negative.   Gastrointestinal: Negative.   Genitourinary: Negative.   Musculoskeletal: Negative.   Skin: Negative.   Neurological: Positive for weakness.  Endo/Heme/Allergies: Negative.   Psychiatric/Behavioral: Negative.    Physical Exam: Blood pressure (!) 160/73, pulse (!) 48, temperature (!) 95.2 F (35.1 C), temperature source Tympanic, weight 134 lb 7 oz (61 kg). GENERAL:  no acute distress. MENTAL  STATUS:  Alert and oriented to person, place and time. HEAD:   Alopecia.    Normocephalic, atraumatic, face symmetric, no Cushingoid features. EYES:  Pupils equal round and reactive to light and accomodation.  No conjunctivitis or scleral icterus. ENT:  Oropharynx clear without lesion.  Dentures.  Tongue normal. Mucous membranes moist.  RESPIRATORY:  Clear to auscultation without rales, wheezes or rhonchi. CARDIOVASCULAR:  Regular rate and rhythm without murmur, rub or gallop. ABDOMEN:  Soft, non-tender, with active bowel sounds, and no hepatosplenomegaly.  No masses. SKIN:  No rashes, ulcers or lesions. EXTREMITIES: No edema, no skin discoloration or tenderness.  No palpable cords. LYMPH NODES:  right supraclavicular small mobile lymph node. Axillary lymphadenopathy bilateral,The largest one on the right is about 2cm. NEUROLOGICAL: Unremarkable. PSYCH:  Appropriate.  LABORATORY RESULTS. CBC    Component Value Date/Time   WBC 22.5 (H) 08/12/2017 1446   RBC 4.63 08/12/2017 1446   HGB 11.2 (L) 08/12/2017 1446   HGB 13.0 03/13/2015 0852   HCT 34.3 (L) 08/12/2017 1446   HCT 40.9 03/13/2015 0852   PLT 214 08/12/2017 1446   PLT 217 03/13/2015 0852   MCV 74.1 (L) 08/12/2017 1446   MCV 78 (L) 03/13/2015 0852   MCH 24.3 (L) 08/12/2017 1446   MCHC 32.8 08/12/2017 1446   RDW 16.1 (H) 08/12/2017 1446   RDW 16.0 (H) 03/13/2015 0852   LYMPHSABS 17.4 (H) 08/12/2017 1446   LYMPHSABS 3.9 (H) 03/13/2015 0852   MONOABS 0.7 08/12/2017 1446   MONOABS 0.4 03/13/2015 0852   EOSABS 0.3 08/12/2017 1446   EOSABS 0.4 03/13/2015 0852   BASOSABS 0.1 08/12/2017 1446   BASOSABS 0.1 03/13/2015 0852   CMP     Component Value Date/Time   NA 138 08/12/2017 1446   NA 136 03/13/2015 0852   K 4.3 08/12/2017 1446   K 3.9 03/13/2015 0852   CL 106 08/12/2017 1446   CL 104 03/13/2015 0852   CO2 26 08/12/2017 1446   CO2 26 03/13/2015 0852   GLUCOSE 101 (H) 08/12/2017 1446   GLUCOSE 96 03/13/2015 0852   BUN  10 08/12/2017 1446   BUN 11 03/13/2015 0852   CREATININE 1.08 08/12/2017 1446   CREATININE 1.09 03/13/2015 0852   CALCIUM 9.0 08/12/2017 1446   CALCIUM 9.3 03/13/2015 0852   PROT 7.2 08/12/2017 1446   PROT 8.1 03/13/2015 0852   ALBUMIN 3.9 08/12/2017 1446   ALBUMIN 4.5 03/13/2015 0852   AST 21 08/12/2017 1446   AST 26 03/13/2015 0852   ALT 13 (L) 08/12/2017 1446   ALT 12 (L) 03/13/2015 0852   ALKPHOS 47 08/12/2017 1446   ALKPHOS 57 03/13/2015 0852   BILITOT 0.6 08/12/2017 1446   BILITOT 1.2 03/13/2015 0852   GFRNONAA >60 08/12/2017 1446   GFRNONAA >60 03/13/2015 0852   GFRAA >60 08/12/2017 1446   GFRAA >60 03/13/2015 0852   PATHOLOGY:  11/02/2016 Peripheral blood FISH studies revealed 20% of nuclei positive for loss of 1 ATM signal, 63% of nuclei positive for trisomy 12, and 32% of nuclei positive for  loss of 1 TP53 signal. Results for CCND1/IGH and 13q were normal.  09/13/2014 Left axillary node biopsy on  confirmed B-cell small lymphocytic lymphoma (B-CLL/SLL)   IMAGE STUDIES  02/09/2014 Chest and abdomen CT scan  revealed bulky bilateral axillary adenopathy and bulky intraperitoneal and retroperitoneal adenopathy. 07/11/2014 PET scan  revealed mildly hypermetabolic lymph nodes in the chest, abdomen, and pelvis and mildly hypermetabolic spleen.  06/24/2016 PET scan  revealed bilateral moderate hypermetabolic axillary lymphadenopathy increased in size (1.6 cm compared to 1.3 cm) and metabolism (SUV 3.9 compared to 3.6).  There were bilateral moderate hypermetabolic external iliac lymphadenopathy stable to decreased in metabolism.  There was mesenteric and retroperitoneal lymphadenopathy stable in size and non hypermetabolic.  There were no new sites of hypermetabolic lymphoma.  There were subcentimeter pulmonary nodules stable and below PET resolution. 07/21/2017 CT chest abdomen pelvis w contrast:  Mild interval increase in bulky mesenteric lymphadenopathy since prior exam. Stable  lymphadenopathy in bilateral axillary regions, abdominal retroperitoneum, and bilateral external iliac chains.  PREVIOUS ENDOSCOPY He has slowly progressive microcytic anemia.  Colonoscopy on 07/23/2015 revealed diverticulosis in the sigmoid colon.  EGD on 08/20/2015 was normal. Guaiac cards x 3 were negative in 06/2015.  His diet fluctuates with his appetite.  He denies any melana or hematochezia.   Assessment:  [name] is a 66 y.o. male with B-cell small lymphocytic lymphoma (B-cell CLL/SLL) initially presented with hemolytic anemia in 01/2014, s/p treatment with steroid and Rituximab, now with significant weight loss, lack of appetite and fatigue.   1. CLL (chronic lymphocytic leukemia) ([place])   2. Diarrhea, unspecified type   3. Microcytic anemia   4. Weight loss   5. Neoplastic malignant related fatigue   6. Neck pain on left side    Patient does have severe constitutional symptoms, including profound weight loss, lack of appetite and fatigue.   Plan: # Lab results and CT results were discussed with patient previously. It appears that his leukocytosis wax and wane, however not doubled. CT scan showed mild regression of mesenteric lymphadenopathy. If he had not has these constitutional symptoms, I would have recommended to closely observe him. I recommended him to start Ibrutinib for treatment during las visit and he has not started the treatment.   Today he appears feel well except left neck/shoulder/pain. Examination not remarkably different comparing to last visit.  Likely musculoskeletal, muscle straining. Pain control and I will see him in a week to see if pain persist or not.   His weight has been stable during the interval, and WBC wax and wanes.  He did have chronic diarrhea. I suspect he may have pancreatic insufficiency from previous drinking history. Will check fecal fat, pancreatic elastase, and serum trypsin.   I suggests him not to start Ibrutinib and have GI work up to  see if his weight loss is due to other etiology. Patient agrees.   I etiology for weight loss was previously explored and findings revealed no clear explanation of his symptoms.   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  Follow up with me in 1 weeks.  Total face to face encounter time for this patient visit was 40 min. >50% of the time was  spent in counseling and coordination of care.     Earlie Server, MD  08/14/2017, 2:36 PM

## 2017-08-16 ENCOUNTER — Other Ambulatory Visit: Payer: Self-pay | Admitting: *Deleted

## 2017-08-19 ENCOUNTER — Encounter: Payer: Self-pay | Admitting: Oncology

## 2017-08-19 ENCOUNTER — Inpatient Hospital Stay (HOSPITAL_BASED_OUTPATIENT_CLINIC_OR_DEPARTMENT_OTHER): Payer: BLUE CROSS/BLUE SHIELD | Admitting: Oncology

## 2017-08-19 ENCOUNTER — Inpatient Hospital Stay: Payer: BLUE CROSS/BLUE SHIELD

## 2017-08-19 VITALS — BP 115/65 | HR 42 | Temp 95.1°F | Resp 16 | Wt 130.5 lb

## 2017-08-19 DIAGNOSIS — R5383 Other fatigue: Secondary | ICD-10-CM | POA: Diagnosis not present

## 2017-08-19 DIAGNOSIS — D509 Iron deficiency anemia, unspecified: Secondary | ICD-10-CM

## 2017-08-19 DIAGNOSIS — Z7189 Other specified counseling: Secondary | ICD-10-CM

## 2017-08-19 DIAGNOSIS — Z79899 Other long term (current) drug therapy: Secondary | ICD-10-CM | POA: Diagnosis not present

## 2017-08-19 DIAGNOSIS — M542 Cervicalgia: Secondary | ICD-10-CM | POA: Diagnosis not present

## 2017-08-19 DIAGNOSIS — D561 Beta thalassemia: Secondary | ICD-10-CM | POA: Diagnosis not present

## 2017-08-19 DIAGNOSIS — D563 Thalassemia minor: Secondary | ICD-10-CM

## 2017-08-19 DIAGNOSIS — I739 Peripheral vascular disease, unspecified: Secondary | ICD-10-CM | POA: Diagnosis not present

## 2017-08-19 DIAGNOSIS — C9190 Lymphoid leukemia, unspecified not having achieved remission: Secondary | ICD-10-CM | POA: Diagnosis not present

## 2017-08-19 DIAGNOSIS — R634 Abnormal weight loss: Secondary | ICD-10-CM

## 2017-08-19 DIAGNOSIS — C9110 Chronic lymphocytic leukemia of B-cell type not having achieved remission: Secondary | ICD-10-CM

## 2017-08-19 DIAGNOSIS — D589 Hereditary hemolytic anemia, unspecified: Secondary | ICD-10-CM

## 2017-08-19 DIAGNOSIS — R197 Diarrhea, unspecified: Secondary | ICD-10-CM | POA: Diagnosis not present

## 2017-08-19 DIAGNOSIS — E785 Hyperlipidemia, unspecified: Secondary | ICD-10-CM | POA: Diagnosis not present

## 2017-08-19 DIAGNOSIS — F17210 Nicotine dependence, cigarettes, uncomplicated: Secondary | ICD-10-CM

## 2017-08-19 DIAGNOSIS — C9111 Chronic lymphocytic leukemia of B-cell type in remission: Secondary | ICD-10-CM

## 2017-08-19 MED ORDER — CYCLOBENZAPRINE HCL 5 MG PO TABS: 5.0000 mg | ORAL_TABLET | Freq: Three times a day (TID) | ORAL | 0 refills | Status: DC | PRN

## 2017-08-19 NOTE — Progress Notes (Signed)
[facility] day:  08/19/17    Chief Complaint: [name] is a 66 y.o. 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  HPI:  The patient was last seen in the medical onocology clinic on 12/25/2016. Patient reports feeling profound fatigue, lack of appetite, weight loss. He weight 128 pounds today, comparing to 137 pounds when he was here in January 2018.  He met with [name], registered dietitian on 01/04/2017. Patient used to follow up with Dr.[name].  He missed his  follow-up with Dr. Mike Gip.   Treatment history: He originally presented with hemolytic anemia in 01/2014.  He was treated with prednisone. With taper of prednisone, his hemolysis returned.I think it's only positives are Per Dr.[name]'s note, patient received 4 weekly cycles of Rituxan (10/02/2014- 10/23/2014).   07/23/2017 Decision was made to start Ibrutinib as patient is very symptomatic (fatigue and weight loss, lack of appetite). Patient did not show up at the chemo education class.   INTERVAL HISTORY Today patient is accompanied by his wife for follow-up. He denies fever chills, SOB, chest pain. Abdominal pain. His appetite is good and he continues to have chronic diarrhea. his weight fluctuated frequently. He had left-sided neck pain for which he has been taking pain medication which relieves the pain very well. And subjectively he feels the left side pain is getting better  he forgot to do the lab test I prescribed during last visit.    Patient tells me that he used to be a heavy drinker but does not drink any more. He has received ibrutinib and has not started as he missed the chemo class.   Past Medical History:  Diagnosis Date  . Anemia 2008  . Arthritis   . Chronic lymphocytic leukemia (CLL), B-cell ([place]) 06/15/2015  . Hyperlipidemia   . Peripheral vascular disease [facility])     Past Surgical History:   Procedure Laterality Date  . COLONOSCOPY    . COLONOSCOPY WITH PROPOFOL N/A 07/23/2015   Procedure: COLONOSCOPY WITH PROPOFOL;  Surgeon: [name], MD;  Location[facility] ENDOSCOPY;  Service: Endoscopy;  Laterality: N/A;  . ESOPHAGOGASTRODUODENOSCOPY (EGD) WITH PROPOFOL N/A 08/20/2015   Procedure: ESOPHAGOGASTRODUODENOSCOPY (EGD) WITH PROPOFOL;  Surgeon: [name], MD;  Location: [facility] ENDOSCOPY;  Service: Endoscopy;  Laterality: N/A;  . lymp node removal Right 2011   arm   left arm 2015  . LYMPH NODE BIOPSY  08/2014    Family History  Problem Relation Age of Onset  . Kidney cancer Neg Hx   . Bladder Cancer Neg Hx   . Prostate cancer Neg Hx     Social History:  reports that he has been smoking Cigars.  He has never used smokeless tobacco. He reports that he does not drink alcohol or use drugs.  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  Allergies:  Allergies  Allergen Reactions  . Latex Rash    Current Medications: Ibuprofen PRN Review of Systems:  Review of Systems  Constitutional: Positive for malaise/fatigue and weight loss.  HENT: Negative.   Eyes: Negative.   Respiratory: Negative.   Cardiovascular: Negative.   Gastrointestinal: Negative.   Genitourinary: Negative.   Musculoskeletal: Positive for neck pain.       Left-sided neck pain improved  Skin: Negative.   Neurological: Positive for weakness.  Endo/Heme/Allergies: Negative.  Psychiatric/Behavioral: Negative.    Physical Exam: Blood pressure 115/65, pulse (!) 42, temperature (!) 95.1 F (35.1 C), temperature source Tympanic, resp. rate 16, weight 130 lb 8 oz (59.2 kg). GENERAL:  no acute distress. MENTAL STATUS:  Alert and oriented to person, place and time. HEAD:   Alopecia.    Normocephalic, atraumatic, face symmetric, no Cushingoid features. EYES:  Pupils equal round and reactive to light and accomodation.  No  conjunctivitis or scleral icterus. ENT:  Oropharynx clear without lesion.  Dentures.  Tongue normal. Mucous membranes moist.  RESPIRATORY:  Clear to auscultation without rales, wheezes or rhonchi. CARDIOVASCULAR:  Regular rate and rhythm without murmur, rub or gallop. ABDOMEN:  Soft, non-tender, with active bowel sounds, and no hepatosplenomegaly.  No masses. SKIN:  No rashes, ulcers or lesions. EXTREMITIES: No edema, no skin discoloration or tenderness.  No palpable cords. LYMPH NODES:  right supraclavicular small mobile lymph node. Axillary lymphadenopathy bilateral,The largest one on the right is about 2cm. NEUROLOGICAL: Unremarkable. PSYCH:  Appropriate.  LABORATORY RESULTS. CBC    Component Value Date/Time   WBC 22.5 (H) 08/12/2017 1446   RBC 4.63 08/12/2017 1446   HGB 11.2 (L) 08/12/2017 1446   HGB 13.0 03/13/2015 0852   HCT 34.3 (L) 08/12/2017 1446   HCT 40.9 03/13/2015 0852   PLT 214 08/12/2017 1446   PLT 217 03/13/2015 0852   MCV 74.1 (L) 08/12/2017 1446   MCV 78 (L) 03/13/2015 0852   MCH 24.3 (L) 08/12/2017 1446   MCHC 32.8 08/12/2017 1446   RDW 16.1 (H) 08/12/2017 1446   RDW 16.0 (H) 03/13/2015 0852   LYMPHSABS 17.4 (H) 08/12/2017 1446   LYMPHSABS 3.9 (H) 03/13/2015 0852   MONOABS 0.7 08/12/2017 1446   MONOABS 0.4 03/13/2015 0852   EOSABS 0.3 08/12/2017 1446   EOSABS 0.4 03/13/2015 0852   BASOSABS 0.1 08/12/2017 1446   BASOSABS 0.1 03/13/2015 0852   CMP     Component Value Date/Time   NA 138 08/12/2017 1446   NA 136 03/13/2015 0852   K 4.3 08/12/2017 1446   K 3.9 03/13/2015 0852   CL 106 08/12/2017 1446   CL 104 03/13/2015 0852   CO2 26 08/12/2017 1446   CO2 26 03/13/2015 0852   GLUCOSE 101 (H) 08/12/2017 1446   GLUCOSE 96 03/13/2015 0852   BUN 10 08/12/2017 1446   BUN 11 03/13/2015 0852   CREATININE 1.08 08/12/2017 1446   CREATININE 1.09 03/13/2015 0852   CALCIUM 9.0 08/12/2017 1446   CALCIUM 9.3 03/13/2015 0852   PROT 7.2 08/12/2017 1446   PROT  8.1 03/13/2015 0852   ALBUMIN 3.9 08/12/2017 1446   ALBUMIN 4.5 03/13/2015 0852   AST 21 08/12/2017 1446   AST 26 03/13/2015 0852   ALT 13 (L) 08/12/2017 1446   ALT 12 (L) 03/13/2015 0852   ALKPHOS 47 08/12/2017 1446   ALKPHOS 57 03/13/2015 0852   BILITOT 0.6 08/12/2017 1446   BILITOT 1.2 03/13/2015 0852   GFRNONAA >60 08/12/2017 1446   GFRNONAA >60 03/13/2015 0852   GFRAA >60 08/12/2017 1446   GFRAA >60 03/13/2015 0852   PATHOLOGY:  11/02/2016 Peripheral blood FISH studies revealed 20% of nuclei positive for loss of 1 ATM signal, 63% of nuclei positive for trisomy 12, and 32% of nuclei positive for loss of 1 TP53 signal. Results for CCND1/IGH and 13q were normal.  09/13/2014 Left axillary node biopsy on  confirmed B-cell small lymphocytic lymphoma (B-CLL/SLL)   IMAGE STUDIES  02/09/2014 Chest and abdomen  CT scan  revealed bulky bilateral axillary adenopathy and bulky intraperitoneal and retroperitoneal adenopathy. 07/11/2014 PET scan  revealed mildly hypermetabolic lymph nodes in the chest, abdomen, and pelvis and mildly hypermetabolic spleen.  06/24/2016 PET scan  revealed bilateral moderate hypermetabolic axillary lymphadenopathy increased in size (1.6 cm compared to 1.3 cm) and metabolism (SUV 3.9 compared to 3.6).  There were bilateral moderate hypermetabolic external iliac lymphadenopathy stable to decreased in metabolism.  There was mesenteric and retroperitoneal lymphadenopathy stable in size and non hypermetabolic.  There were no new sites of hypermetabolic lymphoma.  There were subcentimeter pulmonary nodules stable and below PET resolution. 07/21/2017 CT chest abdomen pelvis w contrast:  Mild interval increase in bulky mesenteric lymphadenopathy since prior exam. Stable lymphadenopathy in bilateral axillary regions, abdominal retroperitoneum, and bilateral external iliac chains.  PREVIOUS ENDOSCOPY He has slowly progressive microcytic anemia.  Colonoscopy on 07/23/2015 revealed  diverticulosis in the sigmoid colon.  EGD on 08/20/2015 was normal. Guaiac cards x 3 were negative in 06/2015.  His diet fluctuates with his appetite.  He denies any melana or hematochezia.   Assessment:  [name] is a 66 y.o. male with B-cell small lymphocytic lymphoma (B-cell CLL/SLL) initially presented with hemolytic anemia in 01/2014, s/p treatment with steroid and Rituximab, now with significant weight loss, lack of appetite and fatigue.   1. CLL (chronic lymphocytic leukemia) ([place])   2. Weight loss   3. Diarrhea, unspecified type   4. Neck pain on left side   5. Goals of care, counseling/discussion   6. Beta thalassemia minor   7. Chronic lymphocytic leukemia of B-cell type in remission [facility])    Patient does have severe constitutional symptoms, including profound weight loss, lack of appetite and fatigue.   Plan: # Lab results and CT results were discussed with patient previously. It appears that his leukocytosis wax and wane, however not doubled. CT scan showed mild regression of mesenteric lymphadenopathy. If he had not has these constitutional symptoms, I would have recommended to closely observe him. I recommended him to start Ibrutinib for treatment during las visit and he has not started the treatment.   Today he appears feel weAnd his left side neck and shoulder pain improves with narcotics.Likely musculoskeletal, muscle straining. I suggest to add Flexeril 5 mg 3 times a day as needed, and suggest him to wean off the necrotics. Rx sent to pharmacy. Etiology for weight loss was previously explored and findings revealed no clear explanation of his symptoms. His weight has been stable for a few weeks, and today seems to have 3- 4 pound drop. Patient feels the same He did have chronic diarrhea. I suspect he may have pancreatic insufficiency from previous drinking history. Will check fecal fat, pancreatic elastase, and serum trypsin.  these labs were ordered during the last  encounter and he forgets. He will get the labs done today.  I suggests him not to start Ibrutinib and have GI work up to see if his weight loss is due to other etiology. Patient agrees.     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  Follow up with me in 2 weeks.  Total face to face encounter time for this patient visit was 25 min. >50% of the time was  spent in counseling and coordination of care.     Earlie Server, MD  08/19/2017, 4:08 PM

## 2017-08-19 NOTE — Progress Notes (Signed)
Patient here today for follow up.   

## 2017-08-20 ENCOUNTER — Encounter: Payer: Self-pay | Admitting: Oncology

## 2017-08-20 DIAGNOSIS — Z122 Encounter for screening for malignant neoplasm of respiratory organs: Secondary | ICD-10-CM | POA: Insufficient documentation

## 2017-08-20 LAB — MISC LABCORP TEST (SEND OUT): LABCORP TEST CODE: 10355

## 2017-08-25 ENCOUNTER — Other Ambulatory Visit: Payer: Self-pay | Admitting: *Deleted

## 2017-08-25 DIAGNOSIS — R197 Diarrhea, unspecified: Secondary | ICD-10-CM

## 2017-09-01 LAB — FECAL FAT, QUALITATIVE
FAT QUAL NEUTRAL STL: NORMAL
FAT QUAL TOTAL STL: NORMAL

## 2017-09-01 LAB — PANCREATIC ELASTASE, FECAL: Pancreatic Elastase-1, Stool: >500 ug Elast./g (ref >200)

## 2017-09-02 ENCOUNTER — Telehealth: Payer: Self-pay | Admitting: Oncology

## 2017-09-02 ENCOUNTER — Inpatient Hospital Stay: Payer: BLUE CROSS/BLUE SHIELD | Attending: Oncology | Admitting: Oncology

## 2017-09-02 VITALS — BP 125/72 | HR 62 | Temp 96.2°F | Wt 132.0 lb

## 2017-09-02 DIAGNOSIS — I739 Peripheral vascular disease, unspecified: Secondary | ICD-10-CM

## 2017-09-02 DIAGNOSIS — R5383 Other fatigue: Secondary | ICD-10-CM

## 2017-09-02 DIAGNOSIS — D563 Thalassemia minor: Secondary | ICD-10-CM

## 2017-09-02 DIAGNOSIS — D589 Hereditary hemolytic anemia, unspecified: Secondary | ICD-10-CM

## 2017-09-02 DIAGNOSIS — C9110 Chronic lymphocytic leukemia of B-cell type not having achieved remission: Secondary | ICD-10-CM

## 2017-09-02 DIAGNOSIS — C9190 Lymphoid leukemia, unspecified not having achieved remission: Secondary | ICD-10-CM | POA: Diagnosis not present

## 2017-09-02 DIAGNOSIS — R530 Neoplastic (malignant) related fatigue: Secondary | ICD-10-CM

## 2017-09-02 DIAGNOSIS — R531 Weakness: Secondary | ICD-10-CM | POA: Diagnosis not present

## 2017-09-02 DIAGNOSIS — M542 Cervicalgia: Secondary | ICD-10-CM | POA: Diagnosis not present

## 2017-09-02 DIAGNOSIS — D561 Beta thalassemia: Secondary | ICD-10-CM | POA: Diagnosis not present

## 2017-09-02 DIAGNOSIS — R634 Abnormal weight loss: Secondary | ICD-10-CM

## 2017-09-02 DIAGNOSIS — E785 Hyperlipidemia, unspecified: Secondary | ICD-10-CM

## 2017-09-02 DIAGNOSIS — R630 Anorexia: Secondary | ICD-10-CM

## 2017-09-02 MED ORDER — GABAPENTIN 300 MG PO CAPS: 300.0000 mg | ORAL_CAPSULE | Freq: Three times a day (TID) | ORAL | 2 refills | Status: DC

## 2017-09-02 NOTE — Telephone Encounter (Signed)
Lab & f/u appt given to patient. Awaiting CT order correction/adjustment. Per Almyra Free, CT is for 3 mths. CT orders adjusted from with contrast to without contrast. CT appt schd mailed to patient.

## 2017-09-02 NOTE — Progress Notes (Signed)
Patient here today for follow up Patient c/o neck pain, left arm pain, worsens when turns head to the right.

## 2017-09-03 ENCOUNTER — Encounter: Payer: Self-pay | Admitting: Oncology

## 2017-09-03 NOTE — Progress Notes (Signed)
[facility] day:  09/03/17    Chief Complaint: [name] is a 66 y.o. 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  HPI:  The patient was last seen in the medical onocology clinic on 12/25/2016. Patient reports feeling profound fatigue, lack of appetite, weight loss. He weight 128 pounds today, comparing to 137 pounds when he was here in January 2018.  He met with [name], registered dietitian on 01/04/2017. Patient used to follow up with Dr.[name].  He missed his  follow-up with Dr. Mike Gip.   Treatment history: He originally presented with hemolytic anemia in 01/2014.  He was treated with prednisone. With taper of prednisone, his hemolysis returned.I think it's only positives are Per Dr.[name]'s note, patient received 4 weekly cycles of Rituxan (10/02/2014- 10/23/2014).   07/23/2017 Decision was made to start Ibrutinib as patient is very symptomatic (fatigue and weight loss, lack of appetite). Patient did not show up at the chemo education class.   INTERVAL HISTORY Today patient complains persistent neck pain.  His weight fluctuated. Today he has gain a few pounds.   Past Medical History:  Diagnosis Date  . Anemia 2008  . Arthritis   . Chronic lymphocytic leukemia (CLL), B-cell ([place]) 06/15/2015  . Hyperlipidemia   . Peripheral vascular disease [facility])     Past Surgical History:  Procedure Laterality Date  . COLONOSCOPY    . COLONOSCOPY WITH PROPOFOL N/A 07/23/2015   Procedure: COLONOSCOPY WITH PROPOFOL;  Surgeon: [name], MD;  Location: [facility] ENDOSCOPY;  Service: Endoscopy;  Laterality: N/A;  . ESOPHAGOGASTRODUODENOSCOPY (EGD) WITH PROPOFOL N/A 08/20/2015   Procedure: ESOPHAGOGASTRODUODENOSCOPY (EGD) WITH PROPOFOL;  Surgeon: [name], MD;  Location: [facility] ENDOSCOPY;  Service: Endoscopy;  Laterality: N/A;  . lymp node removal Right 2011   arm   left arm 2015   . LYMPH NODE BIOPSY  08/2014    Family History  Problem Relation Age of Onset  . Kidney cancer Neg Hx   . Bladder Cancer Neg Hx   . Prostate cancer Neg Hx     Social History:  reports that he has been smoking Cigars.  He has never used smokeless tobacco. He reports that he does not drink alcohol or use drugs.  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  Allergies:  Allergies  Allergen Reactions  . Latex Rash    Current Medications: Ibuprofen PRN Review of Systems:  Review of Systems  Constitutional: Positive for malaise/fatigue and weight loss.  HENT: Negative.   Eyes: Negative.   Respiratory: Negative.   Cardiovascular: Negative.   Gastrointestinal: Negative.   Genitourinary: Negative.   Musculoskeletal: Positive for neck pain.       Left-sided neck pain improved  Skin: Negative.   Neurological: Positive for weakness.  Endo/Heme/Allergies: Negative.   Psychiatric/Behavioral: Negative.    Physical Exam: Blood pressure 125/72, pulse 62, temperature (!) 96.2 F (35.7 C), temperature source Tympanic, weight 132 lb (59.9 kg). GENERAL:  no acute distress. MENTAL STATUS:  Alert and oriented to person, place and time. HEAD:   Alopecia.    Normocephalic, atraumatic, face symmetric, no Cushingoid features. EYES:  Pupils equal round and reactive to light and accomodation.  No conjunctivitis or scleral icterus. ENT:  Oropharynx clear without lesion.  Dentures.  Tongue normal. Mucous membranes moist.  RESPIRATORY:  Clear to auscultation  without rales, wheezes or rhonchi. CARDIOVASCULAR:  Regular rate and rhythm without murmur, rub or gallop. ABDOMEN:  Soft, non-tender, with active bowel sounds, and no hepatosplenomegaly.  No masses. SKIN:  No rashes, ulcers or lesions. EXTREMITIES: No edema, no skin discoloration or tenderness.  No palpable cords. LYMPH NODES:  right supraclavicular small mobile lymph node.  Axillary lymphadenopathy bilateral,The largest one on the right is about 2cm. NEUROLOGICAL: Unremarkable. PSYCH:  Appropriate.  LABORATORY RESULTS. CBC    Component Value Date/Time   WBC 22.5 (H) 08/12/2017 1446   RBC 4.63 08/12/2017 1446   HGB 11.2 (L) 08/12/2017 1446   HGB 13.0 03/13/2015 0852   HCT 34.3 (L) 08/12/2017 1446   HCT 40.9 03/13/2015 0852   PLT 214 08/12/2017 1446   PLT 217 03/13/2015 0852   MCV 74.1 (L) 08/12/2017 1446   MCV 78 (L) 03/13/2015 0852   MCH 24.3 (L) 08/12/2017 1446   MCHC 32.8 08/12/2017 1446   RDW 16.1 (H) 08/12/2017 1446   RDW 16.0 (H) 03/13/2015 0852   LYMPHSABS 17.4 (H) 08/12/2017 1446   LYMPHSABS 3.9 (H) 03/13/2015 0852   MONOABS 0.7 08/12/2017 1446   MONOABS 0.4 03/13/2015 0852   EOSABS 0.3 08/12/2017 1446   EOSABS 0.4 03/13/2015 0852   BASOSABS 0.1 08/12/2017 1446   BASOSABS 0.1 03/13/2015 0852   CMP     Component Value Date/Time   NA 138 08/12/2017 1446   NA 136 03/13/2015 0852   K 4.3 08/12/2017 1446   K 3.9 03/13/2015 0852   CL 106 08/12/2017 1446   CL 104 03/13/2015 0852   CO2 26 08/12/2017 1446   CO2 26 03/13/2015 0852   GLUCOSE 101 (H) 08/12/2017 1446   GLUCOSE 96 03/13/2015 0852   BUN 10 08/12/2017 1446   BUN 11 03/13/2015 0852   CREATININE 1.08 08/12/2017 1446   CREATININE 1.09 03/13/2015 0852   CALCIUM 9.0 08/12/2017 1446   CALCIUM 9.3 03/13/2015 0852   PROT 7.2 08/12/2017 1446   PROT 8.1 03/13/2015 0852   ALBUMIN 3.9 08/12/2017 1446   ALBUMIN 4.5 03/13/2015 0852   AST 21 08/12/2017 1446   AST 26 03/13/2015 0852   ALT 13 (L) 08/12/2017 1446   ALT 12 (L) 03/13/2015 0852   ALKPHOS 47 08/12/2017 1446   ALKPHOS 57 03/13/2015 0852   BILITOT 0.6 08/12/2017 1446   BILITOT 1.2 03/13/2015 0852   GFRNONAA >60 08/12/2017 1446   GFRNONAA >60 03/13/2015 0852   GFRAA >60 08/12/2017 1446   GFRAA >60 03/13/2015 0852   PATHOLOGY:  11/02/2016 Peripheral blood FISH studies revealed 20% of nuclei positive for loss of 1 ATM  signal, 63% of nuclei positive for trisomy 12, and 32% of nuclei positive for loss of 1 TP53 signal. Results for CCND1/IGH and 13q were normal.  09/13/2014 Left axillary node biopsy on  confirmed B-cell small lymphocytic lymphoma (B-CLL/SLL)   IMAGE STUDIES  02/09/2014 Chest and abdomen CT scan  revealed bulky bilateral axillary adenopathy and bulky intraperitoneal and retroperitoneal adenopathy. 07/11/2014 PET scan  revealed mildly hypermetabolic lymph nodes in the chest, abdomen, and pelvis and mildly hypermetabolic spleen.  06/24/2016 PET scan  revealed bilateral moderate hypermetabolic axillary lymphadenopathy increased in size (1.6 cm compared to 1.3 cm) and metabolism (SUV 3.9 compared to 3.6).  There were bilateral moderate hypermetabolic external iliac lymphadenopathy stable to decreased in metabolism.  There was mesenteric and retroperitoneal lymphadenopathy stable in size and non hypermetabolic.  There were no new sites of hypermetabolic lymphoma.  There were  subcentimeter pulmonary nodules stable and below PET resolution. 07/21/2017 CT chest abdomen pelvis w contrast:  Mild interval increase in bulky mesenteric lymphadenopathy since prior exam. Stable lymphadenopathy in bilateral axillary regions, abdominal retroperitoneum, and bilateral external iliac chains.  PREVIOUS ENDOSCOPY He has slowly progressive microcytic anemia.  Colonoscopy on 07/23/2015 revealed diverticulosis in the sigmoid colon.  EGD on 08/20/2015 was normal. Guaiac cards x 3 were negative in 06/2015.  His diet fluctuates with his appetite.  He denies any melana or hematochezia.   Assessment:  [name] is a 66 y.o. male with B-cell small lymphocytic lymphoma (B-cell CLL/SLL) initially presented with hemolytic anemia in 01/2014, s/p treatment with steroid and Rituximab, now with significant weight loss, lack of appetite and fatigue.   1. CLL (chronic lymphocytic leukemia) ([place])   2. Weight loss   3. Beta thalassemia  minor   4. Neoplastic malignant related fatigue   5. Cervicalgia    Patient does have severe constitutional symptoms, including profound weight loss, lack of appetite and fatigue.   Plan: #Peristent left side neck and shoulder pain not improving. Decline MRI cervicle spine. Will order CT cervical spine for additional work up.  #Etiology for weight loss was previously explored and findings revealed no clear explanation of his symptoms. His weight has been flucturating Patient feels the same # Fecal study showed did not confirm pancreatic insufficiency.  # Continue to hold ibrutinib as it is not clear that his fatigue weight loss is related to CLL. Patient agrees with plan.  He does not have primary care physician. I refer to [facility].  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  Follow up with me in 3 months.  Total face to face encounter time for this patient visit was 25 min. >50% of the time was  spent in counseling and coordination of care.     Earlie Server, MD  09/03/2017, 3:43 PM

## 2017-09-29 ENCOUNTER — Ambulatory Visit (INDEPENDENT_AMBULATORY_CARE_PROVIDER_SITE_OTHER): Payer: BLUE CROSS/BLUE SHIELD | Admitting: Family

## 2017-09-29 ENCOUNTER — Encounter: Payer: Self-pay | Admitting: Family

## 2017-09-29 VITALS — BP 120/60 | HR 57 | Temp 98.7°F | Ht 68.0 in | Wt 139.4 lb

## 2017-09-29 DIAGNOSIS — F339 Major depressive disorder, recurrent, unspecified: Secondary | ICD-10-CM | POA: Insufficient documentation

## 2017-09-29 DIAGNOSIS — I2510 Atherosclerotic heart disease of native coronary artery without angina pectoris: Secondary | ICD-10-CM

## 2017-09-29 DIAGNOSIS — Z7689 Persons encountering health services in other specified circumstances: Secondary | ICD-10-CM | POA: Diagnosis not present

## 2017-09-29 LAB — CBC WITH DIFFERENTIAL/PLATELET
BASOS ABS: 0.1 10*3/uL (ref 0.0–0.1)
Basophils Relative: 0.4 % (ref 0.0–3.0)
EOS ABS: 0.4 10*3/uL (ref 0.0–0.7)
Eosinophils Relative: 1.7 % (ref 0.0–5.0)
HCT: 36.4 % — ABNORMAL LOW (ref 39.0–52.0)
Hemoglobin: 11.3 g/dL — ABNORMAL LOW (ref 13.0–17.0)
LYMPHS ABS: 18.3 10*3/uL — AB (ref 0.7–4.0)
Lymphocytes Relative: 75.8 % — ABNORMAL HIGH (ref 12.0–46.0)
MCHC: 31 g/dL (ref 30.0–36.0)
MCV: 77 fl — AB (ref 78.0–100.0)
MONO ABS: 0.7 10*3/uL (ref 0.1–1.0)
Monocytes Relative: 2.8 % — ABNORMAL LOW (ref 3.0–12.0)
NEUTROS ABS: 4.7 10*3/uL (ref 1.4–7.7)
NEUTROS PCT: 19.3 % — AB (ref 43.0–77.0)
PLATELETS: 224 10*3/uL (ref 150.0–400.0)
RBC: 4.72 Mil/uL (ref 4.22–5.81)
RDW: 16.4 % — ABNORMAL HIGH (ref 11.5–15.5)
WBC: 24.1 10*3/uL (ref 4.0–10.5)

## 2017-09-29 LAB — COMPREHENSIVE METABOLIC PANEL
ALT: 10 U/L (ref 0–53)
AST: 15 U/L (ref 0–37)
Albumin: 3.8 g/dL (ref 3.5–5.2)
Alkaline Phosphatase: 52 U/L (ref 39–117)
BILIRUBIN TOTAL: 0.5 mg/dL (ref 0.2–1.2)
BUN: 11 mg/dL (ref 6–23)
CO2: 29 meq/L (ref 19–32)
Calcium: 9.1 mg/dL (ref 8.4–10.5)
Chloride: 106 mEq/L (ref 96–112)
Creatinine, Ser: 1.02 mg/dL (ref 0.40–1.50)
GFR: 94.5 mL/min (ref >60.00)
GLUCOSE: 93 mg/dL (ref 70–99)
POTASSIUM: 4.3 meq/L (ref 3.5–5.1)
SODIUM: 140 meq/L (ref 135–145)
TOTAL PROTEIN: 6.7 g/dL (ref 6.0–8.3)

## 2017-09-29 LAB — LIPID PANEL
CHOL/HDL RATIO: 5
Cholesterol: 149 mg/dL (ref 0–200)
HDL: 32.4 mg/dL — AB (ref >39.00)
LDL Cholesterol: 103 mg/dL — ABNORMAL HIGH (ref 0–99)
NONHDL: 116.72
Triglycerides: 71 mg/dL (ref 0.0–149.0)
VLDL: 14.2 mg/dL (ref 0.0–40.0)

## 2017-09-29 LAB — VITAMIN D 25 HYDROXY (VIT D DEFICIENCY, FRACTURES): VITD: 16.24 ng/mL — AB (ref 30.00–100.00)

## 2017-09-29 LAB — TSH: TSH: 2.75 u[IU]/mL (ref 0.35–4.50)

## 2017-09-29 LAB — HEMOGLOBIN A1C: Hgb A1c MFr Bld: 5.9 % (ref 4.6–6.5)

## 2017-09-29 LAB — PSA: PSA: 1.15 ng/mL (ref 0.10–4.00)

## 2017-09-29 MED ORDER — FLUOXETINE HCL 20 MG PO CAPS: 20.0000 mg | ORAL_CAPSULE | Freq: Every morning | ORAL | 3 refills | Status: DC

## 2017-09-29 MED ORDER — PRAVASTATIN SODIUM 40 MG PO TABS: 40.0000 mg | ORAL_TABLET | Freq: Every day | ORAL | 2 refills | Status: DC

## 2017-09-29 NOTE — Assessment & Plan Note (Deleted)
Will start statin therapy today based on CT abdomen. Pending lipid panel.

## 2017-09-29 NOTE — Assessment & Plan Note (Addendum)
Discussed atherosclerosis on CT Abdomen and pelvis, advised statin therapy. Patient agrees. Will start statin therapy today. Pending lipid panel.

## 2017-09-29 NOTE — Assessment & Plan Note (Signed)
Trial prozac. F/u 6 weeks

## 2017-09-29 NOTE — Progress Notes (Signed)
Pre visit review using our clinic review tool, if applicable. No additional management support is needed unless otherwise documented below in the visit note. 

## 2017-09-29 NOTE — Assessment & Plan Note (Signed)
Pending cpe labs; will let me know about repeat colonoscopy and when due. Will return for cpe.

## 2017-09-29 NOTE — Patient Instructions (Addendum)
Double check when to repeat colonoscopy- call office where it was. I want to know when it is due   Follow up 6 weeks  Pleasure meeting you!

## 2017-09-29 NOTE — Progress Notes (Signed)
Subjective:    Patient ID: [name], male    DOB: 04/12/1951, 66 y.o.   MRN: [id number]  CC: [name] is a 66 y.o. male who presents today for follow up.   HPI: 'never had primary care'   [place] with Dr Tasia Catchings for CLL. Symptoms onset 2009 - started with lightheadedness in which turned into hospitiization for anemia. axillary lymph node biopsy 2017 which showed malignancy per patient.   Pending ct cervical spine for chronic left arm pain since lymph node ressection.   Concerns today include weight loss, however which has stablized. On a high for weight right now. Had been 142 recently. Comfortable with 163 which had been for a long time prior.     Depression- of late with CLL. No h/o depression. No anxiety. no thoughts of hurting himself or anyone else    CT abdomen pelvis 07/2017- no new sites lymphadenopathy in mediastinum, mild increase lymphadenopathy; aortic atherosclerosis, no bone lesions HISTORY:  Past Medical History:  Diagnosis Date  . Anemia 2008  . Arthritis   . Chronic lymphocytic leukemia (CLL), B-cell ([place]) 06/15/2015  . Hyperlipidemia   . Peripheral vascular disease [facility])    Past Surgical History:  Procedure Laterality Date  . COLONOSCOPY    . COLONOSCOPY WITH PROPOFOL N/A 07/23/2015   Procedure: COLONOSCOPY WITH PROPOFOL;  Surgeon: [name], MD;  Location[facility] ENDOSCOPY;  Service: Endoscopy;  Laterality: N/A;  . ESOPHAGOGASTRODUODENOSCOPY (EGD) WITH PROPOFOL N/A 08/20/2015   Procedure: ESOPHAGOGASTRODUODENOSCOPY (EGD) WITH PROPOFOL;  Surgeon: [name], MD;  Location: [facility] ENDOSCOPY;  Service: Endoscopy;  Laterality: N/A;  . lymp node removal Right 2011   arm   left arm 2015  . LYMPH NODE BIOPSY  08/2014   Family History  Problem Relation Age of Onset  . Kidney cancer Neg Hx   . Bladder Cancer Neg Hx   . Prostate cancer Neg Hx     Allergies: Latex Current Outpatient Prescriptions on File Prior to Visit  Medication Sig Dispense  Refill  . cyclobenzaprine (FLEXERIL) 5 MG tablet Take 1 tablet (5 mg total) by mouth 3 (three) times daily as needed for muscle spasms. 90 tablet 0  . gabapentin (NEURONTIN) 300 MG capsule Take 1 capsule (300 mg total) by mouth 3 (three) times daily. 90 capsule 2  . HYDROcodone-acetaminophen (NORCO) 5-325 MG tablet Take 1-2 tablets by mouth every 6 (six) hours as needed for moderate pain. 30 tablet 0  . MYRBETRIQ 25 MG TB24 tablet   0   No current facility-administered medications on file prior to visit.     Social History  Substance Use Topics  . Smoking status: Current Some Day Smoker    Types: Cigars  . Smokeless tobacco: Never Used  . Alcohol use No    Review of Systems  Constitutional: Negative for chills and fever.  Respiratory: Negative for cough.   Cardiovascular: Negative for chest pain and palpitations.  Gastrointestinal: Negative for nausea and vomiting.  Psychiatric/Behavioral: Negative for sleep disturbance and suicidal ideas.      Objective:    BP 120/60   Pulse (!) 57   Temp 98.7 F (37.1 C) (Oral)   Ht 5\' 8"  (1.727 m)   Wt 139 lb 6.4 oz (63.2 kg)   SpO2 98%   BMI 21.20 kg/m  BP Readings from Last 3 Encounters:  09/29/17 120/60  09/02/17 125/72  08/19/17 115/65   Wt Readings from Last 3 Encounters:  09/29/17 139 lb 6.4 oz (  63.2 kg)  09/02/17 132 lb (59.9 kg)  08/19/17 130 lb 8 oz (59.2 kg)    Physical Exam  Constitutional: He appears well-developed and well-nourished.  Cardiovascular: Regular rhythm and normal heart sounds.   Pulmonary/Chest: Effort normal and breath sounds normal. No respiratory distress. He has no wheezes. He has no rhonchi. He has no rales.  Neurological: He is alert.  Skin: Skin is warm and dry.  Psychiatric: He has a normal mood and affect. His speech is normal and behavior is normal.  Vitals reviewed.      Assessment & Plan:   Problem List Items Addressed This Visit      Cardiovascular and Mediastinum   Coronary  atherosclerosis    Discussed atherosclerosis on CT Abdomen and pelvis, advised statin therapy. Patient agrees. Will start statin therapy today. Pending lipid panel.         Relevant Medications   pravastatin (PRAVACHOL) 40 MG tablet   Peripheral vascular disease (HCC)   Relevant Medications   pravastatin (PRAVACHOL) 40 MG tablet     Other   Encounter to establish care - Primary    Pending cpe labs; will let me know about repeat colonoscopy and when due. Will return for cpe.       Relevant Orders   CBC with Differential/Platelet   Comprehensive metabolic panel   Lipid panel   PSA   TSH   VITAMIN D 25 Hydroxy (Vit-D Deficiency, Fractures)   Hemoglobin A1c   Depression, recurrent (HCC)    Trial prozac. F/u 6 weeks      Relevant Medications   FLUoxetine (PROZAC) 20 MG capsule       I am having Mr. Olguin start on FLUoxetine and pravastatin. I am also having him maintain his MYRBETRIQ, HYDROcodone-acetaminophen, cyclobenzaprine, gabapentin, and IMBRUVICA.   Meds ordered this encounter  Medications  . IMBRUVICA 420 MG TABS    Refill:  0  . FLUoxetine (PROZAC) 20 MG capsule    Sig: Take 1 capsule (20 mg total) by mouth every morning.    Dispense:  90 capsule    Refill:  3    Order Specific Question:   Supervising Provider    Answer:   Deborra Medina L [[id number]]  . pravastatin (PRAVACHOL) 40 MG tablet    Sig: Take 1 tablet (40 mg total) by mouth daily.    Dispense:  90 tablet    Refill:  2    Order Specific Question:   Supervising Provider    Answer:   Crecencio Mc [[id number]]    Return precautions given.   Risks, benefits, and alternatives of the medications and treatment plan prescribed today were discussed, and patient expressed understanding.   Education regarding symptom management and diagnosis given to patient on AVS.  Continue to follow with [name], FNP for routine health maintenance.   Earney Hamburg and I agreed with plan.   [name],  FNP

## 2017-10-07 ENCOUNTER — Emergency Department
Admission: EM | Admit: 2017-10-07 | Discharge: 2017-10-07 | Disposition: A | Discharge status: Home / Self Care | Payer: BLUE CROSS/BLUE SHIELD | Attending: Emergency Medicine | Admitting: Emergency Medicine

## 2017-10-07 ENCOUNTER — Emergency Department: Payer: BLUE CROSS/BLUE SHIELD

## 2017-10-07 ENCOUNTER — Encounter: Payer: Self-pay | Admitting: Emergency Medicine

## 2017-10-07 DIAGNOSIS — Z91040 Latex allergy status: Secondary | ICD-10-CM | POA: Diagnosis not present

## 2017-10-07 DIAGNOSIS — Z79899 Other long term (current) drug therapy: Secondary | ICD-10-CM | POA: Insufficient documentation

## 2017-10-07 DIAGNOSIS — R630 Anorexia: Secondary | ICD-10-CM | POA: Diagnosis present

## 2017-10-07 DIAGNOSIS — C9110 Chronic lymphocytic leukemia of B-cell type not having achieved remission: Secondary | ICD-10-CM | POA: Diagnosis not present

## 2017-10-07 DIAGNOSIS — R5381 Other malaise: Secondary | ICD-10-CM | POA: Diagnosis not present

## 2017-10-07 DIAGNOSIS — R509 Fever, unspecified: Secondary | ICD-10-CM | POA: Diagnosis not present

## 2017-10-07 DIAGNOSIS — R5383 Other fatigue: Secondary | ICD-10-CM | POA: Diagnosis not present

## 2017-10-07 DIAGNOSIS — F17200 Nicotine dependence, unspecified, uncomplicated: Secondary | ICD-10-CM | POA: Insufficient documentation

## 2017-10-07 DIAGNOSIS — R6883 Chills (without fever): Secondary | ICD-10-CM | POA: Insufficient documentation

## 2017-10-07 DIAGNOSIS — C9190 Lymphoid leukemia, unspecified not having achieved remission: Secondary | ICD-10-CM | POA: Insufficient documentation

## 2017-10-07 LAB — LACTIC ACID, PLASMA: LACTIC ACID, VENOUS: 1.1 mmol/L (ref 0.5–1.9)

## 2017-10-07 LAB — CBC WITH DIFFERENTIAL/PLATELET
BASOS ABS: 0 10*3/uL (ref 0–0.1)
Basophils Relative: 0 %
Eosinophils Absolute: 0.2 10*3/uL (ref 0–0.7)
Eosinophils Relative: 1 %
HCT: 40.6 % (ref 40.0–52.0)
HEMOGLOBIN: 13 g/dL (ref 13.0–18.0)
LYMPHS ABS: 18.8 10*3/uL — AB (ref 1.0–3.6)
Lymphocytes Relative: 76 %
MCH: 24 pg — ABNORMAL LOW (ref 26.0–34.0)
MCHC: 32 g/dL (ref 32.0–36.0)
MCV: 74.8 fL — ABNORMAL LOW (ref 80.0–100.0)
MONOS PCT: 3 %
Monocytes Absolute: 0.7 10*3/uL (ref 0.2–1.0)
NEUTROS PCT: 20 %
Neutro Abs: 4.9 10*3/uL (ref 1.4–6.5)
Platelets: 278 10*3/uL (ref 150–440)
RBC: 5.42 MIL/uL (ref 4.40–5.90)
RDW: 15.6 % — AB (ref 11.5–14.5)
Smear Review: ADEQUATE
WBC: 24.6 10*3/uL — AB (ref 3.8–10.6)

## 2017-10-07 LAB — COMPREHENSIVE METABOLIC PANEL
ALBUMIN: 4.2 g/dL (ref 3.5–5.0)
ALK PHOS: 51 U/L (ref 38–126)
ALT: 12 U/L — ABNORMAL LOW (ref 17–63)
ANION GAP: 12 (ref 5–15)
AST: 23 U/L (ref 15–41)
BUN: 13 mg/dL (ref 6–20)
CO2: 23 mmol/L (ref 22–32)
Calcium: 9.5 mg/dL (ref 8.9–10.3)
Chloride: 102 mmol/L (ref 101–111)
Creatinine, Ser: 1.1 mg/dL (ref 0.61–1.24)
GFR calc Af Amer: >60 mL/min (ref >60)
GFR calc non Af Amer: >60 mL/min (ref >60)
GLUCOSE: 96 mg/dL (ref 65–99)
POTASSIUM: 3.9 mmol/L (ref 3.5–5.1)
Sodium: 137 mmol/L (ref 135–145)
Total Bilirubin: 1.2 mg/dL (ref 0.3–1.2)
Total Protein: 7.9 g/dL (ref 6.5–8.1)

## 2017-10-07 LAB — URINALYSIS, COMPLETE (UACMP) WITH MICROSCOPIC
BACTERIA UA: NONE SEEN
BILIRUBIN URINE: NEGATIVE
Glucose, UA: NEGATIVE mg/dL
KETONES UR: 5 mg/dL — AB
LEUKOCYTES UA: NEGATIVE
Nitrite: NEGATIVE
Protein, ur: NEGATIVE mg/dL
SQUAMOUS EPITHELIAL / LPF: NONE SEEN
Specific Gravity, Urine: 1.016 (ref 1.005–1.030)
pH: 5 (ref 5.0–8.0)

## 2017-10-07 MED ORDER — SODIUM CHLORIDE 0.9 % IV BOLUS (SEPSIS)
1000.0000 mL | Freq: Once | INTRAVENOUS | Status: AC
  Administered 2017-10-07: 1000 mL via INTRAVENOUS

## 2017-10-07 NOTE — Discharge Instructions (Signed)
Your labs, chest xray, and urine test today were all unremarkable.  Follow up with oncology for further evaluation of your symptoms.  Continue your outpatient workup as discussed with Dr. Talbert Cage.

## 2017-10-07 NOTE — ED Notes (Signed)
Resumed care from [facility].  Pt alert   Watching tv.   Family with pt.

## 2017-10-07 NOTE — ED Provider Notes (Signed)
[facility] Emergency Department Provider Note  ____________________________________________  Time seen: Approximately 4:11 PM  I have reviewed the triage vital signs and the nursing notes.   HISTORY  Chief Complaint Anorexia    HPI [name] is a 66 y.o. male who complains of loss of appetite and chills for the past 24 hours. Denies any pain anywhere. No headache dizziness or syncope. No vomiting or diarrhea. No shortness of breath. chills and intermittent, loss of appetite has been constant without aggravating or alleviating factors. Moderate intensity, patient has had very little to eat or drink over the past 24 hours.     Past Medical History:  Diagnosis Date  . Anemia 2008  . Arthritis   . 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 Hyperlipidemia   . Peripheral vascular disease [facility])      Patient Active Problem List   Diagnosis Date Noted  . Depression, recurrent ([place]) 09/29/2017  . Encounter to establish care 08/20/2017  . Fatigue 07/23/2017  . Coronary atherosclerosis 10/14/2016  . Emphysema lung ([place]) 10/14/2016  . BPH (benign prostatic hyperplasia) 10/14/2016  . Peripheral vascular disease ([place]) 10/14/2016  . Hyperlipidemia 10/14/2016  . Weight loss 06/21/2016  . Microcytic anemia 06/15/2015  . 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 COLONOSCOPY    . COLONOSCOPY WITH PROPOFOL N/A 07/23/2015   Procedure: COLONOSCOPY WITH PROPOFOL;  Surgeon: [name], MD;  Location: [facility] ENDOSCOPY;  Service: Endoscopy;  Laterality: N/A;  . ESOPHAGOGASTRODUODENOSCOPY (EGD) WITH PROPOFOL N/A 08/20/2015   Procedure: ESOPHAGOGASTRODUODENOSCOPY (EGD) WITH PROPOFOL;  Surgeon: [name], MD;  Location: [facility] ENDOSCOPY;  Service: Endoscopy;  Laterality: N/A;  . lymp node removal Right 2011   arm   left arm 2015  . LYMPH NODE BIOPSY  08/2014      Prior to Admission medications   Medication Sig Start Date End Date Taking? Authorizing Provider  cyclobenzaprine (FLEXERIL) 5 MG tablet Take 1 tablet (5 mg total) by mouth 3 (three) times daily as needed for muscle spasms. 08/19/17   Earlie Server, MD  FLUoxetine (PROZAC) 20 MG capsule Take 1 capsule (20 mg total) by mouth every morning. 09/29/17   [name], FNP  gabapentin (NEURONTIN) 300 MG capsule Take 1 capsule (300 mg total) by mouth 3 (three) times daily. 09/02/17   Earlie Server, MD  HYDROcodone-acetaminophen (NORCO) 5-325 MG tablet Take 1-2 tablets by mouth every 6 (six) hours as needed for moderate pain. 08/12/17   [name], MD  IMBRUVICA 420 MG TABS  08/09/17   [provider]  MYRBETRIQ 25 MG TB24 tablet  07/30/17   [provider]  pravastatin (PRAVACHOL) 40 MG tablet Take 1 tablet (40 mg total) by mouth daily. 09/29/17   [name], FNP     Allergies Latex   Family History  Problem Relation Age of Onset  . Kidney cancer Neg Hx   . Bladder Cancer Neg Hx   . Prostate cancer Neg Hx     Social History Social History  Substance Use Topics  . Smoking status: Current Some Day Smoker    Types: Cigars  . Smokeless tobacco: Never Used  . Alcohol use No    Review of Systems  Constitutional:   No fever positive chills.  ENT:   No sore throat. No rhinorrhea. Cardiovascular:   No chest pain or syncope. Respiratory:   No dyspnea or cough.  Gastrointestinal:   Negative for abdominal pain, vomiting and diarrhea.  Musculoskeletal:   Negative for focal pain or swelling All other systems reviewed and are negative except as documented above in ROS and HPI.  ____________________________________________   PHYSICAL EXAM:  VITAL SIGNS: ED Triage Vitals [10/07/17 1039]  Enc Vitals Group     BP (!) 149/77     Pulse Rate (!) 44     Resp 18     Temp 97.8 F (36.6 C)     Temp Source Oral     SpO2 100 %     Weight      Height      Head  Circumference      Peak Flow      Pain Score      Pain Loc      Pain Edu?      Excl. in [place]?     Vital signs reviewed, nursing assessments reviewed.   Constitutional:   Alert and oriented. Well appearing and in no distress. Eyes:   No scleral icterus.  EOMI. No nystagmus. No conjunctival pallor. PERRL. ENT   Head:   Normocephalic and atraumatic.   Nose:   No congestion/rhinnorhea.    Mouth/Throat:   dry mucous membranes, no pharyngeal erythema. No peritonsillar mass.    Neck:   No meningismus. Full ROM.no midline spinal tenderness Hematological/Lymphatic/Immunilogical:   No cervical lymphadenopathy. Cardiovascular:   bradycardia heart rate 50. Symmetric bilateral radial and DP pulses.  No murmurs.  Respiratory:   Normal respiratory effort without tachypnea/retractions. Breath sounds are clear and equal bilaterally. No wheezes/rales/rhonchi. Gastrointestinal:   Soft and nontender. Non distended. There is no CVA tenderness.  No rebound, rigidity, or guarding. Genitourinary:   deferred Musculoskeletal:   Normal range of motion in all extremities. No joint effusions.  No lower extremity tenderness.  No edema. Neurologic:   Normal speech and language.  Motor grossly intact. No gross focal neurologic deficits are appreciated.  Skin:    Skin is warm, dry and intact. No rash noted.  No petechiae, purpura, or bullae.  ____________________________________________    LABS (pertinent positives/negatives) (all labs ordered are listed, but only abnormal results are displayed) Labs Reviewed  CBC WITH DIFFERENTIAL/PLATELET - Abnormal; Notable for the following:       Result Value   WBC 24.6 (*)    MCV 74.8 (*)    MCH 24.0 (*)    RDW 15.6 (*)    Lymphs Abs 18.8 (*)    All other components within normal limits  COMPREHENSIVE METABOLIC PANEL - Abnormal; Notable for the following:    ALT 12 (*)    All other components within normal limits  URINALYSIS, COMPLETE (UACMP) WITH  MICROSCOPIC - Abnormal; Notable for the following:    Color, Urine YELLOW (*)    APPearance CLEAR (*)    Hgb urine dipstick MODERATE (*)    Ketones, ur 5 (*)    All other components within normal limits  CULTURE, BLOOD (ROUTINE X 2)  CULTURE, BLOOD (ROUTINE X 2)  URINE CULTURE  LACTIC ACID, PLASMA  LACTIC ACID, PLASMA   ____________________________________________   EKG  interpreted by me Sinus bradycardia rate of 42, left axis, normal intervals. Normal QRS ST segments and T waves.  ____________________________________________    RADIOLOGY  Dg Chest 2 View  Result Date: 10/07/2017 CLINICAL DATA:  Chills.  History of chronic lymphocytic leukemia. EXAM: CHEST  2 VIEW COMPARISON:  CT scan of July 21, 2017. Radiographs of October 11, 2015. FINDINGS: The heart size and mediastinal contours are within normal limits. Both lungs are clear. No pneumothorax or pleural effusion is noted. The visualized skeletal structures are unremarkable. IMPRESSION: No active cardiopulmonary disease. Electronically Signed   By: Marijo Conception, M.D.   On: 10/07/2017 14:12    ____________________________________________   PROCEDURES Procedures  ____________________________________________   DIFFERENTIAL DIAGNOSIS  pneumonia, urinary tract infection, dehydration, CLL  CLINICAL IMPRESSION / ASSESSMENT AND PLAN / ED COURSE  Pertinent labs & imaging results that were available during my care of the patient were reviewed by me and considered in my medical decision making (see chart for details).   patient presents with malaise fatigue and chills. No acute pain anywhere. No syncope. Clinically appears to be dehydrated which will treat with IV fluids. Plan to do screening workup with labs, chest x-ray urinalysis. Exam is benign and reassuring, vital signs are unremarkable. Patient has baseline bradycardia.  Clinical Course as of Oct 07 1610  Thu Oct 07, 2017  1439 Labs, cxr at baseline  [PS]     Clinical Course User Index [PS] Carrie Mew, MD     ----------------------------------------- 4:13 PM on 10/07/2017 -----------------------------------------  Workup negative. I discussed with the patient the need to follow up with oncology if we don't find a cause for his symptoms today as it may be related to his CLL. His leukocytosis is significantly elevated by his chronic baseline. Does not appear to have converted to an acute crisis. He stable for discharge home to follow-up. no evidence of meningitis or encephalitis, patient is not septic.  ____________________________________________   FINAL CLINICAL IMPRESSION(S) / ED DIAGNOSES    Final diagnoses:  Malaise and fatigue  CLL (chronic lymphocytic leukemia) (HCC)      New Prescriptions   No medications on file     Portions of this note were generated with dragon dictation software. Dictation errors may occur despite best attempts at proofreading.    Carrie Mew, MD 10/07/17 [phone]

## 2017-10-07 NOTE — ED Triage Notes (Signed)
Pt loss appetite yesterday. Started after beginning new med for depression per pt. Also started on another new med but he is not sure what it is.  Has had some chills but no other symptoms or pain per pt.  Ambulatory to triage without difficulty. NAD.  VSS.  No fevers at home.

## 2017-10-07 NOTE — ED Notes (Signed)
Pt signed esignature  D/c instr to pt.  Pt alert.

## 2017-10-08 ENCOUNTER — Ambulatory Visit (INDEPENDENT_AMBULATORY_CARE_PROVIDER_SITE_OTHER): Payer: BLUE CROSS/BLUE SHIELD

## 2017-10-08 DIAGNOSIS — Z23 Encounter for immunization: Secondary | ICD-10-CM | POA: Diagnosis not present

## 2017-10-08 LAB — URINE CULTURE: CULTURE: NO GROWTH

## 2017-10-12 LAB — CULTURE, BLOOD (ROUTINE X 2)
CULTURE: NO GROWTH
CULTURE: NO GROWTH

## 2017-10-18 DIAGNOSIS — R55 Syncope and collapse: Secondary | ICD-10-CM | POA: Diagnosis not present

## 2017-10-19 ENCOUNTER — Encounter: Payer: Self-pay | Admitting: Emergency Medicine

## 2017-10-19 ENCOUNTER — Emergency Department
Admission: EM | Admit: 2017-10-19 | Discharge: 2017-10-19 | Disposition: A | Discharge status: Home / Self Care | Payer: BLUE CROSS/BLUE SHIELD | Attending: Emergency Medicine | Admitting: Emergency Medicine

## 2017-10-19 DIAGNOSIS — F17210 Nicotine dependence, cigarettes, uncomplicated: Secondary | ICD-10-CM | POA: Diagnosis not present

## 2017-10-19 DIAGNOSIS — R55 Syncope and collapse: Secondary | ICD-10-CM

## 2017-10-19 DIAGNOSIS — Z79899 Other long term (current) drug therapy: Secondary | ICD-10-CM | POA: Diagnosis not present

## 2017-10-19 DIAGNOSIS — E860 Dehydration: Secondary | ICD-10-CM

## 2017-10-19 DIAGNOSIS — D649 Anemia, unspecified: Secondary | ICD-10-CM | POA: Insufficient documentation

## 2017-10-19 DIAGNOSIS — E876 Hypokalemia: Secondary | ICD-10-CM | POA: Diagnosis not present

## 2017-10-19 DIAGNOSIS — E785 Hyperlipidemia, unspecified: Secondary | ICD-10-CM | POA: Diagnosis not present

## 2017-10-19 LAB — CBC
HEMATOCRIT: 35.4 % — AB (ref 40.0–52.0)
HEMOGLOBIN: 11.5 g/dL — AB (ref 13.0–18.0)
MCH: 24.5 pg — ABNORMAL LOW (ref 26.0–34.0)
MCHC: 32.5 g/dL (ref 32.0–36.0)
MCV: 75.4 fL — ABNORMAL LOW (ref 80.0–100.0)
Platelets: 229 10*3/uL (ref 150–440)
RBC: 4.69 MIL/uL (ref 4.40–5.90)
RDW: 15.8 % — ABNORMAL HIGH (ref 11.5–14.5)
WBC: 26.6 10*3/uL — ABNORMAL HIGH (ref 3.8–10.6)

## 2017-10-19 LAB — BASIC METABOLIC PANEL
ANION GAP: 7 (ref 5–15)
BUN: 15 mg/dL (ref 6–20)
CO2: 25 mmol/L (ref 22–32)
Calcium: 9.1 mg/dL (ref 8.9–10.3)
Chloride: 105 mmol/L (ref 101–111)
Creatinine, Ser: 0.95 mg/dL (ref 0.61–1.24)
GLUCOSE: 101 mg/dL — AB (ref 65–99)
POTASSIUM: 3.6 mmol/L (ref 3.5–5.1)
Sodium: 137 mmol/L (ref 135–145)

## 2017-10-19 LAB — TROPONIN I

## 2017-10-19 MED ORDER — SODIUM CHLORIDE 0.9 % IV BOLUS (SEPSIS)
1000.0000 mL | Freq: Once | INTRAVENOUS | Status: AC
  Administered 2017-10-19: 1000 mL via INTRAVENOUS

## 2017-10-19 NOTE — ED Triage Notes (Signed)
Pt arrived to the ED via EMS from work to be checked after having a syncopal episode at work. Pt reports that he is a cancer Pt taking chemo and that he has experienced syncopal episodes in the past with the last one being last week. Pt is AOx4 in no apparent distress.

## 2017-10-19 NOTE — Discharge Instructions (Signed)

## 2017-10-19 NOTE — ED Provider Notes (Signed)
[facility] Emergency Department Provider Note ____________________________________________   None    (approximate)  I have reviewed the triage vital signs and the nursing notes.   HISTORY  Chief Complaint Loss of Consciousness    HPI [name] is a 66 y.o. male history of CLL,  Patient presents today after he passed out.  Patient reports he was at work, he was in the break room, while standing preparing something to eat he began to feel lightheaded, then moved to the couch where he briefly passed out.  He did not fall or strike his head.  Reports he then came to and was able to sit up, but felt very lightheaded and sweaty for a bit.  He now reports feeling fine, reports he felt rather ill for about 4-5 minutes with nausea  No pain, he does however report that he feels "hungry".  Reports he is passed out in the past and has been told possibly due to dehydration a couple weeks ago.  Never had any chest pain or trouble breathing.  Did not feel his heart racing.  No shortness of breath.  No headache, no neck pain.  No fevers or chills.  Does report that he has not eaten well for several months, he also reports that he does not stay hydrated and not anything to eat or drink this evening.  He did have a small amount of cereal and a hamburger for lunch, his wife reports she also thinks he does not drink enough water and was concerned he may be dehydrated  Past Medical History:  Diagnosis Date  . Anemia 2008  . Arthritis   . 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 Hyperlipidemia   . Peripheral vascular disease [facility])     Patient Active Problem List   Diagnosis Date Noted  . Depression, recurrent ([place]) 09/29/2017  . Encounter to establish care 08/20/2017  . Fatigue 07/23/2017  . Coronary atherosclerosis 10/14/2016  . Emphysema lung ([place]) 10/14/2016  . BPH (benign prostatic hyperplasia) 10/14/2016  . Peripheral vascular disease  ([place]) 10/14/2016  . Hyperlipidemia 10/14/2016  . Weight loss 06/21/2016  . Microcytic anemia 06/15/2015  . 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 COLONOSCOPY    . COLONOSCOPY WITH PROPOFOL N/A 07/23/2015   Procedure: COLONOSCOPY WITH PROPOFOL;  Surgeon: [name], MD;  Location[facility] ENDOSCOPY;  Service: Endoscopy;  Laterality: N/A;  . ESOPHAGOGASTRODUODENOSCOPY (EGD) WITH PROPOFOL N/A 08/20/2015   Procedure: ESOPHAGOGASTRODUODENOSCOPY (EGD) WITH PROPOFOL;  Surgeon: [name], MD;  Location: [facility] ENDOSCOPY;  Service: Endoscopy;  Laterality: N/A;  . lymp node removal Right 2011   arm   left arm 2015  . LYMPH NODE BIOPSY  08/2014    Prior to Admission medications   Medication Sig Start Date End Date Taking? Authorizing Provider  cyclobenzaprine (FLEXERIL) 5 MG tablet Take 1 tablet (5 mg total) by mouth 3 (three) times daily as needed for muscle spasms. 08/19/17   Earlie Server, MD  FLUoxetine (PROZAC) 20 MG capsule Take 1 capsule (20 mg total) by mouth every morning. 09/29/17   [name], FNP  gabapentin (NEURONTIN) 300 MG capsule Take 1 capsule (300 mg total) by mouth 3 (three) times daily. 09/02/17   Earlie Server, MD  HYDROcodone-acetaminophen (NORCO) 5-325 MG tablet Take 1-2 tablets by mouth every 6 (six) hours as needed for moderate pain. 08/12/17   [name], MD  IMBRUVICA 420 MG TABS  08/09/17   [provider]  MYRBETRIQ 25 MG TB24 tablet  07/30/17   [provider]  pravastatin (PRAVACHOL) 40 MG tablet Take 1 tablet (40 mg total) by mouth daily. 09/29/17   [name], FNP    Allergies Latex  Family History  Problem Relation Age of Onset  . Kidney cancer Neg Hx   . Bladder Cancer Neg Hx   . Prostate cancer Neg Hx     Social History Social History  Substance Use Topics  . Smoking status: Current Some Day Smoker    Types: Cigars  . Smokeless tobacco:  Never Used  . Alcohol use No    Review of Systems Constitutional: No fever/chills Eyes: No visual changes. ENT: No sore throat. Cardiovascular: Denies chest pain. Respiratory: Denies shortness of breath. Gastrointestinal: No abdominal pain.  No vomiting.  No diarrhea.  No constipation. Genitourinary: Negative for dysuria. Musculoskeletal: Negative for back pain. Skin: Negative for rash. Neurological: Negative for headaches, focal weakness or numbness.    ____________________________________________   PHYSICAL EXAM:  VITAL SIGNS: ED Triage Vitals  Enc Vitals Group     BP 10/19/17 0109 126/75     Pulse Rate 10/19/17 0109 (!) 50     Resp 10/19/17 0109 18     Temp 10/19/17 0109 97.8 F (36.6 C)     Temp Source 10/19/17 0109 Oral     SpO2 10/19/17 0106 96 %     Weight 10/19/17 0112 135 lb (61.2 kg)     Height 10/19/17 0112 5\' 8"  (1.727 m)     Head Circumference --      Peak Flow --      Pain Score 10/19/17 0108 6     Pain Loc --      Pain Edu? --      Excl. in [place]? --     Constitutional: Alert and oriented. Well appearing and in no acute distress. Eyes: Conjunctivae are normal. Head: Atraumatic. Nose: No congestion/rhinnorhea. Mouth/Throat: Mucous membranes are moist. Neck: No stridor.  No cervical tenderness. Cardiovascular: Normal rate, regular rhythm. Grossly normal heart sounds.  Good peripheral circulation. Respiratory: Normal respiratory effort.  No retractions. Lungs CTAB. Gastrointestinal: Soft and nontender. No distention. Musculoskeletal: No lower extremity tenderness nor edema.  Moves all extremities 5 out of 5 strength. Neurologic:  Normal speech and language. No gross focal neurologic deficits are appreciated.  Skin:  Skin is warm, dry and intact. No rash noted. Psychiatric: Mood and affect are normal. Speech and behavior are normal.  ____________________________________________   LABS (all labs ordered are listed, but only abnormal results are  displayed)  Labs Reviewed  BASIC METABOLIC PANEL - Abnormal; Notable for the following:       Result Value   Glucose, Bld 101 (*)    All other components within normal limits  CBC - Abnormal; Notable for the following:    WBC 26.6 (*)    Hemoglobin 11.5 (*)    HCT 35.4 (*)    MCV 75.4 (*)    MCH 24.5 (*)    RDW 15.8 (*)    All other components within normal limits  TROPONIN I  URINALYSIS, COMPLETE (UACMP) WITH MICROSCOPIC  CBG MONITORING, ED   ____________________________________________  EKG  EKG reviewed and interpreted by me at 1:11 AM Heart rate 50 QRS 90 QT 460, QTc WNL Sinus bradycardia, no acute ischemic changes noted, a possible U wave is seen in V5 ____________________________________________  RADIOLOGY  No  indication for imaging noted at this time ____________________________________________   PROCEDURES  Procedure(s) performed: None  Procedures  Critical Care performed: No  ____________________________________________   INITIAL IMPRESSION / ASSESSMENT AND PLAN / ED COURSE  Pertinent labs & imaging results that were available during my care of the patient were reviewed by me and considered in my medical decision making (see chart for details).  Patient presents for syncopal episode.  Has CLL, reports he is recently planning to change his chemo regimen.  He reports he had similar episodes of syncope in the past.  Likely had vasovagal syncope or orthostatic syncope this evening based on the clinical history.  Denies any cardiac or pulmonary symptoms.  No chest pain, no tachycardia, no hypoxia, no evidence to support PE by clinical exam.  No symptoms of acute coronary syndrome are noted.  Plan to hydrate him generously, then recheck feel how he is if his lab work is reassuring and he feels improved likely discharge to home.  Clinical Course as of Oct 19 525  Tue Oct 19, 2017  0317 Chronic elevation WBC: (!) 26.6 [MQ]  0349 Patient resting comfortably,  IV fluids running.  Reports he feels well at this time without concern.  [MQ]    Clinical Course User Index [MQ] Delman Kitten, MD    ----------------------------------------- 5:26 AM on 10/19/2017 -----------------------------------------  Patient has completed fluids, ambulatory with no distress or concerns.  Will discharge patient home, encouraged hydration with careful syncope precautions given.  Patient will follow up with primary. Return precautions and treatment recommendations and follow-up discussed with the patient who is agreeable with the plan.  Family driving him home. ____________________________________________   FINAL CLINICAL IMPRESSION(S) / ED DIAGNOSES  Final diagnoses:  Syncope and collapse  Dehydration      NEW MEDICATIONS STARTED DURING THIS VISIT:  New Prescriptions   No medications on file     Note:  This document was prepared using Dragon voice recognition software and may include unintentional dictation errors.     Delman Kitten, MD 10/19/17 [phone]

## 2017-10-28 ENCOUNTER — Encounter: Payer: Self-pay | Admitting: Family

## 2017-10-28 ENCOUNTER — Ambulatory Visit (INDEPENDENT_AMBULATORY_CARE_PROVIDER_SITE_OTHER): Payer: BLUE CROSS/BLUE SHIELD | Admitting: Family

## 2017-10-28 VITALS — BP 100/64 | HR 54 | Temp 97.6°F | Ht 68.0 in | Wt 139.0 lb

## 2017-10-28 DIAGNOSIS — F339 Major depressive disorder, recurrent, unspecified: Secondary | ICD-10-CM

## 2017-10-28 DIAGNOSIS — R001 Bradycardia, unspecified: Secondary | ICD-10-CM

## 2017-10-28 DIAGNOSIS — R0789 Other chest pain: Secondary | ICD-10-CM | POA: Insufficient documentation

## 2017-10-28 NOTE — Patient Instructions (Addendum)
Pleasure seeing you as always.   While we await seeing cardiology, please stay very vigilant and if ANY symptoms of dizziness, shortness of breath, fatigue- please go to emergency room  [place] water

## 2017-10-28 NOTE — Assessment & Plan Note (Addendum)
Dizziness resolved. Pleased patient feels so well. Episode does agree with vasovagal syncope, likely worsened by  dehydration. He does report he had a history of bradycardia and was told from Dr. 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 We jointly agreed that with likely chemotherapy to start at some point, and with his recent syncopal episode, further evaluation of bradycardia appropriate. Consult with cardiology placed. Advised very close vigilance and return to emergency room if any further episodes.

## 2017-10-28 NOTE — Progress Notes (Signed)
Pre visit review using our clinic review tool, if applicable. No additional management support is needed unless otherwise documented below in the visit note. 

## 2017-10-28 NOTE — Progress Notes (Signed)
Subjective:    Patient ID: [name], male    DOB: 09-10-1951, 66 y.o.   MRN: [id number]  CC: [name] is a 66 y.o. male who presents today for follow up.   HPI: Depression- 'feels good.' No longer on Proza and had loss of appetite. Fatigue has improved.   appetite has improved. Weight stable.   Has had 2 recent emergency room visits 10/18 and then again 10 /30. The first, he presented with chief complaint of appetite loss, chills. He was treated for dehydration with IV fluids and 'felt better.' Chest x-ray negative for acute disease. Urine culture negative.  During second emergency room visit for which he presented after passing out, patient was more anemic than had been 12 days prior. At that time, suspect vasovagal syncope, or orthostatic syncope. Heart rate on EKG at that time was 47. Troponin negative 1  Notes when passed out, he had mowed the yard prior to heading to work, and didn't drink water or eat. Got to work and 'felt lightheaded'.   H/o CLL- sees Dr Tasia Catchings in December; hasn't started chemo as of yet.   Bradycardia- notes heart rate 'has always been low'. Notes a stress test with Dr Clayborn Bigness. Notes that stress     HISTORY:  Past Medical History:  Diagnosis Date  . Anemia 2008  . Arthritis   . Chronic lymphocytic leukemia (CLL), B-cell ([place]) 06/15/2015  . Hyperlipidemia   . Peripheral vascular disease [facility])    Past Surgical History:  Procedure Laterality Date  . COLONOSCOPY    . lymp node removal Right 2011   arm   left arm 2015  . LYMPH NODE BIOPSY  08/2014   Family History  Problem Relation Age of Onset  . Kidney cancer Neg Hx   . Bladder Cancer Neg Hx   . Prostate cancer Neg Hx     Allergies: Latex Current Outpatient Medications on File Prior to Visit  Medication Sig Dispense Refill  . cyclobenzaprine (FLEXERIL) 5 MG tablet Take 1 tablet (5 mg total) by mouth 3 (three) times daily as needed for muscle spasms. 90 tablet 0  . gabapentin (NEURONTIN)  300 MG capsule Take 1 capsule (300 mg total) by mouth 3 (three) times daily. 90 capsule 2  . HYDROcodone-acetaminophen (NORCO) 5-325 MG tablet Take 1-2 tablets by mouth every 6 (six) hours as needed for moderate pain. 30 tablet 0  . IMBRUVICA 420 MG TABS   0  . MYRBETRIQ 25 MG TB24 tablet   0  . pravastatin (PRAVACHOL) 40 MG tablet Take 1 tablet (40 mg total) by mouth daily. 90 tablet 2   No current facility-administered medications on file prior to visit.     Social History   Tobacco Use  . Smoking status: Current Some Day Smoker    Types: Cigars  . Smokeless tobacco: Never Used  Substance Use Topics  . Alcohol use: No  . Drug use: No    Review of Systems  Constitutional: Negative for chills and fever.  Respiratory: Negative for cough.   Cardiovascular: Negative for chest pain and palpitations.  Gastrointestinal: Negative for nausea and vomiting.  Neurological: Negative for dizziness, syncope (no further episodes) and headaches.      Objective:    BP 100/64   Pulse (!) 54   Temp 97.6 F (36.4 C) (Oral)   Ht 5\' 8"  (1.727 m)   Wt 139 lb (63 kg)   SpO2 96%   BMI 21.13 kg/m  BP Readings from  Last 3 Encounters:  10/28/17 100/64  10/19/17 114/70  10/07/17 (!) 144/82   Wt Readings from Last 3 Encounters:  10/28/17 139 lb (63 kg)  10/19/17 135 lb (61.2 kg)  09/29/17 139 lb 6.4 oz (63.2 kg)    Physical Exam  Constitutional: He appears well-developed and well-nourished.  Cardiovascular: Regular rhythm and normal heart sounds.  Pulmonary/Chest: Effort normal and breath sounds normal. No respiratory distress. He has no wheezes. He has no rhonchi. He has no rales.  Neurological: He is alert.  Skin: Skin is warm and dry.  Psychiatric: He has a normal mood and affect. His speech is normal and behavior is normal.  Vitals reviewed.      Assessment & Plan:   Problem List Items Addressed This Visit      Other   Depression, recurrent ([place])    Doing well without the  Prozac. Weight stable. We'll monitor      Bradycardia - Primary    Dizziness resolved. Pleased patient feels so well. Episode does agree with vasovagal syncope, likely worsened by  dehydration. He does report he had a history of bradycardia and was told from Dr. 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 We jointly agreed that with likely chemotherapy to start at some point, and with his recent syncopal episode, further evaluation of bradycardia appropriate. Consult with cardiology placed. Advised very close vigilance and return to emergency room if any further episodes.      Relevant Orders   Ambulatory referral to Cardiology       I have discontinued Tran Bazin's FLUoxetine. I am also having him maintain his MYRBETRIQ, HYDROcodone-acetaminophen, cyclobenzaprine, gabapentin, IMBRUVICA, and pravastatin.   No orders of the defined types were placed in this encounter.   Return precautions given.   Risks, benefits, and alternatives of the medications and treatment plan prescribed today were discussed, and patient expressed understanding.   Education regarding symptom management and diagnosis given to patient on AVS.  Continue to follow with Joseph Hawthorne, FNP for routine health maintenance.   [name] and I agreed with plan.   Joseph Paris, FNP

## 2017-10-28 NOTE — Assessment & Plan Note (Signed)
Doing well without the Prozac. Weight stable. We'll monitor

## 2017-11-04 IMAGING — CR DG CHEST 2V
1 series · 2 of 2 positions shown · non-contrast
Comparison: CT scan of July 21, 2017. Radiographs October 11, 2015.

CLINICAL DATA: Chills.  History of chronic lymphocytic leukemia.

EXAM:
CHEST  2 VIEW

[Series 1: dg chest 2 view · 0.14mm/px · 2 of 2 slices shown]
[im 1/2]
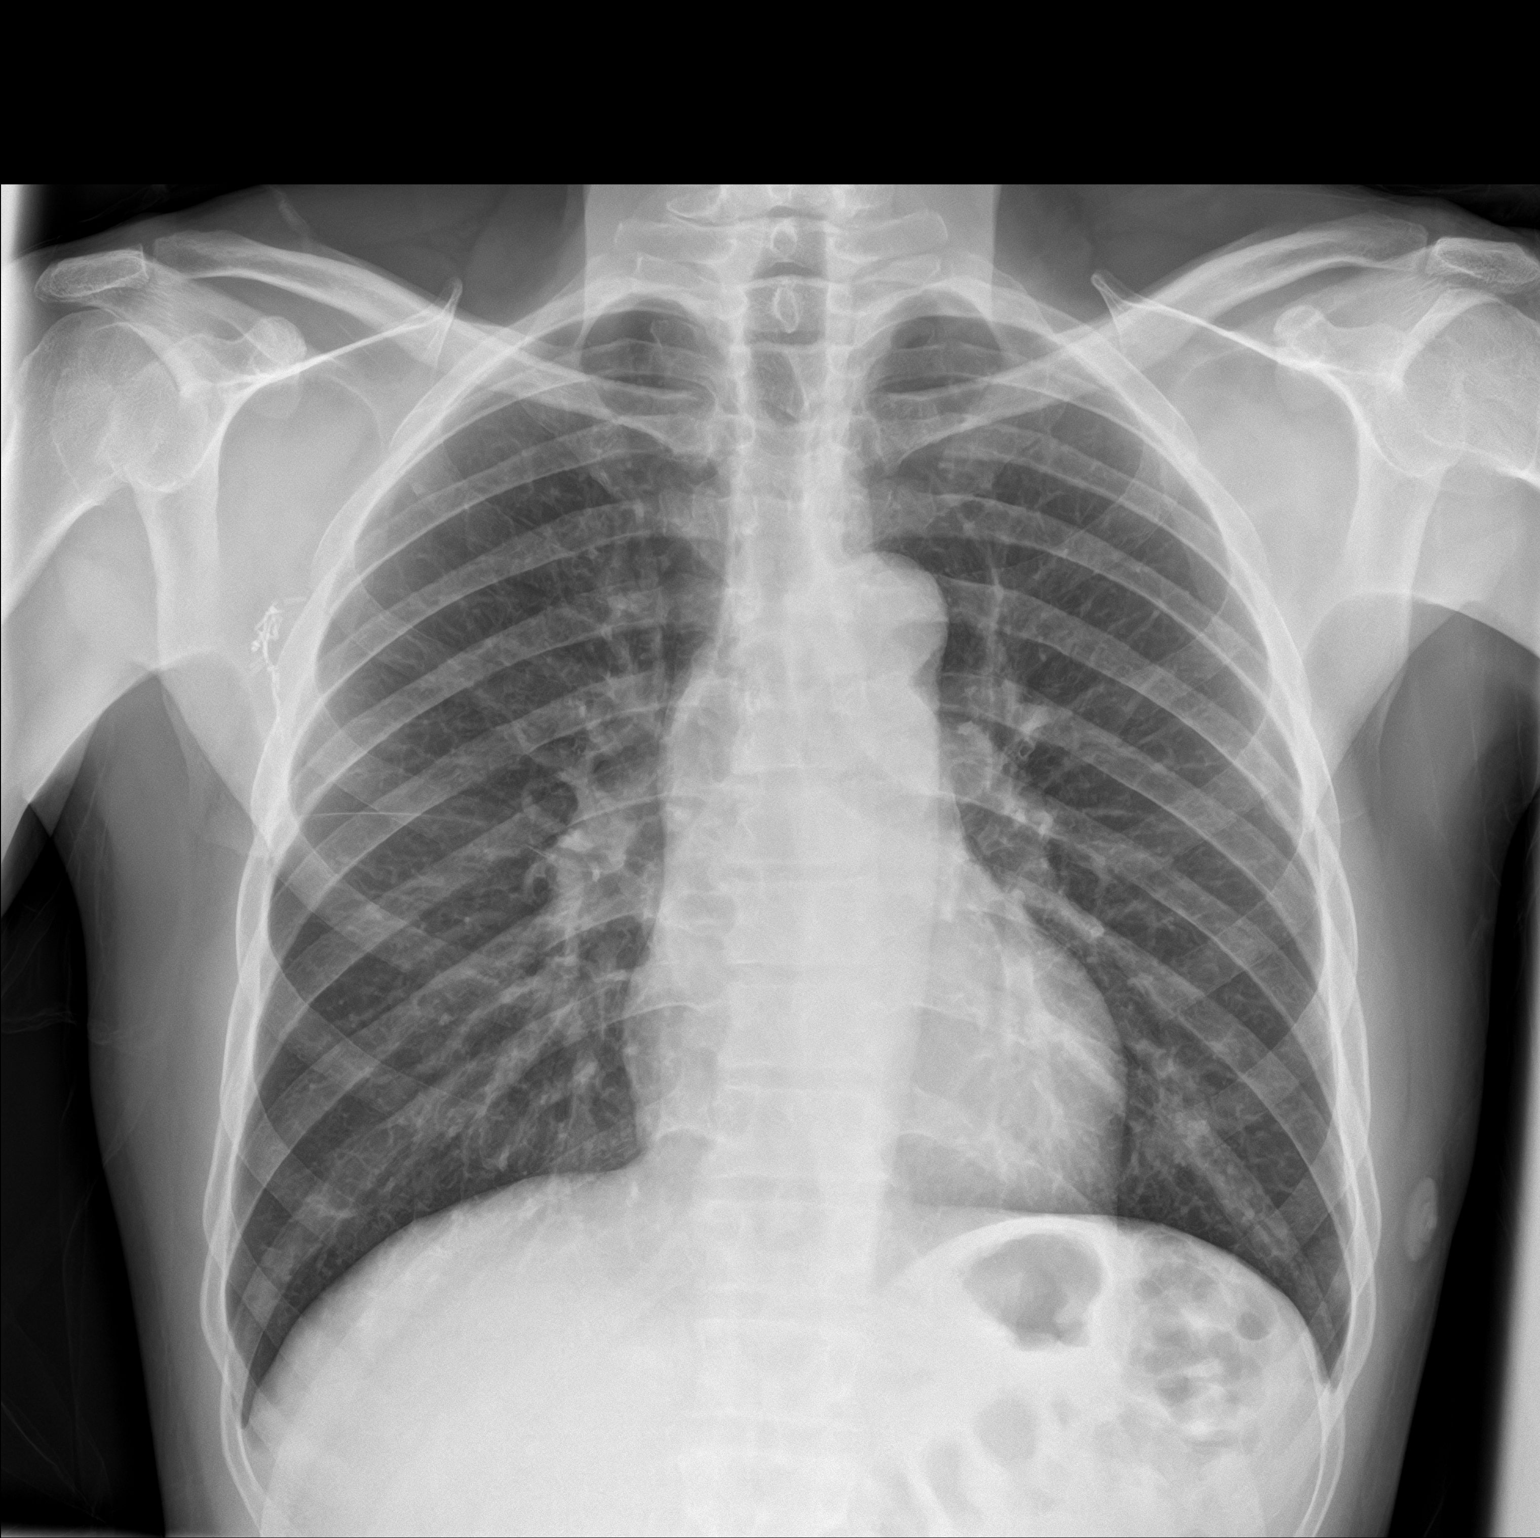
[im 2/2]
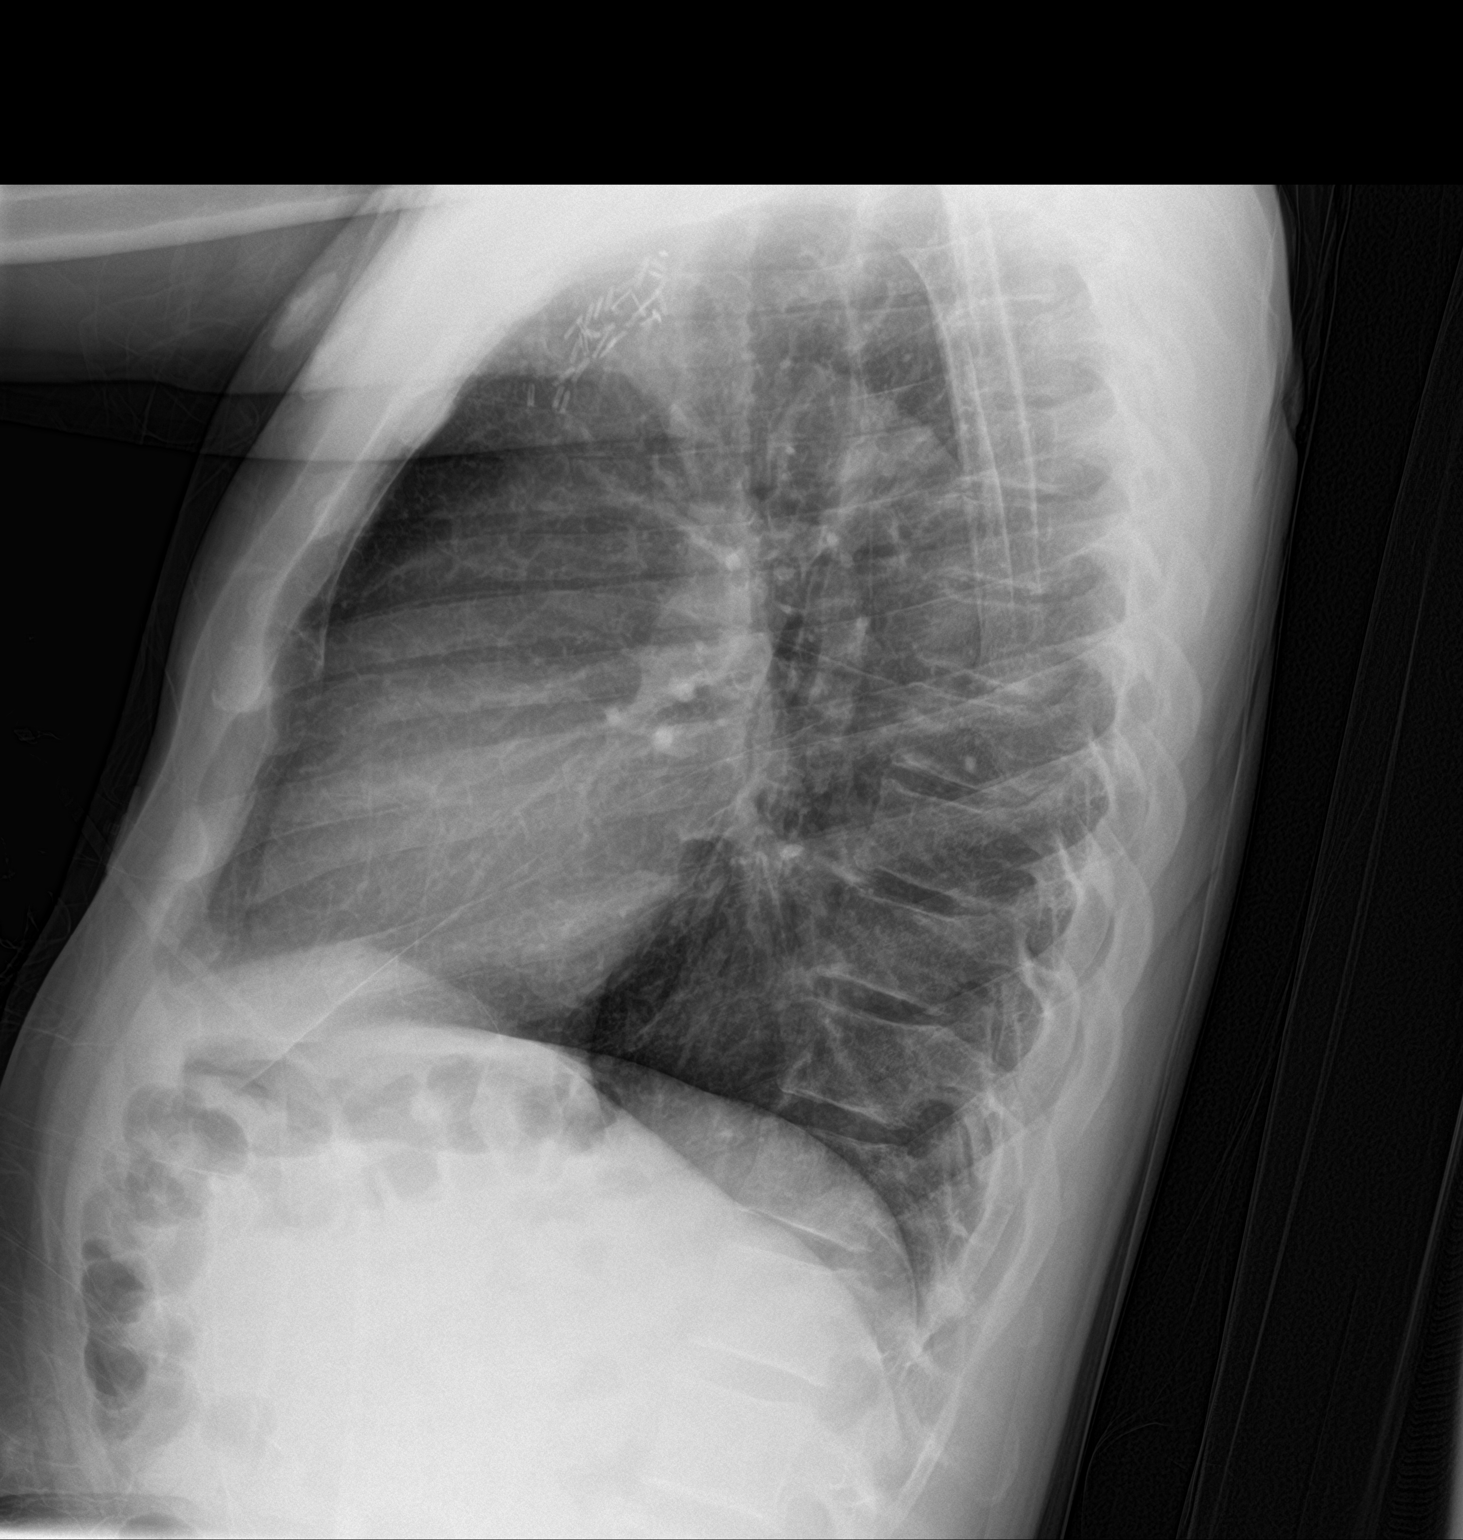

[2 of 2 positions shown; findings below may reference images not displayed]

FINDINGS: The heart size and mediastinal contours are within normal limits.
Both lungs are clear. No pneumothorax or pleural effusion is noted.
The visualized skeletal structures are unremarkable.
IMPRESSION: No active cardiopulmonary disease.

## 2017-11-29 ENCOUNTER — Inpatient Hospital Stay: Payer: BLUE CROSS/BLUE SHIELD | Attending: Oncology

## 2017-11-29 ENCOUNTER — Ambulatory Visit: Payer: BLUE CROSS/BLUE SHIELD

## 2017-12-02 ENCOUNTER — Inpatient Hospital Stay: Payer: BLUE CROSS/BLUE SHIELD | Admitting: Oncology

## 2017-12-02 NOTE — Progress Notes (Deleted)
[facility] day:  12/02/17    Chief Complaint: [name] is a 66 y.o. 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  HPI:  The patient was last seen in the medical onocology clinic on 12/25/2016. Patient reports feeling profound fatigue, lack of appetite, weight loss. He weight 128 pounds today, comparing to 137 pounds when he was here in January 2018.  He met with [name], registered dietitian on 01/04/2017. Patient used to follow up with Dr.[name].  He missed his  follow-up with Dr. Mike Gip.   Treatment history: He originally presented with hemolytic anemia in 01/2014.  He was treated with prednisone. With taper of prednisone, his hemolysis returned.I think it's only positives are Per Dr.[name]'s note, patient received 4 weekly cycles of Rituxan (10/02/2014- 10/23/2014).   07/23/2017 Decision was made to start Ibrutinib as patient is very symptomatic (fatigue and weight loss, lack of appetite). Patient did not show up at the chemo education class.   INTERVAL HISTORY Today patient complains persistent neck pain.  His weight fluctuated. Today he has gain a few pounds.   Past Medical History:  Diagnosis Date  . Anemia 2008  . Arthritis   . Chronic lymphocytic leukemia (CLL), B-cell ([place]) 06/15/2015  . Hyperlipidemia   . Peripheral vascular disease [facility])     Past Surgical History:  Procedure Laterality Date  . COLONOSCOPY    . COLONOSCOPY WITH PROPOFOL N/A 07/23/2015   Procedure: COLONOSCOPY WITH PROPOFOL;  Surgeon: [name], MD;  Location: [facility] ENDOSCOPY;  Service: Endoscopy;  Laterality: N/A;  . ESOPHAGOGASTRODUODENOSCOPY (EGD) WITH PROPOFOL N/A 08/20/2015   Procedure: ESOPHAGOGASTRODUODENOSCOPY (EGD) WITH PROPOFOL;  Surgeon: [name], MD;  Location: [facility] ENDOSCOPY;  Service: Endoscopy;  Laterality: N/A;  . lymp node removal Right 2011   arm   left arm 2015   . LYMPH NODE BIOPSY  08/2014    Family History  Problem Relation Age of Onset  . Kidney cancer Neg Hx   . Bladder Cancer Neg Hx   . Prostate cancer Neg Hx     Social History:  reports that he has been smoking cigars.  he has never used smokeless tobacco. He reports that he does not drink alcohol or use drugs.  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  Allergies:  Allergies  Allergen Reactions  . Latex Rash    Current Medications: Ibuprofen PRN Review of Systems:  Review of Systems  Constitutional: Positive for malaise/fatigue and weight loss.  HENT: Negative.   Eyes: Negative.   Respiratory: Negative.   Cardiovascular: Negative.   Gastrointestinal: Negative.   Genitourinary: Negative.   Musculoskeletal: Positive for neck pain.       Left-sided neck pain improved  Skin: Negative.   Neurological: Positive for weakness.  Endo/Heme/Allergies: Negative.   Psychiatric/Behavioral: Negative.    Physical Exam: There were no vitals taken for this visit. GENERAL:  no acute distress. MENTAL STATUS:  Alert and oriented to person, place and time. HEAD:   Alopecia.    Normocephalic, atraumatic, face symmetric, no Cushingoid features. EYES:  Pupils equal round and reactive to light and accomodation.  No conjunctivitis or scleral icterus. ENT:  Oropharynx clear without lesion.  Dentures.  Tongue normal. Mucous membranes moist.  RESPIRATORY:  Clear to auscultation without rales, wheezes or rhonchi. CARDIOVASCULAR:  Regular rate and rhythm  without murmur, rub or gallop. ABDOMEN:  Soft, non-tender, with active bowel sounds, and no hepatosplenomegaly.  No masses. SKIN:  No rashes, ulcers or lesions. EXTREMITIES: No edema, no skin discoloration or tenderness.  No palpable cords. LYMPH NODES:  right supraclavicular small mobile lymph node. Axillary lymphadenopathy bilateral,The largest one on the right is about  2cm. NEUROLOGICAL: Unremarkable. PSYCH:  Appropriate.  LABORATORY RESULTS. CBC    Component Value Date/Time   WBC 26.6 (H) 10/19/2017 0115   RBC 4.69 10/19/2017 0115   HGB 11.5 (L) 10/19/2017 0115   HGB 13.0 03/13/2015 0852   HCT 35.4 (L) 10/19/2017 0115   HCT 40.9 03/13/2015 0852   PLT 229 10/19/2017 0115   PLT 217 03/13/2015 0852   MCV 75.4 (L) 10/19/2017 0115   MCV 78 (L) 03/13/2015 0852   MCH 24.5 (L) 10/19/2017 0115   MCHC 32.5 10/19/2017 0115   RDW 15.8 (H) 10/19/2017 0115   RDW 16.0 (H) 03/13/2015 0852   LYMPHSABS 18.8 (H) 10/07/2017 1038   LYMPHSABS 3.9 (H) 03/13/2015 0852   MONOABS 0.7 10/07/2017 1038   MONOABS 0.4 03/13/2015 0852   EOSABS 0.2 10/07/2017 1038   EOSABS 0.4 03/13/2015 0852   BASOSABS 0.0 10/07/2017 1038   BASOSABS 0.1 03/13/2015 0852   CMP     Component Value Date/Time   NA 137 10/19/2017 0115   NA 136 03/13/2015 0852   K 3.6 10/19/2017 0115   K 3.9 03/13/2015 0852   CL 105 10/19/2017 0115   CL 104 03/13/2015 0852   CO2 25 10/19/2017 0115   CO2 26 03/13/2015 0852   GLUCOSE 101 (H) 10/19/2017 0115   GLUCOSE 96 03/13/2015 0852   BUN 15 10/19/2017 0115   BUN 11 03/13/2015 0852   CREATININE 0.95 10/19/2017 0115   CREATININE 1.09 03/13/2015 0852   CALCIUM 9.1 10/19/2017 0115   CALCIUM 9.3 03/13/2015 0852   PROT 7.9 10/07/2017 1038   PROT 8.1 03/13/2015 0852   ALBUMIN 4.2 10/07/2017 1038   ALBUMIN 4.5 03/13/2015 0852   AST 23 10/07/2017 1038   AST 26 03/13/2015 0852   ALT 12 (L) 10/07/2017 1038   ALT 12 (L) 03/13/2015 0852   ALKPHOS 51 10/07/2017 1038   ALKPHOS 57 03/13/2015 0852   BILITOT 1.2 10/07/2017 1038   BILITOT 1.2 03/13/2015 0852   GFRNONAA >60 10/19/2017 0115   GFRNONAA >60 03/13/2015 0852   GFRAA >60 10/19/2017 0115   GFRAA >60 03/13/2015 0852   PATHOLOGY:  11/02/2016 Peripheral blood FISH studies revealed 20% of nuclei positive for loss of 1 ATM signal, 63% of nuclei positive for trisomy 12, and 32% of nuclei positive  for loss of 1 TP53 signal. Results for CCND1/IGH and 13q were normal.  09/13/2014 Left axillary node biopsy on  confirmed B-cell small lymphocytic lymphoma (B-CLL/SLL)   IMAGE STUDIES  02/09/2014 Chest and abdomen CT scan  revealed bulky bilateral axillary adenopathy and bulky intraperitoneal and retroperitoneal adenopathy. 07/11/2014 PET scan  revealed mildly hypermetabolic lymph nodes in the chest, abdomen, and pelvis and mildly hypermetabolic spleen.  06/24/2016 PET scan  revealed bilateral moderate hypermetabolic axillary lymphadenopathy increased in size (1.6 cm compared to 1.3 cm) and metabolism (SUV 3.9 compared to 3.6).  There were bilateral moderate hypermetabolic external iliac lymphadenopathy stable to decreased in metabolism.  There was mesenteric and retroperitoneal lymphadenopathy stable in size and non hypermetabolic.  There were no new sites of hypermetabolic lymphoma.  There were subcentimeter pulmonary nodules stable and below PET resolution. 07/21/2017 CT chest  abdomen pelvis w contrast:  Mild interval increase in bulky mesenteric lymphadenopathy since prior exam. Stable lymphadenopathy in bilateral axillary regions, abdominal retroperitoneum, and bilateral external iliac chains.  PREVIOUS ENDOSCOPY He has slowly progressive microcytic anemia.  Colonoscopy on 07/23/2015 revealed diverticulosis in the sigmoid colon.  EGD on 08/20/2015 was normal. Guaiac cards x 3 were negative in 06/2015.  His diet fluctuates with his appetite.  He denies any melana or hematochezia.   Assessment:  [name] is a 66 y.o. male with B-cell small lymphocytic lymphoma (B-cell CLL/SLL) initially presented with hemolytic anemia in 01/2014, s/p treatment with steroid and Rituximab, now with significant weight loss, lack of appetite and fatigue.   No diagnosis found. Patient does have severe constitutional symptoms, including profound weight loss, lack of appetite and fatigue.   Plan: #Peristent left  side neck and shoulder pain not improving. Decline MRI cervicle spine. Will order CT cervical spine for additional work up.  #Etiology for weight loss was previously explored and findings revealed no clear explanation of his symptoms. His weight has been flucturating Patient feels the same # Fecal study showed did not confirm pancreatic insufficiency.  # Continue to hold ibrutinib as it is not clear that his fatigue weight loss is related to CLL. Patient agrees with plan.  He does not have primary care physician. I refer to [facility].  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  Follow up with me in 3 months.  Total face to face encounter time for this patient visit was 25 min. >50% of the time was  spent in counseling and coordination of care.   Earlie Server, MD, PhD Hematology Oncology [facility] at [facility] Pager- [phone] 12/02/2017

## 2017-12-11 DIAGNOSIS — J0100 Acute maxillary sinusitis, unspecified: Secondary | ICD-10-CM | POA: Diagnosis not present

## 2018-01-11 ENCOUNTER — Ambulatory Visit: Payer: BLUE CROSS/BLUE SHIELD

## 2018-01-11 ENCOUNTER — Encounter: Payer: Self-pay | Admitting: Internal Medicine

## 2018-01-11 ENCOUNTER — Telehealth: Payer: Self-pay

## 2018-01-11 ENCOUNTER — Ambulatory Visit: Payer: BLUE CROSS/BLUE SHIELD | Admitting: Internal Medicine

## 2018-01-11 VITALS — BP 100/60 | HR 55 | Temp 98.3°F | Resp 16 | Ht 68.0 in | Wt 139.2 lb

## 2018-01-11 DIAGNOSIS — G629 Polyneuropathy, unspecified: Secondary | ICD-10-CM | POA: Diagnosis not present

## 2018-01-11 DIAGNOSIS — J441 Chronic obstructive pulmonary disease with (acute) exacerbation: Secondary | ICD-10-CM | POA: Diagnosis not present

## 2018-01-11 DIAGNOSIS — Z1159 Encounter for screening for other viral diseases: Secondary | ICD-10-CM

## 2018-01-11 DIAGNOSIS — M5441 Lumbago with sciatica, right side: Secondary | ICD-10-CM | POA: Diagnosis not present

## 2018-01-11 DIAGNOSIS — Z13818 Encounter for screening for other digestive system disorders: Secondary | ICD-10-CM | POA: Diagnosis not present

## 2018-01-11 DIAGNOSIS — M47812 Spondylosis without myelopathy or radiculopathy, cervical region: Secondary | ICD-10-CM | POA: Diagnosis not present

## 2018-01-11 DIAGNOSIS — M5442 Lumbago with sciatica, left side: Secondary | ICD-10-CM

## 2018-01-11 DIAGNOSIS — G8929 Other chronic pain: Secondary | ICD-10-CM | POA: Diagnosis not present

## 2018-01-11 DIAGNOSIS — R319 Hematuria, unspecified: Secondary | ICD-10-CM

## 2018-01-11 DIAGNOSIS — Z87898 Personal history of other specified conditions: Secondary | ICD-10-CM

## 2018-01-11 DIAGNOSIS — C9190 Lymphoid leukemia, unspecified not having achieved remission: Secondary | ICD-10-CM

## 2018-01-11 DIAGNOSIS — M5412 Radiculopathy, cervical region: Secondary | ICD-10-CM | POA: Diagnosis not present

## 2018-01-11 DIAGNOSIS — I2510 Atherosclerotic heart disease of native coronary artery without angina pectoris: Secondary | ICD-10-CM | POA: Diagnosis not present

## 2018-01-11 DIAGNOSIS — R001 Bradycardia, unspecified: Secondary | ICD-10-CM

## 2018-01-11 DIAGNOSIS — C9110 Chronic lymphocytic leukemia of B-cell type not having achieved remission: Secondary | ICD-10-CM

## 2018-01-11 DIAGNOSIS — E785 Hyperlipidemia, unspecified: Secondary | ICD-10-CM | POA: Diagnosis not present

## 2018-01-11 DIAGNOSIS — C9111 Chronic lymphocytic leukemia of B-cell type in remission: Secondary | ICD-10-CM

## 2018-01-11 DIAGNOSIS — K219 Gastro-esophageal reflux disease without esophagitis: Secondary | ICD-10-CM

## 2018-01-11 LAB — CBC WITH DIFFERENTIAL/PLATELET
Basophils Absolute: 0.1 10*3/uL (ref 0.0–0.1)
Basophils Relative: 0.3 % (ref 0.0–3.0)
EOS PCT: 1.4 % (ref 0.0–5.0)
Eosinophils Absolute: 0.4 10*3/uL (ref 0.0–0.7)
HEMATOCRIT: 36.3 % — AB (ref 39.0–52.0)
Hemoglobin: 11.5 g/dL — ABNORMAL LOW (ref 13.0–17.0)
Lymphs Abs: 19.5 10*3/uL — ABNORMAL HIGH (ref 0.7–4.0)
MCHC: 31.6 g/dL (ref 30.0–36.0)
MCV: 76.7 fl — AB (ref 78.0–100.0)
MONOS PCT: 2.7 % — AB (ref 3.0–12.0)
Monocytes Absolute: 0.7 10*3/uL (ref 0.1–1.0)
NEUTROS ABS: 4.7 10*3/uL (ref 1.4–7.7)
Neutrophils Relative %: 18.4 % — ABNORMAL LOW (ref 43.0–77.0)
PLATELETS: 220 10*3/uL (ref 150.0–400.0)
RBC: 4.73 Mil/uL (ref 4.22–5.81)
RDW: 15.8 % — AB (ref 11.5–15.5)
WBC: 25.2 10*3/uL (ref 4.0–10.5)

## 2018-01-11 LAB — URINALYSIS, ROUTINE W REFLEX MICROSCOPIC
Bilirubin Urine: NEGATIVE
Ketones, ur: NEGATIVE
Leukocytes, UA: NEGATIVE
Nitrite: NEGATIVE
PH: 5.5 (ref 5.0–8.0)
SPECIFIC GRAVITY, URINE: 1.025 (ref 1.000–1.030)
Total Protein, Urine: NEGATIVE
Urine Glucose: NEGATIVE
Urobilinogen, UA: 0.2 (ref 0.0–1.0)

## 2018-01-11 MED ORDER — PREDNISONE 20 MG PO TABS
40.0000 mg | ORAL_TABLET | Freq: Every day | ORAL | 0 refills | Status: DC
Start: 2018-01-11 — End: 2018-04-01

## 2018-01-11 MED ORDER — ALBUTEROL SULFATE HFA 108 (90 BASE) MCG/ACT IN AERS: 1.0000 | INHALATION_SPRAY | Freq: Four times a day (QID) | RESPIRATORY_TRACT | 11 refills | Status: DC | PRN

## 2018-01-11 MED ORDER — PRAVASTATIN SODIUM 40 MG PO TABS: 40.0000 mg | ORAL_TABLET | Freq: Every day | ORAL | 3 refills | Status: DC

## 2018-01-11 MED ORDER — DOXYCYCLINE HYCLATE 100 MG PO TABS
100.0000 mg | ORAL_TABLET | Freq: Two times a day (BID) | ORAL | 0 refills | Status: DC
Start: 2018-01-11 — End: 2018-03-04

## 2018-01-11 MED ORDER — OMEPRAZOLE 20 MG PO CPDR: 20.0000 mg | DELAYED_RELEASE_CAPSULE | Freq: Every day | ORAL | 0 refills | Status: DC

## 2018-01-11 MED ORDER — GABAPENTIN 300 MG PO CAPS: 300.0000 mg | ORAL_CAPSULE | Freq: Three times a day (TID) | ORAL | 1 refills | Status: DC

## 2018-01-11 MED ORDER — MYRBETRIQ 25 MG PO TB24: 25.0000 mg | ORAL_TABLET | Freq: Every day | ORAL | 0 refills | Status: DC

## 2018-01-11 NOTE — Patient Instructions (Signed)
Please try Mucinex DM or Robitussin DM you have to buy this over the counter for cough  F/u in 2-3 weeks sooner if needed   Take care   Chronic Obstructive Pulmonary Disease Chronic obstructive pulmonary disease (COPD) is a long-term (chronic) lung problem. When you have COPD, it is hard for air to get in and out of your lungs. The way your lungs work will never return to normal. Usually the condition gets worse over time. There are things you can do to keep yourself as healthy as possible. Your doctor may treat your condition with:  Medicines.  Quitting smoking, if you smoke.  Rehabilitation. This may involve a team of specialists.  Oxygen.  Exercise and changes to your diet.  Lung surgery.  Comfort measures (palliative care).  Follow these instructions at home: Medicines  Take over-the-counter and prescription medicines only as told by your doctor.  Talk to your doctor before taking any cough or allergy medicines. You may need to avoid medicines that cause your lungs to be dry. Lifestyle  If you smoke, stop. Smoking makes the problem worse. If you need help quitting, ask your doctor.  Avoid being around things that make your breathing worse. This may include smoke, chemicals, and fumes.  Stay active, but remember to also rest.  Learn and use tips on how to relax.  Make sure you get enough sleep. Most adults need at least 7 hours a night.  Eat healthy foods. Eat smaller meals more often. Rest before meals. Controlled breathing  Learn and use tips on how to control your breathing as told by your doctor. Try: ? Breathing in (inhaling) through your nose for 1 second. Then, pucker your lips and breath out (exhale) through your lips for 2 seconds. ? Putting one hand on your belly (abdomen). Breathe in slowly through your nose for 1 second. Your hand on your belly should move out. Pucker your lips and breathe out slowly through your lips. Your hand on your belly should move in  as you breathe out. Controlled coughing  Learn and use controlled coughing to clear mucus from your lungs. The steps are: 1. Lean your head a little forward. 2. Breathe in deeply. 3. Try to hold your breath for 3 seconds. 4. Keep your mouth slightly open while coughing 2 times. 5. Spit any mucus out into a tissue. 6. Rest and do the steps again 1 or 2 times as needed. General instructions  Make sure you get all the shots (vaccines) that your doctor recommends. Ask your doctor about a flu shot and a pneumonia shot.  Use oxygen therapy and therapy to help improve your lungs (pulmonary rehabilitation) if told by your doctor. If you need home oxygen therapy, ask your doctor if you should buy a tool to measure your oxygen level (oximeter).  Make a COPD action plan with your doctor. This helps you know what to do if you feel worse than usual.  Manage any other conditions you have as told by your doctor.  Avoid going outside when it is very hot, cold, or humid.  Avoid people who have a sickness you can catch (contagious).  Keep all follow-up visits as told by your doctor. This is important. Contact a doctor if:  You cough up more mucus than usual.  There is a change in the color or thickness of the mucus.  It is harder to breathe than usual.  Your breathing is faster than usual.  You have trouble sleeping.  You  need to use your medicines more often than usual.  You have trouble doing your normal activities such as getting dressed or walking around the house. Get help right away if:  You have shortness of breath while resting.  You have shortness of breath that stops you from: ? Being able to talk. ? Doing normal activities.  Your chest hurts for longer than 5 minutes.  Your skin color is more blue than usual.  Your pulse oximeter shows that you have low oxygen for longer than 5 minutes.  You have a fever.  You feel too tired to breathe normally. Summary  Chronic  obstructive pulmonary disease (COPD) is a long-term lung problem.  The way your lungs work will never return to normal. Usually the condition gets worse over time. There are things you can do to keep yourself as healthy as possible.  Take over-the-counter and prescription medicines only as told by your doctor.  If you smoke, stop. Smoking makes the problem worse. This information is not intended to replace advice given to you by your health care provider. Make sure you discuss any questions you have with your health care provider. Document Released: 05/25/2008 Document Revised: 05/14/2016 Document Reviewed: 08/03/2013 Elsevier Interactive Patient Education  2017 [employer].

## 2018-01-11 NOTE — Progress Notes (Signed)
Chief Complaint  Patient presents with  . Follow-up   Follow up  1. Recent URI saw urgent care given meds per pt not antibiotics sx's of cough chest congestion, productive white to yellow mucous going on x 3 weeks. He denies fever but has had chills. He is former cig smoker and currently occasionally smokes cigars with CT 07/21/17 with paraseptal emphysema reviewed.   2. He reports chronic neck pain previous MRI cervical abnormal and he recently missed CT neck ordered by Dr. Tasia Catchings. He is still having neck pain and b/l shoulder/arm pain. Also c/o low back pain and numbness in b/l legs when walks after 5 minutes has to take a break and his boss has noticed.  He is no longer taking Flexeril/Norco for pain but did report norco helped with pain 3. CLL saw Dr. Tasia Catchings 08/2017 missed f/u 11/2017 has not been on Imbruvica 420 mg as this was held 08/2017 office visit to make sure no other cause of wt loss per note. Per pt he has gained wt.  4. He needs medication refills of all medications 5. He c/o urinary urgency, hesistancy, pressure 6. He c/o GERD sx's not on any medications currently  05/2018   Review of Systems  Constitutional: Negative for weight loss.  HENT: Positive for hearing loss.   Eyes:       No vision issues   Respiratory: Positive for cough and sputum production.   Cardiovascular: Negative for chest pain.  Gastrointestinal: Positive for abdominal pain and heartburn.  Genitourinary: Positive for urgency.       +hesistency, +pressure   Musculoskeletal: Positive for back pain and neck pain.  Skin: Positive for rash.  Neurological: Positive for speech change.  Psychiatric/Behavioral: Negative for memory loss.   Past Medical History:  Diagnosis Date  . Anemia 2008  . Arthritis   . Cervicalgia   . 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 Tasia Catchings  . Hyperlipidemia   . Low back pain   . Peripheral vascular disease [facility])    Past Surgical History:  Procedure Laterality Date   . COLONOSCOPY    . COLONOSCOPY WITH PROPOFOL N/A 07/23/2015   Procedure: COLONOSCOPY WITH PROPOFOL;  Surgeon: [name], MD;  Location[facility] ENDOSCOPY;  Service: Endoscopy;  Laterality: N/A;  . ESOPHAGOGASTRODUODENOSCOPY (EGD) WITH PROPOFOL N/A 08/20/2015   Procedure: ESOPHAGOGASTRODUODENOSCOPY (EGD) WITH PROPOFOL;  Surgeon: [name], MD;  Location: [facility] ENDOSCOPY;  Service: Endoscopy;  Laterality: N/A;  . lymp node removal Right 2011   arm   left arm 2015  . LYMPH NODE BIOPSY  08/2014   Family History  Problem Relation Age of Onset  . Kidney cancer Neg Hx        lung cancer  . Bladder Cancer Neg Hx   . Prostate cancer Neg Hx    Social History   Socioeconomic History  . Marital status: Married    Spouse name: Not on file  . Number of children: Not on file  . Years of education: Not on file  . Highest education level: Not on file  Social Needs  . Financial resource strain: Not on file  . Food insecurity - worry: Not on file  . Food insecurity - inability: Not on file  . Transportation needs - medical: Not on file  . Transportation needs - non-medical: Not on file  Occupational History  . Not on file  Tobacco Use  . Smoking status: Current Some Day Smoker    Types:  Cigars  . Smokeless tobacco: Never Used  . Tobacco comment: quit cigarettes 30 years ago used to smoke 1 pk last 1 week no FH lung cancer   Substance and Sexual Activity  . Alcohol use: No  . Drug use: No  . Sexual activity: No  Other Topics Concern  . 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       Current Meds  Medication Sig  . gabapentin (NEURONTIN) 300 MG capsule Take 1 capsule (300 mg total) by mouth 3 (three) times daily.  . IMBRUVICA 420 MG TABS   . MYRBETRIQ 25 MG TB24 tablet Take 1 tablet (25 mg total) by mouth daily.  . pravastatin (PRAVACHOL) 40 MG tablet Take 1 tablet (40 mg total) by mouth daily.  . [DISCONTINUED]  cyclobenzaprine (FLEXERIL) 5 MG tablet Take 1 tablet (5 mg total) by mouth 3 (three) times daily as needed for muscle spasms.  . [DISCONTINUED] gabapentin (NEURONTIN) 300 MG capsule Take 1 capsule (300 mg total) by mouth 3 (three) times daily.  . [DISCONTINUED] HYDROcodone-acetaminophen (NORCO) 5-325 MG tablet Take 1-2 tablets by mouth every 6 (six) hours as needed for moderate pain.  . [DISCONTINUED] MYRBETRIQ 25 MG TB24 tablet   . [DISCONTINUED] pravastatin (PRAVACHOL) 40 MG tablet Take 1 tablet (40 mg total) by mouth daily.   Allergies  Allergen Reactions  . Latex Rash   Recent Results (from the past 2160 hour(s))  Basic metabolic panel     Status: Abnormal   Collection Time: 10/19/17  1:15 AM  Result Value Ref Range   Sodium 137 135 - 145 mmol/L   Potassium 3.6 3.5 - 5.1 mmol/L   Chloride 105 101 - 111 mmol/L   CO2 25 22 - 32 mmol/L   Glucose, Bld 101 (H) 65 - 99 mg/dL   BUN 15 6 - 20 mg/dL   Creatinine, Ser 0.95 0.61 - 1.24 mg/dL   Calcium 9.1 8.9 - 10.3 mg/dL   GFR calc non Af Amer >60 >60 mL/min   GFR calc Af Amer >60 >60 mL/min    Comment: (NOTE) The eGFR has been calculated using the CKD EPI equation. This calculation has not been validated in all clinical situations. eGFR's persistently <60 mL/min signify possible Chronic Kidney Disease.    Anion gap 7 5 - 15  CBC     Status: Abnormal   Collection Time: 10/19/17  1:15 AM  Result Value Ref Range   WBC 26.6 (H) 3.8 - 10.6 K/uL   RBC 4.69 4.40 - 5.90 MIL/uL   Hemoglobin 11.5 (L) 13.0 - 18.0 g/dL   HCT 35.4 (L) 40.0 - 52.0 %   MCV 75.4 (L) 80.0 - 100.0 fL   MCH 24.5 (L) 26.0 - 34.0 pg   MCHC 32.5 32.0 - 36.0 g/dL   RDW 15.8 (H) 11.5 - 14.5 %   Platelets 229 150 - 440 K/uL  Troponin I     Status: None   Collection Time: 10/19/17  1:15 AM  Result Value Ref Range   Troponin I <0.03 <0.03 ng/mL   Objective  Body mass index is 21.17 kg/m. Wt Readings from Last 3 Encounters:  01/11/18 139 lb 4 oz (63.2 kg)   10/28/17 139 lb (63 kg)  10/19/17 135 lb (61.2 kg)   Temp Readings from Last 3 Encounters:  01/11/18 98.3 F (36.8 C) (Oral)  10/28/17 97.6 F (36.4 C) (Oral)  10/19/17 97.8 F (36.6 C) (  Oral)   BP Readings from Last 3 Encounters:  01/11/18 100/60  10/28/17 100/64  10/19/17 114/70   Pulse Readings from Last 3 Encounters:  01/11/18 (!) 55  10/28/17 (!) 54  10/19/17 (!) 49   O2 sat room air 97%  Physical Exam  Constitutional: He is oriented to person, place, and time and well-developed, well-nourished, and in no distress.  HENT:  Head: Normocephalic and atraumatic.  Eyes: Conjunctivae are normal. Pupils are equal, round, and reactive to light.  Cardiovascular: Regular rhythm and normal heart sounds. Bradycardia present.  Pulmonary/Chest: Effort normal and breath sounds normal.  Course breath sounds b/l   Neurological: He is alert and oriented to person, place, and time. Gait normal.  Skin: Skin is warm and dry.  Psychiatric: Mood, memory, affect and judgment normal.  Nursing note and vitals reviewed.   Assessment   1. Likely COPD exacerbation with h/o tobacco abuse  2. Chronic neck pain with radicular sxs and low back pain  3. H/o CLL 4. H/o hematuria, urinary sx's urgency/hesistancy  5. GERD  6. HM Plan  1. Doxy bid x 1 week with prednisone  rec smoking cessation  OTC mucinex/robitussion  Prn Albuterol  Reviewed CT 07/2017 with paraseptal emphysema  2. CT neck re ordered pt missed prior CT ordered by Dr. Roxanna Mew low back  F/u in 2-3 weeks  3. Referred back to Dr. Tasia Catchings overdue Repeat CBC today  Will let them refill Imbruvica if appropriate he has been off since 08/2012 I believe   4. Repeat UA today  Refilled Myrbetriq  Established with urology needs to f/u as sch 05/2018  PSA nl 1.15 09/29/17   5. Trial prilosec  At f/u give list of GERD foods   6. Had flu shot  Will need pna 23, Tdap, shingrix in future if has not had   PSA nl 1.15 09/29/17  Check Hep  B/C today HIV neg 07/25/17  Colonoscopy had 07/23/15 diverticulosis   rec smoking cessation    Provider: Dr. Olivia Mackie McLean-Scocuzza-Internal Medicine

## 2018-01-11 NOTE — Telephone Encounter (Signed)
CRITICAL VALUE STICKER  CRITICAL VALUE: WBC 25.2  RECEIVER (on-site recipient of call):Latyoa  DATE & TIME NOTIFIED: 4:17pm  MESSENGER (representative from lab):  MD NOTIFIED: Orland Mustard, MD    TIME OF NOTIFICATION:   RESPONSE:  Orland Mustard, MD

## 2018-01-11 NOTE — Telephone Encounter (Signed)
Reviewed h/o CLL referred back to H/O

## 2018-01-12 ENCOUNTER — Other Ambulatory Visit: Payer: Self-pay | Admitting: Internal Medicine

## 2018-01-12 ENCOUNTER — Telehealth: Payer: Self-pay | Admitting: Radiology

## 2018-01-12 ENCOUNTER — Ambulatory Visit: Payer: BLUE CROSS/BLUE SHIELD

## 2018-01-12 DIAGNOSIS — M542 Cervicalgia: Secondary | ICD-10-CM

## 2018-01-12 DIAGNOSIS — R200 Anesthesia of skin: Secondary | ICD-10-CM

## 2018-01-12 DIAGNOSIS — M47812 Spondylosis without myelopathy or radiculopathy, cervical region: Secondary | ICD-10-CM | POA: Diagnosis not present

## 2018-01-12 LAB — HEPATITIS B SURFACE ANTIBODY, QUANTITATIVE: Hepatitis B-Post: 13 m[IU]/mL (ref >=10)

## 2018-01-12 LAB — HEPATITIS B SURFACE ANTIGEN: Hepatitis B Surface Ag: NONREACTIVE

## 2018-01-12 LAB — HEPATITIS C ANTIBODY
HEP C AB: NONREACTIVE
SIGNAL TO CUT-OFF: 0.02 (ref <1.00)

## 2018-01-12 NOTE — Telephone Encounter (Signed)
Called pt to regarding coming back in for lumbar spine xray. No voicemail set up to leave message. CRM created

## 2018-01-13 NOTE — Telephone Encounter (Signed)
Called pt again to ask pt to come back in for lumbar spine xray. Pt does not have voicemail box set up yet, unable to leave message. PEC ok to speak with pt.

## 2018-01-14 ENCOUNTER — Telehealth: Payer: Self-pay | Admitting: Oncology

## 2018-01-14 NOTE — Telephone Encounter (Signed)
Received inbasket message to r\s missed appt on 12/02/2017. No answer and VM not set-up.

## 2018-01-17 ENCOUNTER — Telehealth: Payer: Self-pay | Admitting: Oncology

## 2018-01-17 NOTE — Telephone Encounter (Signed)
@  nd attempt and Pt didn't answer and VM is not set-up.

## 2018-01-24 ENCOUNTER — Ambulatory Visit: Payer: BLUE CROSS/BLUE SHIELD | Attending: Internal Medicine

## 2018-02-01 ENCOUNTER — Ambulatory Visit: Payer: BLUE CROSS/BLUE SHIELD | Admitting: Internal Medicine

## 2018-02-01 DIAGNOSIS — Z0289 Encounter for other administrative examinations: Secondary | ICD-10-CM

## 2018-02-27 ENCOUNTER — Encounter: Payer: Self-pay | Admitting: Emergency Medicine

## 2018-02-27 ENCOUNTER — Emergency Department
Admission: EM | Admit: 2018-02-27 | Discharge: 2018-02-28 | Disposition: A | Discharge status: Home / Self Care | Payer: BLUE CROSS/BLUE SHIELD | Attending: Emergency Medicine | Admitting: Emergency Medicine

## 2018-02-27 ENCOUNTER — Other Ambulatory Visit: Payer: Self-pay

## 2018-02-27 ENCOUNTER — Emergency Department: Payer: BLUE CROSS/BLUE SHIELD

## 2018-02-27 DIAGNOSIS — Z79899 Other long term (current) drug therapy: Secondary | ICD-10-CM | POA: Insufficient documentation

## 2018-02-27 DIAGNOSIS — J101 Influenza due to other identified influenza virus with other respiratory manifestations: Secondary | ICD-10-CM | POA: Insufficient documentation

## 2018-02-27 DIAGNOSIS — C9111 Chronic lymphocytic leukemia of B-cell type in remission: Secondary | ICD-10-CM | POA: Diagnosis not present

## 2018-02-27 DIAGNOSIS — I2510 Atherosclerotic heart disease of native coronary artery without angina pectoris: Secondary | ICD-10-CM | POA: Diagnosis not present

## 2018-02-27 DIAGNOSIS — F17290 Nicotine dependence, other tobacco product, uncomplicated: Secondary | ICD-10-CM | POA: Diagnosis not present

## 2018-02-27 DIAGNOSIS — R0602 Shortness of breath: Secondary | ICD-10-CM | POA: Diagnosis not present

## 2018-02-27 DIAGNOSIS — R531 Weakness: Secondary | ICD-10-CM | POA: Diagnosis not present

## 2018-02-27 DIAGNOSIS — R0989 Other specified symptoms and signs involving the circulatory and respiratory systems: Secondary | ICD-10-CM | POA: Diagnosis not present

## 2018-02-27 DIAGNOSIS — R0981 Nasal congestion: Secondary | ICD-10-CM | POA: Diagnosis not present

## 2018-02-27 LAB — LACTIC ACID, PLASMA: Lactic Acid, Venous: 0.9 mmol/L (ref 0.5–1.9)

## 2018-02-27 LAB — BASIC METABOLIC PANEL
ANION GAP: 11 (ref 5–15)
BUN: 16 mg/dL (ref 6–20)
CHLORIDE: 106 mmol/L (ref 101–111)
CO2: 19 mmol/L — AB (ref 22–32)
Calcium: 8.8 mg/dL — ABNORMAL LOW (ref 8.9–10.3)
Creatinine, Ser: 1.12 mg/dL (ref 0.61–1.24)
GFR calc non Af Amer: >60 mL/min (ref >60)
Glucose, Bld: 99 mg/dL (ref 65–99)
Potassium: 3.6 mmol/L (ref 3.5–5.1)
Sodium: 136 mmol/L (ref 135–145)

## 2018-02-27 LAB — CBC
HCT: 42.3 % (ref 40.0–52.0)
HEMOGLOBIN: 13.5 g/dL (ref 13.0–18.0)
MCH: 24 pg — AB (ref 26.0–34.0)
MCHC: 31.9 g/dL — ABNORMAL LOW (ref 32.0–36.0)
MCV: 75.2 fL — AB (ref 80.0–100.0)
Platelets: 179 10*3/uL (ref 150–440)
RBC: 5.63 MIL/uL (ref 4.40–5.90)
RDW: 16 % — ABNORMAL HIGH (ref 11.5–14.5)
WBC: 16.2 10*3/uL — ABNORMAL HIGH (ref 3.8–10.6)

## 2018-02-27 LAB — URINALYSIS, COMPLETE (UACMP) WITH MICROSCOPIC
BILIRUBIN URINE: NEGATIVE
Bacteria, UA: NONE SEEN
Glucose, UA: NEGATIVE mg/dL
KETONES UR: NEGATIVE mg/dL
Leukocytes, UA: NEGATIVE
Nitrite: NEGATIVE
PROTEIN: 30 mg/dL — AB
SQUAMOUS EPITHELIAL / LPF: NONE SEEN
Specific Gravity, Urine: 1.028 (ref 1.005–1.030)
pH: 5 (ref 5.0–8.0)

## 2018-02-27 LAB — TROPONIN I

## 2018-02-27 NOTE — ED Notes (Signed)
Pt to the ER for weakness all over since Wednesday. Pt says symptoms began as a cold and he was taking over the counter meds. Pt reports productive cough with clear mucus with some color. Pt denies dizziness. Pt reports coughing so hard he felt a "catch in his back".

## 2018-02-27 NOTE — ED Triage Notes (Signed)
Pt arrives ambulatory to triage with c/o overall weakness and diaphoresis. Pt is a cancer pt but still in the process of finding the origin of his cancer. Pt has been feeling like this Wednesday. Pt appears winded at this time from his walk into the triage room. 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

## 2018-02-28 ENCOUNTER — Other Ambulatory Visit: Payer: Self-pay | Admitting: *Deleted

## 2018-02-28 DIAGNOSIS — D509 Iron deficiency anemia, unspecified: Secondary | ICD-10-CM

## 2018-02-28 LAB — LACTIC ACID, PLASMA: LACTIC ACID, VENOUS: 1 mmol/L (ref 0.5–1.9)

## 2018-02-28 LAB — INFLUENZA PANEL BY PCR (TYPE A & B)
Influenza A By PCR: POSITIVE — AB
Influenza B By PCR: NEGATIVE

## 2018-02-28 MED ORDER — SODIUM CHLORIDE 0.9 % IV BOLUS (SEPSIS)
500.0000 mL | INTRAVENOUS | Status: AC
  Administered 2018-02-28: 500 mL via INTRAVENOUS

## 2018-02-28 NOTE — Discharge Instructions (Signed)
You were diagnosed with the flu (influenza).  You will feel ill for as much as a few weeks.  Please take any prescribed medications as instructed, and you may use over-the-counter Tylenol and/or ibuprofen as needed according to label instructions (unless you have an allergy to either or have been told by your doctor not to take them).  Please make sure to drink plenty of fluids and refer to the included information about rehydration.  Follow up with your physician as instructed above, and return to the Emergency Department (ED) if you are unable to tolerate fluids due to vomiting, have worsening trouble breathing, become extremely tired or difficult to awaken, or if you develop any other symptoms that concern you. 

## 2018-02-28 NOTE — ED Provider Notes (Addendum)
[facility] Emergency Department Provider Note  ____________________________________________   First MD Initiated Contact with Patient 02/28/18 0008     (approximate)  I have reviewed the triage vital signs and the nursing notes.   HISTORY  Chief Complaint Weakness    HPI [name] is a 67 y.o. male with a history of CLL and other medical issues as described below who presents for evaluation of a constellation of symptoms for about 5 days.  He states that his symptoms began as a cold with nasal congestion, runny nose, and sneezing and he has developed a mildly productive cough, shortness of breath with exertion, and a couple of episodes of loose stools.  He has not had much of an appetite over the last few days but has been trying to drink fluids so he does not get dehydrated.  He denies fever/chills, chest pain, shortness of breath, nausea, vomiting, abdominal pain.  He is followed by Dr. Tasia Catchings in the cancer center for CLL but does not see that provider regularly and is not on any chemotherapy or other treatment.  There was some confusion at the time of triage, but he states that he is not being actively evaluated or investigated in terms of a type of cancer; he has had the CLL diagnosis for years and is not currently undergoing any treatment or investigation.  He describes his symptoms as moderate, and exertion makes him worse and nothing in particular makes them better.  He has had similar episodes in the past a couple of times and medical records do indicate similar ED visits in the past.  Past Medical History:  Diagnosis Date  . Anemia 2008  . Arthritis   . Cervicalgia   . 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 Tasia Catchings  . Hyperlipidemia   . Low back pain   . Peripheral vascular disease [facility])     Patient Active Problem List   Diagnosis Date Noted  . History of prediabetes 01/11/2018  . Gastroesophageal reflux disease 01/11/2018    . Bradycardia 10/28/2017  . Depression, recurrent ([place]) 09/29/2017  . Encounter to establish care 08/20/2017  . Fatigue 07/23/2017  . Coronary atherosclerosis 10/14/2016  . Emphysema lung ([place]) 10/14/2016  . BPH (benign prostatic hyperplasia) 10/14/2016  . Peripheral vascular disease ([place]) 10/14/2016  . Hyperlipidemia 10/14/2016  . Weight loss 06/21/2016  . Microcytic anemia 06/15/2015  . 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 COLONOSCOPY    . COLONOSCOPY WITH PROPOFOL N/A 07/23/2015   Procedure: COLONOSCOPY WITH PROPOFOL;  Surgeon: [name], MD;  Location[facility] ENDOSCOPY;  Service: Endoscopy;  Laterality: N/A;  . ESOPHAGOGASTRODUODENOSCOPY (EGD) WITH PROPOFOL N/A 08/20/2015   Procedure: ESOPHAGOGASTRODUODENOSCOPY (EGD) WITH PROPOFOL;  Surgeon: [name], MD;  Location: [facility] ENDOSCOPY;  Service: Endoscopy;  Laterality: N/A;  . lymp node removal Right 2011   arm   left arm 2015  . LYMPH NODE BIOPSY  08/2014    Prior to Admission medications   Medication Sig Start Date End Date Taking? Authorizing Provider  albuterol (PROVENTIL HFA;VENTOLIN HFA) 108 (90 Base) MCG/ACT inhaler Inhale 1-2 puffs into the lungs every 6 (six) hours as needed for wheezing or shortness of breath. 01/11/18   McLean-Scocuzza, Nino Glow, MD  doxycycline (VIBRA-TABS) 100 MG tablet Take 1 tablet (100 mg total) by mouth 2 (two) times daily. 01/11/18   McLean-Scocuzza, Nino Glow, MD  gabapentin (NEURONTIN) 300  MG capsule Take 1 capsule (300 mg total) by mouth 3 (three) times daily. 01/11/18   McLean-Scocuzza, Nino Glow, MD  IMBRUVICA 420 MG TABS  08/09/17   [provider]  MYRBETRIQ 25 MG TB24 tablet Take 1 tablet (25 mg total) by mouth daily. 01/11/18   McLean-Scocuzza, Nino Glow, MD  omeprazole (PRILOSEC) 20 MG capsule Take 1 capsule (20 mg total) by mouth daily. 30 min b/f food 01/11/18   McLean-Scocuzza, Nino Glow, MD   pravastatin (PRAVACHOL) 40 MG tablet Take 1 tablet (40 mg total) by mouth daily. 01/11/18   McLean-Scocuzza, Nino Glow, MD  predniSONE (DELTASONE) 20 MG tablet Take 2 tablets (40 mg total) by mouth daily with breakfast. 01/11/18   McLean-Scocuzza, Nino Glow, MD    Allergies Latex  Family History  Problem Relation Age of Onset  . Kidney cancer Neg Hx        lung cancer  . Bladder Cancer Neg Hx   . Prostate cancer Neg Hx     Social History Social History   Tobacco Use  . Smoking status: Current Some Day Smoker    Types: Cigars  . Smokeless tobacco: Never Used  . Tobacco comment: quit cigarettes 30 years ago used to smoke 1 pk last 1 week no FH lung cancer   Substance Use Topics  . Alcohol use: No  . Drug use: No    Review of Systems Constitutional: No fever/chills.  Generalized weakness Eyes: No visual changes. ENT: No sore throat.  Nasal congestion, runny nose, sneezing Cardiovascular: Denies chest pain. Respiratory: shortness of breath with exertion, mildly productive cough Gastrointestinal: No abdominal pain.  No appetite.  No nausea, no vomiting.  No diarrhea.  No constipation. Genitourinary: Negative for dysuria. Musculoskeletal: Negative for neck pain.  Negative for back pain. Integumentary: Negative for rash. Neurological: Negative for headaches, focal weakness or numbness.   ____________________________________________   PHYSICAL EXAM:  VITAL SIGNS: ED Triage Vitals  Enc Vitals Group     BP 02/27/18 2036 (!) 137/102     Pulse Rate 02/27/18 2036 63     Resp 02/27/18 2036 (!) 26     Temp 02/27/18 2036 98.4 F (36.9 C)     Temp Source 02/27/18 2036 Oral     SpO2 02/27/18 2036 100 %     Weight 02/27/18 2038 61.2 kg (135 lb)     Height 02/27/18 2038 1.727 m (5\' 8" )     Head Circumference --      Peak Flow --      Pain Score 02/27/18 2037 6     Pain Loc --      Pain Edu? --      Excl. in [place]? --     Constitutional: Alert and oriented. Well appearing and in  no acute distress. Eyes: Conjunctivae are normal.  Head: Atraumatic. Nose: No congestion/rhinnorhea. Mouth/Throat: Mucous membranes are moist. Neck: No stridor.  No meningeal signs.   Cardiovascular: Normal rate, regular rhythm. Good peripheral circulation. Grossly normal heart sounds. Respiratory: Normal respiratory effort.  No retractions. Lungs CTAB. Gastrointestinal: Soft and nontender. No distention.  Musculoskeletal: No lower extremity tenderness nor edema. No gross deformities of extremities. Neurologic:  Normal speech and language. No gross focal neurologic deficits are appreciated.  Skin:  Skin is warm, dry and intact. No rash noted. Psychiatric: Mood and affect are normal. Speech and behavior are normal.  ____________________________________________   LABS (all labs ordered are listed, but only abnormal results are displayed)  Labs Reviewed  BASIC METABOLIC PANEL - Abnormal; Notable for the following components:      Result Value   CO2 19 (*)    Calcium 8.8 (*)    All other components within normal limits  CBC - Abnormal; Notable for the following components:   WBC 16.2 (*)    MCV 75.2 (*)    MCH 24.0 (*)    MCHC 31.9 (*)    RDW 16.0 (*)    All other components within normal limits  URINALYSIS, COMPLETE (UACMP) WITH MICROSCOPIC - Abnormal; Notable for the following components:   Color, Urine YELLOW (*)    APPearance CLEAR (*)    Hgb urine dipstick MODERATE (*)    Protein, ur 30 (*)    All other components within normal limits  INFLUENZA PANEL BY PCR (TYPE A & B) - Abnormal; Notable for the following components:   Influenza A By PCR POSITIVE (*)    All other components within normal limits  LACTIC ACID, PLASMA  LACTIC ACID, PLASMA  TROPONIN I  CBG MONITORING, ED   ____________________________________________  EKG  ED ECG REPORT I, Hinda Kehr, the attending physician, personally viewed and interpreted this ECG.  Date: 02/27/2018 EKG Time: 20: 45 Rate:  47 Rhythm: Sinus bradycardia QRS Axis: normal Intervals: normal ST/T Wave abnormalities: T wave inversions in leads II, III, aVF, V4, V5, and V6.  ____________________________________________  RADIOLOGY I, Hinda Kehr, personally viewed and evaluated these images (plain radiographs) as part of my medical decision making, as well as reviewing the written report by the radiologist.  ED MD interpretation: No evidence of any infiltrate or other acute or emergent medical condition  Official radiology report(s): Dg Chest 1 View  Result Date: 02/27/2018 CLINICAL DATA:  Acute onset of generalized weakness and diaphoresis. Current history of cancer with unknown primary. EXAM: CHEST 1 VIEW COMPARISON:  Chest radiograph performed 10/07/2017 FINDINGS: The lungs are well-aerated and clear. There is no evidence of focal opacification, pleural effusion or pneumothorax. The cardiomediastinal silhouette is within normal limits. No acute osseous abnormalities are seen. Clips are noted at the right axilla. IMPRESSION: No acute cardiopulmonary process seen. Electronically Signed   By: Garald Balding M.D.   On: 02/27/2018 21:33    ____________________________________________   PROCEDURES  Critical Care performed: No   Procedure(s) performed:   Procedures   ____________________________________________   INITIAL IMPRESSION / ASSESSMENT AND PLAN / ED COURSE  As part of my medical decision making, I reviewed the following data within the [facility] notes reviewed and incorporated, Labs reviewed  Old chart reviewed, Radiograph reviewed  and Notes from prior ED visits    Differential diagnosis includes, but is not limited to, viral illness including but not limited to influenza, community-acquired pneumonia, viral or bacterial sinusitis, complication of cancer, general malaise and malnutrition.  The patient actually looks generally well although he is thin.  He is in no acute  distress, awake, alert, and appropriately interactive.  He is not having any symptoms at this time.  I reviewed the medical record and saw that he has had similar visits in the past for generalized weakness, syncope, and maxillary sinusitis.  His physical exam is reassuring with no evidence of acute or emergent condition.  I am awaiting lab work and am also checking an influenza given the prevalence in the community and the fact that he is does have chronic CLL.  I explained to the patient I would give some IV fluids given that he has  been having less p.o. intake recently and if we do not find a new or emergent cause that requires treatment or admission, he will likely be able to go home and follow-up as an outpatient.  He is comfortable with this plan.  Although he does have some new T wave inversions, he is having no chest pain.  His signs and symptoms are not consistent with ACS and I believe they are not contributory to his current presentation.  Given that he has had symptoms for several days, a single troponin should be sufficient and ruling out ACS.  Clinical Course as of Feb 28 149  Mon Feb 28, 2018  0100 Troponin I: <0.03 [CF]  0142 Patient's workup is reassuring with no evidence of acute or emergent condition except that he is influenza A positive.  He has a moderate leukocytosis but looking back in the past he has had significantly more elevated leukocytosis.  He is alert and in no distress.  No evidence that he needs antibiotics and he has had symptoms for 5 days or more so there is no indication for Tamiflu.  I gave him the diagnosis and my usual and customary return precautions.  He understands and agrees with the plan.  [CF]    Clinical Course User Index [CF] Hinda Kehr, MD    ____________________________________________  FINAL CLINICAL IMPRESSION(S) / ED DIAGNOSES  Final diagnoses:  Influenza A     MEDICATIONS GIVEN DURING THIS VISIT:  Medications  sodium chloride 0.9 %  bolus 500 mL (0 mLs Intravenous Stopped 02/28/18 0147)     ED Discharge Orders    None       Note:  This document was prepared using Dragon voice recognition software and may include unintentional dictation errors.    Hinda Kehr, MD 02/28/18 Margaretha Glassing    Hinda Kehr, MD 02/28/18 [phone]

## 2018-03-04 ENCOUNTER — Other Ambulatory Visit: Payer: Self-pay

## 2018-03-04 ENCOUNTER — Inpatient Hospital Stay: Payer: BLUE CROSS/BLUE SHIELD | Attending: Oncology | Admitting: Oncology

## 2018-03-04 ENCOUNTER — Encounter: Payer: Self-pay | Admitting: Oncology

## 2018-03-04 ENCOUNTER — Encounter: Payer: Self-pay | Admitting: Family

## 2018-03-04 ENCOUNTER — Inpatient Hospital Stay: Payer: BLUE CROSS/BLUE SHIELD

## 2018-03-04 VITALS — BP 115/68 | HR 45 | Temp 94.9°F | Resp 18 | Wt 135.9 lb

## 2018-03-04 DIAGNOSIS — E785 Hyperlipidemia, unspecified: Secondary | ICD-10-CM | POA: Diagnosis not present

## 2018-03-04 DIAGNOSIS — R630 Anorexia: Secondary | ICD-10-CM | POA: Insufficient documentation

## 2018-03-04 DIAGNOSIS — R634 Abnormal weight loss: Secondary | ICD-10-CM

## 2018-03-04 DIAGNOSIS — D561 Beta thalassemia: Secondary | ICD-10-CM | POA: Insufficient documentation

## 2018-03-04 DIAGNOSIS — M545 Low back pain: Secondary | ICD-10-CM | POA: Diagnosis not present

## 2018-03-04 DIAGNOSIS — M542 Cervicalgia: Secondary | ICD-10-CM | POA: Diagnosis not present

## 2018-03-04 DIAGNOSIS — D509 Iron deficiency anemia, unspecified: Secondary | ICD-10-CM

## 2018-03-04 DIAGNOSIS — F17210 Nicotine dependence, cigarettes, uncomplicated: Secondary | ICD-10-CM | POA: Diagnosis not present

## 2018-03-04 DIAGNOSIS — R5383 Other fatigue: Secondary | ICD-10-CM | POA: Diagnosis not present

## 2018-03-04 DIAGNOSIS — C9190 Lymphoid leukemia, unspecified not having achieved remission: Secondary | ICD-10-CM

## 2018-03-04 DIAGNOSIS — M129 Arthropathy, unspecified: Secondary | ICD-10-CM

## 2018-03-04 DIAGNOSIS — C9110 Chronic lymphocytic leukemia of B-cell type not having achieved remission: Secondary | ICD-10-CM

## 2018-03-04 DIAGNOSIS — D589 Hereditary hemolytic anemia, unspecified: Secondary | ICD-10-CM

## 2018-03-04 DIAGNOSIS — I739 Peripheral vascular disease, unspecified: Secondary | ICD-10-CM | POA: Insufficient documentation

## 2018-03-04 DIAGNOSIS — J111 Influenza due to unidentified influenza virus with other respiratory manifestations: Secondary | ICD-10-CM

## 2018-03-04 LAB — CBC WITH DIFFERENTIAL/PLATELET
BASOS PCT: 0 %
Basophils Absolute: 0.1 10*3/uL (ref 0–0.1)
EOS ABS: 0.3 10*3/uL (ref 0–0.7)
EOS PCT: 2 %
HCT: 35 % — ABNORMAL LOW (ref 40.0–52.0)
Hemoglobin: 11.4 g/dL — ABNORMAL LOW (ref 13.0–18.0)
LYMPHS ABS: 14.3 10*3/uL — AB (ref 1.0–3.6)
Lymphocytes Relative: 79 %
MCH: 24.4 pg — AB (ref 26.0–34.0)
MCHC: 32.5 g/dL (ref 32.0–36.0)
MCV: 75.1 fL — ABNORMAL LOW (ref 80.0–100.0)
Monocytes Absolute: 0.6 10*3/uL (ref 0.2–1.0)
Monocytes Relative: 3 %
Neutro Abs: 2.9 10*3/uL (ref 1.4–6.5)
Neutrophils Relative %: 16 %
PLATELETS: 144 10*3/uL — AB (ref 150–440)
RBC: 4.67 MIL/uL (ref 4.40–5.90)
RDW: 15.9 % — ABNORMAL HIGH (ref 11.5–14.5)
WBC: 18.2 10*3/uL — AB (ref 3.8–10.6)

## 2018-03-04 LAB — COMPREHENSIVE METABOLIC PANEL
ALK PHOS: 50 U/L (ref 38–126)
ALT: 16 U/L — AB (ref 17–63)
AST: 25 U/L (ref 15–41)
Albumin: 3.6 g/dL (ref 3.5–5.0)
Anion gap: 7 (ref 5–15)
BILIRUBIN TOTAL: 0.5 mg/dL (ref 0.3–1.2)
BUN: 12 mg/dL (ref 6–20)
CALCIUM: 8.6 mg/dL — AB (ref 8.9–10.3)
CHLORIDE: 107 mmol/L (ref 101–111)
CO2: 25 mmol/L (ref 22–32)
CREATININE: 0.95 mg/dL (ref 0.61–1.24)
Glucose, Bld: 78 mg/dL (ref 65–99)
Potassium: 4.3 mmol/L (ref 3.5–5.1)
Sodium: 139 mmol/L (ref 135–145)
TOTAL PROTEIN: 6.4 g/dL — AB (ref 6.5–8.1)

## 2018-03-04 MED ORDER — FLUTICASONE PROPIONATE HFA 44 MCG/ACT IN AERO: 2.0000 | INHALATION_SPRAY | Freq: Two times a day (BID) | RESPIRATORY_TRACT | 12 refills | Status: DC

## 2018-03-04 NOTE — Progress Notes (Signed)
[facility] day:  03/04/18    Chief Complaint: [name] is a 67 y.o. 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  HPI:  The patient was last seen in the medical onocology clinic on 12/25/2016. Patient reports feeling profound fatigue, lack of appetite, weight loss. He weight 128 pounds today, comparing to 137 pounds when he was here in January 2018.  He met with [name], registered dietitian on 01/04/2017. Patient used to follow up with Dr.[name].  He missed his  follow-up with Dr. Mike Gip.   Treatment history: He originally presented with hemolytic anemia in 01/2014.  He was treated with prednisone. With taper of prednisone, his hemolysis returned.I think it's only positives are Per Dr.[name]'s note, patient received 4 weekly cycles of Rituxan (10/02/2014- 10/23/2014).   07/23/2017 Decision was made to start Ibrutinib as patient is very symptomatic (fatigue and weight loss, lack of appetite). Patient did not show up at the chemo education class. 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   INTERVAL HISTORY Patient presents for follow-up for CLL.  He was recently in the emergency room due to "symptoms and nasal congestion for 5 days.[name]  He was found to be positive for influenza. He reports not having appetite and not drinking fluids.  No nausea vomiting, abdominal pain.  Patient was given IV fluid and was discharged home.  Today patient reports actually he is feeling better.  He told me that prior to this episode of "symptoms/flu he was actually feeling well and has gained weight., His appetite is also getting better.  No new complaints today.  Past Medical History:  Diagnosis Date  . Anemia 2008  . Arthritis   . Cervicalgia   . Chronic lymphocytic leukemia (CLL), B-cell  ([place]) 06/15/2015   Dr. Tasia Catchings  . Hyperlipidemia   . Low back pain   . Peripheral vascular disease [facility])     Past Surgical History:  Procedure Laterality Date  . COLONOSCOPY    . COLONOSCOPY WITH PROPOFOL N/A 07/23/2015   Procedure: COLONOSCOPY WITH PROPOFOL;  Surgeon: [name], MD;  Location: [facility] ENDOSCOPY;  Service: Endoscopy;  Laterality: N/A;  . ESOPHAGOGASTRODUODENOSCOPY (EGD) WITH PROPOFOL N/A 08/20/2015   Procedure: ESOPHAGOGASTRODUODENOSCOPY (EGD) WITH PROPOFOL;  Surgeon: [name], MD;  Location: [facility] ENDOSCOPY;  Service: Endoscopy;  Laterality: N/A;  . lymp node removal Right 2011   arm   left arm 2015  . LYMPH NODE BIOPSY  08/2014    Family History  Problem Relation Age of Onset  . Kidney cancer Neg Hx        lung cancer  . Bladder Cancer Neg Hx   . Prostate cancer Neg Hx     Social History:  reports that he has been smoking cigars.  he has never used smokeless tobacco. He reports that he does not drink alcohol or use drugs.  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  Allergies:  Allergies  Allergen Reactions  . Latex Rash    Current Medications: Ibuprofen PRN Review of Systems:  Review of Systems  Constitutional: Negative for chills, fever, malaise/fatigue and weight loss.  HENT: Negative for ear discharge, ear pain and nosebleeds.   Eyes: Negative for photophobia and  pain.  Respiratory: Negative for cough, hemoptysis and sputum production.        Flu symptoms.   Cardiovascular: Negative for chest pain and palpitations.  Gastrointestinal: Negative for constipation, diarrhea, heartburn, nausea and vomiting.  Genitourinary: Negative for dysuria and frequency.  Musculoskeletal: Negative for neck pain.  Neurological: Negative for dizziness, tremors, weakness and headaches.  Endo/Heme/Allergies: Does not bruise/bleed easily.  Psychiatric/Behavioral: Negative for depression and  suicidal ideas.   Physical Exam: Blood pressure 115/68, pulse (!) 45, temperature (!) 94.9 F (34.9 C), temperature source Tympanic, resp. rate 18, weight 135 lb 14.4 oz (61.6 kg). GENERAL:  no acute distress. MENTAL STATUS:  Alert and oriented to person, place and time. HEAD:   Alopecia.    Normocephalic, atraumatic, face symmetric, no Cushingoid features. EYES:  Pupils equal round and reactive to light and accomodation.  No conjunctivitis or scleral icterus. ENT:  Oropharynx clear without lesion.  Dentures.  Tongue normal. Mucous membranes moist.  RESPIRATORY:  Clear to auscultation without rales, wheezes or rhonchi. CARDIOVASCULAR:  Regular rate and rhythm without murmur, rub or gallop. ABDOMEN:  Soft, non-tender, with active bowel sounds, and no hepatosplenomegaly.  No masses. SKIN:  No rashes, ulcers or lesions. EXTREMITIES: No edema, no skin discoloration or tenderness.  No palpable cords. LYMPH NODES:  right supraclavicular small mobile lymph node. Axillary lymphadenopathy bilateral,The largest one on the right is about 2cm. NEUROLOGICAL: Unremarkable. PSYCH:  Appropriate.  LABORATORY RESULTS. CBC    Component Value Date/Time   WBC 18.2 (H) 03/04/2018 0840   RBC 4.67 03/04/2018 0840   HGB 11.4 (L) 03/04/2018 0840   HGB 13.0 03/13/2015 0852   HCT 35.0 (L) 03/04/2018 0840   HCT 40.9 03/13/2015 0852   PLT 144 (L) 03/04/2018 0840   PLT 217 03/13/2015 0852   MCV 75.1 (L) 03/04/2018 0840   MCV 78 (L) 03/13/2015 0852   MCH 24.4 (L) 03/04/2018 0840   MCHC 32.5 03/04/2018 0840   RDW 15.9 (H) 03/04/2018 0840   RDW 16.0 (H) 03/13/2015 0852   LYMPHSABS 14.3 (H) 03/04/2018 0840   LYMPHSABS 3.9 (H) 03/13/2015 0852   MONOABS 0.6 03/04/2018 0840   MONOABS 0.4 03/13/2015 0852   EOSABS 0.3 03/04/2018 0840   EOSABS 0.4 03/13/2015 0852   BASOSABS 0.1 03/04/2018 0840   BASOSABS 0.1 03/13/2015 0852   CMP     Component Value Date/Time   NA 139 03/04/2018 0840   NA 136 03/13/2015  0852   K 4.3 03/04/2018 0840   K 3.9 03/13/2015 0852   CL 107 03/04/2018 0840   CL 104 03/13/2015 0852   CO2 25 03/04/2018 0840   CO2 26 03/13/2015 0852   GLUCOSE 78 03/04/2018 0840   GLUCOSE 96 03/13/2015 0852   BUN 12 03/04/2018 0840   BUN 11 03/13/2015 0852   CREATININE 0.95 03/04/2018 0840   CREATININE 1.09 03/13/2015 0852   CALCIUM 8.6 (L) 03/04/2018 0840   CALCIUM 9.3 03/13/2015 0852   PROT 6.4 (L) 03/04/2018 0840   PROT 8.1 03/13/2015 0852   ALBUMIN 3.6 03/04/2018 0840   ALBUMIN 4.5 03/13/2015 0852   AST 25 03/04/2018 0840   AST 26 03/13/2015 0852   ALT 16 (L) 03/04/2018 0840   ALT 12 (L) 03/13/2015 0852   ALKPHOS 50 03/04/2018 0840   ALKPHOS 57 03/13/2015 0852   BILITOT 0.5 03/04/2018 0840   BILITOT 1.2 03/13/2015 0852   GFRNONAA >60 03/04/2018 0840   GFRNONAA >60 03/13/2015 0852   GFRAA >60 03/04/2018 0840  GFRAA >60 03/13/2015 0852   PATHOLOGY:  11/02/2016 Peripheral blood FISH studies revealed 20% of nuclei positive for loss of 1 ATM signal, 63% of nuclei positive for trisomy 12, and 32% of nuclei positive for loss of 1 TP53 signal. Results for CCND1/IGH and 13q were normal.  09/13/2014 Left axillary node biopsy on  confirmed B-cell small lymphocytic lymphoma (B-CLL/SLL)   IMAGE STUDIES  02/09/2014 Chest and abdomen CT scan  revealed bulky bilateral axillary adenopathy and bulky intraperitoneal and retroperitoneal adenopathy. 07/11/2014 PET scan  revealed mildly hypermetabolic lymph nodes in the chest, abdomen, and pelvis and mildly hypermetabolic spleen.  06/24/2016 PET scan  revealed bilateral moderate hypermetabolic axillary lymphadenopathy increased in size (1.6 cm compared to 1.3 cm) and metabolism (SUV 3.9 compared to 3.6).  There were bilateral moderate hypermetabolic external iliac lymphadenopathy stable to decreased in metabolism.  There was mesenteric and retroperitoneal lymphadenopathy stable in size and non hypermetabolic.  There were no new sites of  hypermetabolic lymphoma.  There were subcentimeter pulmonary nodules stable and below PET resolution. 07/21/2017 CT chest abdomen pelvis w contrast:  Mild interval increase in bulky mesenteric lymphadenopathy since prior exam. Stable lymphadenopathy in bilateral axillary regions, abdominal retroperitoneum, and bilateral external iliac chains.  PREVIOUS ENDOSCOPY He has slowly progressive microcytic anemia.  Colonoscopy on 07/23/2015 revealed diverticulosis in the sigmoid colon.  EGD on 08/20/2015 was normal. Guaiac cards x 3 were negative in 06/2015.  His diet fluctuates with his appetite.  He denies any melana or hematochezia.   Assessment:  [name] is a 67 y.o. male with B-cell small lymphocytic lymphoma (B-cell CLL/SLL) initially presented with hemolytic anemia in 01/2014, s/p treatment with steroid and Rituximab, now with significant weight loss, lack of appetite and fatigue.   1. CLL (chronic lymphocytic leukemia) ([place])   2. Weight loss   3. Flu   Plan: #CLL, continue monitor.  His counts has been stable.  During the interval, patient has been feeling better with good appetite and stable weight.  Continue hold treatment.  #Clinically he has recovered from his flu.  I advised patient continue follow-up with primary care physician/  Follow up with me in 3 months.   Earlie Server, MD, PhD Hematology Oncology [facility] at [facility] Pager- [phone] 03/04/2018

## 2018-03-04 NOTE — Progress Notes (Signed)
Her for follow up. Per pt dx w flu on 02/27/18-feeling much better. Needs refills " of everything " he stated  -will relay to [place]

## 2018-03-27 IMAGING — CR DG CHEST 1V
1 series · 1 of 1 positions shown · non-contrast
Comparison: Chest radiograph performed 10/07/2017

CLINICAL DATA: Acute onset of generalized weakness and diaphoresis.
Current history of cancer with unknown primary.

EXAM:
CHEST 1 VIEW

[chest pa]
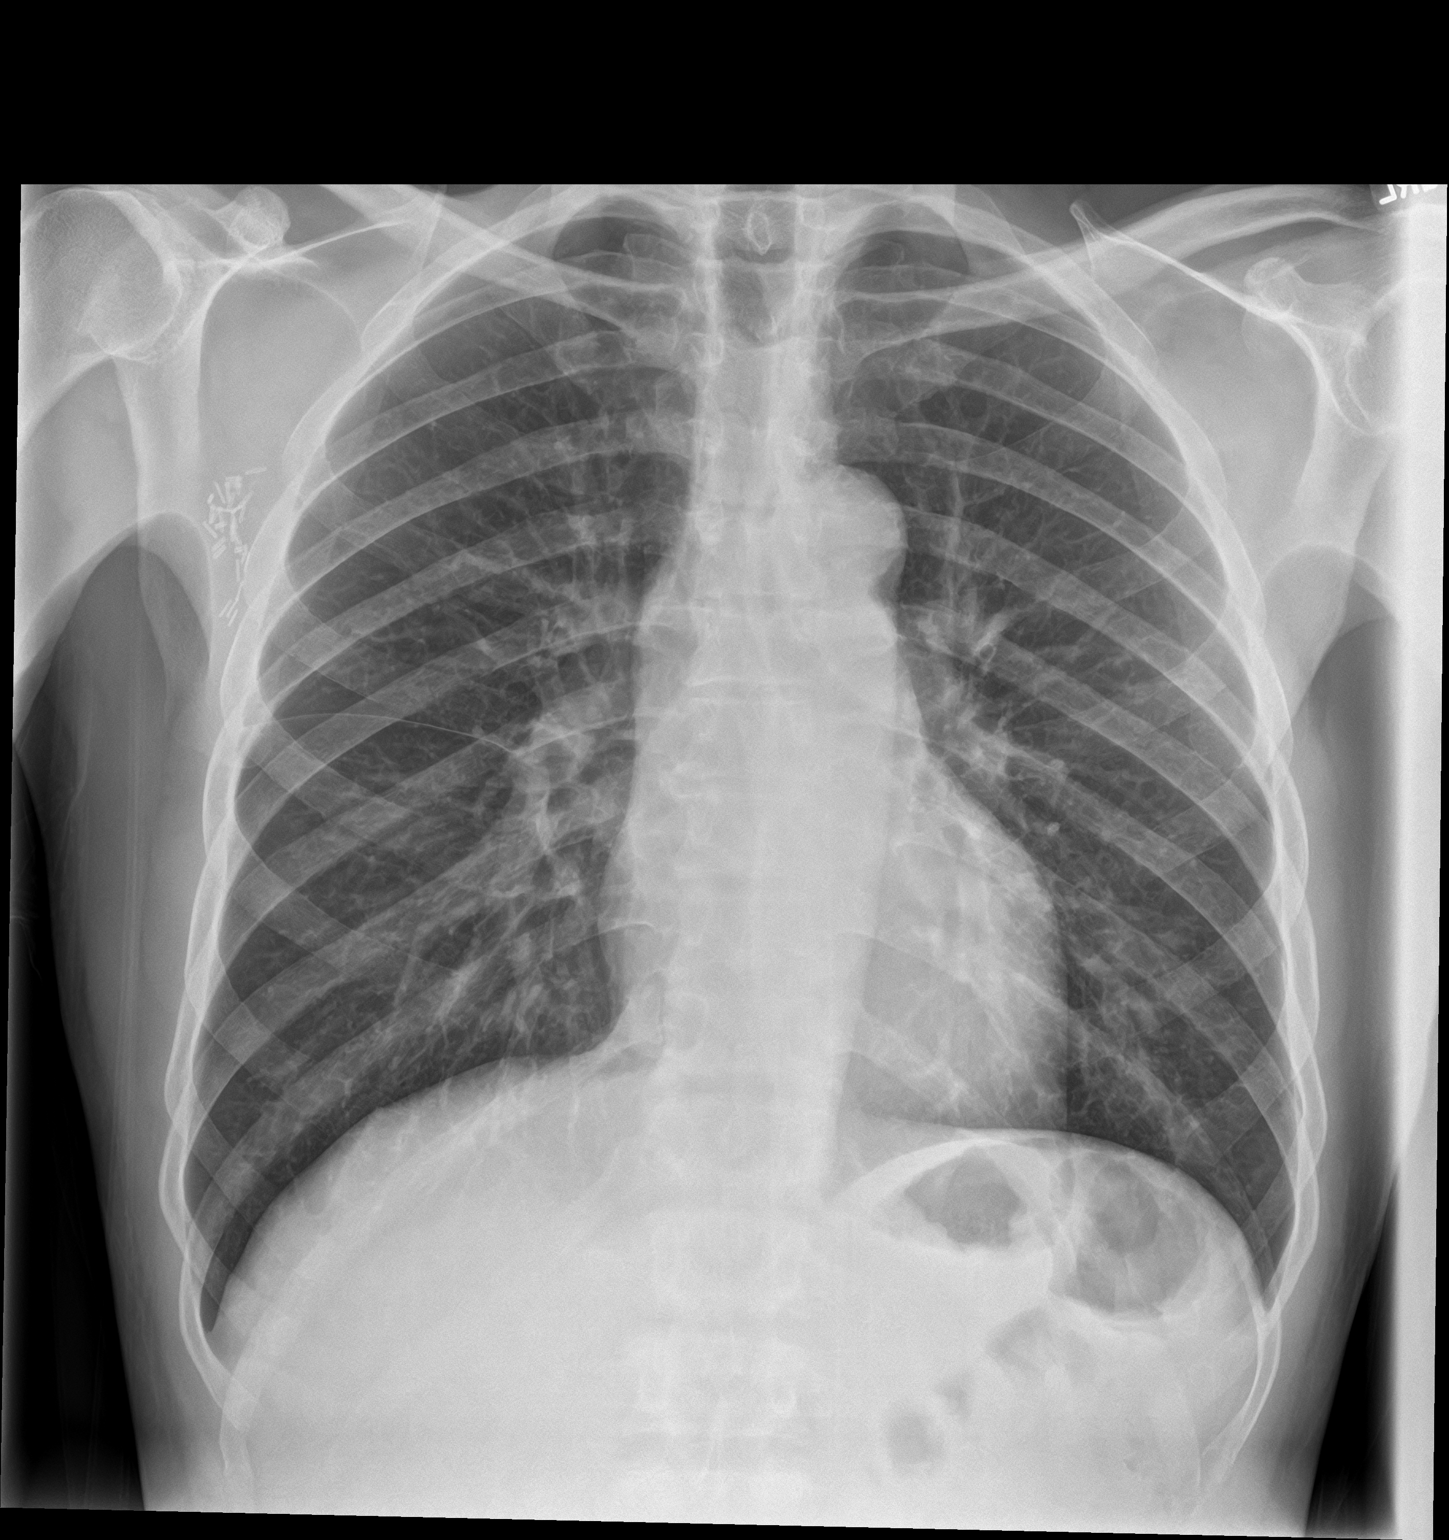

[1 of 1 positions shown; findings below may reference images not displayed]

FINDINGS: The lungs are well-aerated and clear. There is no evidence of focal
opacification, pleural effusion or pneumothorax.

The cardiomediastinal silhouette is within normal limits. No acute
osseous abnormalities are seen. Clips are noted at the right axilla.
IMPRESSION: No acute cardiopulmonary process seen.

## 2018-04-01 ENCOUNTER — Ambulatory Visit (INDEPENDENT_AMBULATORY_CARE_PROVIDER_SITE_OTHER): Payer: BLUE CROSS/BLUE SHIELD | Admitting: Family

## 2018-04-01 ENCOUNTER — Encounter: Payer: Self-pay | Admitting: Family

## 2018-04-01 VITALS — BP 114/70 | HR 59 | Temp 98.0°F | Wt 138.2 lb

## 2018-04-01 DIAGNOSIS — I739 Peripheral vascular disease, unspecified: Secondary | ICD-10-CM

## 2018-04-01 DIAGNOSIS — Z122 Encounter for screening for malignant neoplasm of respiratory organs: Secondary | ICD-10-CM

## 2018-04-01 DIAGNOSIS — M79605 Pain in left leg: Secondary | ICD-10-CM

## 2018-04-01 DIAGNOSIS — M79604 Pain in right leg: Secondary | ICD-10-CM | POA: Diagnosis not present

## 2018-04-01 DIAGNOSIS — F339 Major depressive disorder, recurrent, unspecified: Secondary | ICD-10-CM | POA: Diagnosis not present

## 2018-04-01 DIAGNOSIS — J439 Emphysema, unspecified: Secondary | ICD-10-CM

## 2018-04-01 DIAGNOSIS — Z23 Encounter for immunization: Secondary | ICD-10-CM

## 2018-04-01 NOTE — Assessment & Plan Note (Addendum)
Patient is well-appearing and in no acute respiratory distress today.  Influenza +4-month ago.  However patient states congestion has been lingering for the past 4 months.  No formal lung testing to diagnose COPD however with smoking history, and what appears to be chronic congestion, suspect COPD is likely at play.  Patient has no increased shortness of breath, wheezing or fever.  Discussed with him a trial of Mucinex with plenty of water to see if this helps break up/ dislodges mucus.  If no improvement, will pursue prednisone, antibiotic therapy, and pulmonology consult.

## 2018-04-01 NOTE — Assessment & Plan Note (Addendum)
Chronic. discussed that his lower extremity pain may be related to combination of neuropathy and also PVD such as intermittent claudication.  Pending consult from Dr. Lucky Cowboy as he as seen in the past.  Patient will follow-up me in 3 months or sooner to ensure this symptom has improved.  At this time, advised him to go ahead and start gabapentin since he was unsure if he is done this. Note,we may decide to consult neurology. Pending folate, b12, tsh

## 2018-04-01 NOTE — Assessment & Plan Note (Signed)
Stable at this time.  We jointly agreed if we could better control his pain in his legs, patient's mood would likely improved.  He politely declines any medication for depression, anxiety at this time.

## 2018-04-01 NOTE — Progress Notes (Signed)
Subjective:    Patient ID: [name], male    DOB: Jul 20, 1951, 67 y.o.   MRN: [id number]  CC: [name] is a 67 y.o. male who presents today for follow up.   HPI: Complaint of cough and mucous 'left over from flu'. No sob,wheezing, fever. Cigar every now and then.   Complains of bilateral foot pain for 15 years, worsening of late.  He is not sure if he ever tried the gabapentin.  He states he is a lot going on at that time and just is not sure.  Works as Sports coach. Has to sit after walking for long periods. Walking in 'nails, needles'.  Never received chemo for CLL.   NO swelling in legs. Seen Dr Lucky Cowboy in past.    Appetite good however sometimes nausated and cannot eat well. Overall better at this time. He does note that the leg pain does make him sad at times. No si/hi.   NO neck pain.  Declines referral to orthopedic at this time.   CT chest 2018  Notes mild paraseptal emphysema again noted  ED for 3/10 for influenza ; cxr no acute process; WBC 18 , Hgb 11 CLL- follows with Dr Tasia Catchings Seen by Colleague Dr. Ursula Alert in January 2019-suspected COPD exacerbation.  Given doxycycline and prednisone, with improvement however thinks that cough now is continuation.  CT of neck reordered ( not done) x-ray cervical spine shows severe degenerative changes, no evidence of fracture.  Carotid vascular disease.  HISTORY:  Past Medical History:  Diagnosis Date  . Anemia 2008  . Arthritis   . Cervicalgia   . Chronic lymphocytic leukemia (CLL), B-cell ([place]) 06/15/2015   Dr. Tasia Catchings  . Hyperlipidemia   . Low back pain   . Peripheral vascular disease [facility])    Past Surgical History:  Procedure Laterality Date  . COLONOSCOPY    . COLONOSCOPY WITH PROPOFOL N/A 07/23/2015   Procedure: COLONOSCOPY WITH PROPOFOL;  Surgeon: [name], MD;  Location: [facility] ENDOSCOPY;  Service: Endoscopy;  Laterality: N/A;  . ESOPHAGOGASTRODUODENOSCOPY (EGD) WITH PROPOFOL N/A 08/20/2015   Procedure:  ESOPHAGOGASTRODUODENOSCOPY (EGD) WITH PROPOFOL;  Surgeon: [name], MD;  Location: [facility] ENDOSCOPY;  Service: Endoscopy;  Laterality: N/A;  . lymp node removal Right 2011   arm   left arm 2015  . LYMPH NODE BIOPSY  08/2014   Family History  Problem Relation Age of Onset  . Kidney cancer Neg Hx        lung cancer  . Bladder Cancer Neg Hx   . Prostate cancer Neg Hx     Allergies: Latex Current Outpatient Medications on File Prior to Visit  Medication Sig Dispense Refill  . albuterol (PROVENTIL HFA;VENTOLIN HFA) 108 (90 Base) MCG/ACT inhaler Inhale 1-2 puffs into the lungs every 6 (six) hours as needed for wheezing or shortness of breath. (Patient not taking: Reported on 03/04/2018) 1 Inhaler 11  . gabapentin (NEURONTIN) 300 MG capsule Take 1 capsule (300 mg total) by mouth 3 (three) times daily. (Patient not taking: Reported on 03/04/2018) 90 capsule 1  . MYRBETRIQ 25 MG TB24 tablet Take 1 tablet (25 mg total) by mouth daily. (Patient not taking: Reported on 03/04/2018) 90 tablet 0  . omeprazole (PRILOSEC) 20 MG capsule Take 1 capsule (20 mg total) by mouth daily. 30 min b/f food (Patient not taking: Reported on 03/04/2018) 90 capsule 0  . pravastatin (PRAVACHOL) 40 MG tablet Take 1 tablet (40 mg total) by mouth daily. (Patient not taking: Reported  on 03/04/2018) 90 tablet 3   No current facility-administered medications on file prior to visit.     Social History   Tobacco Use  . Smoking status: Current Some Day Smoker    Types: Cigars  . Smokeless tobacco: Never Used  . Tobacco comment: quit cigarettes 30 years ago used to smoke 1 pk last 1 week no FH lung cancer   Substance Use Topics  . Alcohol use: No  . Drug use: No    Review of Systems  Constitutional: Negative for chills, fever and unexpected weight change.  HENT: Positive for congestion. Negative for ear pain, sinus pressure and sore throat.   Respiratory: Positive for cough. Negative for shortness of breath and  wheezing.   Cardiovascular: Negative for chest pain, palpitations and leg swelling.  Gastrointestinal: Negative for nausea and vomiting.  Neurological: Positive for numbness.      Objective:    BP 114/70 (BP Location: Left Arm, Patient Position: Sitting, Cuff Size: Normal)   Pulse (!) 59   Temp 98 F (36.7 C) (Oral)   Wt 138 lb 4 oz (62.7 kg)   SpO2 97%   BMI 21.02 kg/m  BP Readings from Last 3 Encounters:  04/01/18 114/70  03/04/18 115/68  02/28/18 (!) 120/59   Wt Readings from Last 3 Encounters:  04/01/18 138 lb 4 oz (62.7 kg)  03/04/18 135 lb 14.4 oz (61.6 kg)  02/27/18 135 lb (61.2 kg)    Physical Exam  Constitutional: He appears well-developed and well-nourished.  Cardiovascular: Regular rhythm and normal heart sounds.  Pulmonary/Chest: Effort normal and breath sounds normal. No respiratory distress. He has no wheezes. He has no rhonchi. He has no rales.  Lymphadenopathy:       Head (left side): No submandibular and no preauricular adenopathy present.  Neurological: He is alert.  Decreased sensation to touch bilateral feet. Unable to appreciated the microfilament on distal aspect of feet, great toe. Skin intact  Skin: Skin is warm and dry.  Psychiatric: He has a normal mood and affect. His speech is normal and behavior is normal.  Vitals reviewed.      Assessment & Plan:   Problem List Items Addressed This Visit      Cardiovascular and Mediastinum   Peripheral vascular disease ([place])    Clinical picture does support peripheral vascular disease.  Discussed with patient how carotid disease seen on recent cervical spine x-ray.  Pending carotid Doppler.  A      Relevant Orders   US Carotid Duplex Bilateral     Respiratory   Emphysema lung ([place])    Patient is well-appearing and in no acute respiratory distress today.  Influenza +53-month ago.  However patient states congestion has been lingering for the past 4 months.  No formal lung testing to diagnose COPD  however with smoking history, and what appears to be chronic congestion, suspect COPD is likely at play.  Patient has no increased shortness of breath, wheezing or fever.  Discussed with him a trial of Mucinex with plenty of water to see if this helps break up/ dislodges mucus.  If no improvement, will pursue prednisone, antibiotic therapy, and pulmonology consult.        Other   Encounter for screening for malignant neoplasm of respiratory organs   Relevant Orders   CT CHEST LUNG CANCER SCREENING LOW DOSE WO CONTRAST   Depression, recurrent ([place])    Stable at this time.  We jointly agreed if we could better control his pain  in his legs, patient's mood would likely improved.  He politely declines any medication for depression, anxiety at this time.      Pain in both lower extremities - Primary    Chronic. discussed that his lower extremity pain may be related to combination of neuropathy and also PVD such as intermittent claudication.  Pending consult from Dr. Lucky Cowboy as he as seen in the past.  Patient will follow-up me in 3 months or sooner to ensure this symptom has improved.  At this time, advised him to go ahead and start gabapentin since he was unsure if he is done this. Note,we may decide to consult neurology. Pending folate, b12, tsh       Relevant Orders   US Carotid Duplex Bilateral   Ambulatory referral to Vascular Surgery   Hemoglobin A1c   B12 and Folate Panel   TSH    Other Visit Diagnoses    Need for diphtheria-tetanus-pertussis (Tdap) vaccine       Relevant Orders   Tdap vaccine greater than or equal to 7yo IM (Completed)       I have discontinued Marckus Alanis's predniSONE. I am also having him maintain his gabapentin, MYRBETRIQ, pravastatin, omeprazole, and albuterol.   No orders of the defined types were placed in this encounter.   Return precautions given.   Risks, benefits, and alternatives of the medications and treatment plan prescribed today were discussed,  and patient expressed understanding.   Education regarding symptom management and diagnosis given to patient on AVS.  Continue to follow with [name], FNP for routine health maintenance.   Earney Hamburg and I agreed with plan.   [name], FNP

## 2018-04-01 NOTE — Patient Instructions (Signed)
Trial of gabapentin--please try this as Dr Aundra Dubin prescribed, should help your legs  Start PLAIN mucinex with plenty of water for congesiton. If not improvement after a few days, give me a call.   Today we discussed referrals, orders. Dr Lucky Cowboy, ultrasound of carotid arteries   I have placed these orders in the system for you.  Please be sure to give [name] a call if you have not heard from our office regarding scheduling a test or regarding referral in a timely manner.  It is very important that you let me know as soon as possible.

## 2018-04-01 NOTE — Assessment & Plan Note (Addendum)
Clinical picture does support peripheral vascular disease.  Discussed with patient how carotid disease seen on recent cervical spine x-ray.  Pending carotid Doppler.  A

## 2018-04-04 ENCOUNTER — Telehealth: Payer: Self-pay | Admitting: *Deleted

## 2018-04-04 NOTE — Telephone Encounter (Signed)
Patient returned call regarding lung screening scan. Discussed smoking history and also discovered that patient has had CT of chest 07/21/17. Patient will be reevaluated for screening closer to the 07/21/18 date. Patient is in agreement with this plan.

## 2018-04-04 NOTE — Telephone Encounter (Signed)
Received referral for low dose lung cancer screening CT scan. Unable to leave Message at phone number listed in EMR . Will attempt to contact at a later date.

## 2018-04-19 ENCOUNTER — Encounter (INDEPENDENT_AMBULATORY_CARE_PROVIDER_SITE_OTHER): Payer: Self-pay | Admitting: Vascular Surgery

## 2018-04-19 ENCOUNTER — Ambulatory Visit (INDEPENDENT_AMBULATORY_CARE_PROVIDER_SITE_OTHER): Payer: BLUE CROSS/BLUE SHIELD | Admitting: Vascular Surgery

## 2018-04-19 ENCOUNTER — Telehealth: Payer: Self-pay

## 2018-04-19 VITALS — BP 111/65 | HR 51 | Resp 16 | Ht 68.0 in | Wt 134.8 lb

## 2018-04-19 DIAGNOSIS — I2510 Atherosclerotic heart disease of native coronary artery without angina pectoris: Secondary | ICD-10-CM

## 2018-04-19 DIAGNOSIS — J439 Emphysema, unspecified: Secondary | ICD-10-CM

## 2018-04-19 DIAGNOSIS — I739 Peripheral vascular disease, unspecified: Secondary | ICD-10-CM | POA: Diagnosis not present

## 2018-04-19 DIAGNOSIS — E785 Hyperlipidemia, unspecified: Secondary | ICD-10-CM | POA: Diagnosis not present

## 2018-04-19 DIAGNOSIS — I70219 Atherosclerosis of native arteries of extremities with intermittent claudication, unspecified extremity: Secondary | ICD-10-CM | POA: Insufficient documentation

## 2018-04-19 NOTE — Progress Notes (Signed)
Patient ID: [name], male   DOB: May 19, 1951, 67 y.o.   MRN: [id number]  Chief Complaint  Patient presents with  . New Patient (Initial Visit)    ref [place] bil leg pain    HPI [name] is a 67 y.o. male.  I am asked to see the patient by Dr. Vidal Schwalbe for evaluation of leg pain with activity.  The patient reports previously being assessed and treated for circulatory issues for 5 years ago, but has had no recent assessment to his knowledge.  Over the past year or 2, he has noticed progressive weakness and fatigue in his legs with activity.  This starts in his calves and thighs but goes all the way up into his back.  He reports no ulceration or infection.  Both legs are affected about the same.  The degree of which she can walk is difficult to estimate, but he can walk for several minutes before the legs give out.  He said is more about the weakness in the leg than it is the pain in his leg at this point, although the pain is significant as well.  This has been steadily progressive without a clear inciting event or causative factor.  He says he was started on some nerve pills which seem to help a little bit.   Past Medical History:  Diagnosis Date  . Anemia 2008  . Arthritis   . Cervicalgia   . 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 Tasia Catchings  . Hyperlipidemia   . Low back pain   . Peripheral vascular disease [facility])     Past Surgical History:  Procedure Laterality Date  . COLONOSCOPY    . COLONOSCOPY WITH PROPOFOL N/A 07/23/2015   Procedure: COLONOSCOPY WITH PROPOFOL;  Surgeon: [name], MD;  Location: [facility] ENDOSCOPY;  Service: Endoscopy;  Laterality: N/A;  . ESOPHAGOGASTRODUODENOSCOPY (EGD) WITH PROPOFOL N/A 08/20/2015   Procedure: ESOPHAGOGASTRODUODENOSCOPY (EGD) WITH PROPOFOL;  Surgeon: [name], MD;  Location: [facility] ENDOSCOPY;  Service: Endoscopy;  Laterality: N/A;  . lymp node removal Right 2011   arm   left arm 2015  . LYMPH NODE BIOPSY   08/2014    Family History  Problem Relation Age of Onset  . Kidney cancer Neg Hx        lung cancer  . Bladder Cancer Neg Hx   . Prostate cancer Neg Hx   no bleeding disorders, clotting disorders, or autoimmune diseases  Social History Social History   Tobacco Use  . Smoking status: Current Some Day Smoker    Types: Cigars  . Smokeless tobacco: Never Used  . Tobacco comment: quit cigarettes 30 years ago used to smoke 1 pk last 1 week no FH lung cancer   Substance Use Topics  . Alcohol use: No  . Drug use: No    Allergies  Allergen Reactions  . Latex Rash    Current Outpatient Medications  Medication Sig Dispense Refill  . albuterol (PROVENTIL HFA;VENTOLIN HFA) 108 (90 Base) MCG/ACT inhaler Inhale 1-2 puffs into the lungs every 6 (six) hours as needed for wheezing or shortness of breath. 1 Inhaler 11  . gabapentin (NEURONTIN) 300 MG capsule Take 1 capsule (300 mg total) by mouth 3 (three) times daily. 90 capsule 1  . MYRBETRIQ 25 MG TB24 tablet Take 1 tablet (25 mg total) by mouth daily. 90 tablet 0  . omeprazole (PRILOSEC) 20 MG capsule Take 1 capsule (20 mg total) by mouth daily. [place]  min b/f food 90 capsule 0  . pravastatin (PRAVACHOL) 40 MG tablet Take 1 tablet (40 mg total) by mouth daily. 90 tablet 3  . predniSONE (DELTASONE) 20 MG tablet Take 20 mg by mouth daily with breakfast.     No current facility-administered medications for this visit.       REVIEW OF SYSTEMS (Negative unless checked)  Constitutional: [] Weight loss  [] Fever  [] Chills Cardiac: [] Chest pain   [] Chest pressure   [] Palpitations   [] Shortness of breath when laying flat   [] Shortness of breath at rest   [] Shortness of breath with exertion. Vascular:  [x] Pain in legs with walking   [] Pain in legs at rest   [] Pain in legs when laying flat   [x] Claudication   [] Pain in feet when walking  [] Pain in feet at rest  [] Pain in feet when laying flat   [] History of DVT   [] Phlebitis   [] Swelling in legs    [] Varicose veins   [] Non-healing ulcers Pulmonary:   [] Uses home oxygen   [] Productive cough   [] Hemoptysis   [] Wheeze  [] COPD   [] Asthma Neurologic:  [] Dizziness  [] Blackouts   [] Seizures   [] History of stroke   [] History of TIA  [] Aphasia   [] Temporary blindness   [] Dysphagia   [] Weakness or numbness in arms   [x] Weakness or numbness in legs Musculoskeletal:  [] Arthritis   [] Joint swelling   [] Joint pain   [] Low back pain Hematologic:  [x] Easy bruising  [] Easy bleeding   [] Hypercoagulable state   [x] Anemic  [] Hepatitis Gastrointestinal:  [] Blood in stool   [] Vomiting blood  [] Gastroesophageal reflux/heartburn   [] Abdominal pain Genitourinary:  [] Chronic kidney disease   [] Difficult urination  [] Frequent urination  [] Burning with urination   [] Hematuria Skin:  [] Rashes   [] Ulcers   [] Wounds Psychological:  [] History of anxiety   []  History of major depression.    Physical Exam BP 111/65 (BP Location: Right Arm)   Pulse (!) 51   Resp 16   Ht 5' 8"  (1.727 m)   Wt 61.1 kg (134 lb 12.8 oz)   BMI 20.50 kg/m  Gen:  WD/WN, NAD Head: [place]/AT, No temporalis wasting. Ear/Nose/Throat: Hearing grossly intact, nares w/o erythema or drainage, oropharynx w/o Erythema/Exudate Eyes: Conjunctiva clear, sclera non-icteric  Neck: trachea midline.  No bruit or JVD.  Pulmonary:  Good air movement, respirations not labored, no use of accessory muscles Cardiac: RRR, no JVD Vascular:  Vessel Right Left  Radial Palpable Palpable                          PT  1+ palpable  1+ palpable  DP Palpable  1+ palpable   Gastrointestinal: soft, non-tender/non-distended.  Musculoskeletal: M/S 5/5 throughout.  Extremities without ischemic changes.  No deformity or atrophy.  No edema. Neurologic: Sensation grossly intact in extremities.  Symmetrical.  Speech is fluent. Motor exam as listed above. Psychiatric: Judgment intact, Mood & affect appropriate for pt's clinical situation. Dermatologic: No rashes or ulcers  noted.  No cellulitis or open wounds.  Radiology No results found.  Labs Recent Results (from the past 2160 hour(s))  Basic metabolic panel     Status: Abnormal   Collection Time: 02/27/18  8:43 PM  Result Value Ref Range   Sodium 136 135 - 145 mmol/L   Potassium 3.6 3.5 - 5.1 mmol/L   Chloride 106 101 - 111 mmol/L   CO2 19 (L) 22 - 32 mmol/L   Glucose, Bld  99 65 - 99 mg/dL   BUN 16 6 - 20 mg/dL   Creatinine, Ser 1.12 0.61 - 1.24 mg/dL   Calcium 8.8 (L) 8.9 - 10.3 mg/dL   GFR calc non Af Amer >60 >60 mL/min   GFR calc Af Amer >60 >60 mL/min    Comment: (NOTE) The eGFR has been calculated using the CKD EPI equation. This calculation has not been validated in all clinical situations. eGFR's persistently <60 mL/min signify possible Chronic Kidney Disease.    Anion gap 11 5 - 15    Comment: Performed at [facility], [address] [place], [place] [address]  CBC     Status: Abnormal   Collection Time: 02/27/18  8:43 PM  Result Value Ref Range   WBC 16.2 (H) 3.8 - 10.6 K/uL   RBC 5.63 4.40 - 5.90 MIL/uL   Hemoglobin 13.5 13.0 - 18.0 g/dL   HCT 42.3 40.0 - 52.0 %   MCV 75.2 (L) 80.0 - 100.0 fL   MCH 24.0 (L) 26.0 - 34.0 pg   MCHC 31.9 (L) 32.0 - 36.0 g/dL   RDW 16.0 (H) 11.5 - 14.5 %   Platelets 179 150 - 440 K/uL    Comment: Performed at [facility], [address] [place], [place] [address]  Urinalysis, Complete w Microscopic     Status: Abnormal   Collection Time: 02/27/18  8:43 PM  Result Value Ref Range   Color, Urine YELLOW (A) YELLOW   APPearance CLEAR (A) CLEAR   Specific Gravity, Urine 1.028 1.005 - 1.030   pH 5.0 5.0 - 8.0   Glucose, UA NEGATIVE NEGATIVE mg/dL   Hgb urine dipstick MODERATE (A) NEGATIVE   Bilirubin Urine NEGATIVE NEGATIVE   Ketones, ur NEGATIVE NEGATIVE mg/dL   Protein, ur 30 (A) NEGATIVE mg/dL   Nitrite NEGATIVE NEGATIVE   Leukocytes, UA NEGATIVE NEGATIVE   RBC / HPF 6-30 0 - 5 RBC/hpf   WBC, UA 0-5 0 - 5 WBC/hpf    Bacteria, UA NONE SEEN NONE SEEN   Squamous Epithelial / LPF NONE SEEN NONE SEEN   Mucus PRESENT     Comment: Performed at [facility], [address] [place], [place] [address]  Lactic acid, plasma     Status: None   Collection Time: 02/27/18  8:43 PM  Result Value Ref Range   Lactic Acid, Venous 0.9 0.5 - 1.9 mmol/L    Comment: Performed at [facility], [address] [place], [place] [address]  Troponin I     Status: None   Collection Time: 02/27/18  8:43 PM  Result Value Ref Range   Troponin I <0.03 <0.03 ng/mL    Comment: Performed at [facility], [address] [place], [place] [address]  Lactic acid, plasma     Status: None   Collection Time: 02/27/18 11:54 PM  Result Value Ref Range   Lactic Acid, Venous 1.0 0.5 - 1.9 mmol/L    Comment: Performed at [facility], [address] [place],  [address]  Influenza panel by PCR (type A & B)     Status: Abnormal   Collection Time: 02/28/18 12:41 AM  Result Value Ref Range   Influenza A By PCR POSITIVE (A) NEGATIVE   Influenza B By PCR NEGATIVE NEGATIVE    Comment: (NOTE) The Xpert Xpress Flu assay is intended as an aid in the diagnosis of  influenza and should not be used as a sole basis for treatment.  This  assay is FDA approved for nasopharyngeal swab specimens only. Nasal  washings and aspirates are unacceptable for Xpert Xpress Flu testing. Performed at [facility], [address] [place], [place] [address]   Comprehensive metabolic panel     Status: Abnormal   Collection Time: 03/04/18  8:40 AM  Result Value Ref Range   Sodium 139 135 - 145 mmol/L   Potassium 4.3 3.5 - 5.1 mmol/L   Chloride 107 101 - 111 mmol/L   CO2 25 22 - 32 mmol/L   Glucose, Bld 78 65 - 99 mg/dL   BUN 12 6 - 20 mg/dL   Creatinine, Ser 0.95 0.61 - 1.24 mg/dL   Calcium 8.6 (L) 8.9 - 10.3 mg/dL   Total Protein 6.4 (L) 6.5 - 8.1 g/dL   Albumin 3.6 3.5 - 5.0 g/dL   AST 25 15 - 41 U/L     ALT 16 (L) 17 - 63 U/L   Alkaline Phosphatase 50 38 - 126 U/L   Total Bilirubin 0.5 0.3 - 1.2 mg/dL   GFR calc non Af Amer >60 >60 mL/min   GFR calc Af Amer >60 >60 mL/min    Comment: (NOTE) The eGFR has been calculated using the CKD EPI equation. This calculation has not been validated in all clinical situations. eGFR's persistently <60 mL/min signify possible Chronic Kidney Disease.    Anion gap 7 5 - 15    Comment: Performed at [facility], [address] [place], [place] [address]  CBC with Differential/Platelet     Status: Abnormal   Collection Time: 03/04/18  8:40 AM  Result Value Ref Range   WBC 18.2 (H) 3.8 - 10.6 K/uL   RBC 4.67 4.40 - 5.90 MIL/uL   Hemoglobin 11.4 (L) 13.0 - 18.0 g/dL   HCT 35.0 (L) 40.0 - 52.0 %   MCV 75.1 (L) 80.0 - 100.0 fL   MCH 24.4 (L) 26.0 - 34.0 pg   MCHC 32.5 32.0 - 36.0 g/dL   RDW 15.9 (H) 11.5 - 14.5 %   Platelets 144 (L) 150 - 440 K/uL   Neutrophils Relative % 16 %   Neutro Abs 2.9 1.4 - 6.5 K/uL   Lymphocytes Relative 79 %   Lymphs Abs 14.3 (H) 1.0 - 3.6 K/uL   Monocytes Relative 3 %   Monocytes Absolute 0.6 0.2 - 1.0 K/uL   Eosinophils Relative 2 %   Eosinophils Absolute 0.3 0 - 0.7 K/uL   Basophils Relative 0 %   Basophils Absolute 0.1 0 - 0.1 K/uL    Comment: Performed at [facility], [address] [place], [place] [address]    Assessment/Plan:  Coronary atherosclerosis No lifestyle limiting angina symptoms currently.  Claudication [facility]) The patient describes symptoms of claudication.  We discussed that the most common cause of claudication symptoms is from arterial insufficiency, but neurogenic claudication can create similar symptoms.  I discussed the pathophysiology and natural history of peripheral arterial disease.  I have recommended we obtain noninvasive studies to assess his arterial system at the near future at his convenience.  I will see the patient back following the studies to discuss the results  and determine further treatment options.      Leotis Pain 04/19/2018, 2:05 PM   This note was created with Dragon medical transcription system.  Any errors from dictation are unintentional.

## 2018-04-19 NOTE — Assessment & Plan Note (Signed)
No lifestyle limiting angina symptoms currently.

## 2018-04-19 NOTE — Patient Instructions (Signed)

## 2018-04-19 NOTE — Telephone Encounter (Signed)
Patient states Mucinex didn't help cough , still in chest. no fever.  You mentioned to patient would call in antibiotic.   Last seen on 04/01/18

## 2018-04-19 NOTE — Assessment & Plan Note (Signed)
The patient describes symptoms of claudication.  We discussed that the most common cause of claudication symptoms is from arterial insufficiency, but neurogenic claudication can create similar symptoms.  I discussed the pathophysiology and natural history of peripheral arterial disease.  I have recommended we obtain noninvasive studies to assess his arterial system at the near future at his convenience.  I will see the patient back following the studies to discuss the results and determine further treatment options.

## 2018-04-20 MED ORDER — AMOXICILLIN-POT CLAVULANATE 875-125 MG PO TABS: 1.0000 | ORAL_TABLET | Freq: Two times a day (BID) | ORAL | 0 refills | Status: AC

## 2018-04-20 NOTE — Telephone Encounter (Signed)
Call pt  I have sent in augmentin Start probiotics Continue muxinex Referral to pulmonology for evaluation since chronic cough since 12/2017 Let us know if not better and if doesn't hear from [name] regarding referral  No abx in 3 months

## 2018-04-20 NOTE — Telephone Encounter (Signed)
Tried calling patient no voice mail set up

## 2018-04-21 ENCOUNTER — Other Ambulatory Visit (INDEPENDENT_AMBULATORY_CARE_PROVIDER_SITE_OTHER): Payer: BLUE CROSS/BLUE SHIELD

## 2018-04-21 DIAGNOSIS — M79604 Pain in right leg: Secondary | ICD-10-CM

## 2018-04-21 DIAGNOSIS — M79605 Pain in left leg: Secondary | ICD-10-CM | POA: Diagnosis not present

## 2018-04-21 NOTE — Telephone Encounter (Addendum)
Patient advised and verbalized understanding 

## 2018-04-22 LAB — HEMOGLOBIN A1C: HEMOGLOBIN A1C: 5.8 % (ref 4.6–6.5)

## 2018-04-22 LAB — B12 AND FOLATE PANEL
Folate: 9.1 ng/mL (ref >5.9)
Vitamin B-12: 288 pg/mL (ref 211–911)

## 2018-04-22 LAB — TSH: TSH: 3.05 u[IU]/mL (ref 0.35–4.50)

## 2018-05-05 ENCOUNTER — Other Ambulatory Visit (INDEPENDENT_AMBULATORY_CARE_PROVIDER_SITE_OTHER): Payer: BLUE CROSS/BLUE SHIELD

## 2018-05-05 ENCOUNTER — Encounter (INDEPENDENT_AMBULATORY_CARE_PROVIDER_SITE_OTHER): Payer: Self-pay

## 2018-05-05 ENCOUNTER — Ambulatory Visit (INDEPENDENT_AMBULATORY_CARE_PROVIDER_SITE_OTHER): Payer: BLUE CROSS/BLUE SHIELD | Admitting: Vascular Surgery

## 2018-05-05 ENCOUNTER — Encounter (INDEPENDENT_AMBULATORY_CARE_PROVIDER_SITE_OTHER): Payer: Self-pay | Admitting: Vascular Surgery

## 2018-05-05 VITALS — BP 129/78 | HR 60 | Resp 17 | Ht 68.0 in | Wt 131.6 lb

## 2018-05-05 DIAGNOSIS — M79604 Pain in right leg: Secondary | ICD-10-CM

## 2018-05-05 DIAGNOSIS — Z87898 Personal history of other specified conditions: Secondary | ICD-10-CM

## 2018-05-05 DIAGNOSIS — E785 Hyperlipidemia, unspecified: Secondary | ICD-10-CM | POA: Diagnosis not present

## 2018-05-05 DIAGNOSIS — I739 Peripheral vascular disease, unspecified: Secondary | ICD-10-CM | POA: Diagnosis not present

## 2018-05-05 DIAGNOSIS — M79605 Pain in left leg: Secondary | ICD-10-CM

## 2018-05-05 NOTE — Progress Notes (Signed)
Subjective:    Patient ID: [name], male    DOB: 01/06/1951, 67 y.o.   MRN: [id number] Chief Complaint  Patient presents with  . Follow-up    pt conv abi   Patient presents to review vascular studies.  The patient was last seen on April 30 for evaluation of worsening claudication.  The patient notes progressively worsening claudication to the bilateral lower extremities.  The patient does have a past medical history of peripheral artery disease status post endovascular intervention.  The patient notes his claudication distance has shortened over the last few months.  The patient experiences significant pain which causes him to stop the activity that he is participating in the pain will then resolve.  Once the patient starts activity again the pain recurs.  The patient states that his symptoms have become lifestyle limiting.  The patient notes that his symptoms are interfering with his ability to function on a daily basis.  The patient underwent a bilateral lower extremity arterial Doppler which was notable for monophasic blood flow from the bilateral popliteal arteries distally.  Right toe waveforms not detected.  Left toe waveforms abnormal.  Patient's last intervention was December 18, 2013 to the left lower extremity.  Patient denies any ulcer formation.  Patient denies any fever, nausea vomiting.  Review of Systems  Constitutional: Negative.   HENT: Negative.   Eyes: Negative.   Respiratory: Negative.   Cardiovascular:       Claudication PAD  Gastrointestinal: Negative.   Endocrine: Negative.   Genitourinary: Negative.   Musculoskeletal: Negative.   Skin: Negative.   Allergic/Immunologic: Negative.   Neurological: Negative.   Hematological: Negative.   Psychiatric/Behavioral: Negative.       Objective:   Physical Exam  Constitutional: He is oriented to person, place, and time. He appears well-developed and well-nourished. No distress.  HENT:  Head: Normocephalic and  atraumatic.  Right Ear: External ear normal.  Left Ear: External ear normal.  Eyes: Pupils are equal, round, and reactive to light. Conjunctivae and EOM are normal.  Cardiovascular: Normal rate, regular rhythm, normal heart sounds and intact distal pulses.  Pulses:      Radial pulses are 2+ on the right side, and 2+ on the left side.  Hard to palpate pedal pulses however the bilateral feet are warm  Pulmonary/Chest: Effort normal and breath sounds normal.  Musculoskeletal: Normal range of motion. He exhibits no edema.  Neurological: He is alert and oriented to person, place, and time.  Skin: Skin is warm and dry. He is not diaphoretic.  Psychiatric: He has a normal mood and affect. His behavior is normal. Judgment and thought content normal.  Vitals reviewed.  BP 129/78 (BP Location: Right Arm)   Pulse 60   Resp 17   Ht 5\' 8"  (1.727 m)   Wt 131 lb 9.6 oz (59.7 kg)   BMI 20.01 kg/m   Past Medical History:  Diagnosis Date  . Anemia 2008  . Arthritis   . Cervicalgia   . Chronic lymphocytic leukemia (CLL), B-cell ([place]) 06/15/2015   Dr. Tasia Catchings  . Hyperlipidemia   . Low back pain   . Peripheral vascular disease ([place])    Social History   Socioeconomic History  . Marital status: Married    Spouse name: Not on file  . Number of children: Not on file  . Years of education: Not on file  . Highest education level: Not on file  Occupational History  . Not on file  Social  Needs  . Financial resource strain: Not on file  . Food insecurity:    Worry: Not on file    Inability: Not on file  . Transportation needs:    Medical: Not on file    Non-medical: Not on file  Tobacco Use  . Smoking status: Current Some Day Smoker    Types: Cigars  . Smokeless tobacco: Never Used  . Tobacco comment: quit cigarettes 30 years ago used to smoke 1 pk last 1 week no FH lung cancer   Substance and Sexual Activity  . Alcohol use: No  . Drug use: No  . Sexual activity: Never  Lifestyle  .  Physical activity:    Days per week: Not on file    Minutes per session: Not on file  . Stress: Not on file  Relationships  . Social connections:    Talks on phone: Not on file    Gets together: Not on file    Attends religious service: Not on file    Active member of club or organization: Not on file    Attends meetings of clubs or organizations: Not on file    Relationship status: Not on file  . Intimate partner violence:    Fear of current or ex partner: Not on file    Emotionally abused: Not on file    Physically abused: Not on file    Forced sexual activity: Not on file  Other Topics Concern  . 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       Past Surgical History:  Procedure Laterality Date  . COLONOSCOPY    . COLONOSCOPY WITH PROPOFOL N/A 07/23/2015   Procedure: COLONOSCOPY WITH PROPOFOL;  Surgeon: [name], MD;  Location: [facility] ENDOSCOPY;  Service: Endoscopy;  Laterality: N/A;  . ESOPHAGOGASTRODUODENOSCOPY (EGD) WITH PROPOFOL N/A 08/20/2015   Procedure: ESOPHAGOGASTRODUODENOSCOPY (EGD) WITH PROPOFOL;  Surgeon: [name], MD;  Location: [facility] ENDOSCOPY;  Service: Endoscopy;  Laterality: N/A;  . lymp node removal Right 2011   arm   left arm 2015  . LYMPH NODE BIOPSY  08/2014   Family History  Problem Relation Age of Onset  . Kidney cancer Neg Hx        lung cancer  . Bladder Cancer Neg Hx   . Prostate cancer Neg Hx    Allergies  Allergen Reactions  . Latex Rash      Assessment & Plan:  Patient presents to review vascular studies.  The patient was last seen on April 30 for evaluation of worsening claudication.  The patient notes progressively worsening claudication to the bilateral lower extremities.  The patient does have a past medical history of peripheral artery disease status post endovascular intervention.  The patient notes his claudication distance has shortened over the last few months.  The  patient experiences significant pain which causes him to stop the activity that he is participating in the pain will then resolve.  Once the patient starts activity again the pain recurs.  The patient states that his symptoms have become lifestyle limiting.  The patient notes that his symptoms are interfering with his ability to function on a daily basis.  The patient underwent a bilateral lower extremity arterial Doppler which was notable for monophasic blood flow from the bilateral popliteal arteries distally.  Right toe waveforms not detected.  Left toe waveforms abnormal.  Patient's last intervention was December 18, 2013 to the left lower  extremity.  Patient denies any ulcer formation.  Patient denies any fever, nausea vomiting.  1) PAD - Stable Patient with worsening bilateral lower extremity claudication Hard to palpate pedal pulses on exam Monophasic blood flow to the bilateral popliteal and tibial arteries absent waveforms to the right toes abnormal waveforms to the left toes Recommend a left lower extremity angiogram with possible intervention to assess the patient's anatomy and degree of peripheral artery disease.  If appropriate at that time an attempt to revascularize the extremity can be made.  The patient would then also benefit from undergoing a right lower extremity angiogram for the same reasons Procedure, risks and benefits explained to the patient Patient is answered She wishes to proceed  2) Hyperlipidemia - Stable Encouraged good control as its slows the progression of atherosclerotic disease  3) Pre-Diabetic - Stable Encouraged good control as its slows the progression of atherosclerotic disease  Current Outpatient Medications on File Prior to Visit  Medication Sig Dispense Refill  . albuterol (PROVENTIL HFA;VENTOLIN HFA) 108 (90 Base) MCG/ACT inhaler Inhale 1-2 puffs into the lungs every 6 (six) hours as needed for wheezing or shortness of breath. 1 Inhaler 11  .  gabapentin (NEURONTIN) 300 MG capsule Take 1 capsule (300 mg total) by mouth 3 (three) times daily. 90 capsule 1  . MYRBETRIQ 25 MG TB24 tablet Take 1 tablet (25 mg total) by mouth daily. 90 tablet 0  . omeprazole (PRILOSEC) 20 MG capsule Take 1 capsule (20 mg total) by mouth daily. 30 min b/f food 90 capsule 0  . pravastatin (PRAVACHOL) 40 MG tablet Take 1 tablet (40 mg total) by mouth daily. 90 tablet 3  . predniSONE (DELTASONE) 20 MG tablet Take 20 mg by mouth daily with breakfast.     No current facility-administered medications on file prior to visit.    There are no Patient Instructions on file for this visit. No follow-ups on file.  Ginamarie Banfield A Mykia Holton, PA-C

## 2018-05-06 ENCOUNTER — Telehealth (INDEPENDENT_AMBULATORY_CARE_PROVIDER_SITE_OTHER): Payer: Self-pay | Admitting: Vascular Surgery

## 2018-05-06 ENCOUNTER — Telehealth (INDEPENDENT_AMBULATORY_CARE_PROVIDER_SITE_OTHER): Payer: Self-pay

## 2018-05-06 NOTE — Telephone Encounter (Signed)
Spoke with patient regarding his procedures, he returned a call from [place] and just wanted to confirm his procedure appts and pre-op appts.

## 2018-05-06 NOTE — Telephone Encounter (Signed)
Called the patient to inform him that I have written the letter for his job he requested and that it is at the front desk waiting for him to be picked up.  Unfortunately, the patient's voicemail is not set up.

## 2018-05-10 ENCOUNTER — Other Ambulatory Visit (INDEPENDENT_AMBULATORY_CARE_PROVIDER_SITE_OTHER): Payer: Self-pay | Admitting: Vascular Surgery

## 2018-05-11 ENCOUNTER — Telehealth (INDEPENDENT_AMBULATORY_CARE_PROVIDER_SITE_OTHER): Payer: Self-pay

## 2018-05-11 ENCOUNTER — Telehealth: Payer: Self-pay

## 2018-05-11 NOTE — Telephone Encounter (Signed)
Spoke with patient he states he was seen by Dr Lucky Cowboy and they did ultrasound on his legs and showed blockages. He is scheduled for surgery on 06/19/18 and 06/02/18 to remove buildup . He would like work note for light duty until surgery for job due to job  duties.  I inquired with patient to see if he has contacted Dr Bunnie Domino office. He stated that he had , however he hasn't heard back from office. I told patient that I would follow up with Dr Bunnie Domino office on status. I left message on nurse line at Dr Bunnie Domino office.

## 2018-05-11 NOTE — Telephone Encounter (Signed)
Cyril Mourning called to see if we were working on a [employer]" restriction note for this patient who will be having surgery on May 30 and June 13th. He has called to their office requesting this letter as well, but since we are doing the surgery, she thinks that he needs to get this letter form [name].  I will get with Dr. Lucky Cowboy tomorrow or Friday to see if he's going to okay the work note before the surgery.

## 2018-05-11 NOTE — Telephone Encounter (Signed)
Copied from [place] [id number]. Topic: General - Other >> May 11, 2018 12:48 PM Yvette Rack wrote: Reason for CRM: Pt states he will be having surgery on 05/19/18 and another surgery on 06/02/18 and he would like to discuss work schedule. Pt requested call back at [phone].

## 2018-05-12 ENCOUNTER — Encounter (INDEPENDENT_AMBULATORY_CARE_PROVIDER_SITE_OTHER): Payer: Self-pay

## 2018-05-13 ENCOUNTER — Encounter (INDEPENDENT_AMBULATORY_CARE_PROVIDER_SITE_OTHER): Payer: Self-pay

## 2018-05-13 NOTE — Telephone Encounter (Signed)
Called the patient to let him know that his letter for "light duty" work is waiting for him at the front desk for pick up. He was appreciative, and said that he will be by to pick up the letter in the next couple of days.

## 2018-05-17 ENCOUNTER — Inpatient Hospital Stay: Admission: RE | Admit: 2018-05-17 | Payer: BLUE CROSS/BLUE SHIELD | Source: Ambulatory Visit

## 2018-05-18 MED ORDER — CEFAZOLIN SODIUM-DEXTROSE 2-4 GM/100ML-% IV SOLN
2.0000 g | Freq: Once | INTRAVENOUS | Status: AC
  Administered 2018-05-19: 2 g via INTRAVENOUS

## 2018-05-19 ENCOUNTER — Ambulatory Visit
Admission: RE | Admit: 2018-05-19 | Discharge: 2018-05-19 | Disposition: A | Discharge status: Home / Self Care | Payer: BLUE CROSS/BLUE SHIELD | Source: Ambulatory Visit | Attending: Vascular Surgery | Admitting: Vascular Surgery

## 2018-05-19 ENCOUNTER — Encounter
Admission: RE | Disposition: A | Discharge status: Home / Self Care | Payer: Self-pay | Source: Ambulatory Visit | Attending: Vascular Surgery

## 2018-05-19 DIAGNOSIS — Z856 Personal history of leukemia: Secondary | ICD-10-CM | POA: Insufficient documentation

## 2018-05-19 DIAGNOSIS — Z79899 Other long term (current) drug therapy: Secondary | ICD-10-CM | POA: Insufficient documentation

## 2018-05-19 DIAGNOSIS — Z9889 Other specified postprocedural states: Secondary | ICD-10-CM | POA: Insufficient documentation

## 2018-05-19 DIAGNOSIS — I70212 Atherosclerosis of native arteries of extremities with intermittent claudication, left leg: Secondary | ICD-10-CM | POA: Diagnosis not present

## 2018-05-19 DIAGNOSIS — E785 Hyperlipidemia, unspecified: Secondary | ICD-10-CM | POA: Diagnosis not present

## 2018-05-19 DIAGNOSIS — M1990 Unspecified osteoarthritis, unspecified site: Secondary | ICD-10-CM | POA: Insufficient documentation

## 2018-05-19 DIAGNOSIS — Z91040 Latex allergy status: Secondary | ICD-10-CM | POA: Insufficient documentation

## 2018-05-19 DIAGNOSIS — R7303 Prediabetes: Secondary | ICD-10-CM | POA: Insufficient documentation

## 2018-05-19 DIAGNOSIS — I70219 Atherosclerosis of native arteries of extremities with intermittent claudication, unspecified extremity: Secondary | ICD-10-CM

## 2018-05-19 DIAGNOSIS — F17290 Nicotine dependence, other tobacco product, uncomplicated: Secondary | ICD-10-CM | POA: Insufficient documentation

## 2018-05-19 DIAGNOSIS — I70213 Atherosclerosis of native arteries of extremities with intermittent claudication, bilateral legs: Secondary | ICD-10-CM | POA: Insufficient documentation

## 2018-05-19 HISTORY — PX: LOWER EXTREMITY ANGIOGRAPHY: CATH118251

## 2018-05-19 SURGERY — LOWER EXTREMITY ANGIOGRAPHY
Anesthesia: Moderate Sedation | Laterality: Left

## 2018-05-19 MED ORDER — CLOPIDOGREL BISULFATE 75 MG PO TABS: 75.0000 mg | ORAL_TABLET | Freq: Every day | ORAL | Status: DC

## 2018-05-19 MED ORDER — SODIUM CHLORIDE 0.9 % IV SOLN
INTRAVENOUS | Status: DC
  Administered 2018-05-19: 07:00:00 via INTRAVENOUS

## 2018-05-19 MED ORDER — SODIUM CHLORIDE 0.9% FLUSH: 3.0000 mL | Freq: Two times a day (BID) | INTRAVENOUS | Status: DC

## 2018-05-19 MED ORDER — CLOPIDOGREL BISULFATE 75 MG PO TABS: 75.0000 mg | ORAL_TABLET | Freq: Every day | ORAL | 11 refills | Status: DC

## 2018-05-19 MED ORDER — ASPIRIN EC 81 MG PO TBEC
81.0000 mg | DELAYED_RELEASE_TABLET | Freq: Every day | ORAL | Status: DC
  Filled 2018-05-19: qty 1

## 2018-05-19 MED ORDER — SODIUM CHLORIDE 0.9% FLUSH: 3.0000 mL | INTRAVENOUS | Status: DC | PRN

## 2018-05-19 MED ORDER — ASPIRIN EC 81 MG PO TBEC: 81.0000 mg | DELAYED_RELEASE_TABLET | Freq: Every day | ORAL | 2 refills | Status: DC

## 2018-05-19 MED ORDER — IOPAMIDOL (ISOVUE-300) INJECTION 61%
INTRAVENOUS | Status: DC | PRN
  Administered 2018-05-19: 75 mL via INTRA_ARTERIAL

## 2018-05-19 MED ORDER — MIDAZOLAM HCL 2 MG/2ML IJ SOLN
INTRAMUSCULAR | Status: AC
  Filled 2018-05-19: qty 2

## 2018-05-19 MED ORDER — METHYLPREDNISOLONE SODIUM SUCC 125 MG IJ SOLR: 125.0000 mg | INTRAMUSCULAR | Status: DC | PRN

## 2018-05-19 MED ORDER — MIDAZOLAM HCL 5 MG/5ML IJ SOLN
INTRAMUSCULAR | Status: AC
  Filled 2018-05-19: qty 5

## 2018-05-19 MED ORDER — CLOPIDOGREL BISULFATE 75 MG PO TABS
ORAL_TABLET | ORAL | Status: AC
  Administered 2018-05-19: 75 mg
  Filled 2018-05-19: qty 1

## 2018-05-19 MED ORDER — HEPARIN (PORCINE) IN NACL 1000-0.9 UT/500ML-% IV SOLN
INTRAVENOUS | Status: AC
  Filled 2018-05-19: qty 1000

## 2018-05-19 MED ORDER — FENTANYL CITRATE (PF) 100 MCG/2ML IJ SOLN
INTRAMUSCULAR | Status: AC
  Filled 2018-05-19: qty 2

## 2018-05-19 MED ORDER — FENTANYL CITRATE (PF) 100 MCG/2ML IJ SOLN
INTRAMUSCULAR | Status: DC | PRN
  Administered 2018-05-19 (×3): 50 ug via INTRAVENOUS
  Administered 2018-05-19: 25 ug via INTRAVENOUS

## 2018-05-19 MED ORDER — HEPARIN SODIUM (PORCINE) 1000 UNIT/ML IJ SOLN
INTRAMUSCULAR | Status: DC | PRN
  Administered 2018-05-19: 4000 [IU] via INTRAVENOUS

## 2018-05-19 MED ORDER — HEPARIN SODIUM (PORCINE) 1000 UNIT/ML IJ SOLN
INTRAMUSCULAR | Status: AC
  Filled 2018-05-19: qty 1

## 2018-05-19 MED ORDER — ACETAMINOPHEN 325 MG PO TABS: 650.0000 mg | ORAL_TABLET | ORAL | Status: DC | PRN

## 2018-05-19 MED ORDER — HYDROMORPHONE HCL 1 MG/ML IJ SOLN: 1.0000 mg | Freq: Once | INTRAMUSCULAR | Status: DC | PRN

## 2018-05-19 MED ORDER — ASPIRIN 81 MG PO CHEW
CHEWABLE_TABLET | ORAL | Status: AC
  Administered 2018-05-19: 81 mg
  Filled 2018-05-19: qty 1

## 2018-05-19 MED ORDER — HYDRALAZINE HCL 20 MG/ML IJ SOLN: 5.0000 mg | INTRAMUSCULAR | Status: DC | PRN

## 2018-05-19 MED ORDER — SODIUM CHLORIDE 0.9 % IV SOLN
INTRAVENOUS | Status: DC
  Administered 2018-05-19: 08:00:00 via INTRAVENOUS

## 2018-05-19 MED ORDER — LABETALOL HCL 5 MG/ML IV SOLN: 10.0000 mg | INTRAVENOUS | Status: DC | PRN

## 2018-05-19 MED ORDER — ONDANSETRON HCL 4 MG/2ML IJ SOLN: 4.0000 mg | Freq: Four times a day (QID) | INTRAMUSCULAR | Status: DC | PRN

## 2018-05-19 MED ORDER — SODIUM CHLORIDE 0.9 % IV SOLN: 250.0000 mL | INTRAVENOUS | Status: DC | PRN

## 2018-05-19 MED ORDER — FAMOTIDINE 20 MG PO TABS: 40.0000 mg | ORAL_TABLET | ORAL | Status: DC | PRN

## 2018-05-19 MED ORDER — MIDAZOLAM HCL 2 MG/2ML IJ SOLN
INTRAMUSCULAR | Status: DC | PRN
  Administered 2018-05-19: 2 mg via INTRAVENOUS
  Administered 2018-05-19: 1 mg via INTRAVENOUS
  Administered 2018-05-19: 2 mg via INTRAVENOUS
  Administered 2018-05-19: 1 mg via INTRAVENOUS

## 2018-05-19 SURGICAL SUPPLY — 26 items
BALLN DORADO 5X200X135 (BALLOONS) ×2
BALLN DORADO 6X200X135 (BALLOONS) ×2
BALLN LUTONIX 5X220X130 (BALLOONS) ×2
BALLN ULTRVRSE 3X300X150 (BALLOONS) ×1
BALLN ULTRVRSE 3X300X150 OTW (BALLOONS) ×1
BALLOON DORADO 5X200X135 (BALLOONS) ×1 IMPLANT
BALLOON DORADO 6X200X135 (BALLOONS) ×1 IMPLANT
BALLOON LUTONIX 5X220X130 (BALLOONS) ×1 IMPLANT
BALLOON ULTRVRSE 3X300X150 OTW (BALLOONS) ×1 IMPLANT
CATH BEACON 5 .038 100 VERT TP (CATHETERS) ×2 IMPLANT
CATH CXI 4F 90 DAV (CATHETERS) ×2 IMPLANT
CATH CXI SUPP ANG 4FR 135 (CATHETERS) ×1 IMPLANT
CATH CXI SUPP ANG 4FR 135CM (CATHETERS) ×2
CATH PIG 70CM (CATHETERS) ×2 IMPLANT
COVER PROBE U/S 5X48 (MISCELLANEOUS) ×2 IMPLANT
DEVICE PRESTO INFLATION (MISCELLANEOUS) ×2 IMPLANT
DEVICE STARCLOSE SE CLOSURE (Vascular Products) ×2 IMPLANT
GLIDEWIRE ADV .035X260CM (WIRE) ×2 IMPLANT
PACK ANGIOGRAPHY (CUSTOM PROCEDURE TRAY) ×2 IMPLANT
SHEATH ANL2 6FRX45 HC (SHEATH) ×2 IMPLANT
SHEATH BRITE TIP 5FRX11 (SHEATH) ×2 IMPLANT
STENT VIABAHN 6X100X120 (Permanent Stent) ×2 IMPLANT
STENT VIABAHN 6X250X120 (Permanent Stent) ×2 IMPLANT
TUBING CONTRAST HIGH PRESS 72 (TUBING) ×2 IMPLANT
WIRE G V18X300CM (WIRE) ×2 IMPLANT
WIRE J 3MM .035X145CM (WIRE) ×2 IMPLANT

## 2018-05-19 NOTE — H&P (Signed)
[facility] History & Physical Update  The patient was interviewed and re-examined.  The patient's previous History and Physical has been reviewed and is unchanged.  There is no change in the plan of care. We plan to proceed with the scheduled procedure.  Leotis Pain, MD  05/19/2018, 8:23 AM

## 2018-05-19 NOTE — Discharge Instructions (Signed)
Groin Insertion Instructions-If you lose feeling or develop tingling or pain in your leg or foot after the procedure, please walk around first.  If the discomfort does not improve , contact your physician and proceed to the nearest emergency room.  Loss of feeling in your leg might mean that a blockage has formed in the artery and this can be appropriately treated.  Limit your activity for the next two days after your procedure.  Avoid stooping, bending, heavy lifting or exertion as this may put pressure on the insertion site.  Resume normal activities in 48 hours.  You may shower after 24 hours but avoid excessive warm water and do not scrub the site.  Remove clear dressing in 48 hours.  If you have had a closure device inserted, do not soak in a tub bath or a hot tub for at least one week. ° °No driving for 48 hours after discharge.  After the procedure, check the insertion site occasionally.  If any oozing occurs or there is apparent swelling, firm pressure over the site will prevent a bruise from forming.  You can not hurt anything by pressing directly on the site.  The pressure stops the bleeding by allowing a small clot to form.  If the bleeding continues after the pressure has been applied for more than 15 minutes, call 911 or go to the nearest emergency room.   ° °The x-ray dye causes you to pass a considerate amount of urine.  For this reason, you will be asked to drink plenty of liquids after the procedure to prevent dehydration.  You may resume you regular diet.  Avoid caffeine products.   ° °For pain at the site of your procedure, take non-aspirin medicines such as Tylenol. ° °Medications: A. Hold Metformin for 48 hours if applicable.  B. Continue taking all your present medications at home unless your doctor prescribes any changes. ° °Moderate Conscious Sedation, Adult, Care After °These instructions provide you with information about caring for yourself after your procedure. Your health care provider  may also give you more specific instructions. Your treatment has been planned according to current medical practices, but problems sometimes occur. Call your health care provider if you have any problems or questions after your procedure. °What can I expect after the procedure? °After your procedure, it is common: °· To feel sleepy for several hours. °· To feel clumsy and have poor balance for several hours. °· To have poor judgment for several hours. °· To vomit if you eat too soon. ° °Follow these instructions at home: °For at least 24 hours after the procedure: ° °· Do not: °? Participate in activities where you could fall or become injured. °? Drive. °? Use heavy machinery. °? Drink alcohol. °? Take sleeping pills or medicines that cause drowsiness. °? Make important decisions or sign legal documents. °? Take care of children on your own. °· Rest. °Eating and drinking °· Follow the diet recommended by your health care provider. °· If you vomit: °? Drink water, juice, or soup when you can drink without vomiting. °? Make sure you have little or no nausea before eating solid foods. °General instructions °· Have a responsible adult stay with you until you are awake and alert. °· Take over-the-counter and prescription medicines only as told by your health care provider. °· If you smoke, do not smoke without supervision. °· Keep all follow-up visits as told by your health care provider. This is important. °Contact a health care provider   if: °· You keep feeling nauseous or you keep vomiting. °· You feel light-headed. °· You develop a rash. °· You have a fever. °Get help right away if: °· You have trouble breathing. °This information is not intended to replace advice given to you by your health care provider. Make sure you discuss any questions you have with your health care provider. °Document Released: 09/27/2013 Document Revised: 05/11/2016 Document Reviewed: 03/28/2016 °Elsevier Interactive Patient Education © 2018  Elsevier Inc. ° °

## 2018-05-19 NOTE — Op Note (Signed)
[name]  Percutaneous Study/Intervention Procedural Note   Date of Surgery: 05/19/2018  Surgeon(s):Adiana Smelcer    Assistants:none  Pre-operative Diagnosis: PAD with claudication BLE  Post-operative diagnosis:  Same  Procedure(s) Performed:             1.  Ultrasound guidance for vascular access right femoral artery             2.  Catheter placement into left anterior tibial artery from right femoral approach             3.  Aortogram and selective left lower extremity angiogram             4.  Percutaneous transluminal angioplasty of left anterior tibial artery with 3 mm balloon             5.  Percutaneous transluminal angioplasty of left SFA and popliteal artery with 5 mm Lutonix drug coated balloon  6.  Viabahn stent placement x2 to the left SFA and popliteal artery with 6 mm diameter by 25 cm length stent and 6 mm diameter by 10 cm length stent             7.  StarClose closure device right femoral artery  EBL: 10 cc  Contrast: 75 cc  Fluoro Time: 10.8 minutes  Moderate Conscious Sedation Time: approximately 60 minutes using 6 mg of Versed and 175 mcg of Fentanyl              Indications:  Patient is a 67 y.o.male with claudication symptoms in both legs. The patient has noninvasive study showing reduced flow bilaterally. The patient is brought in for angiography for further evaluation and potential treatment. Risks and benefits are discussed and informed consent is obtained.   Procedure:  The patient was identified and appropriate procedural time out was performed.  The patient was then placed supine on the table and prepped and draped in the usual sterile fashion. Moderate conscious sedation was administered during a face to face encounter with the patient throughout the procedure with my supervision of the RN administering medicines and monitoring the patient's vital signs, pulse oximetry, telemetry and mental status throughout from the start of the  procedure until the patient was taken to the recovery room. Ultrasound was used to evaluate the right common femoral artery.  It was patent .  A digital ultrasound image was acquired.  A Seldinger needle was used to access the right common femoral artery under direct ultrasound guidance and a permanent image was performed.  A 0.035 J wire was advanced without resistance and a 5Fr sheath was placed.  Pigtail catheter was placed into the aorta and an AP aortogram was performed. This demonstrated normal renal arteries and normal aorta and iliac segments without significant stenosis. I then crossed the aortic bifurcation and advanced to the left femoral head. Selective left lower extremity angiogram was then performed. This demonstrated normal common femoral artery and profunda femoris artery.The SFA became diseased to few centimeters beyond its origin and was occluded above the previously placed stent and through the previously placed stent.  The popliteal artery reconstituted distally.  There was then a dominant posterior tibial runoff into the foot.  There was no focal stenosis in the posterior tibial artery.  The peroneal artery was small and was really not continuous distally.  The anterior tibial artery had a couple of areas of moderate to high-grade stenosis in the 60 to 75% range in the proximal to mid segment  and had sluggish flow distally.  With the inflow lesion, it was difficult to discern if this continued into the foot or not. The patient was systemically heparinized and a 6 [place] Ansell sheath was then placed over the [employer] wire. I then used a Kumpe catheter and the advantage wire to navigate through the SFA and popliteal occlusions and confirm intraluminal flow in the below-knee popliteal artery after exchanging for a CXI catheter.  I then exchanged for a 0.018 wire and advanced across the anterior tibial artery stenoses in the proximal and mid segments.  A 3 mm diameter by 30 cm length  angioplasty balloon was inflated for the anterior tibial artery to the mid to distal segment.  This was taken to 8 atm for 1 minute.  The same 3 mm balloon was used to predilate the SFA and popliteal lesion.  I then used a 5 mm diameter by 22 cm length Lutonix drug-coated angioplasty balloon and inflated this from the popliteal artery at the knee up to the mid SFA.  It was then pulled back to the proximal SFA and a second inflation was performed.  These inflations were 10 to 12 atm for 1 minute.  Completion angiogram showed spiral dissection with several areas of greater than 50% stenosis in the SFA and popliteal arteries.  There was some spasm in the anterior tibial artery but the areas of treated stenosis appeared to be less than 30%.  The anterior tibial artery however was not continuous to the foot even after improving the inflow.  2 Viabahn covered stents were then used to treat the SFA and popliteal arteries from the proximal to mid SFA down to the proximal to mid popliteal artery.  These were 6 mm in diameter and the first was 25 cm in length and the second was 10 cm in length.  A 5 high-pressure balloon was used to post dilate the distal segment and a 6 high-pressure balloon was used to post dilate the more proximal segments.  Completion angiogram showed about a 20% residual stenosis at worst with significantly improved flow distally. I elected to terminate the procedure. The sheath was removed and StarClose closure device was deployed in the right femoral artery with excellent hemostatic result. The patient was taken to the recovery room in stable condition having tolerated the procedure well.  Findings:               Aortogram:  This demonstrated normal renal arteries and normal aorta and iliac segments without significant stenosis             Left Lower Extremity:  The SFA became diseased to few centimeters beyond its origin and was occluded above the previously placed stent and through the previously  placed stent.  The popliteal artery reconstituted distally.  There was then a dominant posterior tibial runoff into the foot.  There was no focal stenosis in the posterior tibial artery.  The peroneal artery was small and was really not continuous distally.  The anterior tibial artery had a couple of areas of moderate to high-grade stenosis in the 60 to 75% range in the proximal to mid segment and had sluggish flow distally.  With the inflow lesion, it was difficult to discern if this continued into the foot or not.   Disposition: Patient was taken to the recovery room in stable condition having tolerated the procedure well.  Complications: None  [name] 05/19/2018 9:36 AM   This note was created with [facility]  transcription system. Any errors in dictation are purely unintentional.

## 2018-05-19 NOTE — Progress Notes (Signed)
Patient remains clinically stable post procedure. Right groin without bleeding nor hematoma, discharge instructions given with questions answered. Denies complaints. Given plavix and aspirin dose earlier.

## 2018-05-19 NOTE — Progress Notes (Signed)
Patient clinically stable post lower extremity angiogram per DR Lucky Cowboy. Vitals remain stable. Right groin without bleeding nor hematoma. Denies complaints. Wife at bedside. Dr Lucky Cowboy out to speak with patient and wife regarding procedure with questions answered.

## 2018-05-23 ENCOUNTER — Telehealth (INDEPENDENT_AMBULATORY_CARE_PROVIDER_SITE_OTHER): Payer: Self-pay

## 2018-05-23 ENCOUNTER — Telehealth: Payer: Self-pay | Admitting: Family

## 2018-05-23 ENCOUNTER — Telehealth (INDEPENDENT_AMBULATORY_CARE_PROVIDER_SITE_OTHER): Payer: Self-pay | Admitting: Vascular Surgery

## 2018-05-23 NOTE — Telephone Encounter (Signed)
Called the patient to let him know that I called in Tramadol, one tab po every six hours as needed for pain with one refill to the [facility] on [address]. The patient did not have his voicemail setup, but the the medication was called in.

## 2018-05-23 NOTE — Telephone Encounter (Signed)
Patient called stated that he had a procedure done last Thursday, he says that he has been taking Motrin and that they are not touching his pain anymore and that his pain is worsening with the Motrin.  He wants to know if there is something stronger that can be called in for him to take.

## 2018-05-23 NOTE — Telephone Encounter (Signed)
Patient said that he needs something for pain because his leg is killing him. Said he had surgery thursday. [place] on [address]. He can be reached at the number in the system.

## 2018-05-23 NOTE — Telephone Encounter (Signed)
Called patient and he states that he had a stent placed on Thursday. He has been taking moltrin with some relief but can only take it every few hours. He states that pain is achy. There is no bruising , swelling , bleeding or warmth at the sight. No chest pain for shortness of breath or pain with movement. He has called the office where he had it done and he was told that they would give a message to the doctor that did the procedure but he has not heard back from them yet. He is wanting to know if Joycelyn Schmid could send him in something for the pain.

## 2018-05-23 NOTE — Telephone Encounter (Signed)
Copied from [place] [phone]. Topic: Quick Communication - See Telephone Encounter >> May 23, 2018  4:09 PM Robina Ade, Helene Kelp D wrote: CRM for notification. See Telephone encounter for: 05/23/18. Patient called and said that he had surgery on Thursday on his legs and he is in a lot of pain. He has already contacted where he had the procedure but they have not got back to him and wants to know if NP Arnett can help him out with this. Please call patient back, thanks.

## 2018-05-23 NOTE — Telephone Encounter (Signed)
His prescription was called into the pharmacy at [facility] on [address]. I tried to call him and leave a message but he did not have his voicemail setup.

## 2018-05-24 NOTE — Telephone Encounter (Signed)
Spoke with patient he states that Dr Bunnie Domino office called and prescribed him some pain medication.  He states he is doing well from from surgery except from pain .

## 2018-05-24 NOTE — Telephone Encounter (Signed)
Thanks for being so on top of this !!  Glad Dr Lucky Cowboy got back to him.

## 2018-05-24 NOTE — Telephone Encounter (Signed)
Spoke with patient he stated that Dr Bunnie Domino offices completed paperwork in regards to light duty.

## 2018-05-30 ENCOUNTER — Ambulatory Visit: Payer: BLUE CROSS/BLUE SHIELD | Admitting: Family

## 2018-05-30 ENCOUNTER — Telehealth: Payer: Self-pay | Admitting: Radiology

## 2018-05-30 ENCOUNTER — Ambulatory Visit (INDEPENDENT_AMBULATORY_CARE_PROVIDER_SITE_OTHER): Payer: BLUE CROSS/BLUE SHIELD

## 2018-05-30 ENCOUNTER — Ambulatory Visit: Payer: Self-pay | Admitting: *Deleted

## 2018-05-30 ENCOUNTER — Encounter: Payer: Self-pay | Admitting: Family

## 2018-05-30 VITALS — BP 130/74 | HR 45 | Temp 98.6°F | Wt 131.4 lb

## 2018-05-30 DIAGNOSIS — K219 Gastro-esophageal reflux disease without esophagitis: Secondary | ICD-10-CM

## 2018-05-30 DIAGNOSIS — R634 Abnormal weight loss: Secondary | ICD-10-CM

## 2018-05-30 DIAGNOSIS — R001 Bradycardia, unspecified: Secondary | ICD-10-CM | POA: Diagnosis not present

## 2018-05-30 DIAGNOSIS — R05 Cough: Secondary | ICD-10-CM | POA: Diagnosis not present

## 2018-05-30 DIAGNOSIS — R530 Neoplastic (malignant) related fatigue: Secondary | ICD-10-CM

## 2018-05-30 DIAGNOSIS — R509 Fever, unspecified: Secondary | ICD-10-CM

## 2018-05-30 LAB — CBC WITH DIFFERENTIAL/PLATELET
BASOS ABS: 0.1 10*3/uL (ref 0.0–0.1)
BASOS PCT: 0.3 % (ref 0.0–3.0)
EOS ABS: 0.4 10*3/uL (ref 0.0–0.7)
Eosinophils Relative: 2.1 % (ref 0.0–5.0)
HEMATOCRIT: 35.3 % — AB (ref 39.0–52.0)
Hemoglobin: 11.1 g/dL — ABNORMAL LOW (ref 13.0–17.0)
LYMPHS PCT: 71 % — AB (ref 12.0–46.0)
Lymphs Abs: 13.6 10*3/uL — ABNORMAL HIGH (ref 0.7–4.0)
MCHC: 31.4 g/dL (ref 30.0–36.0)
MCV: 76.7 fl — AB (ref 78.0–100.0)
Monocytes Absolute: 0.6 10*3/uL (ref 0.1–1.0)
Monocytes Relative: 3.4 % (ref 3.0–12.0)
NEUTROS ABS: 4.5 10*3/uL (ref 1.4–7.7)
NEUTROS PCT: 23.2 % — AB (ref 43.0–77.0)
PLATELETS: 459 10*3/uL — AB (ref 150.0–400.0)
RBC: 4.6 Mil/uL (ref 4.22–5.81)
RDW: 15.4 % (ref 11.5–15.5)
WBC: 19.2 10*3/uL (ref 4.0–10.5)

## 2018-05-30 LAB — VITAMIN D 25 HYDROXY (VIT D DEFICIENCY, FRACTURES): VITD: 16.49 ng/mL — AB (ref 30.00–100.00)

## 2018-05-30 LAB — COMPREHENSIVE METABOLIC PANEL
ALT: 16 U/L (ref 0–53)
AST: 22 U/L (ref 0–37)
Albumin: 3.8 g/dL (ref 3.5–5.2)
Alkaline Phosphatase: 54 U/L (ref 39–117)
BUN: 12 mg/dL (ref 6–23)
CALCIUM: 9.5 mg/dL (ref 8.4–10.5)
CHLORIDE: 103 meq/L (ref 96–112)
CO2: 29 meq/L (ref 19–32)
Creatinine, Ser: 1.04 mg/dL (ref 0.40–1.50)
GFR: 92.21 mL/min (ref >60.00)
Glucose, Bld: 91 mg/dL (ref 70–99)
Potassium: 4.5 mEq/L (ref 3.5–5.1)
Sodium: 138 mEq/L (ref 135–145)
Total Bilirubin: 0.4 mg/dL (ref 0.2–1.2)
Total Protein: 7.3 g/dL (ref 6.0–8.3)

## 2018-05-30 LAB — POC INFLUENZA A&B (BINAX/QUICKVUE)
INFLUENZA A, POC: NEGATIVE
Influenza B, POC: NEGATIVE

## 2018-05-30 LAB — POCT RAPID STREP A (OFFICE): RAPID STREP A SCREEN: NEGATIVE

## 2018-05-30 LAB — TSH: TSH: 2.93 u[IU]/mL (ref 0.35–4.50)

## 2018-05-30 MED ORDER — RANITIDINE HCL 150 MG PO TABS: 150.0000 mg | ORAL_TABLET | Freq: Two times a day (BID) | ORAL | 2 refills | Status: DC

## 2018-05-30 NOTE — Telephone Encounter (Signed)
Noted Close to patient's baseline  H/o CLL

## 2018-05-30 NOTE — Assessment & Plan Note (Signed)
Approximately 8 pound weight loss over the past 7 to 8 months.  He does describe that his leg pain did affect his appetite.  Hoping this will improve.  Also think acid reflux was deterring him from eating.  Advised to go ahead and start Zantac over-the-counter.  Follow-up in 2 to 3 months

## 2018-05-30 NOTE — Patient Instructions (Addendum)
Please start zantac twice daily for reflux- think vomiting, acid taste is from reflux.  Actually, I know we discussed Prilosec however you may not take Prilosec since you are Plavix;  Prilosec may interfere with this. Zantac is safe to take.  Labs today  Please to extra vigilant, and you do not feel well, I would strongly advise you speaking with Dr. Lucky Cowboy about pending surgery.  Please also make sure you have a follow-up appointment scheduled with Dr. Tasia Catchings.     Food Choices for Gastroesophageal Reflux Disease, Adult When you have gastroesophageal reflux disease (GERD), the foods you eat and your eating habits are very important. Choosing the right foods can help ease your discomfort. What guidelines do I need to follow?  Choose fruits, vegetables, whole grains, and low-fat dairy products.  Choose low-fat meat, fish, and poultry.  Limit fats such as oils, salad dressings, butter, nuts, and avocado.  Keep a food diary. This helps you identify foods that cause symptoms.  Avoid foods that cause symptoms. These may be different for everyone.  Eat small meals often instead of 3 large meals a day.  Eat your meals slowly, in a place where you are relaxed.  Limit fried foods.  Cook foods using methods other than frying.  Avoid drinking alcohol.  Avoid drinking large amounts of liquids with your meals.  Avoid bending over or lying down until 2-3 hours after eating. What foods are not recommended? These are some foods and drinks that may make your symptoms worse: Vegetables Tomatoes. Tomato juice. Tomato and spaghetti sauce. Chili peppers. Onion and garlic. Horseradish. Fruits Oranges, grapefruit, and lemon (fruit and juice). Meats High-fat meats, fish, and poultry. This includes hot dogs, ribs, ham, sausage, salami, and bacon. Dairy Whole milk and chocolate milk. Sour cream. Cream. Butter. Ice cream. Cream cheese. Drinks Coffee and tea. Bubbly (carbonated) drinks or energy  drinks. Condiments Hot sauce. Barbecue sauce. Sweets/Desserts Chocolate and cocoa. Donuts. Peppermint and spearmint. Fats and Oils High-fat foods. This includes [place] fries and potato chips. Other Vinegar. Strong spices. This includes black pepper, white pepper, red pepper, cayenne, curry powder, cloves, ginger, and chili powder. The items listed above may not be a complete list of foods and drinks to avoid. Contact your dietitian for more information. This information is not intended to replace advice given to you by your health care provider. Make sure you discuss any questions you have with your health care provider. Document Released: 06/07/2012 Document Revised: 05/14/2016 Document Reviewed: 10/11/2013 Elsevier Interactive Patient Education  2017 [employer].

## 2018-05-30 NOTE — Assessment & Plan Note (Signed)
Patient is well-appearing today.  No acute respiratory distress.  Flu, strep negative.  Pending chest x-ray, labs at time of note.  With patient's history of CLL, I advised him if he does not feel well in the next couple days, to go ahead and postpone his vascular surgery of his right leg which is in 4 days.  Etiology of his complaints are nonspecific at this time, although I am reassured by exam.  Pending further work-up at this time.

## 2018-05-30 NOTE — Telephone Encounter (Signed)
Patient has been scheduled to see Ledell Noss on 05-30-18.

## 2018-05-30 NOTE — Progress Notes (Signed)
Subjective:    Patient ID: [name], male    DOB: Sep 18, 1951, 67 y.o.   MRN: [id number]  CC: [name] is a 67 y.o. male who presents today for an acute visit.    HPI: CC: Chills x one day.   HA yesterday, resolved  Now. Took ibuprofen with resolve.  Endorses achey. Little bit of congestion- describes thin and almost yellow. Doesn't feel flu. No sorethroat, fever, cough, sob.   Pending surgery 4 days with vascular in right leg  approx 2 weeks ago had vascular surgery in left leg. Has felt fatigue. Left leg pain resolved.    CCL- Dr Tasia Catchings  Concerned with weight  Loss; notes loss of appetite. Thinks that pain in left leg decreased appetite. Didn't want to eat when in pain post op. Swelling resolved. No longer on any pain medications. Does ensure. Chocolate milk helps. Endorses excessive burping.   H/o bradycardia per patient, always has been 40's per patient. Reviewed prior ekg such as 09/2017  Athlete   2016 EGD, colonocopy  15 years of cigar.  HISTORY:  Past Medical History:  Diagnosis Date  . Anemia 2008  . Arthritis   . Cervicalgia   . Chronic lymphocytic leukemia (CLL), B-cell ([place]) 06/15/2015   Dr. Tasia Catchings  . Hyperlipidemia   . Low back pain   . Peripheral vascular disease [facility])    Past Surgical History:  Procedure Laterality Date  . COLONOSCOPY    . COLONOSCOPY WITH PROPOFOL N/A 07/23/2015   Procedure: COLONOSCOPY WITH PROPOFOL;  Surgeon: [name], MD;  Location: [facility] ENDOSCOPY;  Service: Endoscopy;  Laterality: N/A;  . ESOPHAGOGASTRODUODENOSCOPY (EGD) WITH PROPOFOL N/A 08/20/2015   Procedure: ESOPHAGOGASTRODUODENOSCOPY (EGD) WITH PROPOFOL;  Surgeon: [name], MD;  Location: [facility] ENDOSCOPY;  Service: Endoscopy;  Laterality: N/A;  . LOWER EXTREMITY ANGIOGRAPHY Left 05/19/2018   Procedure: LOWER EXTREMITY ANGIOGRAPHY;  Surgeon: Algernon Huxley, MD;  Location: [place] CV LAB;  Service: Cardiovascular;  Laterality: Left;  . lymp node removal Right  2011   arm   left arm 2015  . LYMPH NODE BIOPSY  08/2014   Family History  Problem Relation Age of Onset  . Kidney cancer Neg Hx        lung cancer  . Bladder Cancer Neg Hx   . Prostate cancer Neg Hx     Allergies: Latex Current Outpatient Medications on File Prior to Visit  Medication Sig Dispense Refill  . albuterol (PROVENTIL HFA;VENTOLIN HFA) 108 (90 Base) MCG/ACT inhaler Inhale 1-2 puffs into the lungs every 6 (six) hours as needed for wheezing or shortness of breath. 1 Inhaler 11  . aspirin EC 81 MG tablet Take 1 tablet (81 mg total) by mouth daily. 150 tablet 2  . clopidogrel (PLAVIX) 75 MG tablet Take 1 tablet (75 mg total) by mouth daily. 30 tablet 11  . ibuprofen (ADVIL,MOTRIN) 200 MG tablet Take 200 mg by mouth every 6 (six) hours as needed.    [name] MYRBETRIQ 25 MG TB24 tablet Take 1 tablet (25 mg total) by mouth daily. 90 tablet 0  . pravastatin (PRAVACHOL) 40 MG tablet Take 1 tablet (40 mg total) by mouth daily. 90 tablet 3   No current facility-administered medications on file prior to visit.     Social History   Tobacco Use  . Smoking status: Light Tobacco Smoker    Types: Cigars  . Smokeless tobacco: Never Used  . Tobacco comment: quit cigarettes 30 years ago used to smoke  1 pk last 1 week no FH lung cancer   Substance Use Topics  . Alcohol use: No  . Drug use: No    Review of Systems  Constitutional: Positive for appetite change, chills and unexpected weight change. Negative for fever.  HENT: Positive for congestion. Negative for ear pain, sinus pressure, sinus pain and sore throat.   Respiratory: Negative for cough, shortness of breath and wheezing.   Cardiovascular: Negative for chest pain, palpitations and leg swelling (resolved).  Gastrointestinal: Negative for nausea and vomiting.  Musculoskeletal: Positive for arthralgias.      Objective:    BP 130/74 (BP Location: Left Arm, Patient Position: Sitting, Cuff Size: Normal)   Pulse (!) 45   Temp 98.6  F (37 C) (Oral)   Wt 131 lb 6 oz (59.6 kg)   SpO2 100%   BMI 19.98 kg/m  Wt Readings from Last 3 Encounters:  05/30/18 131 lb 6 oz (59.6 kg)  05/19/18 131 lb (59.4 kg)  05/05/18 131 lb 9.6 oz (59.7 kg)    09/2017 had been 139.   Physical Exam  Constitutional: Vital signs are normal. He appears well-developed and well-nourished.  HENT:  Head: Normocephalic and atraumatic.  Right Ear: Hearing, tympanic membrane, external ear and ear canal normal. No drainage, swelling or tenderness. Tympanic membrane is not injected, not erythematous and not bulging. No middle ear effusion. No decreased hearing is noted.  Left Ear: Hearing, tympanic membrane, external ear and ear canal normal. No drainage, swelling or tenderness. Tympanic membrane is not injected, not erythematous and not bulging.  No middle ear effusion. No decreased hearing is noted.  Nose: Nose normal. Right sinus exhibits no maxillary sinus tenderness and no frontal sinus tenderness. Left sinus exhibits no maxillary sinus tenderness and no frontal sinus tenderness.  Mouth/Throat: Uvula is midline, oropharynx is clear and moist and mucous membranes are normal. No oropharyngeal exudate, posterior oropharyngeal edema, posterior oropharyngeal erythema or tonsillar abscesses.  Eyes: Conjunctivae are normal.  Cardiovascular: Regular rhythm and normal heart sounds. Bradycardia present.  No LE edema, palpable cords or masses. No erythema or increased warmth. No asymmetry in calf size when compared bilaterally LE hair growth symmetric and present. No discoloration of varicosities noted. LE warm and palpable pedal pulses.   Pulmonary/Chest: Effort normal and breath sounds normal. No respiratory distress. He has no wheezes. He has no rhonchi. He has no rales.  Lymphadenopathy:       Head (right side): No submental, no submandibular, no tonsillar, no preauricular, no posterior auricular and no occipital adenopathy present.       Head (left  side): No submental, no submandibular, no tonsillar, no preauricular, no posterior auricular and no occipital adenopathy present.    He has no cervical adenopathy.  Neurological: He is alert.  Skin: Skin is warm and dry.  Psychiatric: He has a normal mood and affect. His speech is normal and behavior is normal.  Vitals reviewed.      Assessment & Plan:   Problem List Items Addressed This Visit      Digestive   Gastroesophageal reflux disease   Relevant Medications   ranitidine (ZANTAC) 150 MG tablet     Other   Fatigue (Chronic)    Patient is well-appearing today.  No acute respiratory distress.  Flu, strep negative.  Pending chest x-ray, labs at time of note.  With patient's history of CLL, I advised him if he does not feel well in the next couple days, to go ahead and  postpone his vascular surgery of his right leg which is in 4 days.  Etiology of his complaints are nonspecific at this time, although I am reassured by exam.  Pending further work-up at this time.      Weight loss    Approximately 8 pound weight loss over the past 7 to 8 months.  He does describe that his leg pain did affect his appetite.  Hoping this will improve.  Also think acid reflux was deterring him from eating.  Advised to go ahead and start Zantac over-the-counter.  Follow-up in 2 to 3 months      Relevant Orders   CBC with Differential/Platelet   Comprehensive metabolic panel   TSH   VITAMIN D 25 Hydroxy (Vit-D Deficiency, Fractures)   Bradycardia    Chronic, unchanged.  Patient never saw cardiology last year as we planned.  Referral is been placed again today.  Advised patient to call me this week if he does not hear from [name] this week.  Will follow      Relevant Orders   Ambulatory referral to Cardiology    Other Visit Diagnoses    Fever, unspecified fever cause    -  Primary   Relevant Orders   POC Influenza A&B (Binax test) (Completed)   DG Chest 2 View   POCT rapid strep A        I have  discontinued Jeanluc Caples's gabapentin, omeprazole, and predniSONE. I am also having him start on ranitidine. Additionally, I am having him maintain his MYRBETRIQ, pravastatin, albuterol, aspirin EC, clopidogrel, and ibuprofen.   Meds ordered this encounter  Medications  . ranitidine (ZANTAC) 150 MG tablet    Sig: Take 1 tablet (150 mg total) by mouth 2 (two) times daily for 7 days.    Dispense:  120 tablet    Refill:  2    Order Specific Question:   Supervising Provider    Answer:   Crecencio Mc [[id number]]    Return precautions given.   Risks, benefits, and alternatives of the medications and treatment plan prescribed today were discussed, and patient expressed understanding.   Education regarding symptom management and diagnosis given to patient on AVS.  Continue to follow with [name], FNP for routine health maintenance.   Earney Hamburg and I agreed with plan.   [name], FNP

## 2018-05-30 NOTE — Assessment & Plan Note (Signed)
Chronic, unchanged.  Patient never saw cardiology last year as we planned.  Referral is been placed again today.  Advised patient to call me this week if he does not hear from [name] this week.  Will follow

## 2018-05-30 NOTE — Telephone Encounter (Signed)
Pt called with several symptoms. He thinks his blood is dropping and will possibly need a blood transfusion. He feels weak along with nausea and having chills.  He is requesting to have some blood work done to check his hgb.  He has not checked his temperature and he does not know if he has a fever. He had surgery on his leg last week and states that it is very painful. Keeps him from sleeping a night. Also requesting pain med for that. Advised that he may have to check with the provider that did his surgery for that but will pass this on to his pcp. Appointment already scheduled for today with his pcp. Advised that if he starts feeling worst or cant walk, he needs to call 911. Pt voiced understanding. Will route to flow at [facility] at [facility].  Reason for Disposition . [1] MILD weakness (i.e., does not interfere with ability to work, go to school, normal activities) AND [2] persists > 1 week  Answer Assessment - Initial Assessment Questions 1. DESCRIPTION: "Describe how you are feeling."     weak 2. SEVERITY: "How bad is it?"  "Can you stand and walk?"   - MILD - Feels weak or tired, but does not interfere with work, school or normal activities   - [place] to stand and walk; weakness interferes with work, school, or normal activities   - SEVERE - Unable to stand or walk     mild 3. ONSET:  "When did the weakness begin?"     yesterday 4. CAUSE: "What do you think is causing the weakness?"     Thinks blood count is low 5. MEDICINES: "Have you recently started a new medicine or had a change in the amount of a medicine?"     no 6. OTHER SYMPTOMS: "Do you have any other symptoms?" (e.g., chest pain, fever, cough, SOB, vomiting, diarrhea, bleeding)     Shortness of breath, chills, nausea 7. PREGNANCY: "Is there any chance you are pregnant?" "When was your last menstrual period?"     n/a  Protocols used: WEAKNESS (GENERALIZED) AND FATIGUE-A-AH

## 2018-05-30 NOTE — Telephone Encounter (Signed)
Holly from [place] [facility] called with pt critical WBC of 19.2. Read back and verified.

## 2018-06-01 ENCOUNTER — Telehealth: Payer: Self-pay | Admitting: Family

## 2018-06-01 ENCOUNTER — Telehealth: Payer: Self-pay

## 2018-06-01 ENCOUNTER — Encounter (INDEPENDENT_AMBULATORY_CARE_PROVIDER_SITE_OTHER): Payer: Self-pay

## 2018-06-01 ENCOUNTER — Telehealth (INDEPENDENT_AMBULATORY_CARE_PROVIDER_SITE_OTHER): Payer: Self-pay

## 2018-06-01 DIAGNOSIS — R7989 Other specified abnormal findings of blood chemistry: Secondary | ICD-10-CM

## 2018-06-01 NOTE — Telephone Encounter (Signed)
Spoke with patient and advised in regards to labwork from Monday 05/30/18. Surgery has been postponed until 06/16/18. Advised that patient is continuing to have left leg pain and April stated that this was normal post operative.

## 2018-06-01 NOTE — Telephone Encounter (Signed)
Left voicemail for Dr Bunnie Domino office regarding below

## 2018-06-01 NOTE — Telephone Encounter (Signed)
Call pt  His blood work came back showing elevated neutrophils and platelets  Concern for infection though we have been unable to find source. Platelets in particular are high which can be high in viral etiologies.   HOW is he feeling today?   I would like to repeat his CBC to capture trend. Schedule lab for tomorrow.  I would advise he POST PONE surgery tomorrow.   Your vitamin D level is low.  You may add over the counter vitamin D 1000 units by mouth daily for 12 weeks. Then you may call our office for a follow up visit and we can recheck level.   TSH normal in labs.

## 2018-06-01 NOTE — Telephone Encounter (Signed)
noted 

## 2018-06-01 NOTE — Telephone Encounter (Signed)
Christan from [place] [facility] [phone]) called about some lab work done on the patient.I did return her call about the mutual patient but was not able to reach her.

## 2018-06-01 NOTE — Telephone Encounter (Signed)
Tried calling patient voicemail not setup

## 2018-06-01 NOTE — Telephone Encounter (Signed)
error 

## 2018-06-01 NOTE — Telephone Encounter (Signed)
Copied from [place] [id number]. Topic: Inquiry >> Jun 01, 2018  1:28 PM Pricilla Handler wrote: Reason for CRM: April at [facility] [phone]) called requesting to speak with Geanie Cooley concerning this patient. Please call back April at [phone].         Thank You!!!

## 2018-06-01 NOTE — Telephone Encounter (Signed)
Spoke to patient he has cancelled surgery for tomorrow re-scheduled for 06/15/18 .  Still having cold chills . Went to work yesterday, however he ws   having pain in left leg where surgery was done.  Having to take pain medication .   He has appointment with cancer MD tomorrow morning will let them know about lab work.  Lab appointment scheduled for tomorrow afternoon here.

## 2018-06-01 NOTE — Telephone Encounter (Signed)
I spoke with Christan and she wanted inform JD the patient platelets and neutrophils were elevated but they will be repeating his blood work and I did inform her that his procedure will be 06/16/2018.

## 2018-06-02 ENCOUNTER — Telehealth: Payer: Self-pay | Admitting: Radiology

## 2018-06-02 ENCOUNTER — Ambulatory Visit: Payer: BLUE CROSS/BLUE SHIELD | Admitting: Urology

## 2018-06-02 ENCOUNTER — Other Ambulatory Visit (INDEPENDENT_AMBULATORY_CARE_PROVIDER_SITE_OTHER): Payer: BLUE CROSS/BLUE SHIELD

## 2018-06-02 ENCOUNTER — Encounter: Payer: Self-pay | Admitting: Urology

## 2018-06-02 DIAGNOSIS — R7989 Other specified abnormal findings of blood chemistry: Secondary | ICD-10-CM

## 2018-06-02 LAB — CBC WITH DIFFERENTIAL/PLATELET
Basophils Absolute: 0.1 10*3/uL (ref 0.0–0.1)
Basophils Relative: 0.4 % (ref 0.0–3.0)
EOS ABS: 0.3 10*3/uL (ref 0.0–0.7)
Eosinophils Relative: 1.5 % (ref 0.0–5.0)
HEMATOCRIT: 34.3 % — AB (ref 39.0–52.0)
Hemoglobin: 10.7 g/dL — ABNORMAL LOW (ref 13.0–17.0)
LYMPHS ABS: 17.2 10*3/uL — AB (ref 0.7–4.0)
Lymphocytes Relative: 73.3 % — ABNORMAL HIGH (ref 12.0–46.0)
MCHC: 31.3 g/dL (ref 30.0–36.0)
MCV: 76.6 fl — ABNORMAL LOW (ref 78.0–100.0)
MONO ABS: 0.7 10*3/uL (ref 0.1–1.0)
Monocytes Relative: 3 % (ref 3.0–12.0)
NEUTROS ABS: 5.1 10*3/uL (ref 1.4–7.7)
PLATELETS: 509 10*3/uL — AB (ref 150.0–400.0)
RBC: 4.47 Mil/uL (ref 4.22–5.81)
RDW: 15.7 % — AB (ref 11.5–15.5)
WBC: 23.4 10*3/uL (ref 4.0–10.5)

## 2018-06-02 NOTE — Telephone Encounter (Signed)
Tanya from [place] lab called with critical WBC of 23.4. Read back and verified.

## 2018-06-03 ENCOUNTER — Telehealth: Payer: Self-pay | Admitting: Family

## 2018-06-03 ENCOUNTER — Encounter: Payer: Self-pay | Admitting: Urology

## 2018-06-03 ENCOUNTER — Inpatient Hospital Stay: Payer: BLUE CROSS/BLUE SHIELD | Attending: Oncology

## 2018-06-03 ENCOUNTER — Encounter: Payer: Self-pay | Admitting: Oncology

## 2018-06-03 ENCOUNTER — Other Ambulatory Visit: Payer: Self-pay

## 2018-06-03 ENCOUNTER — Telehealth: Payer: Self-pay

## 2018-06-03 ENCOUNTER — Inpatient Hospital Stay (HOSPITAL_BASED_OUTPATIENT_CLINIC_OR_DEPARTMENT_OTHER): Payer: BLUE CROSS/BLUE SHIELD | Admitting: Oncology

## 2018-06-03 VITALS — BP 119/66 | HR 54 | Temp 95.1°F | Resp 18 | Wt 131.6 lb

## 2018-06-03 DIAGNOSIS — D561 Beta thalassemia: Secondary | ICD-10-CM | POA: Insufficient documentation

## 2018-06-03 DIAGNOSIS — I739 Peripheral vascular disease, unspecified: Secondary | ICD-10-CM

## 2018-06-03 DIAGNOSIS — Z7982 Long term (current) use of aspirin: Secondary | ICD-10-CM

## 2018-06-03 DIAGNOSIS — R197 Diarrhea, unspecified: Secondary | ICD-10-CM | POA: Diagnosis not present

## 2018-06-03 DIAGNOSIS — C9190 Lymphoid leukemia, unspecified not having achieved remission: Secondary | ICD-10-CM | POA: Diagnosis not present

## 2018-06-03 DIAGNOSIS — C9110 Chronic lymphocytic leukemia of B-cell type not having achieved remission: Secondary | ICD-10-CM

## 2018-06-03 DIAGNOSIS — R5383 Other fatigue: Secondary | ICD-10-CM | POA: Diagnosis not present

## 2018-06-03 DIAGNOSIS — Z79899 Other long term (current) drug therapy: Secondary | ICD-10-CM

## 2018-06-03 DIAGNOSIS — R634 Abnormal weight loss: Secondary | ICD-10-CM | POA: Insufficient documentation

## 2018-06-03 DIAGNOSIS — E785 Hyperlipidemia, unspecified: Secondary | ICD-10-CM

## 2018-06-03 DIAGNOSIS — Z9119 Patient's noncompliance with other medical treatment and regimen: Secondary | ICD-10-CM | POA: Diagnosis not present

## 2018-06-03 DIAGNOSIS — R630 Anorexia: Secondary | ICD-10-CM | POA: Diagnosis not present

## 2018-06-03 DIAGNOSIS — F17290 Nicotine dependence, other tobacco product, uncomplicated: Secondary | ICD-10-CM

## 2018-06-03 DIAGNOSIS — K8689 Other specified diseases of pancreas: Secondary | ICD-10-CM

## 2018-06-03 LAB — CBC WITH DIFFERENTIAL/PLATELET
BASOS PCT: 0 %
Basophils Absolute: 0.1 10*3/uL (ref 0–0.1)
Eosinophils Absolute: 0.4 10*3/uL (ref 0–0.7)
Eosinophils Relative: 2 %
HEMATOCRIT: 32.2 % — AB (ref 40.0–52.0)
Hemoglobin: 10.4 g/dL — ABNORMAL LOW (ref 13.0–18.0)
LYMPHS PCT: 72 %
Lymphs Abs: 15.6 10*3/uL — ABNORMAL HIGH (ref 1.0–3.6)
MCH: 24.4 pg — ABNORMAL LOW (ref 26.0–34.0)
MCHC: 32.4 g/dL (ref 32.0–36.0)
MCV: 75.3 fL — AB (ref 80.0–100.0)
Monocytes Absolute: 0.6 10*3/uL (ref 0.2–1.0)
Monocytes Relative: 3 %
NEUTROS ABS: 4.8 10*3/uL (ref 1.4–6.5)
Neutrophils Relative %: 23 %
PLATELETS: 520 10*3/uL — AB (ref 150–440)
RBC: 4.27 MIL/uL — AB (ref 4.40–5.90)
RDW: 15.6 % — AB (ref 11.5–14.5)
WBC: 21.4 10*3/uL — AB (ref 3.8–10.6)

## 2018-06-03 LAB — COMPREHENSIVE METABOLIC PANEL
ALK PHOS: 52 U/L (ref 38–126)
ALT: 15 U/L — ABNORMAL LOW (ref 17–63)
ANION GAP: 9 (ref 5–15)
AST: 19 U/L (ref 15–41)
Albumin: 3.7 g/dL (ref 3.5–5.0)
BILIRUBIN TOTAL: 0.5 mg/dL (ref 0.3–1.2)
BUN: 14 mg/dL (ref 6–20)
CALCIUM: 8.8 mg/dL — AB (ref 8.9–10.3)
CO2: 22 mmol/L (ref 22–32)
Chloride: 105 mmol/L (ref 101–111)
Creatinine, Ser: 1.14 mg/dL (ref 0.61–1.24)
Glucose, Bld: 98 mg/dL (ref 65–99)
POTASSIUM: 4.1 mmol/L (ref 3.5–5.1)
Sodium: 136 mmol/L (ref 135–145)
Total Protein: 7.2 g/dL (ref 6.5–8.1)

## 2018-06-03 MED ORDER — PANCRELIPASE (LIP-PROT-AMYL) 36000-114000 UNITS PO CPEP: 36000.0000 [IU] | ORAL_CAPSULE | Freq: Three times a day (TID) | ORAL | 3 refills | Status: DC

## 2018-06-03 NOTE — Telephone Encounter (Signed)
Noted  Please advise that he make a f/u with [name] in a month, sooner if symptoms worsen

## 2018-06-03 NOTE — Telephone Encounter (Signed)
Copied from [place] [id number]. Topic: Quick Communication - See Telephone Encounter >> Jun 03, 2018 11:11 AM Antonieta Iba C wrote: CRM for notification. See Telephone encounter for: 06/03/18.  Pt is calling in to speak back with Cyril Mourning, please call back and assist further per a previous conversation in chart.

## 2018-06-03 NOTE — Telephone Encounter (Signed)
Tried calling patient back no voicemail set up

## 2018-06-03 NOTE — Progress Notes (Signed)
Patient here today for follow up. Received call this morning from nurse at PCP concerning his WBC count and they advised him to seek urgent care.

## 2018-06-03 NOTE — Telephone Encounter (Signed)
Spoke with patient , he states Dr Miachel Roux reviewed labs and they are stable. She is going to order CT scan prior to his surgery if they can get it scheduled or after .  She doesn't feel patient needs further workup at this time for current symptoms

## 2018-06-03 NOTE — Progress Notes (Signed)
[facility] day:  06/03/18    Chief Complaint: [name] is a 67 y.o. 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  HPI:  The patient was last seen in the medical onocology clinic on 12/25/2016. Patient reports feeling profound fatigue, lack of appetite, weight loss. He weight 128 pounds today, comparing to 137 pounds when he was here in January 2018.  He met with [name], registered dietitian on 01/04/2017. Patient used to follow up with Dr.[name].    Treatment history: He originally presented with hemolytic anemia in 01/2014.  He was treated with prednisone. With taper of prednisone, his hemolysis returned.I think it's only positives are Per Dr.[name]'s note, patient received 4 weekly cycles of Rituxan (10/02/2014- 10/23/2014).   07/23/2017 Decision was made to start Ibrutinib as patient is very symptomatic (fatigue and weight loss, lack of appetite). Patient did not show up at the chemo education class. 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   INTERVAL HISTORY Patient presents for follow-up of CLL.  he has multiple complaints. #Fatigue: reports worsening fatigue. Chronic onset, perisistent, no aggravating or improving factors, no associated symptoms.  # Weight loss: This is a chronic problem for him.  His weight fluctuate a lot.  Today his weight is 131 pound.  Decreased from April 2019, similar to the level when he establish care with me last year in August. #Severe peripheral vascular disease status post left lower extremity vascular surgery, with improved off left lower extremity pain.  He is scheduled to have procedure done on the right lower extremity June 15, 2018. # Diarrhea: Also has been a chronic problem for him he usually has 3-4 bowel.  Previously had  extensive GI work-up. Was considered to have pancreatic insufficiency.  He has tried pancreatic enzyme in the past with improvement.  He admits not being compliant lately #Recent cough and chills for several weeks.  He had a chest x-ray done which was independently reviewed by me.  Showed no active cardiopulmonary disease.  Cough and chills have improved.   Past Medical History:  Diagnosis Date  . Anemia 2008  . Arthritis   . Cervicalgia   . Chronic lymphocytic leukemia (CLL), B-cell ([place]) 06/15/2015   Dr. Tasia Catchings  . Hyperlipidemia   . Low back pain   . Peripheral vascular disease [facility])     Past Surgical History:  Procedure Laterality Date  . COLONOSCOPY    . COLONOSCOPY WITH PROPOFOL N/A 07/23/2015   Procedure: COLONOSCOPY WITH PROPOFOL;  Surgeon: [name], MD;  Location[facility] ENDOSCOPY;  Service: Endoscopy;  Laterality: N/A;  . ESOPHAGOGASTRODUODENOSCOPY (EGD) WITH PROPOFOL N/A 08/20/2015   Procedure: ESOPHAGOGASTRODUODENOSCOPY (EGD) WITH PROPOFOL;  Surgeon: [name], MD;  Location: [facility] ENDOSCOPY;  Service: Endoscopy;  Laterality: N/A;  . LOWER EXTREMITY ANGIOGRAPHY Left 05/19/2018   Procedure: LOWER EXTREMITY ANGIOGRAPHY;  Surgeon: Algernon Huxley, MD;  Location: [facility] LAB;  Service: Cardiovascular;  Laterality: Left;  . lymp node removal Right 2011   arm   left arm 2015  . LYMPH NODE BIOPSY  08/2014    Family History  Problem Relation Age of Onset  . Kidney cancer Neg Hx        lung cancer  . Bladder Cancer Neg Hx   . Prostate cancer Neg Hx  Social History:  reports that he has been smoking cigars.  He has never used smokeless tobacco. He reports that he does not drink alcohol or use drugs.  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  Allergies:  Allergies  Allergen Reactions  . Latex Rash   Review of Systems:  Review of Systems  Constitutional: Negative for chills, fever,  malaise/fatigue and weight loss.  HENT: Negative for ear discharge, ear pain and nosebleeds.   Eyes: Negative for photophobia and pain.  Respiratory: Negative for cough, hemoptysis and sputum production.        Flu symptoms.   Cardiovascular: Negative for chest pain and palpitations.  Gastrointestinal: Negative for constipation, diarrhea, heartburn, nausea and vomiting.  Genitourinary: Negative for dysuria and frequency.  Musculoskeletal: Negative for neck pain.  Neurological: Negative for dizziness, tremors, weakness and headaches.  Endo/Heme/Allergies: Does not bruise/bleed easily.  Psychiatric/Behavioral: Negative for depression and suicidal ideas.   Current Outpatient Medications on File Prior to Visit  Medication Sig Dispense Refill  . albuterol (PROVENTIL HFA;VENTOLIN HFA) 108 (90 Base) MCG/ACT inhaler Inhale 1-2 puffs into the lungs every 6 (six) hours as needed for wheezing or shortness of breath. 1 Inhaler 11  . aspirin EC 81 MG tablet Take 1 tablet (81 mg total) by mouth daily. 150 tablet 2  . clopidogrel (PLAVIX) 75 MG tablet Take 1 tablet (75 mg total) by mouth daily. 30 tablet 11  . ibuprofen (ADVIL,MOTRIN) 200 MG tablet Take 200 mg by mouth every 6 (six) hours as needed.    [name] MYRBETRIQ 25 MG TB24 tablet Take 1 tablet (25 mg total) by mouth daily. 90 tablet 0  . pravastatin (PRAVACHOL) 40 MG tablet Take 1 tablet (40 mg total) by mouth daily. 90 tablet 3  . ranitidine (ZANTAC) 150 MG tablet Take 1 tablet (150 mg total) by mouth 2 (two) times daily for 7 days. 120 tablet 2  . traMADol (ULTRAM) 50 MG tablet Take 50 mg by mouth every 6 (six) hours as needed. for pain  1   No current facility-administered medications on file prior to visit.     Physical Exam: Blood pressure 119/66, pulse (!) 54, temperature (!) 95.1 F (35.1 C), temperature source Tympanic, resp. rate 18, weight 131 lb 9.6 oz (59.7 kg), SpO2 99 %.  LABORATORY RESULTS. CBC    Component Value Date/Time   WBC  21.4 (H) 06/03/2018 1027   RBC 4.27 (L) 06/03/2018 1027   HGB 10.4 (L) 06/03/2018 1027   HGB 13.0 03/13/2015 0852   HCT 32.2 (L) 06/03/2018 1027   HCT 40.9 03/13/2015 0852   PLT 520 (H) 06/03/2018 1027   PLT 217 03/13/2015 0852   MCV 75.3 (L) 06/03/2018 1027   MCV 78 (L) 03/13/2015 0852   MCH 24.4 (L) 06/03/2018 1027   MCHC 32.4 06/03/2018 1027   RDW 15.6 (H) 06/03/2018 1027   RDW 16.0 (H) 03/13/2015 0852   LYMPHSABS 15.6 (H) 06/03/2018 1027   LYMPHSABS 3.9 (H) 03/13/2015 0852   MONOABS 0.6 06/03/2018 1027   MONOABS 0.4 03/13/2015 0852   EOSABS 0.4 06/03/2018 1027   EOSABS 0.4 03/13/2015 0852   BASOSABS 0.1 06/03/2018 1027   BASOSABS 0.1 03/13/2015 0852   CMP     Component Value Date/Time   NA 138 05/30/2018 1357   NA 136 03/13/2015 0852   K 4.5 05/30/2018 1357   K 3.9 03/13/2015 5427  CL 103 05/30/2018 1357   CL 104 03/13/2015 0852   CO2 29 05/30/2018 1357   CO2 26 03/13/2015 0852   GLUCOSE 91 05/30/2018 1357   GLUCOSE 96 03/13/2015 0852   BUN 12 05/30/2018 1357   BUN 11 03/13/2015 0852   CREATININE 1.04 05/30/2018 1357   CREATININE 1.09 03/13/2015 0852   CALCIUM 9.5 05/30/2018 1357   CALCIUM 9.3 03/13/2015 0852   PROT 7.3 05/30/2018 1357   PROT 8.1 03/13/2015 0852   ALBUMIN 3.8 05/30/2018 1357   ALBUMIN 4.5 03/13/2015 0852   AST 22 05/30/2018 1357   AST 26 03/13/2015 0852   ALT 16 05/30/2018 1357   ALT 12 (L) 03/13/2015 0852   ALKPHOS 54 05/30/2018 1357   ALKPHOS 57 03/13/2015 0852   BILITOT 0.4 05/30/2018 1357   BILITOT 1.2 03/13/2015 0852   GFRNONAA >60 03/04/2018 0840   GFRNONAA >60 03/13/2015 0852   GFRAA >60 03/04/2018 0840   GFRAA >60 03/13/2015 0852   PATHOLOGY:  11/02/2016 Peripheral blood FISH studies revealed 20% of nuclei positive for loss of 1 ATM signal, 63% of nuclei positive for trisomy 12, and 32% of nuclei positive for loss of 1 TP53 signal. Results for CCND1/IGH and 13q were normal.  09/13/2014 Left axillary node biopsy on  confirmed  B-cell small lymphocytic lymphoma (B-CLL/SLL)   IMAGE STUDIES  02/09/2014 Chest and abdomen CT scan  revealed bulky bilateral axillary adenopathy and bulky intraperitoneal and retroperitoneal adenopathy. 07/11/2014 PET scan  revealed mildly hypermetabolic lymph nodes in the chest, abdomen, and pelvis and mildly hypermetabolic spleen.  06/24/2016 PET scan  revealed bilateral moderate hypermetabolic axillary lymphadenopathy increased in size (1.6 cm compared to 1.3 cm) and metabolism (SUV 3.9 compared to 3.6).  There were bilateral moderate hypermetabolic external iliac lymphadenopathy stable to decreased in metabolism.  There was mesenteric and retroperitoneal lymphadenopathy stable in size and non hypermetabolic.  There were no new sites of hypermetabolic lymphoma.  There were subcentimeter pulmonary nodules stable and below PET resolution. 07/21/2017 CT chest abdomen pelvis w contrast:  Mild interval increase in bulky mesenteric lymphadenopathy since prior exam. Stable lymphadenopathy in bilateral axillary regions, abdominal retroperitoneum, and bilateral external iliac chains.  PREVIOUS ENDOSCOPY He has slowly progressive microcytic anemia.  Colonoscopy on 07/23/2015 revealed diverticulosis in the sigmoid colon.  EGD on 08/20/2015 was normal. Guaiac cards x 3 were negative in 06/2015.  His diet fluctuates with his appetite.  He denies any melana or hematochezia.   Assessment:  [name] is a 67 y.o. male with B-cell small lymphocytic lymphoma (B-cell CLL/SLL) initially presented with hemolytic anemia in 01/2014, s/p treatment with steroid and Rituximab, presents for follow-up.  1. CLL (chronic lymphocytic leukemia) ([place])   2. Weight loss   3. Diarrhea, unspecified type   4. Other fatigue   5. Pancreatic insufficiency     Plan: #CLL, labs reviewed and discussed with patient.   Patient's white count has been stable however he appears to have more fatigue ongoing weight loss.  However due to  his malabsorption/diarrhea, his symptoms can be complicated due to his GI issues. WBC has been within his baseline range.  Discussed with patient that I will order a PET scan for reevaluation of his chronic lymphadenopathy.  If no significant progression, I suggest continue active surveillance.  If he does have significant progression, will discuss about CLL treatment.  #Fatigue, has been on chronic issue however appears to be worsened.  May or may not be related to CLL #Weight loss, worsen  lately.  It is difficult to tell whether this is from his malabsorption versus #Chronic pancreatic insufficiency, previously symptoms improved after taking cryo.  However patient is noncompliant and stopped taking it.  I resent the prescription to his pharmacy.  Advised patient to start taking it as instructed.  He voices understanding. #Clinically he has recovered from his flu.  I advised patient continue follow-up with primary care physician/ #Thrombocytosis, this is a new problem for him.  Likely reactive from recent vascular surgery.  Continue to monitor.  Follow up with me in 2 months. Orders Placed This Encounter  Procedures  . NM PET Image Restag (PS) Skull Base To Thigh    Standing Status:   Future    Standing Expiration Date:   06/03/2019    Order Specific Question:   If indicated for the ordered procedure, I authorize the administration of a radiopharmaceutical per Radiology protocol    Answer:   Yes    Order Specific Question:   Preferred imaging location?    Answer:   [facility]    Order Specific Question:   Radiology Contrast Protocol - do NOT remove file path    Answer:   [email or web address]  . CBC with Differential/Platelet    Standing Status:   Future    Standing Expiration Date:   06/04/2019  . Comprehensive metabolic panel    Standing Status:   Future    Standing Expiration Date:   06/04/2019  . Lactate dehydrogenase    Standing Status:   Future     Standing Expiration Date:   06/04/2019     Earlie Server, MD, PhD Hematology Oncology [facility] Pager- [phone] 06/03/2018

## 2018-06-03 NOTE — Telephone Encounter (Signed)
Tried 2x from my personal phone to let patient know about WBCs, platelets

## 2018-06-06 NOTE — Telephone Encounter (Signed)
Tried calling patient no voice mail set up

## 2018-06-09 NOTE — Telephone Encounter (Signed)
Called and spoke with patient he states he is feeling better. No fever , weakness , appetite has increased. Will be having surgery this Wednesday.

## 2018-06-10 NOTE — Telephone Encounter (Signed)
noted 

## 2018-06-13 ENCOUNTER — Inpatient Hospital Stay: Admission: RE | Admit: 2018-06-13 | Payer: BLUE CROSS/BLUE SHIELD | Source: Ambulatory Visit

## 2018-06-14 ENCOUNTER — Ambulatory Visit: Payer: BLUE CROSS/BLUE SHIELD

## 2018-06-15 ENCOUNTER — Encounter
Admission: RE | Disposition: A | Discharge status: Home / Self Care | Payer: Self-pay | Source: Ambulatory Visit | Attending: Vascular Surgery

## 2018-06-15 ENCOUNTER — Ambulatory Visit
Admission: RE | Admit: 2018-06-15 | Discharge: 2018-06-15 | Disposition: A | Discharge status: Home / Self Care | Payer: BLUE CROSS/BLUE SHIELD | Source: Ambulatory Visit | Attending: Vascular Surgery | Admitting: Vascular Surgery

## 2018-06-15 DIAGNOSIS — Z91040 Latex allergy status: Secondary | ICD-10-CM | POA: Insufficient documentation

## 2018-06-15 DIAGNOSIS — Z9889 Other specified postprocedural states: Secondary | ICD-10-CM | POA: Diagnosis not present

## 2018-06-15 DIAGNOSIS — E785 Hyperlipidemia, unspecified: Secondary | ICD-10-CM | POA: Diagnosis not present

## 2018-06-15 DIAGNOSIS — R7303 Prediabetes: Secondary | ICD-10-CM | POA: Insufficient documentation

## 2018-06-15 DIAGNOSIS — Z955 Presence of coronary angioplasty implant and graft: Secondary | ICD-10-CM | POA: Diagnosis not present

## 2018-06-15 DIAGNOSIS — I70219 Atherosclerosis of native arteries of extremities with intermittent claudication, unspecified extremity: Secondary | ICD-10-CM

## 2018-06-15 DIAGNOSIS — M1990 Unspecified osteoarthritis, unspecified site: Secondary | ICD-10-CM | POA: Diagnosis not present

## 2018-06-15 DIAGNOSIS — F17290 Nicotine dependence, other tobacco product, uncomplicated: Secondary | ICD-10-CM | POA: Diagnosis not present

## 2018-06-15 DIAGNOSIS — I70213 Atherosclerosis of native arteries of extremities with intermittent claudication, bilateral legs: Secondary | ICD-10-CM | POA: Insufficient documentation

## 2018-06-15 DIAGNOSIS — I70211 Atherosclerosis of native arteries of extremities with intermittent claudication, right leg: Secondary | ICD-10-CM | POA: Diagnosis not present

## 2018-06-15 HISTORY — PX: LOWER EXTREMITY ANGIOGRAPHY: CATH118251

## 2018-06-15 LAB — CREATININE, SERUM: Creatinine, Ser: 0.83 mg/dL (ref 0.61–1.24)

## 2018-06-15 LAB — BUN: BUN: 15 mg/dL (ref 8–23)

## 2018-06-15 SURGERY — LOWER EXTREMITY ANGIOGRAPHY
Anesthesia: Moderate Sedation | Laterality: Right

## 2018-06-15 MED ORDER — LABETALOL HCL 5 MG/ML IV SOLN: 10.0000 mg | INTRAVENOUS | Status: DC | PRN

## 2018-06-15 MED ORDER — CEFAZOLIN SODIUM-DEXTROSE 2-4 GM/100ML-% IV SOLN
2.0000 g | Freq: Once | INTRAVENOUS | Status: AC
  Administered 2018-06-15: 2 g via INTRAVENOUS

## 2018-06-15 MED ORDER — ACETAMINOPHEN 325 MG PO TABS: 650.0000 mg | ORAL_TABLET | ORAL | Status: DC | PRN

## 2018-06-15 MED ORDER — CEFAZOLIN SODIUM-DEXTROSE 2-4 GM/100ML-% IV SOLN
INTRAVENOUS | Status: AC
  Administered 2018-06-15: 2 g via INTRAVENOUS
  Filled 2018-06-15: qty 100

## 2018-06-15 MED ORDER — HYDRALAZINE HCL 20 MG/ML IJ SOLN: 5.0000 mg | INTRAMUSCULAR | Status: DC | PRN

## 2018-06-15 MED ORDER — ONDANSETRON HCL 4 MG/2ML IJ SOLN: 4.0000 mg | Freq: Four times a day (QID) | INTRAMUSCULAR | Status: DC | PRN

## 2018-06-15 MED ORDER — SODIUM CHLORIDE 0.9% FLUSH
3.0000 mL | INTRAVENOUS | Status: DC | PRN
Start: 2018-06-15 — End: 2018-06-15

## 2018-06-15 MED ORDER — LIDOCAINE-EPINEPHRINE (PF) 1 %-1:200000 IJ SOLN
INTRAMUSCULAR | Status: AC
  Filled 2018-06-15: qty 30

## 2018-06-15 MED ORDER — SODIUM CHLORIDE 0.9 % IV SOLN: INTRAVENOUS | Status: DC

## 2018-06-15 MED ORDER — FENTANYL CITRATE (PF) 100 MCG/2ML IJ SOLN
INTRAMUSCULAR | Status: AC
  Filled 2018-06-15: qty 2

## 2018-06-15 MED ORDER — IOPAMIDOL (ISOVUE-300) INJECTION 61%
INTRAVENOUS | Status: DC | PRN
  Administered 2018-06-15: 40 mL via INTRA_ARTERIAL

## 2018-06-15 MED ORDER — HEPARIN (PORCINE) IN NACL 1000-0.9 UT/500ML-% IV SOLN
INTRAVENOUS | Status: AC
  Filled 2018-06-15: qty 1000

## 2018-06-15 MED ORDER — MIDAZOLAM HCL 5 MG/5ML IJ SOLN
INTRAMUSCULAR | Status: AC
  Filled 2018-06-15: qty 5

## 2018-06-15 MED ORDER — HYDROMORPHONE HCL 1 MG/ML IJ SOLN: 1.0000 mg | Freq: Once | INTRAMUSCULAR | Status: DC | PRN

## 2018-06-15 MED ORDER — SODIUM CHLORIDE 0.9 % IV SOLN
INTRAVENOUS | Status: DC
  Administered 2018-06-15: 08:00:00 via INTRAVENOUS

## 2018-06-15 MED ORDER — METHYLPREDNISOLONE SODIUM SUCC 125 MG IJ SOLR: 125.0000 mg | INTRAMUSCULAR | Status: DC | PRN

## 2018-06-15 MED ORDER — MIDAZOLAM HCL 2 MG/2ML IJ SOLN
INTRAMUSCULAR | Status: DC | PRN
  Administered 2018-06-15: 2 mg via INTRAVENOUS
  Administered 2018-06-15 (×2): 0.5 mg via INTRAVENOUS
  Administered 2018-06-15 (×2): 1 mg via INTRAVENOUS

## 2018-06-15 MED ORDER — SODIUM CHLORIDE 0.9% FLUSH: 3.0000 mL | Freq: Two times a day (BID) | INTRAVENOUS | Status: DC

## 2018-06-15 MED ORDER — FAMOTIDINE 20 MG PO TABS: 40.0000 mg | ORAL_TABLET | ORAL | Status: DC | PRN

## 2018-06-15 MED ORDER — HEPARIN SODIUM (PORCINE) 1000 UNIT/ML IJ SOLN
INTRAMUSCULAR | Status: AC
  Filled 2018-06-15: qty 1

## 2018-06-15 MED ORDER — FENTANYL CITRATE (PF) 100 MCG/2ML IJ SOLN
INTRAMUSCULAR | Status: DC | PRN
  Administered 2018-06-15: 25 ug via INTRAVENOUS
  Administered 2018-06-15: 50 ug via INTRAVENOUS
  Administered 2018-06-15: 25 ug via INTRAVENOUS

## 2018-06-15 MED ORDER — HEPARIN SODIUM (PORCINE) 1000 UNIT/ML IJ SOLN
INTRAMUSCULAR | Status: DC | PRN
  Administered 2018-06-15: 5000 [IU] via INTRAVENOUS

## 2018-06-15 MED ORDER — SODIUM CHLORIDE 0.9 % IV SOLN: 250.0000 mL | INTRAVENOUS | Status: DC | PRN

## 2018-06-15 SURGICAL SUPPLY — 20 items
BALLN ARMADA 35LL 6X250X135 (BALLOONS) ×2
BALLN LUTONIX 5X220X130 (BALLOONS) ×2
BALLN LUTONIX 6X150X130 (BALLOONS) ×2
BALLOON ARMADA 35LL 6X250X135 (BALLOONS) ×1 IMPLANT
BALLOON LUTONIX 5X220X130 (BALLOONS) ×1 IMPLANT
BALLOON LUTONIX 6X150X130 (BALLOONS) ×1 IMPLANT
CATH BEACON 5 .035 65 RIM TIP (CATHETERS) ×2 IMPLANT
CATH BEACON 5 .038 100 VERT TP (CATHETERS) ×2 IMPLANT
COVER PROBE U/S 5X48 (MISCELLANEOUS) ×2 IMPLANT
DEVICE PRESTO INFLATION (MISCELLANEOUS) ×2 IMPLANT
DEVICE STARCLOSE SE CLOSURE (Vascular Products) ×2 IMPLANT
GLIDEWIRE ADV .035X260CM (WIRE) ×2 IMPLANT
PACK ANGIOGRAPHY (CUSTOM PROCEDURE TRAY) ×2 IMPLANT
SHEATH ANL2 6FRX45 HC (SHEATH) ×2 IMPLANT
SHEATH BRITE TIP 5FRX11 (SHEATH) ×2 IMPLANT
STENT VIABAHN 6X250X120 (Permanent Stent) ×2 IMPLANT
TOWEL OR 17X26 4PK STRL BLUE (TOWEL DISPOSABLE) ×2 IMPLANT
TUBING CONTRAST HIGH PRESS 72 (TUBING) ×2 IMPLANT
WIRE G V18X300CM (WIRE) ×2 IMPLANT
WIRE J 3MM .035X145CM (WIRE) ×2 IMPLANT

## 2018-06-15 NOTE — Progress Notes (Signed)
Dr. Lucky Cowboy at bedside to speak with pt. Re: procedure. Pt. Verbalized understanding.

## 2018-06-15 NOTE — H&P (Signed)
[facility] Admission History & Physical  MRN : [id number]  [name] is a 67 y.o. ([birth year]) male who presents with chief complaint of No chief complaint on file. [name]  History of Present Illness: Patient with known history of peripheral arterial disease.  Has already undergone left lower extremity revascularization.  Has had good improvement.  Here today for right lower extremity revascularization.  No rest pain or ulceration.  No fevers or chills.  Current Facility-Administered Medications  Medication Dose Route Frequency Provider Last Rate Last Dose  . 0.9 %  sodium chloride infusion   Intravenous Continuous Stegmayer, Kimberly A, PA-C 75 mL/hr at 06/15/18 0815    . ceFAZolin (ANCEF) 2-4 GM/100ML-% IVPB           . ceFAZolin (ANCEF) IVPB 2g/100 mL premix  2 g Intravenous Once Stegmayer, Kimberly A, PA-C      . famotidine (PEPCID) tablet 40 mg  40 mg Oral PRN Stegmayer, Janalyn Harder, PA-C      . HYDROmorphone (DILAUDID) injection 1 mg  1 mg Intravenous Once PRN Stegmayer, Kimberly A, PA-C      . methylPREDNISolone sodium succinate (SOLU-MEDROL) 125 mg/2 mL injection 125 mg  125 mg Intravenous PRN Stegmayer, Kimberly A, PA-C      . ondansetron (ZOFRAN) injection 4 mg  4 mg Intravenous Q6H PRN Stegmayer, Janalyn Harder, PA-C        Past Medical History:  Diagnosis Date  . Anemia 2008  . Arthritis   . Cervicalgia   . Chronic lymphocytic leukemia (CLL), B-cell ([place]) 06/15/2015   Dr. Tasia Catchings  . Hyperlipidemia   . Low back pain   . Peripheral vascular disease [facility])     Past Surgical History:  Procedure Laterality Date  . COLONOSCOPY    . COLONOSCOPY WITH PROPOFOL N/A 07/23/2015   Procedure: COLONOSCOPY WITH PROPOFOL;  Surgeon: [name], MD;  Location: [facility] ENDOSCOPY;  Service: Endoscopy;  Laterality: N/A;  . ESOPHAGOGASTRODUODENOSCOPY (EGD) WITH PROPOFOL N/A 08/20/2015   Procedure: ESOPHAGOGASTRODUODENOSCOPY (EGD) WITH PROPOFOL;  Surgeon: [name],  MD;  Location: [facility] ENDOSCOPY;  Service: Endoscopy;  Laterality: N/A;  . LOWER EXTREMITY ANGIOGRAPHY Left 05/19/2018   Procedure: LOWER EXTREMITY ANGIOGRAPHY;  Surgeon: Algernon Huxley, MD;  Location: [facility] LAB;  Service: Cardiovascular;  Laterality: Left;  . lymp node removal Right 2011   arm   left arm 2015  . LYMPH NODE BIOPSY  08/2014    Social History Social History   Tobacco Use  . Smoking status: Light Tobacco Smoker    Types: Cigars  . Smokeless tobacco: Never Used  . Tobacco comment: quit cigarettes 30 years ago used to smoke 1 pk last 1 week no FH lung cancer   Substance Use Topics  . Alcohol use: No  . Drug use: No    Family History Family History  Problem Relation Age of Onset  . Kidney cancer Neg Hx        lung cancer  . Bladder Cancer Neg Hx   . Prostate cancer Neg Hx   No bleeding or clotting disorders  Allergies  Allergen Reactions  . Latex Rash     REVIEW OF SYSTEMS (Negative unless checked)  Constitutional: [] Weight loss  [] Fever  [] Chills Cardiac: [] Chest pain   [] Chest pressure   [] Palpitations   [] Shortness of breath when laying flat   [] Shortness of breath at rest   [] Shortness of breath with exertion. Vascular:  [x] Pain in legs with walking   []   Pain in legs at rest   [] Pain in legs when laying flat   [x] Claudication   [] Pain in feet when walking  [] Pain in feet at rest  [] Pain in feet when laying flat   [] History of DVT   [] Phlebitis   [] Swelling in legs   [] Varicose veins   [] Non-healing ulcers Pulmonary:   [] Uses home oxygen   [] Productive cough   [] Hemoptysis   [] Wheeze  [] COPD   [] Asthma Neurologic:  [] Dizziness  [] Blackouts   [] Seizures   [] History of stroke   [] History of TIA  [] Aphasia   [] Temporary blindness   [] Dysphagia   [] Weakness or numbness in arms   [] Weakness or numbness in legs Musculoskeletal:  [x] Arthritis   [] Joint swelling   [] Joint pain   [] Low back pain Hematologic:  [] Easy bruising  [] Easy bleeding   [] Hypercoagulable  state   [] Anemic  [] Hepatitis Gastrointestinal:  [] Blood in stool   [] Vomiting blood  [] Gastroesophageal reflux/heartburn   [] Difficulty swallowing. Genitourinary:  [] Chronic kidney disease   [] Difficult urination  [] Frequent urination  [] Burning with urination   [] Blood in urine Skin:  [] Rashes   [] Ulcers   [] Wounds Psychological:  [] History of anxiety   []  History of major depression.  Physical Examination  Vitals:   06/15/18 0756  BP: 108/77  Pulse: (!) 50  Resp: 18  Temp: 97.8 F (36.6 C)  TempSrc: Oral  SpO2: 97%  Weight: 131 lb (59.4 kg)  Height: 5\' 8"  (1.727 m)   Body mass index is 19.92 kg/m. Gen: WD/WN, NAD Head: [place]/AT, No temporalis wasting.  Ear/Nose/Throat: Hearing grossly intact, nares w/o erythema or drainage, oropharynx w/o Erythema/Exudate,  Eyes: Conjunctiva clear, sclera non-icteric Neck: Trachea midline.  No JVD.  Pulmonary:  Good air movement, respirations not labored, no use of accessory muscles.  Cardiac: RRR, normal S1, S2. Vascular:  Vessel Right Left  Radial Palpable Palpable                          PT  not palpable  1+ palpable  DP  trace palpable Palpable   Musculoskeletal: M/S 5/5 throughout.  Extremities without ischemic changes.  No deformity or atrophy.  Neurologic: Sensation grossly intact in extremities.  Symmetrical.  Speech is fluent. Motor exam as listed above. Psychiatric: Judgment intact, Mood & affect appropriate for pt's clinical situation. Dermatologic: No rashes or ulcers noted.  No cellulitis or open wounds.      CBC Lab Results  Component Value Date   WBC 21.4 (H) 06/03/2018   HGB 10.4 (L) 06/03/2018   HCT 32.2 (L) 06/03/2018   MCV 75.3 (L) 06/03/2018   PLT 520 (H) 06/03/2018    BMET    Component Value Date/Time   NA 136 06/03/2018 1027   NA 136 03/13/2015 0852   K 4.1 06/03/2018 1027   K 3.9 03/13/2015 0852   CL 105 06/03/2018 1027   CL 104 03/13/2015 0852   CO2 22 06/03/2018 1027   CO2 26 03/13/2015  0852   GLUCOSE 98 06/03/2018 1027   GLUCOSE 96 03/13/2015 0852   BUN 15 06/15/2018 0804   BUN 11 03/13/2015 0852   CREATININE 0.83 06/15/2018 0804   CREATININE 1.09 03/13/2015 0852   CALCIUM 8.8 (L) 06/03/2018 1027   CALCIUM 9.3 03/13/2015 0852   GFRNONAA >60 06/15/2018 0804   GFRNONAA >60 03/13/2015 0852   GFRAA >60 06/15/2018 0804   GFRAA >60 03/13/2015 0852   Estimated Creatinine Clearance: 75.5 mL/min (by  C-G formula based on SCr of 0.83 mg/dL).  COAG No results found for: INR, PROTIME  Radiology Dg Chest 2 View  Result Date: 05/30/2018 CLINICAL DATA:  Cough and chills several weeks. EXAM: CHEST - 2 VIEW COMPARISON:  02/27/2018 FINDINGS: Lungs are adequately inflated without consolidation or effusion. Cardiomediastinal silhouette and remainder of the exam is unchanged. IMPRESSION: No active cardiopulmonary disease. Electronically Signed   By: Marin Olp M.D.   On: 05/30/2018 17:14    Assessment/Plan 1.  PAD with claudication bilateral lower extremities.  Left leg is Artie undergone successful revascularization.  Here today to treat the right leg.  Risks and benefits discussed. 2.  Hyperlipidemia. lipid control important in reducing the progression of atherosclerotic disease. Continue statin therapy 3.  Prediabetes.  Glucose control important to slow progression of atherosclerotic disease.   Leotis Pain, MD  06/15/2018 9:26 AM

## 2018-06-15 NOTE — Op Note (Signed)
[facility]  Percutaneous Study/Intervention Procedural Note   Date of Surgery: 06/15/2018  Surgeon(s):Jacorian Golaszewski    Assistants:none  Pre-operative Diagnosis: PAD with claudication bilateral lower extremities, status post left lower extremity revascularization  Post-operative diagnosis:  Same  Procedure(s) Performed:             1.  Ultrasound guidance for vascular access left femoral artery             2.  Catheter placement into right popliteal artery from left femoral approach             3.  Selective right lower extremity angiogram             4.  Percutaneous transluminal angioplasty of right SFA and proximal popliteal artery with 5 mm diameter by 22 cm length Lutonix drug-coated angioplasty balloon for the distal inflation and a 6 mm diameter by 15 cm length Lutonix drug-coated angioplasty balloon for the proximal inflation             5.   Viabahn stent placement to the right SFA for multiple areas of greater than 50% stenosis using a 6 mm diameter by 25 cm length stent  6.  StarClose closure device left femoral artery  EBL: 10 cc  Contrast: 40 cc  Fluoro Time: 6.8 minutes  Moderate Conscious Sedation Time: approximately 45 minutes using 5 mg of Versed and 100 mcg of Fentanyl              Indications:  Patient is a 67 y.o.male with lifestyle limiting claudication of both lower extremities.  He has already undergone successful revascularization of the left leg with good results. The patient has noninvasive study showing reduced perfusion bilaterally prior to intervention. The patient is brought in for angiography for further evaluation and potential treatment. Risks and benefits are discussed and informed consent is obtained.   Procedure:  The patient was identified and appropriate procedural time out was performed.  The patient was then placed supine on the table and prepped and draped in the usual sterile fashion. Moderate conscious sedation was  administered during a face to face encounter with the patient throughout the procedure with my supervision of the RN administering medicines and monitoring the patient's vital signs, pulse oximetry, telemetry and mental status throughout from the start of the procedure until the patient was taken to the recovery room. Ultrasound was used to evaluate the left common femoral artery.  It was patent .  A digital ultrasound image was acquired.  A Seldinger needle was used to access the left common femoral artery under direct ultrasound guidance and a permanent image was performed.  A 0.035 J wire was advanced without resistance and a 5Fr sheath was placed.   Aortogram was not performed as one was recently done and no aortoiliac intervention was required at that time. I then crossed the aortic bifurcation and advanced to the right femoral head. Selective right lower extremity angiogram was then performed. This demonstrated normal common femoral artery, profunda femoris artery, with occlusion of the SFA about 5 to 7 cm beyond its origin.  It then reconstituted the distal SFA although there was disease in the proximal popliteal artery of greater than 70% stenosis.  The vessel then normalized and there was a normal tibial trifurcation.  The anterior tibial artery had a proximal stenosis and occluded above the ankle and was not really continuous into the foot.  The peroneal artery had its typical course with no  obvious focal stenoses seen.  The posterior tibial artery was patent and was the dominant runoff into the foot without focal stenosis. The patient was systemically heparinized and a 6 [place] Ansell sheath was then placed over the [employer] wire. I then used a Kumpe catheter and the advantage wire to navigate through the SFA occlusion and across the popliteal stenosis and confirm intraluminal flow in the popliteal artery at the knee.  This also opacify the tibial vessels better than we did on the original  imaging.  I then replaced the wire and proceeded with treatment.  A 5 mm diameter by 22 cm length Lutonix drug-coated angioplasty balloon was inflated from the above-knee popliteal artery up to the mid SFA.  This was taken to 12 atm for 1 minute.  The proximal to mid SFA were then treated with a 6 mm diameter by 15 cm length Lutonix drug-coated angioplasty balloon inflated to 8 atm for 1 minute.  Completion angiogram following these angioplasty showed an 80% residual stenosis with a dissection at the original entry site of the occlusion about 5 to 7 cm into the SFA.  There was then some mild disease until the distal SFA where there was a 90% residual stenosis.  But the popliteal artery was relatively normal and the distal runoff appeared to be maintained.  I elected to cover these areas with a stent.  Exchanged for a 0.018 wire and a 6 mm diameter by 25 cm length Viabahn stent was deployed encompassing the lesions.  This was postdilated with a 6 mm balloon.  Care was taken to make sure we were several centimeters from the origin of the SFA proximally.  Completion angiogram showed brisk flow with less than 10% residual stenosis. I elected to terminate the procedure. The sheath was removed and StarClose closure device was deployed in the left femoral artery with excellent hemostatic result. The patient was taken to the recovery room in stable condition having tolerated the procedure well.  Findings:                            Right lower Extremity:  Normal common femoral artery, profunda femoris artery, with occlusion of the SFA about 5 to 7 cm beyond its origin.  It then reconstituted the distal SFA although there was disease in the proximal popliteal artery of greater than 70% stenosis.  The vessel then normalized and there was a normal tibial trifurcation.  The anterior tibial artery had a proximal stenosis and occluded above the ankle and was not really continuous into the foot.  The peroneal artery had its  typical course with no obvious focal stenoses seen.  The posterior tibial artery was patent and was the dominant runoff into the foot without focal stenosis   Disposition: Patient was taken to the recovery room in stable condition having tolerated the procedure well.  Complications: None  Leotis Pain 06/15/2018 11:00 AM   This note was created with Dragon Medical transcription system. Any errors in dictation are purely unintentional.

## 2018-06-20 ENCOUNTER — Telehealth (INDEPENDENT_AMBULATORY_CARE_PROVIDER_SITE_OTHER): Payer: Self-pay

## 2018-06-20 ENCOUNTER — Telehealth: Payer: Self-pay | Admitting: Family

## 2018-06-20 NOTE — Telephone Encounter (Signed)
Spoke with patient and Dr Bunnie Domino office has responded back to his message regarding pain medication.

## 2018-06-20 NOTE — Telephone Encounter (Signed)
Copied from [employer] [id number]. Topic: Quick Communication - See Telephone Encounter >> Jun 20, 2018 10:29 AM Mylinda Latina, NT wrote: CRM for notification. See Telephone encounter for: 06/20/18.Patient called and states he had surgery last Wednesday by Dr. Lucky Cowboy and he states they didn't prescribed him any pain meds. Patient states he is a lot of pain. Patient states he did call Dr. Lucky Cowboy office about this and he states he didn't get a RX.  Please call patient CB# [phone].

## 2018-06-20 NOTE — Telephone Encounter (Signed)
Left message for Dr Ozella Almond office on RN line advising them that patient had called our office in regards to pain medication post op.

## 2018-06-20 NOTE — Telephone Encounter (Signed)
Called patient and he states that he has called and left message for Dr. Bunnie Domino office to see if he could get some pain meds but he has not heard back from them. He want to know if [name] could prescribe him something.

## 2018-06-20 NOTE — Telephone Encounter (Signed)
Called the patient to get the name of the pharmacy where he wanted the prescription called in to. He stated [facility] on [address]. I called the authorized medication in today.

## 2018-06-20 NOTE — Telephone Encounter (Signed)
Please advise pt to follow with Dr dew and give him time to respond. Since he performed surgery, I would be more comfortable with him prescribing.

## 2018-06-20 NOTE — Telephone Encounter (Signed)
Cyril Mourning states that the patient has called their office more than once today requesting help with getting pain medication. He states that his pain is unbearable from the angio that he had done on last Wednesday. He states to her also that he has tried calling our office, and that no one in answering him? There have not been any messages from this patient today, collectively coming from all of the nurses here: Please advise.

## 2018-07-01 ENCOUNTER — Encounter: Payer: Self-pay | Admitting: Family

## 2018-07-01 ENCOUNTER — Ambulatory Visit: Payer: BLUE CROSS/BLUE SHIELD | Admitting: Family

## 2018-07-01 VITALS — BP 118/72 | HR 51 | Temp 98.4°F | Wt 131.2 lb

## 2018-07-01 DIAGNOSIS — N529 Male erectile dysfunction, unspecified: Secondary | ICD-10-CM | POA: Insufficient documentation

## 2018-07-01 DIAGNOSIS — F339 Major depressive disorder, recurrent, unspecified: Secondary | ICD-10-CM

## 2018-07-01 DIAGNOSIS — R634 Abnormal weight loss: Secondary | ICD-10-CM

## 2018-07-01 MED ORDER — MIRTAZAPINE 15 MG PO TABS: 15.0000 mg | ORAL_TABLET | Freq: Every day | ORAL | 1 refills | Status: DC

## 2018-07-01 MED ORDER — SILDENAFIL CITRATE 50 MG PO TABS: 50.0000 mg | ORAL_TABLET | Freq: Every day | ORAL | 1 refills | Status: DC | PRN

## 2018-07-01 NOTE — Progress Notes (Signed)
Subjective:    Patient ID: [name], male    DOB: 03-06-1951, 66 y.o.   MRN: [id number]  CC: [name] is a 67 y.o. male who presents today for follow up.   HPI: Still complains of fatigue. Weight is the same. Appetite has improved. Sleep okay. Some depression when thinks about CLL. Lives for family and obligation for family.  No si/hi.   States 5 or so years ago had been on Viagra which worked well for patient.  He would like this medication again to use from time to time.  No concerns for STDs.  No penile pain, penile discharge   Compliant with cholesterol medication. Non smoker  Recent right leg vascular surgery. Pain gone, walking normally. No longer on plavix    Following with Dr Lucky Cowboy, Dr Tasia Catchings ( last ov 6/14). PET scheduled HISTORY:  Past Medical History:  Diagnosis Date  . Anemia 2008  . Arthritis   . Cervicalgia   . 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 Tasia Catchings  . Hyperlipidemia   . Low back pain   . Peripheral vascular disease [facility])    Past Surgical History:  Procedure Laterality Date  . COLONOSCOPY    . COLONOSCOPY WITH PROPOFOL N/A 07/23/2015   Procedure: COLONOSCOPY WITH PROPOFOL;  Surgeon: [name], MD;  Location: [facility] ENDOSCOPY;  Service: Endoscopy;  Laterality: N/A;  . ESOPHAGOGASTRODUODENOSCOPY (EGD) WITH PROPOFOL N/A 08/20/2015   Procedure: ESOPHAGOGASTRODUODENOSCOPY (EGD) WITH PROPOFOL;  Surgeon: [name], MD;  Location: [facility] ENDOSCOPY;  Service: Endoscopy;  Laterality: N/A;  . LOWER EXTREMITY ANGIOGRAPHY Left 05/19/2018   Procedure: LOWER EXTREMITY ANGIOGRAPHY;  Surgeon: Algernon Huxley, MD;  Location: [place] CV LAB;  Service: Cardiovascular;  Laterality: Left;  . LOWER EXTREMITY ANGIOGRAPHY Right 06/15/2018   Procedure: LOWER EXTREMITY ANGIOGRAPHY;  Surgeon: Algernon Huxley, MD;  Location: [place] [facility];  Service: Cardiovascular;  Laterality: Right;  . lymp node removal Right 2011   arm   left arm 2015  .  LYMPH NODE BIOPSY  08/2014   Family History  Problem Relation Age of Onset  . Kidney cancer Neg Hx        lung cancer  . Bladder Cancer Neg Hx   . Prostate cancer Neg Hx     Allergies: Latex Current Outpatient Medications on File Prior to Visit  Medication Sig Dispense Refill  . aspirin EC 81 MG tablet Take 1 tablet (81 mg total) by mouth daily. 150 tablet 2  . ibuprofen (ADVIL,MOTRIN) 200 MG tablet Take 200 mg by mouth every 6 (six) hours as needed.    . lipase/protease/amylase (CREON) 36000 UNITS CPEP capsule Take 1 capsule (36,000 Units total) by mouth 3 (three) times daily with meals. 180 capsule 3  . MYRBETRIQ 25 MG TB24 tablet Take 1 tablet (25 mg total) by mouth daily. 90 tablet 0  . pravastatin (PRAVACHOL) 40 MG tablet Take 1 tablet (40 mg total) by mouth daily. 90 tablet 3  . traMADol (ULTRAM) 50 MG tablet Take 50 mg by mouth every 6 (six) hours as needed. for pain  1  . ranitidine (ZANTAC) 150 MG tablet Take 1 tablet (150 mg total) by mouth 2 (two) times daily for 7 days. 120 tablet 2   No current facility-administered medications on file prior to visit.     Social History   Tobacco Use  . Smoking status: Light Tobacco Smoker    Types: Cigars  . Smokeless tobacco: Never Used  .  Tobacco comment: quit cigarettes 30 years ago used to smoke 1 pk last 1 week no FH lung cancer   Substance Use Topics  . Alcohol use: No  . Drug use: No    Review of Systems  Constitutional: Positive for fatigue. Negative for chills and fever.  Respiratory: Negative for cough.   Cardiovascular: Negative for chest pain and palpitations.  Gastrointestinal: Negative for nausea and vomiting.  Psychiatric/Behavioral: Negative for sleep disturbance and suicidal ideas. The patient is not nervous/anxious.       Objective:    BP 118/72 (BP Location: Left Arm, Patient Position: Sitting, Cuff Size: Normal)   Pulse (!) 51   Temp 98.4 F (36.9 C) (Oral)   Wt 131 lb 4 oz (59.5 kg)   SpO2 96%    BMI 19.96 kg/m  BP Readings from Last 3 Encounters:  07/01/18 118/72  06/15/18 138/79  06/03/18 119/66   Wt Readings from Last 3 Encounters:  07/01/18 131 lb 4 oz (59.5 kg)  06/15/18 131 lb (59.4 kg)  06/03/18 131 lb 9.6 oz (59.7 kg)    Physical Exam  Constitutional: He appears well-developed and well-nourished.  Cardiovascular: Regular rhythm and normal heart sounds.  Pulmonary/Chest: Effort normal and breath sounds normal. No respiratory distress. He has no wheezes. He has no rhonchi. He has no rales.  Lymphadenopathy:       Head (left side): No submandibular and no preauricular adenopathy present.  Neurological: He is alert.  Skin: Skin is warm and dry.  Psychiatric: He has a normal mood and affect. His speech is normal and behavior is normal.  Vitals reviewed.      Assessment & Plan:   Problem List Items Addressed This Visit      Genitourinary   Erectile dysfunction    History of. Will give small supply viagra. In setting of PVD, have messaged Dr Lucky Cowboy for counsel as well. Patient will also discuss with him as well. Advised continued control of co morbidities, such as HLD.       Relevant Medications   sildenafil (VIAGRA) 50 MG tablet     Other   Weight loss    Weight is stable.  Hopeful that Remeron will help increase appetite.  Will follow      Depression, recurrent ([place]) - Primary    Unchanged.  Trial of Remeron in hopes to increase appetite, with mood.  Patient to let me know how he is doing.      Relevant Medications   mirtazapine (REMERON) 15 MG tablet       I have discontinued Mendy Ishii's albuterol and clopidogrel. I am also having him start on mirtazapine and sildenafil. Additionally, I am having him maintain his MYRBETRIQ, pravastatin, aspirin EC, ibuprofen, ranitidine, traMADol, and lipase/protease/amylase.   Meds ordered this encounter  Medications  . mirtazapine (REMERON) 15 MG tablet    Sig: Take 1 tablet (15 mg total) by mouth at bedtime.      Dispense:  90 tablet    Refill:  1    Order Specific Question:   Supervising Provider    Answer:   Deborra Medina L [[id number]]  . sildenafil (VIAGRA) 50 MG tablet    Sig: Take 1 tablet (50 mg total) by mouth daily as needed for erectile dysfunction.    Dispense:  10 tablet    Refill:  1    Order Specific Question:   Supervising Provider    Answer:   Crecencio Mc [[id number]]    Return precautions  given.   Risks, benefits, and alternatives of the medications and treatment plan prescribed today were discussed, and patient expressed understanding.   Education regarding symptom management and diagnosis given to patient on AVS.  Continue to follow with [name], FNP for routine health maintenance.   Earney Hamburg and I agreed with plan.   [name], FNP

## 2018-07-01 NOTE — Patient Instructions (Signed)
Trial of remeron. llet me know how you are doing on this . We may need to increase as well.   We will restart viagra  HOWEVER, as we discussed, I want you to discuss with Dr Lucky Cowboy. Please let him know as well. This is important in the setting of your vascular disease

## 2018-07-01 NOTE — Assessment & Plan Note (Signed)
Unchanged.  Trial of Remeron in hopes to increase appetite, with mood.  Patient to let me know how he is doing.

## 2018-07-01 NOTE — Assessment & Plan Note (Signed)
History of. Will give small supply viagra. In setting of PVD, have messaged Dr Lucky Cowboy for counsel as well. Patient will also discuss with him as well. Advised continued control of co morbidities, such as HLD.

## 2018-07-01 NOTE — Assessment & Plan Note (Signed)
Weight is stable.  Hopeful that Remeron will help increase appetite.  Will follow

## 2018-07-05 ENCOUNTER — Other Ambulatory Visit (INDEPENDENT_AMBULATORY_CARE_PROVIDER_SITE_OTHER): Payer: Self-pay | Admitting: Vascular Surgery

## 2018-07-05 ENCOUNTER — Ambulatory Visit: Payer: BLUE CROSS/BLUE SHIELD

## 2018-07-05 DIAGNOSIS — Z95820 Peripheral vascular angioplasty status with implants and grafts: Secondary | ICD-10-CM

## 2018-07-05 DIAGNOSIS — I70219 Atherosclerosis of native arteries of extremities with intermittent claudication, unspecified extremity: Secondary | ICD-10-CM

## 2018-07-06 ENCOUNTER — Ambulatory Visit (INDEPENDENT_AMBULATORY_CARE_PROVIDER_SITE_OTHER): Payer: BLUE CROSS/BLUE SHIELD

## 2018-07-06 ENCOUNTER — Encounter (INDEPENDENT_AMBULATORY_CARE_PROVIDER_SITE_OTHER): Payer: Self-pay | Admitting: Vascular Surgery

## 2018-07-06 ENCOUNTER — Ambulatory Visit (INDEPENDENT_AMBULATORY_CARE_PROVIDER_SITE_OTHER): Payer: BLUE CROSS/BLUE SHIELD | Admitting: Vascular Surgery

## 2018-07-06 ENCOUNTER — Telehealth: Payer: Self-pay | Admitting: *Deleted

## 2018-07-06 VITALS — BP 108/67 | HR 48 | Resp 12 | Ht 68.0 in | Wt 129.0 lb

## 2018-07-06 DIAGNOSIS — I739 Peripheral vascular disease, unspecified: Secondary | ICD-10-CM

## 2018-07-06 DIAGNOSIS — Z95820 Peripheral vascular angioplasty status with implants and grafts: Secondary | ICD-10-CM | POA: Diagnosis not present

## 2018-07-06 DIAGNOSIS — E785 Hyperlipidemia, unspecified: Secondary | ICD-10-CM | POA: Diagnosis not present

## 2018-07-06 DIAGNOSIS — Z87898 Personal history of other specified conditions: Secondary | ICD-10-CM | POA: Diagnosis not present

## 2018-07-06 DIAGNOSIS — I70219 Atherosclerosis of native arteries of extremities with intermittent claudication, unspecified extremity: Secondary | ICD-10-CM

## 2018-07-06 NOTE — Progress Notes (Signed)
Subjective:    Patient ID: [name], male    DOB: September 30, 1951, 67 y.o.   MRN: [id number] Chief Complaint  Patient presents with  . Follow-up    6 week ABI and [name] f/u   Patient presents for his first post procedure follow-up.  The patient is status post a left lower extremity angiogram with intervention in June 2019 and a right lower extremity angiogram with intervention in May 2019.  The patient notes a marked improvement in his symptoms.  The patient no longer experiences bilateral lower extremity claudication.  The patient denies any rest pain or ulcer formation to the bilateral lower extremity.  Sounds like the patient experienced some reperfusion syndrome immediately following his interventions however this is resolved.  The patient underwent a bilateral ABI which was notable for right/left ABI 1.15.  No evidence of significant bilateral lower extremity arterial disease.  Denies any fever, nausea vomiting.  Review of Systems  Constitutional: Negative.   HENT: Negative.   Eyes: Negative.   Respiratory: Negative.   Cardiovascular: Negative.   Gastrointestinal: Negative.   Endocrine: Negative.   Genitourinary: Negative.   Musculoskeletal: Negative.   Skin: Negative.   Allergic/Immunologic: Negative.   Neurological: Negative.   Hematological: Negative.   Psychiatric/Behavioral: Negative.       Objective:   Physical Exam  Constitutional: He is oriented to person, place, and time. He appears well-developed and well-nourished. No distress.  HENT:  Head: Normocephalic and atraumatic.  Right Ear: External ear normal.  Left Ear: External ear normal.  Mouth/Throat: Oropharynx is clear and moist.  Eyes: Pupils are equal, round, and reactive to light. Conjunctivae and EOM are normal.  Neck: Normal range of motion.  Cardiovascular: Normal rate, regular rhythm, normal heart sounds and intact distal pulses.  Pulses:      Radial pulses are 2+ on the right side, and 2+ on the left side.        Dorsalis pedis pulses are 2+ on the right side, and 2+ on the left side.       Posterior tibial pulses are 2+ on the right side, and 2+ on the left side.  Pulmonary/Chest: Effort normal and breath sounds normal.  Musculoskeletal: Normal range of motion. He exhibits no edema.  Neurological: He is alert and oriented to person, place, and time.  Skin: Skin is warm and dry. He is not diaphoretic.  Psychiatric: He has a normal mood and affect. His behavior is normal. Judgment and thought content normal.  Vitals reviewed.  BP 108/67 (BP Location: Right Arm, Patient Position: Sitting)   Pulse (!) 48   Resp 12   Ht 5\' 8"  (1.727 m)   Wt 129 lb (58.5 kg)   BMI 19.61 kg/m   Past Medical History:  Diagnosis Date  . Anemia 2008  . Arthritis   . Cervicalgia   . 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 Tasia Catchings  . Hyperlipidemia   . Low back pain   . Peripheral vascular disease ([place])    Social History   Socioeconomic History  . Marital status: Married    Spouse name: Not on file  . Number of children: Not on file  . Years of education: Not on file  . Highest education level: Not on file  Occupational History  . Not on file  Social Needs  . Financial resource strain: Not on file  . Food insecurity:    Worry: Not on file    Inability: Not on  file  . Transportation needs:    Medical: Not on file    Non-medical: Not on file  Tobacco Use  . Smoking status: Light Tobacco Smoker    Types: Cigars  . Smokeless tobacco: Never Used  . Tobacco comment: quit cigarettes 30 years ago used to smoke 1 pk last 1 week no FH lung cancer   Substance and Sexual Activity  . Alcohol use: No  . Drug use: No  . Sexual activity: Never  Lifestyle  . Physical activity:    Days per week: Not on file    Minutes per session: Not on file  . Stress: Not on file  Relationships  . Social connections:    Talks on phone: Not on file    Gets together: Not on file    Attends  religious service: Not on file    Active member of club or organization: Not on file    Attends meetings of clubs or organizations: Not on file    Relationship status: Not on file  . Intimate partner violence:    Fear of current or ex partner: Not on file    Emotionally abused: Not on file    Physically abused: Not on file    Forced sexual activity: Not on file  Other Topics Concern  . 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       Past Surgical History:  Procedure Laterality Date  . COLONOSCOPY    . COLONOSCOPY WITH PROPOFOL N/A 07/23/2015   Procedure: COLONOSCOPY WITH PROPOFOL;  Surgeon: [name], MD;  Location: [facility] ENDOSCOPY;  Service: Endoscopy;  Laterality: N/A;  . ESOPHAGOGASTRODUODENOSCOPY (EGD) WITH PROPOFOL N/A 08/20/2015   Procedure: ESOPHAGOGASTRODUODENOSCOPY (EGD) WITH PROPOFOL;  Surgeon: [name], MD;  Location: [facility] ENDOSCOPY;  Service: Endoscopy;  Laterality: N/A;  . LOWER EXTREMITY ANGIOGRAPHY Left 05/19/2018   Procedure: LOWER EXTREMITY ANGIOGRAPHY;  Surgeon: Algernon Huxley, MD;  Location: [place] CV LAB;  Service: Cardiovascular;  Laterality: Left;  . LOWER EXTREMITY ANGIOGRAPHY Right 06/15/2018   Procedure: LOWER EXTREMITY ANGIOGRAPHY;  Surgeon: Algernon Huxley, MD;  Location: [place] CV LAB;  Service: Cardiovascular;  Laterality: Right;  . lymp node removal Right 2011   arm   left arm 2015  . LYMPH NODE BIOPSY  08/2014   Family History  Problem Relation Age of Onset  . Kidney cancer Neg Hx        lung cancer  . Bladder Cancer Neg Hx   . Prostate cancer Neg Hx    Allergies  Allergen Reactions  . Latex Rash      Assessment & Plan:  Patient presents for his first post procedure follow-up.  The patient is status post a left lower extremity angiogram with intervention in June 2019 and a right lower extremity angiogram with intervention in May 2019.  The patient notes a marked improvement  in his symptoms.  The patient no longer experiences bilateral lower extremity claudication.  The patient denies any rest pain or ulcer formation to the bilateral lower extremity.  Sounds like the patient experienced some reperfusion syndrome immediately following his interventions however this is resolved.  The patient underwent a bilateral ABI which was notable for right/left ABI 1.15.  No evidence of significant bilateral lower extremity arterial disease.  Denies any fever, nausea vomiting.  1. Peripheral vascular disease ([place]) - Stable Patient is status post bilateral lower extremity angiogram with intervention The  patient notes a market improvement in his claudication-like symptoms.  And denies any rest pain or ulceration to the bilateral lower extremity ABI today with no evidence of lower extremity arterial disease The patient is to follow-up in 6 months and undergo an ABI and bilateral lower extremity arterial duplex I have discussed with the patient at length the risk factors for and pathogenesis of atherosclerotic disease and encouraged a healthy diet, regular exercise regimen and blood pressure / glucose control.  The patient was encouraged to call the office in the interim if he experiences any claudication like symptoms, rest pain or ulcers to his feet / toes.  - VAS [name] ABI WITH/WO TBI; Future - VAS [name] LOWER EXTREMITY ARTERIAL DUPLEX; Future  2. Hyperlipidemia, unspecified hyperlipidemia type - Stable Encouraged good control as its slows the progression of atherosclerotic disease  3. History of prediabetes - Stable Encouraged good control as its slows the progression of atherosclerotic disease  Current Outpatient Medications on File Prior to Visit  Medication Sig Dispense Refill  . aspirin EC 81 MG tablet Take 1 tablet (81 mg total) by mouth daily. 150 tablet 2  . ibuprofen (ADVIL,MOTRIN) 200 MG tablet Take 200 mg by mouth every 6 (six) hours as needed.    . lipase/protease/amylase  (CREON) 36000 UNITS CPEP capsule Take 1 capsule (36,000 Units total) by mouth 3 (three) times daily with meals. 180 capsule 3  . mirtazapine (REMERON) 15 MG tablet Take 1 tablet (15 mg total) by mouth at bedtime. 90 tablet 1  . MYRBETRIQ 25 MG TB24 tablet Take 1 tablet (25 mg total) by mouth daily. 90 tablet 0  . pravastatin (PRAVACHOL) 40 MG tablet Take 1 tablet (40 mg total) by mouth daily. 90 tablet 3  . ranitidine (ZANTAC) 150 MG tablet Take 1 tablet (150 mg total) by mouth 2 (two) times daily for 7 days. 120 tablet 2  . sildenafil (VIAGRA) 50 MG tablet Take 1 tablet (50 mg total) by mouth daily as needed for erectile dysfunction. 10 tablet 1  . traMADol (ULTRAM) 50 MG tablet Take 50 mg by mouth every 6 (six) hours as needed. for pain  1   No current facility-administered medications on file prior to visit.    There are no Patient Instructions on file for this visit. No follow-ups on file.  Chancie Lampert A Hira Trent, PA-C

## 2018-07-06 NOTE — Telephone Encounter (Signed)
Patient PET scan was R/S due to being a No Show on 07/05/18 Per [name] 07/06/18 scheduler message to R/S. He was R/S for 07/15/18 @ 11:00  Patient is aware of location, date and time of the scheduled appt.

## 2018-07-13 ENCOUNTER — Ambulatory Visit: Payer: BLUE CROSS/BLUE SHIELD

## 2018-07-15 ENCOUNTER — Ambulatory Visit (HOSPITAL_COMMUNITY): Payer: BLUE CROSS/BLUE SHIELD

## 2018-08-01 ENCOUNTER — Ambulatory Visit: Payer: BLUE CROSS/BLUE SHIELD | Admitting: Family

## 2018-08-01 DIAGNOSIS — Z0289 Encounter for other administrative examinations: Secondary | ICD-10-CM

## 2018-08-03 ENCOUNTER — Inpatient Hospital Stay: Payer: BLUE CROSS/BLUE SHIELD

## 2018-08-03 ENCOUNTER — Inpatient Hospital Stay: Payer: BLUE CROSS/BLUE SHIELD | Admitting: Oncology

## 2018-08-10 ENCOUNTER — Encounter: Payer: Self-pay | Admitting: Family

## 2018-08-16 ENCOUNTER — Inpatient Hospital Stay: Payer: BLUE CROSS/BLUE SHIELD

## 2018-08-16 ENCOUNTER — Inpatient Hospital Stay: Payer: BLUE CROSS/BLUE SHIELD | Admitting: Oncology

## 2018-08-17 ENCOUNTER — Encounter: Payer: Self-pay | Admitting: *Deleted

## 2018-08-17 ENCOUNTER — Encounter: Payer: Self-pay | Admitting: Oncology

## 2018-09-02 ENCOUNTER — Telehealth: Payer: Self-pay | Admitting: *Deleted

## 2018-09-02 ENCOUNTER — Encounter: Payer: Self-pay | Admitting: *Deleted

## 2018-09-02 ENCOUNTER — Ambulatory Visit: Payer: BLUE CROSS/BLUE SHIELD | Admitting: Family

## 2018-09-02 DIAGNOSIS — Z122 Encounter for screening for malignant neoplasm of respiratory organs: Secondary | ICD-10-CM

## 2018-09-02 DIAGNOSIS — Z0289 Encounter for other administrative examinations: Secondary | ICD-10-CM

## 2018-09-02 NOTE — Telephone Encounter (Signed)
Received a referral for initial lung cancer screening scan.  Contacted the patient and obtained their smoking history, currently smokes 1 ppd 46pkyr history  as well as answering questions related to screening process.  Patient denies signs of lung cancer such as weight loss or hemoptysis at this time.  Patient denies comorbidity that would prevent curative treatment if lung cancer were found.  Patient is scheduled for the Shared Decision Making Visit and CT scan on 09-22-18@1415 

## 2018-09-21 ENCOUNTER — Telehealth: Payer: Self-pay | Admitting: *Deleted

## 2018-09-21 NOTE — Telephone Encounter (Signed)
Called patient to remind them of their appt for ldct screening on 09-22-18@1415 , unable to leave message on phone at this time.

## 2018-09-22 ENCOUNTER — Ambulatory Visit
Admission: RE | Admit: 2018-09-22 | Discharge: 2018-09-22 | Disposition: A | Discharge status: Home / Self Care | Payer: BLUE CROSS/BLUE SHIELD | Source: Ambulatory Visit | Attending: Nurse Practitioner | Admitting: Nurse Practitioner

## 2018-09-22 ENCOUNTER — Inpatient Hospital Stay: Payer: BLUE CROSS/BLUE SHIELD | Attending: Nurse Practitioner | Admitting: Nurse Practitioner

## 2018-09-22 ENCOUNTER — Encounter: Payer: Self-pay | Admitting: Nurse Practitioner

## 2018-09-22 DIAGNOSIS — Z122 Encounter for screening for malignant neoplasm of respiratory organs: Secondary | ICD-10-CM | POA: Insufficient documentation

## 2018-09-22 DIAGNOSIS — J439 Emphysema, unspecified: Secondary | ICD-10-CM | POA: Diagnosis not present

## 2018-09-22 DIAGNOSIS — I2510 Atherosclerotic heart disease of native coronary artery without angina pectoris: Secondary | ICD-10-CM | POA: Diagnosis not present

## 2018-09-22 DIAGNOSIS — Z87891 Personal history of nicotine dependence: Secondary | ICD-10-CM | POA: Diagnosis not present

## 2018-09-22 DIAGNOSIS — I700 Atherosclerosis of aorta: Secondary | ICD-10-CM | POA: Diagnosis not present

## 2018-09-22 DIAGNOSIS — F17210 Nicotine dependence, cigarettes, uncomplicated: Secondary | ICD-10-CM | POA: Diagnosis not present

## 2018-09-23 ENCOUNTER — Encounter: Payer: Self-pay | Admitting: *Deleted

## 2018-09-23 ENCOUNTER — Telehealth: Payer: Self-pay | Admitting: Family

## 2018-09-23 DIAGNOSIS — I2510 Atherosclerotic heart disease of native coronary artery without angina pectoris: Secondary | ICD-10-CM

## 2018-09-23 NOTE — Telephone Encounter (Signed)
Please mail letter to patient:   [name] you are doing well.   I received results from your annual CT lung scan from [facility].  It was noted on the exam that you have coronary artery atherosclerosis, or often referred to as hardening of the arteries.   This can put you at risk for stroke, heart attack.   Please make a follow up appointment with me so we can discuss lifestyle changes, if there is a need for cardiology evaluation, and so we can repeat your cholesterol since it has been a year since we last checked.   Look forward to seeing you!   Best,   [name], NP

## 2018-09-28 NOTE — Telephone Encounter (Signed)
Mailed letter to patient

## 2018-10-10 ENCOUNTER — Encounter: Payer: Self-pay | Admitting: Family

## 2018-10-10 ENCOUNTER — Ambulatory Visit: Payer: BLUE CROSS/BLUE SHIELD | Admitting: Family

## 2018-10-10 VITALS — BP 114/64 | HR 63 | Temp 97.8°F | Resp 16 | Ht 68.0 in | Wt 128.5 lb

## 2018-10-10 DIAGNOSIS — I2510 Atherosclerotic heart disease of native coronary artery without angina pectoris: Secondary | ICD-10-CM

## 2018-10-10 DIAGNOSIS — K219 Gastro-esophageal reflux disease without esophagitis: Secondary | ICD-10-CM | POA: Diagnosis not present

## 2018-10-10 DIAGNOSIS — Z23 Encounter for immunization: Secondary | ICD-10-CM

## 2018-10-10 DIAGNOSIS — I739 Peripheral vascular disease, unspecified: Secondary | ICD-10-CM | POA: Diagnosis not present

## 2018-10-10 DIAGNOSIS — F339 Major depressive disorder, recurrent, unspecified: Secondary | ICD-10-CM

## 2018-10-10 DIAGNOSIS — C9111 Chronic lymphocytic leukemia of B-cell type in remission: Secondary | ICD-10-CM

## 2018-10-10 MED ORDER — FLUOXETINE HCL 20 MG PO CAPS: 20.0000 mg | ORAL_CAPSULE | Freq: Every morning | ORAL | 3 refills | Status: DC

## 2018-10-10 MED ORDER — PRAVASTATIN SODIUM 40 MG PO TABS: 40.0000 mg | ORAL_TABLET | Freq: Every day | ORAL | 3 refills | Status: DC

## 2018-10-10 MED ORDER — FAMOTIDINE 20 MG PO TABS: 20.0000 mg | ORAL_TABLET | Freq: Two times a day (BID) | ORAL | 1 refills | Status: DC

## 2018-10-10 NOTE — Assessment & Plan Note (Signed)
Will follow.

## 2018-10-10 NOTE — Patient Instructions (Addendum)
Consider gabapentin ( oral) and/or capsaicin ( topical cream, over the counter).   Stop zantac due to recall.   Trial of pepcid AC twice per day, take before largest meals - breakfast and dinnner. Let me know if belching doesn't improve  Trial of Prozac. Let me know if mood improves.   Please start back on pravachol since we have seen cholesterol on CT chest  At follow up, please fast so we can do physical labs.   Bring wife to follow up as we discussed so we can discuss prozac.

## 2018-10-10 NOTE — Assessment & Plan Note (Signed)
Stable. Known lymphadenopathy.Following with Dr Tasia Catchings.  Advised that I did not see me follow-up appointment scheduled through epic.  Advised the importance of following up with Dr. Tasia Catchings;  patient understood.  He states he will call to make a follow-up appointment.  Will follow

## 2018-10-10 NOTE — Assessment & Plan Note (Signed)
Discussed atherosclerosis seen on CT chest..  Advised the importance of staying on cholesterol medication.  I have refilled this today.

## 2018-10-10 NOTE — Assessment & Plan Note (Signed)
Overall controlled.  Advised to stop Zantac due to recall.  Advised to start over-the-counter Pepcid AC twice daily.  Advised to use consistently for next couple weeks and let me know if belching does not resolve.  Patient will let  Me know.

## 2018-10-10 NOTE — Progress Notes (Signed)
Subjective:    Patient ID: [name], male    DOB: [birth year], 67 y.o.   MRN: [id number]  CC: [name] is a 67 y.o. male who presents today for follow up.   HPI: Would like to discuss CT chest results.   Depression- Not on mirtazapine, as after one pill felt like 'zombie.'  Not sure what the hold up with starting medication for CLL which contributes to depression. No anxiety. No si/hi.  Uses prn ibuprofen, not daily.    CLL-Has palpable right axillary lymphadenopathy which Dr Tasia Catchings is aware of and measures per patient.   GERD- Appetite has improved. Eats breakfast and dinner. Doesn't eat tomatoes as will trigger reflux. Weight stable. Endorses belching after eating, resolves with zantac. No trouble swallowing.   HLD- ran out of cholesterol medication, would like refill.    F/u with Dr Lucky Cowboy 1/202020  F/u with Dr Tasia Catchings not scheduled  Colonoscopy UTD Due for CPE labs  CT chest stable lymphadenopathy bilateral axillary regions 07/2017; never had PET scan.   HISTORY:  Past Medical History:  Diagnosis Date  . Anemia 2008  . Arthritis   . Cervicalgia   . Chronic lymphocytic leukemia (CLL), B-cell ([place]) 06/15/2015   Dr. Tasia Catchings  . Hyperlipidemia   . Low back pain   . Peripheral vascular disease [facility])    Past Surgical History:  Procedure Laterality Date  . COLONOSCOPY    . COLONOSCOPY WITH PROPOFOL N/A 07/23/2015   Procedure: COLONOSCOPY WITH PROPOFOL;  Surgeon: [name], MD;  Location: [facility] ENDOSCOPY;  Service: Endoscopy;  Laterality: N/A;  . ESOPHAGOGASTRODUODENOSCOPY (EGD) WITH PROPOFOL N/A 08/20/2015   Procedure: ESOPHAGOGASTRODUODENOSCOPY (EGD) WITH PROPOFOL;  Surgeon: [name], MD;  Location: [facility] ENDOSCOPY;  Service: Endoscopy;  Laterality: N/A;  . LOWER EXTREMITY ANGIOGRAPHY Left 05/19/2018   Procedure: LOWER EXTREMITY ANGIOGRAPHY;  Surgeon: Algernon Huxley, MD;  Location: [place] CV LAB;  Service: Cardiovascular;  Laterality: Left;  . LOWER EXTREMITY  ANGIOGRAPHY Right 06/15/2018   Procedure: LOWER EXTREMITY ANGIOGRAPHY;  Surgeon: Algernon Huxley, MD;  Location: [place] CV LAB;  Service: Cardiovascular;  Laterality: Right;  . lymp node removal Right 2011   arm   left arm 2015  . LYMPH NODE BIOPSY  08/2014   Family History  Problem Relation Age of Onset  . Kidney cancer Neg Hx        lung cancer  . Bladder Cancer Neg Hx   . Prostate cancer Neg Hx     Allergies: Latex Current Outpatient Medications on File Prior to Visit  Medication Sig Dispense Refill  . aspirin EC 81 MG tablet Take 1 tablet (81 mg total) by mouth daily. (Patient not taking: Reported on 10/10/2018) 150 tablet 2  . ibuprofen (ADVIL,MOTRIN) 200 MG tablet Take 200 mg by mouth every 6 (six) hours as needed.    . lipase/protease/amylase (CREON) 36000 UNITS CPEP capsule Take 1 capsule (36,000 Units total) by mouth 3 (three) times daily with meals. (Patient not taking: Reported on 10/10/2018) 180 capsule 3  . MYRBETRIQ 25 MG TB24 tablet Take 1 tablet (25 mg total) by mouth daily. (Patient not taking: Reported on 10/10/2018) 90 tablet 0  . sildenafil (VIAGRA) 50 MG tablet Take 1 tablet (50 mg total) by mouth daily as needed for erectile dysfunction. (Patient not taking: Reported on 10/10/2018) 10 tablet 1  . traMADol (ULTRAM) 50 MG tablet Take 50 mg by mouth every 6 (six) hours as needed. for pain  1  No current facility-administered medications on file prior to visit.     Social History   Tobacco Use  . Smoking status: Heavy Tobacco Smoker    Packs/day: 1.00    Years: 48.00    Pack years: 48.00    Types: Cigars  . Smokeless tobacco: Never Used  . Tobacco comment: quit cigarettes 30 years ago used to smoke 1 pk last 1 week no FH lung cancer   Substance Use Topics  . Alcohol use: No  . Drug use: No    Review of Systems  Constitutional: Negative for chills and fever.  Respiratory: Negative for cough.   Cardiovascular: Negative for chest pain and palpitations.    Gastrointestinal: Negative for abdominal distention, constipation, nausea and vomiting.  Hematological: Positive for adenopathy (chronic, right axillary).  Psychiatric/Behavioral: Negative for sleep disturbance and suicidal ideas. The patient is not nervous/anxious.       Objective:    BP 114/64 (BP Location: Left Arm, Patient Position: Sitting, Cuff Size: Normal)   Pulse 63   Temp 97.8 F (36.6 C) (Oral)   Resp 16   Ht 5\' 8"  (1.727 m)   Wt 128 lb 8 oz (58.3 kg)   SpO2 99%   BMI 19.54 kg/m  BP Readings from Last 3 Encounters:  10/10/18 114/64  07/06/18 108/67  07/01/18 118/72   Wt Readings from Last 3 Encounters:  10/10/18 128 lb 8 oz (58.3 kg)  09/22/18 130 lb (59 kg)  07/06/18 129 lb (58.5 kg)    Physical Exam  Constitutional: He appears well-developed and well-nourished.  Cardiovascular: Regular rhythm and normal heart sounds.  Pulmonary/Chest: Effort normal and breath sounds normal. No respiratory distress. He has no wheezes. He has no rhonchi. He has no rales.  Lymphadenopathy:    He has axillary adenopathy.       Right axillary: Lateral adenopathy present.  One 2-3cm palpable, soft movable lymph nodes appreciated. Not fluctuant.   Neurological: He is alert.  Skin: Skin is warm and dry.  Psychiatric: He has a normal mood and affect. His speech is normal and behavior is normal.  Vitals reviewed.      Assessment & Plan:   Problem List Items Addressed This Visit      Cardiovascular and Mediastinum   Coronary atherosclerosis    Discussed atherosclerosis seen on CT chest..  Advised the importance of staying on cholesterol medication.  I have refilled this today.      Relevant Medications   pravastatin (PRAVACHOL) 40 MG tablet   Peripheral vascular disease ([place])    Will follow.       Relevant Medications   pravastatin (PRAVACHOL) 40 MG tablet     Digestive   Gastroesophageal reflux disease    Overall controlled.  Advised to stop Zantac due to recall.   Advised to start over-the-counter Pepcid AC twice daily.  Advised to use consistently for next couple weeks and let me know if belching does not resolve.  Patient will let  Me know.       Relevant Medications   famotidine (PEPCID) 20 MG tablet     Other   Chronic lymphocytic leukemia (CLL), B-cell ([facility])    Stable. Known lymphadenopathy.Following with Dr Tasia Catchings.  Advised that I did not see me follow-up appointment scheduled through epic.  Advised the importance of following up with Dr. Tasia Catchings;  patient understood.  He states he will call to make a follow-up appointment.  Will follow      Depression, recurrent ([place]) -  Primary    Unchanged.  I expressed my concern based on patient's PHQ 9 score today.  Strongly encouraged him to try Prozac to help with depression and also with weight gain.  Declines counseling today.  Close follow-up with patient in 3 months.  Advised to bring wife this visit so we can see her Prozac is working from her viewpoint.  Patient verbalized understanding and in agreement with this plan.      Relevant Medications   FLUoxetine (PROZAC) 20 MG capsule    Other Visit Diagnoses    Need for pneumococcal vaccination       Relevant Orders   Pneumococcal conjugate vaccine 13-valent IM (Completed)   Need for immunization against influenza       Relevant Orders   Flu Vaccine QUAD 36+ mos IM (Completed)       I have discontinued [name]'s ranitidine and mirtazapine. I am also having him start on famotidine and FLUoxetine. Additionally, I am having him maintain his MYRBETRIQ, aspirin EC, ibuprofen, traMADol, lipase/protease/amylase, sildenafil, and pravastatin.   Meds ordered this encounter  Medications  . famotidine (PEPCID) 20 MG tablet    Sig: Take 1 tablet (20 mg total) by mouth 2 (two) times daily.    Dispense:  120 tablet    Refill:  1    Order Specific Question:   Supervising Provider    Answer:   Deborra Medina L [[id number]]  . pravastatin (PRAVACHOL) 40 MG tablet     Sig: Take 1 tablet (40 mg total) by mouth daily.    Dispense:  90 tablet    Refill:  3    Generic ok    Order Specific Question:   Supervising Provider    Answer:   Derrel Nip, TERESA L [[id number]]  . FLUoxetine (PROZAC) 20 MG capsule    Sig: Take 1 capsule (20 mg total) by mouth every morning.    Dispense:  90 capsule    Refill:  3    Order Specific Question:   Supervising Provider    Answer:   Crecencio Mc [[id number]]    Return precautions given.   Risks, benefits, and alternatives of the medications and treatment plan prescribed today were discussed, and patient expressed understanding.   Education regarding symptom management and diagnosis given to patient on AVS.  Continue to follow with [name], FNP for routine health maintenance.   Earney Hamburg and I agreed with plan.   [name], FNP

## 2018-10-10 NOTE — Assessment & Plan Note (Signed)
Unchanged.  I expressed my concern based on patient's PHQ 9 score today.  Strongly encouraged him to try Prozac to help with depression and also with weight gain.  Declines counseling today.  Close follow-up with patient in 3 months.  Advised to bring wife this visit so we can see her Prozac is working from her viewpoint.  Patient verbalized understanding and in agreement with this plan.

## 2019-01-10 ENCOUNTER — Ambulatory Visit (INDEPENDENT_AMBULATORY_CARE_PROVIDER_SITE_OTHER): Payer: BLUE CROSS/BLUE SHIELD | Admitting: Vascular Surgery

## 2019-01-10 ENCOUNTER — Encounter (INDEPENDENT_AMBULATORY_CARE_PROVIDER_SITE_OTHER): Payer: BLUE CROSS/BLUE SHIELD

## 2019-01-11 ENCOUNTER — Ambulatory Visit: Payer: BLUE CROSS/BLUE SHIELD | Admitting: Family

## 2019-01-11 DIAGNOSIS — Z0289 Encounter for other administrative examinations: Secondary | ICD-10-CM

## 2019-01-24 DIAGNOSIS — B349 Viral infection, unspecified: Secondary | ICD-10-CM | POA: Diagnosis not present

## 2019-02-03 ENCOUNTER — Encounter: Payer: Self-pay | Admitting: Family

## 2019-02-03 ENCOUNTER — Ambulatory Visit: Payer: BLUE CROSS/BLUE SHIELD | Admitting: Family

## 2019-02-03 ENCOUNTER — Ambulatory Visit (INDEPENDENT_AMBULATORY_CARE_PROVIDER_SITE_OTHER): Payer: BLUE CROSS/BLUE SHIELD

## 2019-02-03 VITALS — BP 92/58 | HR 55 | Temp 97.9°F | Wt 131.2 lb

## 2019-02-03 DIAGNOSIS — R05 Cough: Secondary | ICD-10-CM

## 2019-02-03 DIAGNOSIS — R059 Cough, unspecified: Secondary | ICD-10-CM | POA: Insufficient documentation

## 2019-02-03 DIAGNOSIS — K219 Gastro-esophageal reflux disease without esophagitis: Secondary | ICD-10-CM | POA: Diagnosis not present

## 2019-02-03 DIAGNOSIS — R0789 Other chest pain: Secondary | ICD-10-CM

## 2019-02-03 DIAGNOSIS — R634 Abnormal weight loss: Secondary | ICD-10-CM

## 2019-02-03 DIAGNOSIS — I2510 Atherosclerotic heart disease of native coronary artery without angina pectoris: Secondary | ICD-10-CM

## 2019-02-03 MED ORDER — AMOXICILLIN-POT CLAVULANATE 875-125 MG PO TABS: 1.0000 | ORAL_TABLET | Freq: Two times a day (BID) | ORAL | 0 refills | Status: DC

## 2019-02-03 MED ORDER — BENZONATATE 100 MG PO CAPS: 100.0000 mg | ORAL_CAPSULE | Freq: Two times a day (BID) | ORAL | 0 refills | Status: DC | PRN

## 2019-02-03 MED ORDER — OMEPRAZOLE 20 MG PO CPDR: 20.0000 mg | DELAYED_RELEASE_CAPSULE | Freq: Every day | ORAL | 3 refills | Status: DC

## 2019-02-03 NOTE — Assessment & Plan Note (Signed)
Strongly encouraged patient to retrial Prozac.

## 2019-02-03 NOTE — Assessment & Plan Note (Addendum)
No acute respiratory distress.  Based on duration of symptoms, reasonable to start antibiotic.  SaO2 96%.  No adventitious lung sounds.  Pending chest x-ray.  Patient will let me know how he is doing.

## 2019-02-03 NOTE — Assessment & Plan Note (Signed)
Advised to continue pravastatin.

## 2019-02-03 NOTE — Assessment & Plan Note (Addendum)
Concern based on histories of CAD, patient's age.  Occurs with coughing.  The onset does correlate with cough.  EKG today shows no significant changes from prior to 2019.  No acute ischemia. EKG reviewed with Dr Derrel Nip.  Consulted with Rockey Situ ( he was driving so unable to view EKG). Agreed patient does have risk factors, referral placed to his office.  Advised very close vigilance to patient, any recurrence of symptom, or development of new symptoms, he understands to go directly to emergency room.  Information also include on his AVS.  Of note, paged Dr Josefa Half on call twice, no response 02/03/19.

## 2019-02-03 NOTE — Assessment & Plan Note (Signed)
Advised trial of Prilosec.  Pending labs, right upper quadrant ultrasound.

## 2019-02-03 NOTE — Patient Instructions (Addendum)
Start augmentin ; take with food  Ensure to take probiotics while on antibiotics and also for 2 weeks after completion. It is important to re-colonize the gut with good bacteria and also to prevent any diarrheal infections associated with antibiotic use.   Tessalon for cough  Mucinex with PLENTY of water  Please restart prozac  Trial of prilosec every day for reflux.   We need close follow up with cardiology. We will be calling you with an appointment. If you do not hear from [name] in one week in regards to this , please call.   Any re-occurence of chest pressure, or new symptoms such as chest pain, shortness of breath, please go to nearest emergency room.    Heart Attack A heart attack (myocardial infarction, MI) is when blood and oxygen supply to the heart is cut off. A heart attack causes damage to the heart that cannot be fixed. If you think you are having a heart attack, do not wait to see if the symptoms will go away. Get medical help right away. What are the signs or symptoms? Symptoms of this condition include:  Chest pain. It may feel like: ? Crushing or squeezing. ? Tightness, pressure, fullness, or heaviness.  Pain in the arm, neck, jaw, back, or upper body.  Shortness of breath.  Heartburn.  Indigestion.  Nausea.  Cold sweats.  Feeling tired.  Sudden lightheadedness. Follow these instructions at home: Medicines  Take over-the-counter and prescription medicines only as told by your doctor. You may need to take medicine: ? To keep your blood from clotting too easily. ? To control blood pressure. ? To lower cholesterol. ? To control heart rhythms.  Do not take these medicines unless your doctor says it is okay: ? NSAIDs. ? Supplements that have vitamin A, vitamin E, or both. ? Hormone replacement therapy that has estrogen with or without progestin. Lifestyle   Do not use any products that have nicotine or tobacco. This includes cigarettes and  e-cigarettes. If you need help quitting, ask your doctor.  Avoid secondhand smoke.  Exercise regularly. Ask your doctor about a cardiac rehab program.  Eat heart-healthy foods. A diet and nutrition specialist (registered dietitian) can help you form healthy eating habits.  Stay at a healthy weight.  Lower your stress level.  Do not use illegal drugs. Alcohol use  Do not drink alcohol if: ? Your doctor tells you not to drink. ? You are pregnant, may be pregnant, or are planning to become pregnant.  If you drink alcohol, limit how much you have: ? 0-1 drink a day for women. ? 0-2 drinks a day for men.  Know how much alcohol is in your drink. In the [place] one drink equals one typical bottle of beer (12 oz), one-half glass of wine (5 oz), or one shot of hard liquor (1 oz). General instructions  Work with your doctor to treat other problems you have, like diabetes.  Get screened for depression, and get treatment if needed.  Keep your vaccinations up to date. Get the flu shot (influenza vaccination) every year.  Keep all follow-up visits as told by your doctor. This is important. Contact a doctor if:  You feel sad.  You have trouble doing your daily activities. Get help right away if you:  Have sudden, unexplained discomfort in your: ? Chest. ? Arms. ? Back. ? Neck. ? Jaw. ? Upper body.  Have shortness of breath.  Have sudden sweating or clammy skin.  Feel sick to  your stomach (nauseous).  Throw up (vomit).  Suddenly get lightheaded or dizzy.  Feel your heart beating fast.  Feel your heart skipping beats.  Have blood pressure that is higher than 180/120. These symptoms may be an emergency. Do not wait to see if the symptoms will go away. Get medical help right away. Call your local emergency services (911 in the [place] Do not drive yourself to the hospital. Summary  A heart attack is when blood and oxygen supply to the heart is cut off.  You should not  take NSAIDs unless your doctor says it is okay.  Do not smoke. Avoid secondhand smoke.  Exercise regularly. Ask your doctor about a cardiac rehab program. This information is not intended to replace advice given to you by your health care provider. Make sure you discuss any questions you have with your health care provider. Document Released: 06/07/2012 Document Revised: 08/31/2018 Document Reviewed: 01/21/2018 Elsevier Interactive Patient Education  2019 [employer].

## 2019-02-03 NOTE — Progress Notes (Signed)
Subjective:    Patient ID: [name], male    DOB: [birth year], 68 y.o.   MRN: [id number]  CC: [name] is a 68 y.o. male who presents today for follow up.   HPI: Recent productive cough, couple of weeks ago, improved. Accompanied by wife today.   Still has thick mucous, nasal congestion. Makes him nauseated.  No wheezing, sob, vomiting. On mucinex with some relief. No recent antibiotics.   Also describes left sided chest pressure when coughing, particularly during coughing spells.  This started 2 weeks ago with onset of cough. Notes ' pressure' when coughing.   Saw [facility] a few years ago. States had a normal stress test. Denies palpitations, dizziness, frequent headaches, changes in vision, or shortness of breath.   H/o left axillary lymph node removed. Has left arm numbness since then. This is unchanged.  No jaw pain.   GERD- Continues upper abdomen pain after meals. No abdominal pain today. No fever. Not taking Pepcid.  HLD- compliant pravachol  Depression- not on prozac. Appetite and mood had improved on medication. No si/hi.   HISTORY:  Past Medical History:  Diagnosis Date  . Anemia 2008  . Arthritis   . Cervicalgia   . 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 Tasia Catchings  . Hyperlipidemia   . Low back pain   . Peripheral vascular disease [facility])    Past Surgical History:  Procedure Laterality Date  . COLONOSCOPY    . COLONOSCOPY WITH PROPOFOL N/A 07/23/2015   Procedure: COLONOSCOPY WITH PROPOFOL;  Surgeon: [name], MD;  Location: [facility] ENDOSCOPY;  Service: Endoscopy;  Laterality: N/A;  . ESOPHAGOGASTRODUODENOSCOPY (EGD) WITH PROPOFOL N/A 08/20/2015   Procedure: ESOPHAGOGASTRODUODENOSCOPY (EGD) WITH PROPOFOL;  Surgeon: [name], MD;  Location: [facility] ENDOSCOPY;  Service: Endoscopy;  Laterality: N/A;  . LOWER EXTREMITY ANGIOGRAPHY Left 05/19/2018   Procedure: LOWER EXTREMITY ANGIOGRAPHY;  Surgeon: Algernon Huxley, MD;  Location: [facility] CV LAB;  Service: Cardiovascular;  Laterality: Left;  . LOWER EXTREMITY ANGIOGRAPHY Right 06/15/2018   Procedure: LOWER EXTREMITY ANGIOGRAPHY;  Surgeon: Algernon Huxley, MD;  Location: [facility];  Service: Cardiovascular;  Laterality: Right;  . lymp node removal Right 2011   arm   left arm 2015  . LYMPH NODE BIOPSY  08/2014   Family History  Problem Relation Age of Onset  . Kidney cancer Neg Hx        lung cancer  . Bladder Cancer Neg Hx   . Prostate cancer Neg Hx     Allergies: Latex Current Outpatient Medications on File Prior to Visit  Medication Sig Dispense Refill  . pravastatin (PRAVACHOL) 40 MG tablet Take 1 tablet (40 mg total) by mouth daily. 90 tablet 3  . FLUoxetine (PROZAC) 20 MG capsule Take 1 capsule (20 mg total) by mouth every morning. (Patient not taking: Reported on 02/03/2019) 90 capsule 3  . ibuprofen (ADVIL,MOTRIN) 200 MG tablet Take 200 mg by mouth every 6 (six) hours as needed.    . lipase/protease/amylase (CREON) 36000 UNITS CPEP capsule Take 1 capsule (36,000 Units total) by mouth 3 (three) times daily with meals. (Patient not taking: Reported on 10/10/2018) 180 capsule 3  . MYRBETRIQ 25 MG TB24 tablet Take 1 tablet (25 mg total) by mouth daily. (Patient not taking: Reported on 10/10/2018) 90 tablet 0  . sildenafil (VIAGRA) 50 MG tablet Take 1 tablet (50 mg total) by mouth daily as needed for erectile dysfunction. (Patient not taking:  Reported on 10/10/2018) 10 tablet 1   No current facility-administered medications on file prior to visit.     Social History   Tobacco Use  . Smoking status: Heavy Tobacco Smoker    Packs/day: 1.00    Years: 48.00    Pack years: 48.00    Types: Cigars  . Smokeless tobacco: Never Used  . Tobacco comment: quit cigarettes 30 years ago used to smoke 1 pk last 1 week no FH lung cancer   Substance Use Topics  . Alcohol use: No  . Drug use: No    Review of Systems  Constitutional: Negative for chills and  fever.  HENT: Positive for congestion and rhinorrhea. Negative for sinus pain and sore throat.   Respiratory: Positive for cough. Negative for shortness of breath and wheezing.   Cardiovascular: Positive for chest pain. Negative for palpitations.  Gastrointestinal: Negative for nausea and vomiting.      Objective:    BP (!) 92/58 (BP Location: Left Arm, Patient Position: Sitting, Cuff Size: Normal)   Pulse (!) 55   Temp 97.9 F (36.6 C)   Wt 131 lb 3.2 oz (59.5 kg)   SpO2 96%   BMI 19.95 kg/m  BP Readings from Last 3 Encounters:  02/03/19 (!) 92/58  10/10/18 114/64  07/06/18 108/67   Wt Readings from Last 3 Encounters:  02/03/19 131 lb 3.2 oz (59.5 kg)  10/10/18 128 lb 8 oz (58.3 kg)  09/22/18 130 lb (59 kg)    Physical Exam Vitals signs reviewed.  Constitutional:      Appearance: Normal appearance. He is well-developed.  HENT:     Head: Normocephalic and atraumatic.     Right Ear: Hearing, tympanic membrane, ear canal and external ear normal. No decreased hearing noted. No drainage, swelling or tenderness. No middle ear effusion. Tympanic membrane is not injected, erythematous or bulging.     Left Ear: Hearing, tympanic membrane, ear canal and external ear normal. No decreased hearing noted. No drainage, swelling or tenderness.  No middle ear effusion. Tympanic membrane is not injected, erythematous or bulging.     Nose: Nose normal.     Right Sinus: No maxillary sinus tenderness or frontal sinus tenderness.     Left Sinus: No maxillary sinus tenderness or frontal sinus tenderness.     Mouth/Throat:     Pharynx: Uvula midline. No oropharyngeal exudate or posterior oropharyngeal erythema.     Tonsils: No tonsillar abscesses.  Eyes:     Conjunctiva/sclera: Conjunctivae normal.  Cardiovascular:     Rate and Rhythm: Regular rhythm.     Heart sounds: Normal heart sounds.  Pulmonary:     Effort: Pulmonary effort is normal. No respiratory distress.     Breath sounds: Normal  breath sounds. No wheezing, rhonchi or rales.  Chest:     Chest wall: No tenderness.     Comments: No reproducible tenderness on exam. Abdominal:     General: Bowel sounds are normal. There is no distension.     Palpations: Abdomen is soft. Abdomen is not rigid. There is no fluid wave or mass.     Tenderness: There is no abdominal tenderness. There is no guarding or rebound. Negative signs include Murphy's sign and McBurney's sign.  Lymphadenopathy:     Head:     Right side of head: No submental, submandibular, tonsillar, preauricular, posterior auricular or occipital adenopathy.     Left side of head: No submental, submandibular, tonsillar, preauricular, posterior auricular or occipital adenopathy.  Cervical: No cervical adenopathy.  Skin:    General: Skin is warm and dry.  Neurological:     Mental Status: He is alert.  Psychiatric:        Speech: Speech normal.        Behavior: Behavior normal.        Assessment & Plan:   Problem List Items Addressed This Visit      Cardiovascular and Mediastinum   Coronary atherosclerosis    Advised to continue pravastatin.        Digestive   Gastroesophageal reflux disease    Advised trial of Prilosec.  Pending labs, right upper quadrant ultrasound.      Relevant Medications   omeprazole (PRILOSEC) 20 MG capsule   Other Relevant Orders   US ABDOMEN LIMITED RUQ   Lipase (Completed)   Comprehensive metabolic panel (Completed)     Other   Weight loss    Strongly encouraged patient to retrial Prozac.      Chest pressure    Concern based on histories of CAD, patient's age.  Occurs with coughing.  The onset does correlate with cough.  EKG today shows no significant changes from prior to 2019.  No acute ischemia. EKG reviewed with Dr Derrel Nip.  Consulted with Rockey Situ ( he was driving so unable to view EKG). Agreed patient does have risk factors, referral placed to his office.  Advised very close vigilance to patient, any recurrence of  symptom, or development of new symptoms, he understands to go directly to emergency room.  Information also include on his AVS.  Of note, paged Dr Josefa Half on call twice, no response 02/03/19.        Relevant Orders   EKG 12-Lead (Completed)   Magnesium (Completed)   Ambulatory referral to Cardiology   Cough - Primary    No acute respiratory distress.  Based on duration of symptoms, reasonable to start antibiotic.  SaO2 96%.  No adventitious lung sounds.  Pending chest x-ray.  Patient will let me know how he is doing.      Relevant Medications   amoxicillin-clavulanate (AUGMENTIN) 875-125 MG tablet   benzonatate (TESSALON) 100 MG capsule   Other Relevant Orders   DG Chest 2 View (Completed)       I have discontinued Tallin Hayashida's aspirin EC, traMADol, and famotidine. I am also having him start on omeprazole, amoxicillin-clavulanate, and benzonatate. Additionally, I am having him maintain his MYRBETRIQ, ibuprofen, lipase/protease/amylase, sildenafil, pravastatin, and FLUoxetine.   Meds ordered this encounter  Medications  . omeprazole (PRILOSEC) 20 MG capsule    Sig: Take 1 capsule (20 mg total) by mouth daily.    Dispense:  30 capsule    Refill:  3    Order Specific Question:   Supervising Provider    Answer:   Deborra Medina L [[id number]]  . amoxicillin-clavulanate (AUGMENTIN) 875-125 MG tablet    Sig: Take 1 tablet by mouth 2 (two) times daily.    Dispense:  14 tablet    Refill:  0    Order Specific Question:   Supervising Provider    Answer:   Deborra Medina L [[id number]]  . benzonatate (TESSALON) 100 MG capsule    Sig: Take 1 capsule (100 mg total) by mouth 2 (two) times daily as needed for cough.    Dispense:  20 capsule    Refill:  0    Order Specific Question:   Supervising Provider    Answer:   Deborra Medina L [[id number]]  Return precautions given.   Risks, benefits, and alternatives of the medications and treatment plan prescribed today were discussed, and patient  expressed understanding.   Education regarding symptom management and diagnosis given to patient on AVS.  Continue to follow with [name], FNP for routine health maintenance.   Earney Hamburg and I agreed with plan.   [name], FNP

## 2019-02-04 LAB — COMPREHENSIVE METABOLIC PANEL
AG Ratio: 1.6 (calc) (ref 1.0–2.5)
ALT: 8 U/L — AB (ref 9–46)
AST: 16 U/L (ref 10–35)
Albumin: 4.1 g/dL (ref 3.6–5.1)
Alkaline phosphatase (APISO): 46 U/L (ref 35–144)
BUN: 15 mg/dL (ref 7–25)
CO2: 25 mmol/L (ref 20–32)
CREATININE: 1.17 mg/dL (ref 0.70–1.25)
Calcium: 9.3 mg/dL (ref 8.6–10.3)
Chloride: 104 mmol/L (ref 98–110)
Globulin: 2.5 g/dL (calc) (ref 1.9–3.7)
Glucose, Bld: 91 mg/dL (ref 65–99)
Potassium: 4.3 mmol/L (ref 3.5–5.3)
Sodium: 138 mmol/L (ref 135–146)
Total Bilirubin: 0.5 mg/dL (ref 0.2–1.2)
Total Protein: 6.6 g/dL (ref 6.1–8.1)

## 2019-02-04 LAB — LIPASE: Lipase: 23 U/L (ref 7–60)

## 2019-02-04 LAB — MAGNESIUM: Magnesium: 2 mg/dL (ref 1.5–2.5)

## 2019-02-07 ENCOUNTER — Encounter (INDEPENDENT_AMBULATORY_CARE_PROVIDER_SITE_OTHER): Payer: Self-pay | Admitting: Vascular Surgery

## 2019-03-16 ENCOUNTER — Telehealth: Payer: Self-pay | Admitting: Family

## 2019-03-16 DIAGNOSIS — K219 Gastro-esophageal reflux disease without esophagitis: Secondary | ICD-10-CM

## 2019-03-16 NOTE — Telephone Encounter (Signed)
Patient called requesting some advise.  Was not specific.  Please contact.

## 2019-03-17 ENCOUNTER — Ambulatory Visit: Payer: BLUE CROSS/BLUE SHIELD | Admitting: Family

## 2019-03-17 ENCOUNTER — Telehealth: Payer: Self-pay | Admitting: Family

## 2019-03-17 MED ORDER — OMEPRAZOLE 20 MG PO CPDR: 20.0000 mg | DELAYED_RELEASE_CAPSULE | Freq: Every day | ORAL | 3 refills | Status: DC

## 2019-03-17 MED ORDER — FLUTICASONE PROPIONATE 50 MCG/ACT NA SUSP: 2.0000 | Freq: Every day | NASAL | 3 refills | Status: DC

## 2019-03-17 NOTE — Telephone Encounter (Signed)
Verbal consent for services obtained from patient prior to services given.  Names of all persons present for services: [name], NP  Chief complaint:   'scratchy sore throat' started today. Not sure if from change in weather. Productive cough improved after augmentin in 01/2018; then has come back. Cough is not constant. At times, post nasal drip. No wheezing, sob.  Smokes cigars. Had been on prilosec which helped cough. Never had RUQ [name]. Pain is still RUQ with meals. Unchanged.   Has right leg pain and numbness which extends which starts in right back pain and extends to foot at times. Started 3 days ago. Pain is an ache stays there , numbness comes and goes.  No falls. No LE swelling.  Worsens when turns to the left.  Pain improves when still.   No fever, abdominal pain, chills, fever.  Taking ibuprofen , elevating leg with relief.    Works at [employer]; has been    History, background, results pertinent:  GERD Smoker  A/P/next steps: Suspected viral pharyngitis -advised Flonase.  Conservative management at home.  Differentials cough include GERD, postnasal drip.  Start flonase Start prilosec  RUQ [name] scheduled - have messaged Melissa Right leg pain-nonspecific at this point.  Duration of 3 days.  Advised patient conservative management including NSAID is appropriate.  He will let me know if does not resolve on its own To stay home due to pandemic.  She will let us know if symptoms do not improve

## 2019-03-17 NOTE — Telephone Encounter (Signed)
Call pt  Check in   Doing question how is he doing? How is his leg pain?  has it resolved with conservative therapy at home? Ensure no new symptoms with leg such as swelling. If persists, we can consider gabapentin. He will need an actual OV if persists , likely imaging as well

## 2019-03-17 NOTE — Telephone Encounter (Signed)
I called patient & he stated that he has now been sent home from his job. He has cough & sore throat. Virtual is scheduled.

## 2019-03-20 NOTE — Telephone Encounter (Signed)
noted 

## 2019-05-03 ENCOUNTER — Telehealth: Payer: Self-pay | Admitting: Internal Medicine

## 2019-05-03 NOTE — Telephone Encounter (Signed)
Virtual Visit Pre-Appointment Phone Call  "(Name), I am calling you today to discuss your upcoming appointment. We are currently trying to limit exposure to the virus that causes COVID-19 by seeing patients at home rather than in the office."  1. "What is the BEST phone number to call the day of the visit?" - include this in appointment notes  2. Do you have or have access to (through a family member/friend) a smartphone with video capability that we can use for your visit?" a. If yes - list this number in appt notes as cell (if different from BEST phone #) and list the appointment type as a VIDEO visit in appointment notes b. If no - list the appointment type as a PHONE visit in appointment notes  3. Confirm consent - "In the setting of the current Covid19 crisis, you are scheduled for a (phone or video) visit with your provider on (date) at (time).  Just as we do with many in-office visits, in order for you to participate in this visit, we must obtain consent.  If you'd like, I can send this to your mychart (if signed up) or email for you to review.  Otherwise, I can obtain your verbal consent now.  All virtual visits are billed to your insurance company just like a normal visit would be.  By agreeing to a virtual visit, we'd like you to understand that the technology does not allow for your provider to perform an examination, and thus may limit your provider's ability to fully assess your condition. If your provider identifies any concerns that need to be evaluated in person, we will make arrangements to do so.  Finally, though the technology is pretty good, we cannot assure that it will always work on either your or our end, and in the setting of a video visit, we may have to convert it to a phone-only visit.  In either situation, we cannot ensure that we have a secure connection.  Are you willing to proceed?" STAFF: Did the patient verbally acknowledge consent to telehealth visit? Document  YES/NO here: YES  4. Advise patient to be prepared - "Two hours prior to your appointment, go ahead and check your blood pressure, pulse, oxygen saturation, and your weight (if you have the equipment to check those) and write them all down. When your visit starts, your provider will ask you for this information. If you have an Apple Watch or Kardia device, please plan to have heart rate information ready on the day of your appointment. Please have a pen and paper handy nearby the day of the visit as well."  5. Give patient instructions for MyChart download to smartphone OR Doximity/Doxy.me as below if video visit (depending on what platform provider is using)  6. Inform patient they will receive a phone call 15 minutes prior to their appointment time (may be from unknown caller ID) so they should be prepared to answer    TELEPHONE CALL NOTE  [name] has been deemed a candidate for a follow-up tele-health visit to limit community exposure during the Covid-19 pandemic. I spoke with the patient via phone to ensure availability of phone/video source, confirm preferred email & phone number, and discuss instructions and expectations.  I reminded [name] to be prepared with any vital sign and/or heart rhythm information that could potentially be obtained via home monitoring, at the time of his visit. I reminded [name] to expect a phone call prior to his visit.  [name] 05/03/2019 9:21 AM   INSTRUCTIONS FOR DOWNLOADING THE MYCHART APP TO SMARTPHONE  - The patient must first make sure to have activated MyChart and know their login information - If Apple, go to [employer] and type in MyChart in the search bar and download the app. If Android, ask patient to go to [employer] and type in [facility] in the search bar and download the app. The app is free but as with any other app downloads, their phone may require them to verify saved payment information or Apple/Android password.   - The patient will need to then log into the app with their MyChart username and password, and select [facility] as their healthcare provider to link the account. When it is time for your visit, go to the MyChart app, find appointments, and click Begin Video Visit. Be sure to Select Allow for your device to access the Microphone and Camera for your visit. You will then be connected, and your provider will be with you shortly.  **If they have any issues connecting, or need assistance please contact MyChart service desk [phone])**  **If using a computer, in order to ensure the best quality for their visit they will need to use either of the following Internet Browsers: [employer], or Google Chrome**  IF USING DOXIMITY or DOXY.ME - The patient will receive a link just prior to their visit by text.     FULL LENGTH CONSENT FOR TELE-HEALTH VISIT   I hereby voluntarily request, consent and authorize [facility] and its employed or contracted physicians, physician assistants, nurse practitioners or other licensed health care professionals (the Practitioner), to provide me with telemedicine health care services (the Services") as deemed necessary by the treating Practitioner. I acknowledge and consent to receive the Services by the Practitioner via telemedicine. I understand that the telemedicine visit will involve communicating with the Practitioner through live audiovisual communication technology and the disclosure of certain medical information by electronic transmission. I acknowledge that I have been given the opportunity to request an in-person assessment or other available alternative prior to the telemedicine visit and am voluntarily participating in the telemedicine visit.  I understand that I have the right to withhold or withdraw my consent to the use of telemedicine in the course of my care at any time, without affecting my right to future care or treatment, and that  the Practitioner or I may terminate the telemedicine visit at any time. I understand that I have the right to inspect all information obtained and/or recorded in the course of the telemedicine visit and may receive copies of available information for a reasonable fee.  I understand that some of the potential risks of receiving the Services via telemedicine include:   Delay or interruption in medical evaluation due to technological equipment failure or disruption;  Information transmitted may not be sufficient (e.g. poor resolution of images) to allow for appropriate medical decision making by the Practitioner; and/or   In rare instances, security protocols could fail, causing a breach of personal health information.  Furthermore, I acknowledge that it is my responsibility to provide information about my medical history, conditions and care that is complete and accurate to the best of my ability. I acknowledge that Practitioner's advice, recommendations, and/or decision may be based on factors not within their control, such as incomplete or inaccurate data provided by me or distortions of diagnostic images or specimens that may result from electronic transmissions. I understand that the practice  of medicine is not an Chief Strategy Officer and that Practitioner makes no warranties or guarantees regarding treatment outcomes. I acknowledge that I will receive a copy of this consent concurrently upon execution via email to the email address I last provided but may also request a printed copy by calling the office of [facility].    I understand that my insurance will be billed for this visit.   I have read or had this consent read to me.  I understand the contents of this consent, which adequately explains the benefits and risks of the Services being provided via telemedicine.   I have been provided ample opportunity to ask questions regarding this consent and the Services and have had my questions answered to  my satisfaction.  I give my informed consent for the services to be provided through the use of telemedicine in my medical care  By participating in this telemedicine visit I agree to the above.

## 2019-05-04 NOTE — Progress Notes (Signed)
No show

## 2019-05-05 ENCOUNTER — Encounter: Payer: Self-pay | Admitting: Internal Medicine

## 2019-05-05 ENCOUNTER — Other Ambulatory Visit: Payer: Self-pay

## 2019-05-05 ENCOUNTER — Telehealth: Payer: BLUE CROSS/BLUE SHIELD | Admitting: Internal Medicine

## 2019-05-23 ENCOUNTER — Telehealth: Payer: Self-pay | Admitting: Family

## 2019-05-23 NOTE — Telephone Encounter (Signed)
Pt is requesting a note to return back to work.

## 2019-05-24 ENCOUNTER — Telehealth: Payer: Self-pay | Admitting: *Deleted

## 2019-05-24 NOTE — Telephone Encounter (Signed)
Again, returned patient's call but unable to leave message.

## 2019-05-24 NOTE — Telephone Encounter (Signed)
Patient came into office & was provided return to work note.

## 2019-05-24 NOTE — Telephone Encounter (Signed)
Copied from [place] [id number]. Topic: General - Other >> May 23, 2019  3:15 PM Leward Quan A wrote: Reason for CRM: Patient called to say that he has returned to work but still need a note from [name] to return back to work. Please advise # Ph [phone]

## 2019-05-24 NOTE — Telephone Encounter (Signed)
I tried to call patient to find out why he needed this, since he's not been seen by [name] since February. VM was not set up. So I couldn't leave a message.

## 2019-08-02 ENCOUNTER — Telehealth: Payer: Self-pay | Admitting: Family

## 2019-08-02 NOTE — Telephone Encounter (Signed)
I spoke with patient & he was just concerned of COVID self testing done for [facility]. He is being sent a new test kit since his original was lost. Issue seemed to be resolved.   Patient did want to talk to you about a referral possible to [facility]? He wanted to make sure all his labs were normal & cancer was still in remission. He has a telephone appointment scheduled 08/09/19.

## 2019-08-02 NOTE — Telephone Encounter (Signed)
Patient called back for [name]. Ph# [phone]

## 2019-08-02 NOTE — Telephone Encounter (Signed)
Patient called and would like to talk to [facility] about issue with covid testing and to return back to work. Please call pt back, thanks.

## 2019-08-02 NOTE — Telephone Encounter (Signed)
I called patient, but was unable to leave message due to VM box not being set up.

## 2019-08-03 NOTE — Telephone Encounter (Signed)
Noted Pt has appt scheduled so we can address

## 2019-08-09 ENCOUNTER — Ambulatory Visit: Payer: BLUE CROSS/BLUE SHIELD | Admitting: Family

## 2019-08-09 DIAGNOSIS — Z0289 Encounter for other administrative examinations: Secondary | ICD-10-CM

## 2019-09-21 ENCOUNTER — Telehealth: Payer: Self-pay | Admitting: *Deleted

## 2019-09-21 NOTE — Telephone Encounter (Signed)
Attempted to contact regarding scheduling lung screening scan. However there is no answer or voicemail option.

## 2019-09-22 ENCOUNTER — Telehealth: Payer: Self-pay | Admitting: *Deleted

## 2019-09-22 DIAGNOSIS — Z87891 Personal history of nicotine dependence: Secondary | ICD-10-CM

## 2019-09-22 DIAGNOSIS — Z122 Encounter for screening for malignant neoplasm of respiratory organs: Secondary | ICD-10-CM

## 2019-09-22 NOTE — Telephone Encounter (Signed)
Attempted to contact to schedule lung screening scan. However, there is no answer or voicemail option.  °

## 2019-09-22 NOTE — Telephone Encounter (Signed)
Contacted patient and obtained smoking history,(current, 48.5 pack year) as well as answering questions related to screening process. Patient denies signs of lung cancer such as weight loss or hemoptysis. Patient denies comorbidity that would prevent curative treatment if lung cancer were found. Patient is scheduled for shared decision making visit and CT scan on 10/03/19 at 130pm.

## 2019-10-02 ENCOUNTER — Encounter: Payer: Self-pay | Admitting: Oncology

## 2019-10-03 ENCOUNTER — Inpatient Hospital Stay (HOSPITAL_BASED_OUTPATIENT_CLINIC_OR_DEPARTMENT_OTHER): Payer: BC Managed Care – PPO | Admitting: Oncology

## 2019-10-03 ENCOUNTER — Ambulatory Visit: Payer: BC Managed Care – PPO | Attending: Oncology

## 2019-10-03 ENCOUNTER — Other Ambulatory Visit: Payer: Self-pay

## 2019-10-03 DIAGNOSIS — Z122 Encounter for screening for malignant neoplasm of respiratory organs: Secondary | ICD-10-CM

## 2019-10-03 DIAGNOSIS — F17210 Nicotine dependence, cigarettes, uncomplicated: Secondary | ICD-10-CM

## 2019-10-03 NOTE — Progress Notes (Signed)
Virtual Visit via Video Note  I connected with [name] on 10/03/19 at  1:30 PM EDT by a video enabled telemedicine application and verified that I am speaking with the correct person using two identifiers.  Location: Patient: [facility] Provider: Office   I discussed the limitations of evaluation and management by telemedicine and the availability of in person appointments. The patient expressed understanding and agreed to proceed.  I discussed the assessment and treatment plan with the patient. The patient was provided an opportunity to ask questions and all were answered. The patient agreed with the plan and demonstrated an understanding of the instructions.   The patient was advised to call back or seek an in-person evaluation if the symptoms worsen or if the condition fails to improve as anticipated.   In accordance with CMS guidelines, patient has met eligibility criteria including age, absence of signs or symptoms of lung cancer.  Social History   Tobacco Use  . Smoking status: Current Every Day Smoker    Packs/day: 1.00    Years: 49.00    Pack years: 49.00    Types: Cigars  . Smokeless tobacco: Never Used  . Tobacco comment: .5 ppd last year, total of 48.5 pack year  Substance Use Topics  . Alcohol use: No  . Drug use: No      A shared decision-making session was conducted prior to the performance of CT scan. This includes one or more decision aids, includes benefits and harms of screening, follow-up diagnostic testing, over-diagnosis, false positive rate, and total radiation exposure.   Counseling on the importance of adherence to annual lung cancer LDCT screening, impact of co-morbidities, and ability or willingness to undergo diagnosis and treatment is imperative for compliance of the program.   Counseling on the importance of continued smoking cessation for former smokers; the importance of smoking cessation for current smokers, and information about tobacco cessation  interventions have been given to patient including [place] and [facility] [place] programs.   Written order for lung cancer screening with LDCT has been given to the patient and any and all questions have been answered to the best of my abilities.    Yearly follow up will be coordinated by Burgess Estelle, Thoracic Navigator.  I provided 15 minutes of face-to-face video visit time during this encounter, and > 50% was spent counseling as documented under my assessment & plan.   Jacquelin Hawking, NP

## 2019-10-11 DIAGNOSIS — C9110 Chronic lymphocytic leukemia of B-cell type not having achieved remission: Secondary | ICD-10-CM | POA: Diagnosis not present

## 2019-10-16 DIAGNOSIS — D649 Anemia, unspecified: Secondary | ICD-10-CM | POA: Diagnosis not present

## 2019-10-16 DIAGNOSIS — R000 Tachycardia, unspecified: Secondary | ICD-10-CM | POA: Diagnosis not present

## 2019-10-16 DIAGNOSIS — F418 Other specified anxiety disorders: Secondary | ICD-10-CM | POA: Diagnosis not present

## 2019-10-16 DIAGNOSIS — I739 Peripheral vascular disease, unspecified: Secondary | ICD-10-CM | POA: Diagnosis not present

## 2019-10-16 DIAGNOSIS — E785 Hyperlipidemia, unspecified: Secondary | ICD-10-CM | POA: Diagnosis not present

## 2019-10-16 DIAGNOSIS — C9110 Chronic lymphocytic leukemia of B-cell type not having achieved remission: Secondary | ICD-10-CM | POA: Diagnosis not present

## 2019-10-16 DIAGNOSIS — K219 Gastro-esophageal reflux disease without esophagitis: Secondary | ICD-10-CM | POA: Diagnosis not present

## 2019-10-16 LAB — IRON,TIBC AND FERRITIN PANEL
Ferritin: 204
Iron: 64

## 2019-10-16 LAB — BASIC METABOLIC PANEL
BUN: 14 (ref 4–21)
Creatinine: 1.1 (ref <=1.3)
Glucose: 81
Potassium: 3.9 (ref 3.4–5.3)
Sodium: 138 (ref 137–147)

## 2019-10-16 LAB — COMPREHENSIVE METABOLIC PANEL
Calcium: 9.1 (ref 8.7–10.7)
GFR calc Af Amer: 85

## 2019-10-16 LAB — CBC AND DIFFERENTIAL
HCT: 37 — AB (ref 41–53)
Hemoglobin: 11.8 — AB (ref 13.5–17.5)
Platelets: 199 (ref 150–399)

## 2019-10-16 LAB — VITAMIN B12: Vitamin B-12: 217

## 2019-10-23 DIAGNOSIS — R197 Diarrhea, unspecified: Secondary | ICD-10-CM | POA: Insufficient documentation

## 2019-10-23 DIAGNOSIS — M21621 Bunionette of right foot: Secondary | ICD-10-CM | POA: Diagnosis not present

## 2019-10-23 DIAGNOSIS — K8689 Other specified diseases of pancreas: Secondary | ICD-10-CM | POA: Insufficient documentation

## 2019-10-23 DIAGNOSIS — M2011 Hallux valgus (acquired), right foot: Secondary | ICD-10-CM | POA: Diagnosis not present

## 2019-10-23 DIAGNOSIS — M79672 Pain in left foot: Secondary | ICD-10-CM | POA: Diagnosis not present

## 2019-10-23 DIAGNOSIS — M21622 Bunionette of left foot: Secondary | ICD-10-CM | POA: Diagnosis not present

## 2019-10-23 DIAGNOSIS — M79671 Pain in right foot: Secondary | ICD-10-CM | POA: Diagnosis not present

## 2019-10-23 DIAGNOSIS — L84 Corns and callosities: Secondary | ICD-10-CM | POA: Diagnosis not present

## 2019-10-23 DIAGNOSIS — M2012 Hallux valgus (acquired), left foot: Secondary | ICD-10-CM | POA: Diagnosis not present

## 2019-11-03 ENCOUNTER — Telehealth: Payer: Self-pay | Admitting: *Deleted

## 2019-11-03 NOTE — Telephone Encounter (Signed)
Pt dropped off [name] records  placed in your folder to be delivered

## 2019-11-03 NOTE — Telephone Encounter (Signed)
Copied from [place] [id number]. Topic: General - Other >> Nov 03, 2019  3:58 PM Celene Kras A wrote: Reason for CRM: Pt called stating he received a letter from [facility] stating he is needing a b12 injection. Please advise.

## 2019-11-03 NOTE — Telephone Encounter (Signed)
I spoke with patient & he is dropping off labs from [facility] so we can verify he needs B-12 injection. Pt verified no COVID symptoms & will wear a mask.

## 2019-11-06 ENCOUNTER — Telehealth: Payer: Self-pay

## 2019-11-06 NOTE — Telephone Encounter (Signed)
Reviewed labs and hematology recommendation.  Ok to start vitamin B12 injections:  1032mcg q week x 4 weeks and then 1013mcg q month.  Labs need to be abstracted.  Thanks.

## 2019-11-06 NOTE — Telephone Encounter (Signed)
I spoke with patient & he stated that he had already been called to set up B-12 injections. He is set up for the first two weeks. I reiterated again that he will receive them once a week for 4 weeks then 1x monthly after that. Pt understood with no further questions.

## 2019-11-06 NOTE — Telephone Encounter (Signed)
I called patient to try to get him scheduled for B-12 injections per Dr. Nicki Reaper. Patient needs to be set up for B-12 1x weekly for 4 weeks then monthly. I was unable to leave message due to no VM being set up.

## 2019-11-06 NOTE — Telephone Encounter (Signed)
Labs abstracted and sent to scan. Patient has been set up for first two B-12 injections.

## 2019-11-06 NOTE — Telephone Encounter (Signed)
Placed for Dr. Nicki Reaper to view and give verbal for B 12 injections will schedule soon as verbal received,.

## 2019-11-08 ENCOUNTER — Other Ambulatory Visit: Payer: Self-pay

## 2019-11-08 ENCOUNTER — Ambulatory Visit (INDEPENDENT_AMBULATORY_CARE_PROVIDER_SITE_OTHER): Payer: BC Managed Care – PPO

## 2019-11-08 DIAGNOSIS — E538 Deficiency of other specified B group vitamins: Secondary | ICD-10-CM | POA: Diagnosis not present

## 2019-11-08 MED ORDER — CYANOCOBALAMIN 1000 MCG/ML IJ SOLN
1000.0000 ug | Freq: Once | INTRAMUSCULAR | Status: AC
  Administered 2019-11-08: 1000 ug via INTRAMUSCULAR

## 2019-11-08 NOTE — Progress Notes (Addendum)
[name] presents today for injection per MD orders. B12 injection  administered IM  in left Upper Arm. Administration without incident. Patient tolerated well.  [name],cma   Reviewed.  Dr Nicki Reaper

## 2019-11-15 ENCOUNTER — Other Ambulatory Visit: Payer: Self-pay

## 2019-11-15 ENCOUNTER — Ambulatory Visit (INDEPENDENT_AMBULATORY_CARE_PROVIDER_SITE_OTHER): Payer: BC Managed Care – PPO

## 2019-11-15 DIAGNOSIS — E538 Deficiency of other specified B group vitamins: Secondary | ICD-10-CM

## 2019-11-15 MED ORDER — CYANOCOBALAMIN 1000 MCG/ML IJ SOLN
1000.0000 ug | Freq: Once | INTRAMUSCULAR | Status: AC
  Administered 2019-11-15: 1000 ug via INTRAMUSCULAR

## 2019-11-15 NOTE — Progress Notes (Signed)
Patient presented today for B12 injection.  Administered IM in the right deltoid.  Patient tolerated well with no signs of distress.

## 2019-11-22 ENCOUNTER — Other Ambulatory Visit: Payer: Self-pay

## 2019-11-22 ENCOUNTER — Ambulatory Visit (INDEPENDENT_AMBULATORY_CARE_PROVIDER_SITE_OTHER): Payer: BC Managed Care – PPO

## 2019-11-22 ENCOUNTER — Telehealth: Payer: Self-pay

## 2019-11-22 DIAGNOSIS — E538 Deficiency of other specified B group vitamins: Secondary | ICD-10-CM | POA: Diagnosis not present

## 2019-11-22 MED ORDER — CYANOCOBALAMIN 1000 MCG/ML IJ SOLN
1000.0000 ug | Freq: Once | INTRAMUSCULAR | Status: AC
  Administered 2019-11-22: 1000 ug via INTRAMUSCULAR

## 2019-11-22 NOTE — Telephone Encounter (Signed)
Patient stated his right arm was swollen from his first B12 injection on 11/08/19.  Pt believes the swelling was due to previously having lymph nodes removed from his arm.  Pt had not made staff aware that lymph nodes had been removed before injection.  Pt said he wasn't aware that he shouldn't get injections in an arm that had lymph nodes removed.  Pt said that he missed the next two days of work from 11/09/19-11/10/19 because of swelling in his arm.  Pt said that his job is requesting a work note for those absences.  Pt is also requesting a work note to limit his duties at work due to his rheumatoid arthritis.  Pt said that his PCP [name] has wrote him a similar note for work in the past.  Pt also said that he had seen his podiatrist a few days ago about painful and swollen feet from bunions and hammer toes.  Pt said that he will be having surgery soon on his feet.   Pt said that it was hard to work because of his feet was still swollen and pt wasn't able to lace up his shoes.  Pt requested doctor's note to be taken out of work for this week.  Advised pt to contact his podiatrist for a work note concerning his feet.   Please advise.

## 2019-11-22 NOTE — Progress Notes (Signed)
Patient presented today for B12 injection.  Administered IM in the right deltoid.  Patient tolerated well with no signs of distress.

## 2019-11-23 ENCOUNTER — Encounter: Payer: Self-pay | Admitting: Podiatry

## 2019-11-23 ENCOUNTER — Ambulatory Visit (INDEPENDENT_AMBULATORY_CARE_PROVIDER_SITE_OTHER): Payer: BC Managed Care – PPO | Admitting: Internal Medicine

## 2019-11-23 ENCOUNTER — Ambulatory Visit (INDEPENDENT_AMBULATORY_CARE_PROVIDER_SITE_OTHER): Payer: BC Managed Care – PPO | Admitting: Podiatry

## 2019-11-23 DIAGNOSIS — L84 Corns and callosities: Secondary | ICD-10-CM | POA: Diagnosis not present

## 2019-11-23 DIAGNOSIS — M7989 Other specified soft tissue disorders: Secondary | ICD-10-CM

## 2019-11-23 DIAGNOSIS — M2010 Hallux valgus (acquired), unspecified foot: Secondary | ICD-10-CM

## 2019-11-23 NOTE — Progress Notes (Signed)
Patient came into office, on examination patient left arm was arm patient stated had swelling and ws painful on 11/08/19 after and njection of B 12, patient stated he did not advise anyone he had lymph nodes removed from that arm. Patient has band aid on right arm from injection 11/22/19 B 12.  Left arm today had no visual swelling, but patient stated he could feel some swelling under the arm area, arm was bilaterally equal in temperature and patient displayed full range of motion with no complaints of pain or discomfort in either arm . Patient stated left arm was not sore or painful now, was painful on 11/08/19 thru 11/10/19 and ask for work note for those days. Work note given.

## 2019-11-23 NOTE — Progress Notes (Signed)
This patient presents to the office with chief complaint of painful calluses on the bottom of both feet as well on the tip of the third toe right foot.  He says these areas are extremely painful walking wearing his shoes.  He says they become extremely painful and limit his activity while working.  He says these calluses have been present for years and are worsening the last few years.  He was seen by Dr. Amalia Hailey in 2018 for debridement of the lesions.  He presents the office today for an evaluation and treatment of his calluses.  He has a history of having vascular surgery with a diagnosis of claudication and PVD in his medical diagnoses.  He presents the office today for an evaluation and treatment of his painful callus.  Vascular  Dorsalis pedis and posterior tibial pulses are weakly  palpable  B/L.  Capillary return  WNL.  Temperature gradient is  WNL.  Skin turgor  WNL  Sensorium  Senn Weinstein monofilament wire  WNL. Normal tactile sensation.  Nail Exam  Patient has normal nails with no evidence of bacterial or fungal infection.  Orthopedic  Exam  Muscle tone and muscle strength  WNL.  No limitations of motion feet  B/L.  No crepitus or joint effusion noted.  Foot type is unremarkable and digits show no abnormalities.  HAV  B/L.  Skin  No open lesions.  Normal skin texture and turgor. Callus sub 3,5 right.  Pinch callus  B/L.  Distal clavi 3rd toe right foot.  Callus  B/L.  Debridement of callus both feet.  Discussed this condition with this patient.  Told him he would benefit from an appointment and evaluation with [facility] for special soft insoles to be worn in his work shoes.  He is to return to see Liliane Channel during his next visit and to return to the office to see me as needed.     Gardiner Barefoot DPM

## 2019-11-23 NOTE — Progress Notes (Signed)
Patient ID: [name], male   DOB: 1951/09/01, 68 y.o.   MRN: [id number]   Virtual Visit via telephone Note  This visit type was conducted due to national recommendations for restrictions regarding the COVID-19 pandemic (e.g. social distancing).  This format is felt to be most appropriate for this patient at this time.  All issues noted in this document were discussed and addressed.  No physical exam was performed (except for noted visual exam findings with Video Visits).   I connected with [name] by telephone and verified that I am speaking with the correct person using two identifiers. Location patient: home Location provider: work Persons participating in the telephone visit: patient, provider  I discussed the limitations, risks, security and privacy concerns of performing an evaluation and management service by telephone and the availability of in person appointments. The patient expressed understanding and agreed to proceed.   Reason for visit: scheduled follow up.    HPI: He reports he has one lymph node removed from his right arm.  Had a cluster of lymph nodes removed left axilla. Has noticed since he had removed, some intermittent swelling in his left arm.  States had injection (B12) 11/08/19.  Next day - noticed some swelling.  Reports stayed out of work for two days and wants a note for work.  He is now ok.  Never had increased redness or pain.  no increased warmth.  No redness or pain now.  No significant swelling now.       ROS: See pertinent positives and negatives per HPI.  Past Medical History:  Diagnosis Date  . Anemia 2008  . Arthritis   . Cervicalgia   . 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 Tasia Catchings  . Hyperlipidemia   . Low back pain   . Peripheral vascular disease [facility])     Past Surgical History:  Procedure Laterality Date  . COLONOSCOPY    . COLONOSCOPY WITH PROPOFOL N/A 07/23/2015   Procedure: COLONOSCOPY WITH PROPOFOL;  Surgeon:  [name], MD;  Location: [facility] ENDOSCOPY;  Service: Endoscopy;  Laterality: N/A;  . ESOPHAGOGASTRODUODENOSCOPY (EGD) WITH PROPOFOL N/A 08/20/2015   Procedure: ESOPHAGOGASTRODUODENOSCOPY (EGD) WITH PROPOFOL;  Surgeon: [name], MD;  Location: [facility] ENDOSCOPY;  Service: Endoscopy;  Laterality: N/A;  . LOWER EXTREMITY ANGIOGRAPHY Left 05/19/2018   Procedure: LOWER EXTREMITY ANGIOGRAPHY;  Surgeon: Algernon Huxley, MD;  Location: [place] CV LAB;  Service: Cardiovascular;  Laterality: Left;  . LOWER EXTREMITY ANGIOGRAPHY Right 06/15/2018   Procedure: LOWER EXTREMITY ANGIOGRAPHY;  Surgeon: Algernon Huxley, MD;  Location: [place] CV LAB;  Service: Cardiovascular;  Laterality: Right;  . lymp node removal Right 2011   arm   left arm 2015  . LYMPH NODE BIOPSY  08/2014    Family History  Problem Relation Age of Onset  . Kidney cancer Neg Hx        lung cancer  . Bladder Cancer Neg Hx   . Prostate cancer Neg Hx     SOCIAL HX: reviewed.    Current Outpatient Medications:  .  FLUoxetine (PROZAC) 20 MG capsule, Take 1 capsule (20 mg total) by mouth every morning., Disp: 90 capsule, Rfl: 3 .  fluticasone (FLONASE) 50 MCG/ACT nasal spray, Place 2 sprays into both nostrils daily., Disp: 16 g, Rfl: 3 .  ibuprofen (ADVIL,MOTRIN) 200 MG tablet, Take 200 mg by mouth every 6 (six) hours as needed., Disp: , Rfl:  .  lipase/protease/amylase (CREON) 36000 UNITS  CPEP capsule, Take 1 capsule (36,000 Units total) by mouth 3 (three) times daily with meals., Disp: 180 capsule, Rfl: 3 .  MYRBETRIQ 25 MG TB24 tablet, Take 1 tablet (25 mg total) by mouth daily., Disp: 90 tablet, Rfl: 0 .  omeprazole (PRILOSEC) 20 MG capsule, Take 1 capsule (20 mg total) by mouth daily., Disp: 30 capsule, Rfl: 3 .  pravastatin (PRAVACHOL) 40 MG tablet, Take 1 tablet (40 mg total) by mouth daily., Disp: 90 tablet, Rfl: 3 .  sildenafil (VIAGRA) 50 MG tablet, Take 1 tablet (50 mg total) by mouth daily as needed for  erectile dysfunction., Disp: 10 tablet, Rfl: 1  EXAM:  GENERAL: alert. Sounds to be in no acute distress.  Answering questions appropriately.    PSYCH/NEURO: pleasant and cooperative, no obvious depression or anxiety, speech and thought processing grossly intact  ASSESSMENT AND PLAN:  Discussed the following assessment and plan:  Left arm swelling Discussed with pt today.  No significant swelling or pain now.  Needs note for work.  Previous swelling as outlined.  Will come in for nurse evaluation - arm - to confirm no swelling or problems.  Note will be given for work at that time.  Pt agreeable.   Addendum.  See nurses note for exam.      I discussed the assessment and treatment plan with the patient. The patient was provided an opportunity to ask questions and all were answered. The patient agreed with the plan and demonstrated an understanding of the instructions.   The patient was advised to call back or seek an in-person evaluation if the symptoms worsen or if the condition fails to improve as anticipated.  I provided 13 minutes of non-face-to-face time during this encounter.   Einar Pheasant, MD

## 2019-11-23 NOTE — Telephone Encounter (Signed)
If having arm swelling, needs to be evaluated.  Can see about a work in today at 12:00.

## 2019-11-23 NOTE — Telephone Encounter (Signed)
Pt was evaluated today

## 2019-11-26 ENCOUNTER — Encounter: Payer: Self-pay | Admitting: Internal Medicine

## 2019-11-26 DIAGNOSIS — M7989 Other specified soft tissue disorders: Secondary | ICD-10-CM | POA: Insufficient documentation

## 2019-11-26 NOTE — Assessment & Plan Note (Signed)
Discussed with pt today.  No significant swelling or pain now.  Needs note for work.  Previous swelling as outlined.  Will come in for nurse evaluation - arm - to confirm no swelling or problems.  Note will be given for work at that time.  Pt agreeable.   Addendum.  See nurses note for exam.

## 2019-11-28 ENCOUNTER — Telehealth: Payer: Self-pay | Admitting: *Deleted

## 2019-11-28 NOTE — Telephone Encounter (Signed)
Copied from [place] [id number]. Topic: Quick Communication - See Telephone Encounter >> Nov 28, 2019  8:25 AM Loma [place] wrote: CRM for notification. See Telephone encounter for: 11/28/19. Pt is wanting to talk to [employer] nurse about steps for FMLA b/c of feet [phone]

## 2019-11-28 NOTE — Telephone Encounter (Signed)
Yes I certainly cannot do it bc I have not seen the patient he needs to ask podiatry first and if they refuse margarett will have to do it

## 2019-11-28 NOTE — Telephone Encounter (Signed)
Called patient he advised he sees [facility] for his feet so I advised patient he should call them and discuss the possibility of them completing FMLA for his feet since they have DX his problem , patient agreed to call [facility] and if they will not complete advised he would need an appt with PCP to complete FMLA.

## 2019-11-30 ENCOUNTER — Ambulatory Visit (INDEPENDENT_AMBULATORY_CARE_PROVIDER_SITE_OTHER): Payer: BC Managed Care – PPO | Admitting: *Deleted

## 2019-11-30 ENCOUNTER — Other Ambulatory Visit: Payer: Self-pay

## 2019-11-30 DIAGNOSIS — Z23 Encounter for immunization: Secondary | ICD-10-CM | POA: Diagnosis not present

## 2019-11-30 DIAGNOSIS — E538 Deficiency of other specified B group vitamins: Secondary | ICD-10-CM

## 2019-12-01 MED ORDER — CYANOCOBALAMIN 1000 MCG/ML IJ SOLN
1000.0000 ug | Freq: Once | INTRAMUSCULAR | Status: AC
  Administered 2019-11-30: 14:00:00 1000 ug via INTRAMUSCULAR

## 2019-12-01 NOTE — Progress Notes (Addendum)
Patient presented for B 12 injection to right deltoid, patient voiced no concerns nor showed any signs of distress during injection. Late documentation received injection on 11/30/19.  Reviewed.  Dr Nicki Reaper

## 2019-12-06 ENCOUNTER — Ambulatory Visit (INDEPENDENT_AMBULATORY_CARE_PROVIDER_SITE_OTHER): Payer: BC Managed Care – PPO | Admitting: Orthotics

## 2019-12-06 ENCOUNTER — Other Ambulatory Visit: Payer: Self-pay

## 2019-12-06 DIAGNOSIS — D492 Neoplasm of unspecified behavior of bone, soft tissue, and skin: Secondary | ICD-10-CM

## 2019-12-06 DIAGNOSIS — L84 Corns and callosities: Secondary | ICD-10-CM

## 2019-12-06 NOTE — Progress Notes (Signed)
Patient was seen today for offloading painful plantar fibromas/keratomas.  Area of concerned was marked and patient was scanned/cast to offload the keratoma/fibroma.  A LW accomodative device will be fabricated for the patient with appropriate offloads.  

## 2019-12-26 ENCOUNTER — Ambulatory Visit: Payer: BC Managed Care – PPO

## 2020-01-04 ENCOUNTER — Other Ambulatory Visit: Payer: Self-pay

## 2020-01-09 ENCOUNTER — Ambulatory Visit (INDEPENDENT_AMBULATORY_CARE_PROVIDER_SITE_OTHER): Payer: Self-pay

## 2020-01-09 ENCOUNTER — Other Ambulatory Visit: Payer: Self-pay

## 2020-01-09 DIAGNOSIS — E538 Deficiency of other specified B group vitamins: Secondary | ICD-10-CM

## 2020-01-09 MED ORDER — CYANOCOBALAMIN 1000 MCG/ML IJ SOLN
1000.0000 ug | Freq: Once | INTRAMUSCULAR | Status: AC
  Administered 2020-01-09: 11:00:00 1000 ug via INTRAMUSCULAR

## 2020-01-09 NOTE — Progress Notes (Addendum)
Patient came in today for B-12 injection in right deltoid IM. Stated that he cannot have done in left due to removal of lymph nodes & having pain from this in that arm. Patient tolerated well.   Reviewed.  Dr Nicki Reaper

## 2020-01-10 ENCOUNTER — Other Ambulatory Visit: Payer: BC Managed Care – PPO | Admitting: Orthotics

## 2020-02-29 ENCOUNTER — Ambulatory Visit (INDEPENDENT_AMBULATORY_CARE_PROVIDER_SITE_OTHER): Payer: Self-pay

## 2020-02-29 ENCOUNTER — Other Ambulatory Visit: Payer: Self-pay

## 2020-02-29 DIAGNOSIS — E538 Deficiency of other specified B group vitamins: Secondary | ICD-10-CM

## 2020-02-29 MED ORDER — CYANOCOBALAMIN 1000 MCG/ML IJ SOLN
1000.0000 ug | Freq: Once | INTRAMUSCULAR | Status: AC
  Administered 2020-02-29: 1000 ug via INTRAMUSCULAR

## 2020-02-29 NOTE — Progress Notes (Signed)
Patient came in today for B-12 Injection in right deltoid IM. Patient tolerated well.   Patient also stated when he came in that he had a rash on his arm. He said that this rash has been coming & going for months. It was at one point very noticeable around his stomach/waist area. That had since disappeared. He thought maybe this was coming from some treatments he had been receiving from [facility] for his CLL (chronic lymphocytic leukemia). Patient showed me his arm & I could see area he was talking about, but to me it looked more like a dark discoloration of his skin. He stated that it was no longer itchy. The rash just comes & goes with no pattern.  Patient wanted to see Joycelyn Schmid, but unfortunately she was booked out. Pt was agreeable to going to UC across the road just to be evaluated.

## 2020-03-12 ENCOUNTER — Telehealth: Payer: Self-pay

## 2020-03-12 NOTE — Telephone Encounter (Signed)
Patient has been notified that the low dose lung cancer screening CT scan is due currently or will be in near future.  Confirmed that patient is within the appropriate age range and asymptomatic, (no signs or symptoms of lung cancer).  Patient denies illness that would prevent curative treatment for lung cancer if found.  Verified smoking history (current cigar smoker with 49 year history).  Patient is agreeable for CT scan being scheduled.  No appointment time preference.

## 2020-03-22 NOTE — Telephone Encounter (Signed)
Have attempted to schedule ct but there is no answer or voicemail option

## 2020-04-03 ENCOUNTER — Other Ambulatory Visit: Payer: Self-pay

## 2020-04-03 ENCOUNTER — Telehealth: Payer: Self-pay | Admitting: Family

## 2020-04-03 ENCOUNTER — Ambulatory Visit (INDEPENDENT_AMBULATORY_CARE_PROVIDER_SITE_OTHER): Payer: Self-pay

## 2020-04-03 DIAGNOSIS — E538 Deficiency of other specified B group vitamins: Secondary | ICD-10-CM

## 2020-04-03 MED ORDER — CYANOCOBALAMIN 1000 MCG/ML IJ SOLN
1000.0000 ug | Freq: Once | INTRAMUSCULAR | Status: AC
  Administered 2020-04-03: 11:00:00 1000 ug via INTRAMUSCULAR

## 2020-04-03 NOTE — Progress Notes (Addendum)
Patient presented for B 12 injection to left deltoid, patient voiced no concerns nor showed any signs of distress during injection. Called for f/u with me and labs. Agree with plan. [name], NP

## 2020-04-03 NOTE — Telephone Encounter (Signed)
Call pt sch f/u with me for b12 injectons Need to check b12 and also intrinsic factor lab

## 2020-04-03 NOTE — Telephone Encounter (Signed)
Patient scheduled for f/u 04/17/20.

## 2020-04-16 ENCOUNTER — Encounter: Payer: Self-pay | Admitting: *Deleted

## 2020-04-16 ENCOUNTER — Telehealth: Payer: Self-pay | Admitting: *Deleted

## 2020-04-16 NOTE — Telephone Encounter (Signed)
Attempted multiple times to contact and schedule lung screening scan that is due. Unable to contact patient or leave voicemail. Mailed letter in attempt to contact patient.

## 2020-04-17 ENCOUNTER — Ambulatory Visit (INDEPENDENT_AMBULATORY_CARE_PROVIDER_SITE_OTHER): Payer: Self-pay | Admitting: Family

## 2020-04-17 ENCOUNTER — Encounter: Payer: Self-pay | Admitting: Family

## 2020-04-17 ENCOUNTER — Other Ambulatory Visit: Payer: Self-pay

## 2020-04-17 VITALS — BP 130/76 | HR 62 | Temp 97.1°F | Ht 67.95 in | Wt 130.2 lb

## 2020-04-17 DIAGNOSIS — R634 Abnormal weight loss: Secondary | ICD-10-CM

## 2020-04-17 DIAGNOSIS — F339 Major depressive disorder, recurrent, unspecified: Secondary | ICD-10-CM

## 2020-04-17 DIAGNOSIS — I2510 Atherosclerotic heart disease of native coronary artery without angina pectoris: Secondary | ICD-10-CM

## 2020-04-17 DIAGNOSIS — K219 Gastro-esophageal reflux disease without esophagitis: Secondary | ICD-10-CM

## 2020-04-17 DIAGNOSIS — D509 Iron deficiency anemia, unspecified: Secondary | ICD-10-CM

## 2020-04-17 LAB — IBC + FERRITIN
Ferritin: 230.7 ng/mL (ref 22.0–322.0)
Iron: 78 ug/dL (ref 42–165)
Saturation Ratios: 31.1 % (ref 20.0–50.0)
Transferrin: 179 mg/dL — ABNORMAL LOW (ref 212.0–360.0)

## 2020-04-17 LAB — B12 AND FOLATE PANEL
Folate: 8.8 ng/mL (ref >5.9)
Vitamin B-12: 672 pg/mL (ref 211–911)

## 2020-04-17 MED ORDER — PRAVASTATIN SODIUM 40 MG PO TABS: 40.0000 mg | ORAL_TABLET | Freq: Every day | ORAL | 3 refills | Status: DC

## 2020-04-17 MED ORDER — FAMOTIDINE 20 MG PO TABS: 20.0000 mg | ORAL_TABLET | Freq: Two times a day (BID) | ORAL | 1 refills | Status: DC

## 2020-04-17 NOTE — Progress Notes (Signed)
Subjective:    Patient ID: [name], male    DOB: 1951-03-18, 69 y.o.   MRN: [id number]  CC: [name] is a 69 y.o. male who presents today for follow up.   HPI: Had been doing great last year, weight up to 140lbs and had increased energy, then suddenly let go from [employer] a couple of days prior Christmas. 'that shook him'. He has had legal consult in regards to this.  Since then has more depressive symptoms however declines medication at this time. Strength comes from faith and taking care of grandchild. Recently went to beach with family and very therapeutic for him.  No si/hi.  Lifts weights every day. No cp.  No longer seeing vascular as leg pain as resolved.    Weight loss of 10 pounds since lost job.  gerd- daily 'indigestion' with burning with bitter taste, burping. Improved with dietary changes. Has found that orange juice is a trigger.Takes pepto bismal relief. Prilosec had been helpful, would like to restart prilosec.    Atherosclerosis-he has stopped taking Pravachol  b12 deficiency Lung cancer screening-due  CLL-[facility] NP 09/2019;continued surveillance for CLL. Plans to continue regular follow up, scheduled 04/2020.  referral to GI.  Beta thalassemia minor HISTORY:  Past Medical History:  Diagnosis Date  . Anemia 2008  . Arthritis   . Cervicalgia   . Chronic lymphocytic leukemia (CLL), B-cell ([place]) 06/15/2015   Dr. Tasia Catchings  . Hyperlipidemia   . Low back pain   . Peripheral vascular disease [facility])    Past Surgical History:  Procedure Laterality Date  . COLONOSCOPY    . COLONOSCOPY WITH PROPOFOL N/A 07/23/2015   Procedure: COLONOSCOPY WITH PROPOFOL;  Surgeon: [name], MD;  Location: [facility] ENDOSCOPY;  Service: Endoscopy;  Laterality: N/A;  . ESOPHAGOGASTRODUODENOSCOPY (EGD) WITH PROPOFOL N/A 08/20/2015   Procedure: ESOPHAGOGASTRODUODENOSCOPY (EGD) WITH PROPOFOL;  Surgeon: [name], MD;  Location: [facility] ENDOSCOPY;  Service: Endoscopy;   Laterality: N/A;  . LOWER EXTREMITY ANGIOGRAPHY Left 05/19/2018   Procedure: LOWER EXTREMITY ANGIOGRAPHY;  Surgeon: Algernon Huxley, MD;  Location: [place] CV LAB;  Service: Cardiovascular;  Laterality: Left;  . LOWER EXTREMITY ANGIOGRAPHY Right 06/15/2018   Procedure: LOWER EXTREMITY ANGIOGRAPHY;  Surgeon: Algernon Huxley, MD;  Location: [place] CV LAB;  Service: Cardiovascular;  Laterality: Right;  . lymp node removal Right 2011   arm   left arm 2015  . LYMPH NODE BIOPSY  08/2014   Family History  Problem Relation Age of Onset  . Kidney cancer Neg Hx        lung cancer  . Bladder Cancer Neg Hx   . Prostate cancer Neg Hx     Allergies: Latex Current Outpatient Medications on File Prior to Visit  Medication Sig Dispense Refill  . ibuprofen (ADVIL,MOTRIN) 200 MG tablet Take 200 mg by mouth every 6 (six) hours as needed.     No current facility-administered medications on file prior to visit.    Social History   Tobacco Use  . Smoking status: Current Every Day Smoker    Packs/day: 1.00    Years: 49.00    Pack years: 49.00    Types: Cigars  . Smokeless tobacco: Never Used  . Tobacco comment: .5 ppd last year, total of 48.5 pack year  Substance Use Topics  . Alcohol use: No  . Drug use: No    Review of Systems  Constitutional: Negative for chills and fever.  Respiratory: Negative for cough.  Cardiovascular: Negative for chest pain and palpitations.  Gastrointestinal: Negative for abdominal pain, blood in stool, diarrhea, nausea and vomiting.      Objective:    BP 130/76   Pulse 62   Temp (!) 97.1 F (36.2 C)   Ht 5' 7.95" (1.726 m)   Wt 130 lb 3.2 oz (59.1 kg)   SpO2 99%   BMI 19.83 kg/m  BP Readings from Last 3 Encounters:  04/17/20 130/76  02/03/19 (!) 92/58  10/10/18 114/64   Wt Readings from Last 3 Encounters:  04/17/20 130 lb 3.2 oz (59.1 kg)  02/03/19 131 lb 3.2 oz (59.5 kg)  10/10/18 128 lb 8 oz (58.3 kg)    Physical Exam Vitals reviewed.    Constitutional:      Appearance: He is well-developed.  Cardiovascular:     Rate and Rhythm: Regular rhythm.     Heart sounds: Normal heart sounds.  Pulmonary:     Effort: Pulmonary effort is normal. No respiratory distress.     Breath sounds: Normal breath sounds. No wheezing, rhonchi or rales.  Skin:    General: Skin is warm and dry.  Neurological:     Mental Status: He is alert.  Psychiatric:        Speech: Speech normal.        Behavior: Behavior normal.        Assessment & Plan:   Problem List Items Addressed This Visit      Cardiovascular and Mediastinum   Coronary atherosclerosis    Discussed my recommendation after seeing atherosclerosis on CT scan to restart pravachol.  Patient was agreement of this.  We will do labs in 6 weeks.      Relevant Medications   pravastatin (PRAVACHOL) 40 MG tablet     Digestive   Gastroesophageal reflux disease    Improved somewhat with dietary changes.  Advised that Pepcid AC is a safer profile than Prilosec.  Patient will try Pepcid AC.  Referral to GI for evaluation of GERD, colonoscopy.      Relevant Medications   famotidine (PEPCID) 20 MG tablet   Other Relevant Orders   Ambulatory referral to Gastroenterology     Other   Depression, recurrent ([place])    Again, discussed restarting Prozac with patient today.  He politely declines.  He derives happiness from his family, grandchildren and feels like he has good coping mechanisms.  Certainly advised to let me know if this were to change.  Close follow-up.      Microcytic anemia - Primary   Relevant Orders   Fecal occult blood, imunochemical   B12 and Folate Panel (Completed)   Intrinsic Factor Antibodies   Ambulatory referral to Gastroenterology   CBC with Differential/Platelet (Completed)   IBC + Ferritin (Completed)   Weight loss    Weight loss in the setting of depression after job. Appears to have stabilized.  We agreed to follow. F/u 3 months.          I have  discontinued [employer], lipase/protease/amylase, sildenafil, FLUoxetine, omeprazole, and fluticasone. I am also having him start on famotidine. Additionally, I am having him maintain his ibuprofen and pravastatin.   Meds ordered this encounter  Medications  . famotidine (PEPCID) 20 MG tablet    Sig: Take 1 tablet (20 mg total) by mouth 2 (two) times daily.    Dispense:  120 tablet    Refill:  1    Order Specific Question:   Supervising Provider  Answer:   TULLO, TERESA L [[id number]]  . pravastatin (PRAVACHOL) 40 MG tablet    Sig: Take 1 tablet (40 mg total) by mouth daily.    Dispense:  90 tablet    Refill:  3    Generic ok    Order Specific Question:   Supervising Provider    Answer:   Crecencio Mc [[id number]]    Return precautions given.   Risks, benefits, and alternatives of the medications and treatment plan prescribed today were discussed, and patient expressed understanding.   Education regarding symptom management and diagnosis given to patient on AVS.  Continue to follow with [name], FNP for routine health maintenance.   Earney Hamburg and I agreed with plan.   [name], FNP

## 2020-04-17 NOTE — Assessment & Plan Note (Addendum)
Weight loss in the setting of depression after job. Appears to have stabilized.  We agreed to follow. F/u 3 months.

## 2020-04-17 NOTE — Assessment & Plan Note (Signed)
Improved somewhat with dietary changes.  Advised that Pepcid AC is a safer profile than Prilosec.  Patient will try Pepcid AC.  Referral to GI for evaluation of GERD, colonoscopy.

## 2020-04-17 NOTE — Patient Instructions (Addendum)
Call to make an appointment for Annual Lung cancer screen  With CT Chest: [phone]. Let me know if any issues in doing so.  Return stool cards  Referral for colonoscopy/ GI for acid reflux and anemia.  Let us know if you dont hear back within a week in regards to an appointment being scheduled.

## 2020-04-17 NOTE — Assessment & Plan Note (Signed)
Discussed my recommendation after seeing atherosclerosis on CT scan to restart pravachol.  Patient was agreement of this.  We will do labs in 6 weeks.

## 2020-04-17 NOTE — Assessment & Plan Note (Signed)
Again, discussed restarting Prozac with patient today.  He politely declines.  He derives happiness from his family, grandchildren and feels like he has good coping mechanisms.  Certainly advised to let me know if this were to change.  Close follow-up.

## 2020-04-17 NOTE — Telephone Encounter (Signed)
noted 

## 2020-04-18 ENCOUNTER — Telehealth: Payer: Self-pay | Admitting: *Deleted

## 2020-04-18 LAB — CBC WITH DIFFERENTIAL/PLATELET
Basophils Absolute: 0.2 10*3/uL — ABNORMAL HIGH (ref 0.0–0.1)
Basophils Relative: 0.7 % (ref 0.0–3.0)
Eosinophils Absolute: 0.2 10*3/uL (ref 0.0–0.7)
Eosinophils Relative: 1 % (ref 0.0–5.0)
HCT: 37.5 % — ABNORMAL LOW (ref 39.0–52.0)
Hemoglobin: 11.8 g/dL — ABNORMAL LOW (ref 13.0–17.0)
Lymphocytes Relative: 78.6 % — ABNORMAL HIGH (ref 12.0–46.0)
Lymphs Abs: 19 10*3/uL — ABNORMAL HIGH (ref 0.7–4.0)
MCHC: 31.5 g/dL (ref 30.0–36.0)
MCV: 77.1 fl — ABNORMAL LOW (ref 78.0–100.0)
Monocytes Absolute: 0.6 10*3/uL (ref 0.1–1.0)
Monocytes Relative: 2.4 % — ABNORMAL LOW (ref 3.0–12.0)
Neutro Abs: 4.2 10*3/uL (ref 1.4–7.7)
Neutrophils Relative %: 17.3 % — ABNORMAL LOW (ref 43.0–77.0)
Platelets: 234 10*3/uL (ref 150.0–400.0)
RBC: 4.87 Mil/uL (ref 4.22–5.81)
RDW: 15.8 % — ABNORMAL HIGH (ref 11.5–15.5)
WBC: 24.2 10*3/uL (ref 4.0–10.5)

## 2020-04-18 NOTE — Telephone Encounter (Signed)
I tried to reach patient to advise him to make a f/u with Dr.Yu. I was unable to reach patient & VM was not yet set up.

## 2020-04-18 NOTE — Telephone Encounter (Signed)
Patient has hx of lymphocytic leukemia & we received critical lab for WBC yesterday. WBC were 24.2, but one year ago were at 21.4.

## 2020-04-18 NOTE — Telephone Encounter (Signed)
Patient has CLL and his WBC, although elevated,   is relatively stable,  But he needs to resume follow up with Dr Tasia Catchings  As advised by [facility]

## 2020-04-18 NOTE — Telephone Encounter (Signed)
CRITICAL VALUE STICKER  CRITICAL VALUE: [id number]  RECEIVER (on-site recipient of call): Julie-Ann Vanmaanen   DATE & TIME NOTIFIED: 4/29 @ 8:30am  MESSENGER (representative from lab): Santiago Glad  MD NOTIFIED: Dr. Derrel Nip in absence of [place]: 8:40am  RESPONSE: see result note or phone note

## 2020-04-19 LAB — INTRINSIC FACTOR ANTIBODIES: Intrinsic Factor: NEGATIVE

## 2020-04-22 ENCOUNTER — Telehealth: Payer: Self-pay

## 2020-04-22 ENCOUNTER — Other Ambulatory Visit: Payer: Self-pay

## 2020-04-22 DIAGNOSIS — C9110 Chronic lymphocytic leukemia of B-cell type not having achieved remission: Secondary | ICD-10-CM

## 2020-04-22 NOTE — Telephone Encounter (Signed)
Patient is aware of results & already has appointment scheduled with Dr. Tasia Catchings.

## 2020-04-22 NOTE — Telephone Encounter (Signed)
-----   Message from Earlie Server, MD sent at 04/22/2020  8:34 AM EDT ----- This patient has lost follow up please schedule him to have a follow up.  Lab -cbc cmp -ldh, please order. MD thank you

## 2020-04-22 NOTE — Telephone Encounter (Signed)
Done..  Pt has been scheduled as requested.. Pt is aware of his appt date per pt request to RTC on 04/25/20

## 2020-04-25 ENCOUNTER — Other Ambulatory Visit: Payer: Self-pay

## 2020-04-25 ENCOUNTER — Inpatient Hospital Stay: Payer: Self-pay | Attending: Oncology

## 2020-04-25 ENCOUNTER — Inpatient Hospital Stay: Payer: Self-pay | Admitting: Oncology

## 2020-04-25 ENCOUNTER — Telehealth: Payer: Self-pay | Admitting: *Deleted

## 2020-04-25 DIAGNOSIS — Z8042 Family history of malignant neoplasm of prostate: Secondary | ICD-10-CM | POA: Insufficient documentation

## 2020-04-25 DIAGNOSIS — Z801 Family history of malignant neoplasm of trachea, bronchus and lung: Secondary | ICD-10-CM | POA: Insufficient documentation

## 2020-04-25 DIAGNOSIS — C9110 Chronic lymphocytic leukemia of B-cell type not having achieved remission: Secondary | ICD-10-CM | POA: Insufficient documentation

## 2020-04-25 DIAGNOSIS — D563 Thalassemia minor: Secondary | ICD-10-CM | POA: Insufficient documentation

## 2020-04-25 DIAGNOSIS — Z8052 Family history of malignant neoplasm of bladder: Secondary | ICD-10-CM | POA: Insufficient documentation

## 2020-04-25 DIAGNOSIS — D589 Hereditary hemolytic anemia, unspecified: Secondary | ICD-10-CM | POA: Insufficient documentation

## 2020-04-25 DIAGNOSIS — F17290 Nicotine dependence, other tobacco product, uncomplicated: Secondary | ICD-10-CM | POA: Insufficient documentation

## 2020-04-25 DIAGNOSIS — Z8051 Family history of malignant neoplasm of kidney: Secondary | ICD-10-CM | POA: Insufficient documentation

## 2020-04-25 LAB — CBC WITH DIFFERENTIAL/PLATELET
Abs Immature Granulocytes: 0.07 10*3/uL (ref 0.00–0.07)
Basophils Absolute: 0.1 10*3/uL (ref 0.0–0.1)
Basophils Relative: 0 %
Eosinophils Absolute: 0.2 10*3/uL (ref 0.0–0.5)
Eosinophils Relative: 1 %
HCT: 36.2 % — ABNORMAL LOW (ref 39.0–52.0)
Hemoglobin: 11.6 g/dL — ABNORMAL LOW (ref 13.0–17.0)
Immature Granulocytes: 0 %
Lymphocytes Relative: 81 %
Lymphs Abs: 20.9 10*3/uL — ABNORMAL HIGH (ref 0.7–4.0)
MCH: 24.6 pg — ABNORMAL LOW (ref 26.0–34.0)
MCHC: 32 g/dL (ref 30.0–36.0)
MCV: 76.7 fL — ABNORMAL LOW (ref 80.0–100.0)
Monocytes Absolute: 0.8 10*3/uL (ref 0.1–1.0)
Monocytes Relative: 3 %
Neutro Abs: 3.7 10*3/uL (ref 1.7–7.7)
Neutrophils Relative %: 15 %
Platelets: 218 10*3/uL (ref 150–400)
RBC: 4.72 MIL/uL (ref 4.22–5.81)
RDW: 16.5 % — ABNORMAL HIGH (ref 11.5–15.5)
Smear Review: NORMAL
WBC Morphology: ABNORMAL
WBC: 25.8 10*3/uL — ABNORMAL HIGH (ref 4.0–10.5)
nRBC: 0 % (ref 0.0–0.2)

## 2020-04-25 LAB — COMPREHENSIVE METABOLIC PANEL WITH GFR
ALT: 13 U/L (ref 0–44)
AST: 21 U/L (ref 15–41)
Albumin: 4.1 g/dL (ref 3.5–5.0)
Alkaline Phosphatase: 42 U/L (ref 38–126)
Anion gap: 8 (ref 5–15)
BUN: 16 mg/dL (ref 8–23)
CO2: 25 mmol/L (ref 22–32)
Calcium: 9.2 mg/dL (ref 8.9–10.3)
Chloride: 107 mmol/L (ref 98–111)
Creatinine, Ser: 1.11 mg/dL (ref 0.61–1.24)
GFR calc Af Amer: >60 mL/min
GFR calc non Af Amer: >60 mL/min
Glucose, Bld: 88 mg/dL (ref 70–99)
Potassium: 3.9 mmol/L (ref 3.5–5.1)
Sodium: 140 mmol/L (ref 135–145)
Total Bilirubin: 1.1 mg/dL (ref 0.3–1.2)
Total Protein: 6.8 g/dL (ref 6.5–8.1)

## 2020-04-25 LAB — LACTATE DEHYDROGENASE: LDH: 112 U/L (ref 98–192)

## 2020-04-25 NOTE — Telephone Encounter (Signed)
Called pt x3 to get his 04/25/20 MD appt R/S I was unable to reach him by phone and was unable to left a detailed message due to him not having his v.mailbox set up. Appt was R/S 1 week out and a appt reminder letter will be mailed out also.

## 2020-04-30 ENCOUNTER — Other Ambulatory Visit: Payer: Self-pay

## 2020-05-01 ENCOUNTER — Inpatient Hospital Stay: Payer: Self-pay | Admitting: Oncology

## 2020-05-02 ENCOUNTER — Other Ambulatory Visit: Payer: Self-pay

## 2020-05-02 ENCOUNTER — Ambulatory Visit (INDEPENDENT_AMBULATORY_CARE_PROVIDER_SITE_OTHER): Payer: Self-pay | Admitting: Gastroenterology

## 2020-05-02 ENCOUNTER — Encounter: Payer: Self-pay | Admitting: Gastroenterology

## 2020-05-02 ENCOUNTER — Inpatient Hospital Stay: Payer: Self-pay | Admitting: Oncology

## 2020-05-02 VITALS — BP 100/56 | HR 56 | Temp 98.2°F | Ht 68.0 in | Wt 132.8 lb

## 2020-05-02 DIAGNOSIS — K5900 Constipation, unspecified: Secondary | ICD-10-CM

## 2020-05-02 DIAGNOSIS — D509 Iron deficiency anemia, unspecified: Secondary | ICD-10-CM

## 2020-05-02 NOTE — Patient Instructions (Signed)
Please take Miralax daily.  Polyethylene Glycol powder What is this medicine? POLYETHYLENE GLYCOL 3350 (pol ee ETH i leen; GLYE col) powder is a laxative used to treat constipation. It increases the amount of water in the stool. Bowel movements become easier and more frequent. This medicine may be used for other purposes; ask your health care provider or pharmacist if you have questions. COMMON BRAND NAME(S): Sharlyn Bologna, GlycoLax, Healthylax, MiraLax, Smooth LAX, [employer] What should I tell my health care provider before I take this medicine? They need to know if you have any of these conditions:  a history of blockage of the stomach or intestine  current abdomen distension or pain  difficulty swallowing  diverticulitis, ulcerative colitis, or other chronic bowel disease  phenylketonuria  an unusual or allergic reaction to polyethylene glycol, other medicines, dyes, or preservatives  pregnant or trying to get pregnant  breast-feeding How should I use this medicine? Take this medicine by mouth. The bottle has a measuring cap that is marked with a line. Pour the powder into the cap up to the marked line (the dose is about 1 heaping tablespoon). Add the powder in the cap to a full glass (4 to 8 ounces or 120 to 240 mL) of water, juice, soda, coffee or tea. Mix the powder well. Ensure that the powder is fully dissolved. Do not drink if there are any clumps. Drink the solution. Take exactly as directed. Do not take your medicine more often than directed. Talk to your pediatrician regarding the use of this medicine in children. Special care may be needed. Overdosage: If you think you have taken too much of this medicine contact a poison control center or emergency room at once. NOTE: This medicine is only for you. Do not share this medicine with others. What if I miss a dose? If you miss a dose, take it as soon as you can. If it is almost time for your next dose, take only that dose.  Do not take double or extra doses. What may interact with this medicine? Interactions are not expected. This list may not describe all possible interactions. Give your health care provider a list of all the medicines, herbs, non-prescription drugs, or dietary supplements you use. Also tell them if you smoke, drink alcohol, or use illegal drugs. Some items may interact with your medicine. What should I watch for while using this medicine? Do not use for more than 2 weeks without advice from your doctor or health care professional. It can take 2 to 4 days to have a bowel movement and to experience improvement in constipation. See your health care professional for any changes in bowel habits, including constipation, that are severe or last longer than three weeks. Always take this medicine with plenty of water. What side effects may I notice from receiving this medicine? Side effects that you should report to your doctor or health care professional as soon as possible:  diarrhea  difficulty breathing  itching of the skin, hives, or skin rash  severe bloating, pain, or distension of the stomach  vomiting Side effects that usually do not require medical attention (report to your doctor or health care professional if they continue or are bothersome):  bloating or gas  lower abdominal discomfort or cramps  nausea This list may not describe all possible side effects. Call your doctor for medical advice about side effects. You may report side effects to FDA at 1-800-FDA-1088. Where should I keep my medicine? Keep out  of the reach of children. Store between 15 and 30 degrees C (59 and 86 degrees F). Throw away any unused medicine after the expiration date. NOTE: This sheet is a summary. It may not cover all possible information. If you have questions about this medicine, talk to your doctor, pharmacist, or health care provider.  2020 Elsevier/Gold Standard (2018-05-26 10:42:01)

## 2020-05-02 NOTE — Progress Notes (Signed)
[name] [address]  [place], [place] [address]  Main: [phone]  Fax: [phone]   Gastroenterology Consultation  Referring Provider:     [name], FNP Primary Care Physician:  [name], FNP Reason for Consultation:     Anemia        HPI:    Chief complaint: Anemia  [name] is a 69 y.o. y/o male referred for consultation & management  by Dr. Vidal Schwalbe, Yvetta Coder, FNP.  Patient found to have microcytic anemia that is chronic.  Previously evaluated by Dr. Tasia Catchings of hematology and patient has CLL.  Has also previously had hemolytic anemia in 2015 that was treated.  His hemoglobin is at his baseline around 11, and he had an EGD and colonoscopy for anemia in 2016 by Dr. Tollie Pizza that was unrevealing.  At that time his anemia was microcytic as well.  His current ferritin and iron panel do not show evidence of iron deficiency.  Patient denies any episodes of bleeding.  Does report some constipation.  Has some bilateral lower quadrant abdominal pain that goes away after a bowel movement.  No family history of colon cancer.  No dysphagia.  No nausea vomiting or weight loss.  Past Medical History:  Diagnosis Date  . Anemia 2008  . Arthritis   . Cervicalgia   . 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 Tasia Catchings  . Hyperlipidemia   . Low back pain   . Peripheral vascular disease [facility])     Past Surgical History:  Procedure Laterality Date  . COLONOSCOPY    . COLONOSCOPY WITH PROPOFOL N/A 07/23/2015   Procedure: COLONOSCOPY WITH PROPOFOL;  Surgeon: [name], MD;  Location: [facility] ENDOSCOPY;  Service: Endoscopy;  Laterality: N/A;  . ESOPHAGOGASTRODUODENOSCOPY (EGD) WITH PROPOFOL N/A 08/20/2015   Procedure: ESOPHAGOGASTRODUODENOSCOPY (EGD) WITH PROPOFOL;  Surgeon: [name], MD;  Location: [facility] ENDOSCOPY;  Service: Endoscopy;  Laterality: N/A;  . LOWER EXTREMITY ANGIOGRAPHY Left 05/19/2018   Procedure: LOWER  EXTREMITY ANGIOGRAPHY;  Surgeon: Algernon Huxley, MD;  Location: [place] CV LAB;  Service: Cardiovascular;  Laterality: Left;  . LOWER EXTREMITY ANGIOGRAPHY Right 06/15/2018   Procedure: LOWER EXTREMITY ANGIOGRAPHY;  Surgeon: Algernon Huxley, MD;  Location: [place] CV LAB;  Service: Cardiovascular;  Laterality: Right;  . lymp node removal Right 2011   arm   left arm 2015  . LYMPH NODE BIOPSY  08/2014    Prior to Admission medications   Medication Sig Start Date End Date Taking? Authorizing Provider  FLUoxetine (PROZAC) 20 MG tablet Take 1 tablet by mouth daily.   Yes [provider]  ibuprofen (ADVIL,MOTRIN) 200 MG tablet Take 200 mg by mouth every 6 (six) hours as needed.   Yes [provider]  omeprazole (PRILOSEC) 20 MG capsule Take 1 capsule by mouth 1 day or 1 dose. 03/17/19  Yes [provider]  pravastatin (PRAVACHOL) 40 MG tablet Take 1 tablet (40 mg total) by mouth daily. 04/17/20  Yes [name], FNP    Family History  Problem Relation Age of Onset  . Kidney cancer Neg Hx        lung cancer  . Bladder Cancer Neg Hx   . Prostate cancer Neg Hx      Social History   Tobacco Use  . Smoking status: Current Every Day Smoker    Packs/day: 1.00    Years: 49.00    Pack years:  49.00    Types: Cigars  . Smokeless tobacco: Never Used  . Tobacco comment: .5 ppd last year, total of 48.5 pack year  Substance Use Topics  . Alcohol use: No  . Drug use: No    Allergies as of 05/02/2020 - Review Complete 05/02/2020  Allergen Reaction Noted  . Latex Rash 10/11/2014    Review of Systems:    All systems reviewed and negative except where noted in HPI.   Physical Exam:  BP (!) 100/56   Pulse (!) 56   Temp 98.2 F (36.8 C) (Oral)   Ht 5\' 8"  (1.727 m)   Wt 132 lb 12.8 oz (60.2 kg)   BMI 20.19 kg/m  No LMP for male patient. Psych:  Alert and cooperative. Normal mood and affect. General:   Alert,  Well-developed, well-nourished, pleasant  and cooperative in NAD Head:  Normocephalic and atraumatic. Eyes:  Sclera clear, no icterus.   Conjunctiva pink. Ears:  Normal auditory acuity. Nose:  No deformity, discharge, or lesions. Mouth:  No deformity or lesions,oropharynx pink & moist. Neck:  Supple; no masses or thyromegaly. Abdomen:  Normal bowel sounds.  No bruits.  Soft, non-tender and non-distended without masses, hepatosplenomegaly or hernias noted.  No guarding or rebound tenderness.    Msk:  Symmetrical without gross deformities. Good, equal movement & strength bilaterally. Pulses:  Normal pulses noted. Extremities:  No clubbing or edema.  No cyanosis. Neurologic:  Alert and oriented x3;  grossly normal neurologically. Skin:  Intact without significant lesions or rashes. No jaundice. Lymph Nodes:  No significant cervical adenopathy. Psych:  Alert and cooperative. Normal mood and affect.   Labs: CBC    Component Value Date/Time   WBC 25.8 (H) 04/25/2020 1123   RBC 4.72 04/25/2020 1123   HGB 11.6 (L) 04/25/2020 1123   HGB 13.0 03/13/2015 0852   HCT 36.2 (L) 04/25/2020 1123   HCT 40.9 03/13/2015 0852   PLT 218 04/25/2020 1123   PLT 217 03/13/2015 0852   MCV 76.7 (L) 04/25/2020 1123   MCV 78 (L) 03/13/2015 0852   MCH 24.6 (L) 04/25/2020 1123   MCHC 32.0 04/25/2020 1123   RDW 16.5 (H) 04/25/2020 1123   RDW 16.0 (H) 03/13/2015 0852   LYMPHSABS 20.9 (H) 04/25/2020 1123   LYMPHSABS 3.9 (H) 03/13/2015 0852   MONOABS 0.8 04/25/2020 1123   MONOABS 0.4 03/13/2015 0852   EOSABS 0.2 04/25/2020 1123   EOSABS 0.4 03/13/2015 0852   BASOSABS 0.1 04/25/2020 1123   BASOSABS 0.1 03/13/2015 0852   CMP     Component Value Date/Time   NA 140 04/25/2020 1123   NA 138 10/16/2019 0000   NA 136 03/13/2015 0852   K 3.9 04/25/2020 1123   K 3.9 03/13/2015 0852   CL 107 04/25/2020 1123   CL 104 03/13/2015 0852   CO2 25 04/25/2020 1123   CO2 26 03/13/2015 0852   GLUCOSE 88 04/25/2020 1123   GLUCOSE 96 03/13/2015 0852   BUN  16 04/25/2020 1123   BUN 14 10/16/2019 0000   BUN 11 03/13/2015 0852   CREATININE 1.11 04/25/2020 1123   CREATININE 1.17 02/03/2019 1612   CALCIUM 9.2 04/25/2020 1123   CALCIUM 9.3 03/13/2015 0852   PROT 6.8 04/25/2020 1123   PROT 8.1 03/13/2015 0852   ALBUMIN 4.1 04/25/2020 1123   ALBUMIN 4.5 03/13/2015 0852   AST 21 04/25/2020 1123   AST 26 03/13/2015 0852   ALT 13 04/25/2020 1123   ALT 12 (L) 03/13/2015  0852   ALKPHOS 42 04/25/2020 1123   ALKPHOS 57 03/13/2015 0852   BILITOT 1.1 04/25/2020 1123   BILITOT 1.2 03/13/2015 0852   GFRNONAA >60 04/25/2020 1123   GFRNONAA >60 03/13/2015 0852   GFRAA >60 04/25/2020 1123   GFRAA >60 03/13/2015 0852    Imaging Studies: No results found.  Assessment and Plan:   [name] is a 69 y.o. y/o male has been referred for anemia  Anemia is microcytic but no evidence of iron deficiency on recent labs Follow-up with Dr. Tasia Catchings, he sees her next week  Given the patient has already had EGD and colonoscopy 2016 for the same microcytic anemia he has had since 2016, and no neuro signs or symptoms are present at this time, and no iron deficiency is present, EGD and colonoscopy are not indicated at this time.  However, he states he just turned in a stool sample today, and if FOBT is positive, PCP can notify us and we can proceed with colonoscopy at that time.  In addition, I have encouraged him to follow-up with Dr. Tasia Catchings as scheduled, and if their work-up reveals anything new, or any indication for EGD and colonoscopy, we would be happy to schedule procedures at that time  High-fiber diet MiraLAX daily with goal of 1-2 soft bowel movements daily.  If not at goal, patient instructed to increase dose to twice daily.  If loose stools with the medication, patient asked to decrease the medication to every other day, or half dose daily.  Patient verbalized understanding     Dr [name]  Speech recognition software was used to dictate the  above note.

## 2020-05-07 ENCOUNTER — Other Ambulatory Visit: Payer: Self-pay

## 2020-05-07 ENCOUNTER — Telehealth: Payer: Self-pay

## 2020-05-07 ENCOUNTER — Inpatient Hospital Stay (HOSPITAL_BASED_OUTPATIENT_CLINIC_OR_DEPARTMENT_OTHER): Payer: Self-pay | Admitting: Oncology

## 2020-05-07 ENCOUNTER — Inpatient Hospital Stay: Payer: Self-pay

## 2020-05-07 ENCOUNTER — Encounter: Payer: Self-pay | Admitting: Oncology

## 2020-05-07 VITALS — BP 122/74 | HR 51 | Temp 95.7°F | Resp 18 | Wt 133.0 lb

## 2020-05-07 DIAGNOSIS — Z87891 Personal history of nicotine dependence: Secondary | ICD-10-CM

## 2020-05-07 DIAGNOSIS — C9110 Chronic lymphocytic leukemia of B-cell type not having achieved remission: Secondary | ICD-10-CM

## 2020-05-07 DIAGNOSIS — D563 Thalassemia minor: Secondary | ICD-10-CM

## 2020-05-07 NOTE — Telephone Encounter (Signed)
Patient has been notified that the low dose lung cancer screening CT scan is due currently or will be in near future.  Confirmed that patient is within the appropriate age range and asymptomatic, (no signs or symptoms of lung cancer).  Patient denies illness that would prevent curative treatment for lung cancer if found.  Patient is agreeable for CT scan being scheduled.    Verified smoking history (current smoker of cigars, 50 year 1 cigar per day history).   Current insurance on file is Medicare Part A.  He now has Medicare Part B as well but does not have a new card.  CT scan scheduled for 05/15/20 @ 11:00

## 2020-05-07 NOTE — Progress Notes (Signed)
Pt here for follow up. Pt reports having intermittent pressure to abdomen that is relieved when uses bathroom.This pressure is worst in the mornings. Sometimes the pressure is bad it makes him feel like he "wants to pass out. Pt started a clinical study on CLL through [facility]. Pt started on B12 injections at PCP.

## 2020-05-07 NOTE — Progress Notes (Signed)
[name] day:  05/07/20    Chief Complaint: [name] is a 69 y.o. 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  HPI:  The patient was last seen in the medical onocology clinic on 12/25/2016. Patient reports feeling profound fatigue, lack of appetite, weight loss. He weight 128 pounds today, comparing to 137 pounds when he was here in January 2018.  He met with [name], registered dietitian on 01/04/2017. Patient used to follow up with Dr.[name].    Treatment history: He originally presented with hemolytic anemia in 01/2014.  He was treated with prednisone. With taper of prednisone, his hemolysis returned.I think it's only positives are Per Dr.[name]'s note, patient received 4 weekly cycles of Rituxan (10/02/2014- 10/23/2014).   07/23/2017 Decision was made to start Ibrutinib as patient is very symptomatic (fatigue and weight loss, lack of appetite). Patient did not show up at the chemo education class. He was never started on Ibrutinib and his constitutional symptoms and is symptoms improved.  We agreed on not to proceed with treatments as it was not clear whether his symptoms are secondary to CLL or not. Patient was recommended for watchful waiting. 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   INTERVAL HISTORY Patient presents for follow-up of CLL.  Patient was last seen by me on 06/03/2018.  After that he lost follow-up. During interval, patient seek second opinion at [facility] and was evaluated by heme-onc Dr.Ellis.  Patient was recommended for watchful waiting.  Patient was seen by [facility] heme-onc in October 2020 and he has not had follow-up there yet.  Recently he had blood work done with primary care provider.  White count has increased to 25.8. Patient was advised to reestablish care with [name] .  Today patient reports doing well.  Denies any unintentional weight loss,  night sweating, fever or chills.  His weight is 130 pounds today which is stable comparing to his weight 2 years ago.[name]  He reports some intermittent pressure to the abdomen which relieved after bowel movements. Patient also started vitamin B12 injections with primary care provider.  No other complaints today.  Past Medical History:  Diagnosis Date  . Anemia 2008  . Arthritis   . Cervicalgia   . 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 Tasia Catchings  . Hyperlipidemia   . Low back pain   . Peripheral vascular disease [facility])     Past Surgical History:  Procedure Laterality Date  . COLONOSCOPY    . COLONOSCOPY WITH PROPOFOL N/A 07/23/2015   Procedure: COLONOSCOPY WITH PROPOFOL;  Surgeon: [name], MD;  Location: [facility] ENDOSCOPY;  Service: Endoscopy;  Laterality: N/A;  . ESOPHAGOGASTRODUODENOSCOPY (EGD) WITH PROPOFOL N/A 08/20/2015   Procedure: ESOPHAGOGASTRODUODENOSCOPY (EGD) WITH PROPOFOL;  Surgeon: [name], MD;  Location: [facility] ENDOSCOPY;  Service: Endoscopy;  Laterality: N/A;  . LOWER EXTREMITY ANGIOGRAPHY Left 05/19/2018   Procedure: LOWER EXTREMITY ANGIOGRAPHY;  Surgeon: Algernon Huxley, MD;  Location: [place] CV LAB;  Service: Cardiovascular;  Laterality: Left;  . LOWER EXTREMITY ANGIOGRAPHY Right 06/15/2018   Procedure: LOWER EXTREMITY ANGIOGRAPHY;  Surgeon: Algernon Huxley, MD;  Location: [place] CV LAB;  Service: Cardiovascular;  Laterality: Right;  . lymp node removal Right 2011   arm   left arm 2015  . LYMPH NODE BIOPSY  08/2014    Family History  Problem Relation Age of Onset  . Kidney  cancer Neg Hx        lung cancer  . Bladder Cancer Neg Hx   . Prostate cancer Neg Hx     Social History:  reports that he has been smoking cigars. He has a 49.00 pack-year smoking history. He has never used smokeless tobacco. He reports that he does not drink alcohol or use drugs.  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 He works 11-12 hours a day  (11:30 PM - 7:30 AM at [employer]).  Allergies:  Allergies  Allergen Reactions  . Latex Rash   Review of Systems:  Review of Systems  Constitutional: Negative for chills, fever, malaise/fatigue and weight loss.  HENT: Negative for ear discharge, ear pain, nosebleeds and sore throat.   Eyes: Negative for photophobia, pain and redness.  Respiratory: Negative for cough, hemoptysis, sputum production, shortness of breath and wheezing.   Cardiovascular: Negative for chest pain, palpitations and leg swelling.  Gastrointestinal: Negative for abdominal pain, blood in stool, constipation, diarrhea, heartburn, nausea and vomiting.  Genitourinary: Negative for dysuria and frequency.  Musculoskeletal: Negative for myalgias and neck pain.  Skin: Negative for rash.  Neurological: Negative for dizziness, tingling, tremors, weakness and headaches.  Endo/Heme/Allergies: Does not bruise/bleed easily.  Psychiatric/Behavioral: Negative for depression, hallucinations and suicidal ideas.   Current Outpatient Medications on File Prior to Visit  Medication Sig Dispense Refill  . FLUoxetine (PROZAC) 20 MG tablet Take 1 tablet by mouth daily.    [name] ibuprofen (ADVIL,MOTRIN) 200 MG tablet Take 200 mg by mouth every 6 (six) hours as needed.    [name] omeprazole (PRILOSEC) 20 MG capsule Take 1 capsule by mouth 1 day or 1 dose.    . pravastatin (PRAVACHOL) 40 MG tablet Take 1 tablet (40 mg total) by mouth daily. (Patient not taking: Reported on 05/07/2020) 90 tablet 3   No current facility-administered medications on file prior to visit.    Physical Exam: Blood pressure 122/74, pulse (!) 51, temperature (!) 95.7 F (35.4 C), resp. rate 18, weight 133 lb (60.3 kg). Physical Exam  Constitutional: He is oriented to person, place, and time. No distress.  HENT:  Head: Normocephalic and atraumatic.  Nose: Nose normal.  Mouth/Throat: Oropharynx is clear and moist. No oropharyngeal exudate.  Eyes: Pupils are equal,  round, and reactive to light. EOM are normal. No scleral icterus.  Cardiovascular: Normal rate and regular rhythm.  No murmur heard. Pulmonary/Chest: Effort normal. No respiratory distress. He has no rales. He exhibits no tenderness.  Abdominal: Soft. He exhibits no distension. There is no abdominal tenderness.  Musculoskeletal:        General: No edema. Normal range of motion.     Cervical back: Normal range of motion and neck supple.  Neurological: He is alert and oriented to person, place, and time. No cranial nerve deficit. He exhibits normal muscle tone. Coordination normal.  Skin: Skin is warm and dry. He is not diaphoretic. No erythema.  Psychiatric: Affect normal.   LABORATORY RESULTS. CBC    Component Value Date/Time   WBC 25.8 (H) 04/25/2020 1123   RBC 4.72 04/25/2020 1123   HGB 11.6 (L) 04/25/2020 1123   HGB 13.0 03/13/2015 0852   HCT 36.2 (L) 04/25/2020 1123   HCT 40.9 03/13/2015 0852   PLT 218 04/25/2020 1123   PLT 217 03/13/2015 0852   MCV 76.7 (L) 04/25/2020 1123   MCV 78 (L) 03/13/2015 0852   MCH 24.6 (L) 04/25/2020 1123   MCHC 32.0 04/25/2020 1123  RDW 16.5 (H) 04/25/2020 1123   RDW 16.0 (H) 03/13/2015 0852   LYMPHSABS 20.9 (H) 04/25/2020 1123   LYMPHSABS 3.9 (H) 03/13/2015 0852   MONOABS 0.8 04/25/2020 1123   MONOABS 0.4 03/13/2015 0852   EOSABS 0.2 04/25/2020 1123   EOSABS 0.4 03/13/2015 0852   BASOSABS 0.1 04/25/2020 1123   BASOSABS 0.1 03/13/2015 0852   CMP     Component Value Date/Time   NA 140 04/25/2020 1123   NA 138 10/16/2019 0000   NA 136 03/13/2015 0852   K 3.9 04/25/2020 1123   K 3.9 03/13/2015 0852   CL 107 04/25/2020 1123   CL 104 03/13/2015 0852   CO2 25 04/25/2020 1123   CO2 26 03/13/2015 0852   GLUCOSE 88 04/25/2020 1123   GLUCOSE 96 03/13/2015 0852   BUN 16 04/25/2020 1123   BUN 14 10/16/2019 0000   BUN 11 03/13/2015 0852   CREATININE 1.11 04/25/2020 1123   CREATININE 1.17 02/03/2019 1612   CALCIUM 9.2 04/25/2020 1123    CALCIUM 9.3 03/13/2015 0852   PROT 6.8 04/25/2020 1123   PROT 8.1 03/13/2015 0852   ALBUMIN 4.1 04/25/2020 1123   ALBUMIN 4.5 03/13/2015 0852   AST 21 04/25/2020 1123   AST 26 03/13/2015 0852   ALT 13 04/25/2020 1123   ALT 12 (L) 03/13/2015 0852   ALKPHOS 42 04/25/2020 1123   ALKPHOS 57 03/13/2015 0852   BILITOT 1.1 04/25/2020 1123   BILITOT 1.2 03/13/2015 0852   GFRNONAA >60 04/25/2020 1123   GFRNONAA >60 03/13/2015 0852   GFRAA >60 04/25/2020 1123   GFRAA >60 03/13/2015 0852   PATHOLOGY:  11/02/2016 Peripheral blood FISH studies revealed 20% of nuclei positive for loss of 1 ATM signal, 63% of nuclei positive for trisomy 12, and 32% of nuclei positive for loss of 1 TP53 signal. Results for CCND1/IGH and 13q were normal.  09/13/2014 Left axillary node biopsy on  confirmed B-cell small lymphocytic lymphoma (B-CLL/SLL)   IMAGE STUDIES  02/09/2014 Chest and abdomen CT scan  revealed bulky bilateral axillary adenopathy and bulky intraperitoneal and retroperitoneal adenopathy. 07/11/2014 PET scan  revealed mildly hypermetabolic lymph nodes in the chest, abdomen, and pelvis and mildly hypermetabolic spleen.  06/24/2016 PET scan  revealed bilateral moderate hypermetabolic axillary lymphadenopathy increased in size (1.6 cm compared to 1.3 cm) and metabolism (SUV 3.9 compared to 3.6).  There were bilateral moderate hypermetabolic external iliac lymphadenopathy stable to decreased in metabolism.  There was mesenteric and retroperitoneal lymphadenopathy stable in size and non hypermetabolic.  There were no new sites of hypermetabolic lymphoma.  There were subcentimeter pulmonary nodules stable and below PET resolution. 07/21/2017 CT chest abdomen pelvis w contrast:  Mild interval increase in bulky mesenteric lymphadenopathy since prior exam. Stable lymphadenopathy in bilateral axillary regions, abdominal retroperitoneum, and bilateral external iliac chains.  PREVIOUS ENDOSCOPY He has slowly  progressive microcytic anemia.  Colonoscopy on 07/23/2015 revealed diverticulosis in the sigmoid colon.  EGD on 08/20/2015 was normal. Guaiac cards x 3 were negative in 06/2015.  His diet fluctuates with his appetite.  He denies any melana or hematochezia. No results found.  Assessment:  Javohn Basey is a 69 y.o. male with B-cell small lymphocytic lymphoma (B-cell CLL/SLL) initially presented with hemolytic anemia in 01/2014, s/p treatment with steroid and Rituximab, presents for follow-up.  1. CLL (chronic lymphocytic leukemia) ([place])   2. Personal history of tobacco use, presenting hazards to health   3. Beta thalassemia minor     #CLL, Labs reviewed  and discussed with patient. Patient continues to have leukocytosis, with total white count 25.8, absolute lymphocyte 20.9. Clinically he is doing well with no constitutional symptoms.  He previous symptoms of unintentional weight loss has resolved.  His weight has remained stable for the past year and a half. I recommend continue watchful waiting. We will check IGVH status and CLL panel   #Beta thalassemia minor, with microcytic anemia.  Hemoglobin at baseline.  No further work-up. Iron panel is not consistent with iron deficiency.  #History of weight loss, patient is weight seems to be stable.  #Tobacco abuse, smoke cessation discussed with patient.  Patient is overdue for lung cancer screening.  Follow up with me in 3 months. Orders Placed This Encounter  Procedures  . Comprehensive metabolic panel    Standing Status:   Future    Standing Expiration Date:   11/07/2021  . CBC with Differential/Platelet    Standing Status:   Future    Standing Expiration Date:   11/07/2021  . Lactate dehydrogenase    Standing Status:   Future    Standing Expiration Date:   05/07/2021   We spent sufficient time to discuss many aspect of care, questions were answered to patient's satisfaction. A total of 40 minutes was spent on this visit.  I have not  seen patient since June 2019.  I spent 20 minutes reviewing medical records, second opinion recommendation, image findings, pathology reports, 20 minutes counseling the patient management of CLL, smoke cessation and recommendation of CT lung cancer screening images..  Additional 5 minutes was spent on answering patient's questions.    Earlie Server, MD, PhD Hematology Oncology [facility] Pager- [phone] 05/07/2020

## 2020-05-08 ENCOUNTER — Other Ambulatory Visit: Payer: Self-pay

## 2020-05-08 ENCOUNTER — Encounter: Payer: Self-pay | Admitting: Family

## 2020-05-08 ENCOUNTER — Ambulatory Visit (INDEPENDENT_AMBULATORY_CARE_PROVIDER_SITE_OTHER): Payer: Self-pay

## 2020-05-08 DIAGNOSIS — E538 Deficiency of other specified B group vitamins: Secondary | ICD-10-CM

## 2020-05-08 MED ORDER — CYANOCOBALAMIN 1000 MCG/ML IJ SOLN
1000.0000 ug | Freq: Once | INTRAMUSCULAR | Status: AC
  Administered 2020-05-08: 1000 ug via INTRAMUSCULAR

## 2020-05-08 NOTE — Progress Notes (Signed)
Patient presented for B 12 injection to right deltoid, patient voiced no concerns nor showed any signs of distress during injection. 

## 2020-05-10 NOTE — Telephone Encounter (Signed)
Patient informed/reminder of low dose lung cancer screening CT scan appointment on 05/15/2020 @ 11:00

## 2020-05-14 ENCOUNTER — Telehealth: Payer: Self-pay | Admitting: Family

## 2020-05-14 NOTE — Telephone Encounter (Signed)
Printed and mailed

## 2020-05-14 NOTE — Telephone Encounter (Signed)
Mail letter  [name],   I am writing to let you know that a lab that we ordered:   Stool cards  Will soon expire. Please return in the next week or you may call and make a follow up appointment with me to discuss this test.   [facility] you are doing well!  Best, [name], NP

## 2020-05-15 ENCOUNTER — Ambulatory Visit
Admission: RE | Admit: 2020-05-15 | Discharge: 2020-05-15 | Disposition: A | Discharge status: Home / Self Care | Payer: Self-pay | Source: Ambulatory Visit | Attending: Oncology | Admitting: Oncology

## 2020-05-15 ENCOUNTER — Inpatient Hospital Stay (HOSPITAL_BASED_OUTPATIENT_CLINIC_OR_DEPARTMENT_OTHER): Payer: Self-pay | Admitting: Oncology

## 2020-05-15 ENCOUNTER — Other Ambulatory Visit: Payer: Self-pay

## 2020-05-15 DIAGNOSIS — Z87891 Personal history of nicotine dependence: Secondary | ICD-10-CM

## 2020-05-15 DIAGNOSIS — Z122 Encounter for screening for malignant neoplasm of respiratory organs: Secondary | ICD-10-CM

## 2020-05-15 DIAGNOSIS — F17210 Nicotine dependence, cigarettes, uncomplicated: Secondary | ICD-10-CM

## 2020-05-15 NOTE — Progress Notes (Signed)
Virtual Visit via Video Note  I connected with Mr. Golson on 05/15/20 at 11:00 AM EDT by a video enabled telemedicine application and verified that I am speaking with the correct person using two identifiers.  Location: Patient: Home Provider: Clinic    I discussed the limitations of evaluation and management by telemedicine and the availability of in person appointments. The patient expressed understanding and agreed to proceed.  I discussed the assessment and treatment plan with the patient. The patient was provided an opportunity to ask questions and all were answered. The patient agreed with the plan and demonstrated an understanding of the instructions.   The patient was advised to call back or seek an in-person evaluation if the symptoms worsen or if the condition fails to improve as anticipated.   In accordance with CMS guidelines, patient has met eligibility criteria including age, absence of signs or symptoms of lung cancer.  Social History   Tobacco Use  . Smoking status: Current Every Day Smoker    Packs/day: 1.00    Years: 49.00    Pack years: 49.00    Types: Cigars  . Smokeless tobacco: Never Used  . Tobacco comment: .5 ppd last year, total of 48.5 pack year  Substance Use Topics  . Alcohol use: No  . Drug use: No      A shared decision-making session was conducted prior to the performance of CT scan. This includes one or more decision aids, includes benefits and harms of screening, follow-up diagnostic testing, over-diagnosis, false positive rate, and total radiation exposure.   Counseling on the importance of adherence to annual lung cancer LDCT screening, impact of co-morbidities, and ability or willingness to undergo diagnosis and treatment is imperative for compliance of the program.   Counseling on the importance of continued smoking cessation for former smokers; the importance of smoking cessation for current smokers, and information about tobacco cessation  interventions have been given to patient including [place] and 1800 quit  programs.   Written order for lung cancer screening with LDCT has been given to the patient and any and all questions have been answered to the best of my abilities.    Yearly follow up will be coordinated by Burgess Estelle, Thoracic Navigator.  I provided 15 minutes of face-to-face video visit time during this encounter, and > 50% was spent counseling as documented under my assessment & plan.   Jacquelin Hawking, NP

## 2020-05-17 ENCOUNTER — Encounter: Payer: Self-pay | Admitting: *Deleted

## 2020-05-27 ENCOUNTER — Telehealth: Payer: Self-pay | Admitting: Family

## 2020-05-27 DIAGNOSIS — Z1211 Encounter for screening for malignant neoplasm of colon: Secondary | ICD-10-CM

## 2020-05-27 NOTE — Telephone Encounter (Signed)
Call pt Reviewing his chart He appears to be on a  5 year repeat with colonoscopy His last one was with Dr Terri Piedra in 2016 so he would be due this year May I place referral back for colonscopy?

## 2020-05-29 NOTE — Telephone Encounter (Signed)
Called and could not leave voicemail.

## 2020-05-30 NOTE — Telephone Encounter (Signed)
Attempted to call and speak with patient. Phone continued to ring and could not leave message.

## 2020-05-31 NOTE — Telephone Encounter (Signed)
Dalin verbalized understanding and had no further questions. Would like you to place the referral.

## 2020-06-03 ENCOUNTER — Telehealth: Payer: Self-pay | Admitting: Gastroenterology

## 2020-06-03 NOTE — Telephone Encounter (Signed)
Call pt and let him know that Colonoscopy referral placed to GI Let us know if you dont hear back within a week in regards to an appointment being scheduled.

## 2020-06-03 NOTE — Telephone Encounter (Signed)
Patient verbalized understanding and had no further questions.  

## 2020-06-05 NOTE — Telephone Encounter (Signed)
Endo/colon 07/2015 for IDA -was normal. He does not need repeat colonoscopy (unless there is a family history or if he has any new problems) He should follow-up with his previous GI doc (because looks like he knows him well)  RG

## 2020-06-06 NOTE — Telephone Encounter (Signed)
Patient made aware of Dr. Leland Her notes. Was okay with recommendation.

## 2020-06-12 ENCOUNTER — Ambulatory Visit: Payer: Self-pay

## 2020-07-11 ENCOUNTER — Telehealth: Payer: Self-pay | Admitting: Family

## 2020-07-11 ENCOUNTER — Ambulatory Visit (INDEPENDENT_AMBULATORY_CARE_PROVIDER_SITE_OTHER): Payer: Self-pay

## 2020-07-11 ENCOUNTER — Other Ambulatory Visit: Payer: Self-pay

## 2020-07-11 DIAGNOSIS — E538 Deficiency of other specified B group vitamins: Secondary | ICD-10-CM

## 2020-07-11 MED ORDER — CYANOCOBALAMIN 1000 MCG/ML IJ SOLN
1000.0000 ug | Freq: Once | INTRAMUSCULAR | Status: AC
  Administered 2020-07-11: 1000 ug via INTRAMUSCULAR

## 2020-07-11 NOTE — Telephone Encounter (Signed)
Thanks for letting me know about cough Please call and advise patient that I recommend an appt for this.  He can do a virtual with [name] if we have one soon either myself or another provider; if we do not have availability and especially as we approach weekend,  or if symptoms progressive, he need to be seen in an urgent care

## 2020-07-11 NOTE — Progress Notes (Addendum)
Patient presented for B 12 injection to left deltoid, patient voiced no concerns nor showed any signs of distress during injection.  Patient stated that ever since he received his covid injections; he has been coughing and has been having chest congestion. When he lays down at night for bed he states he can hear a slight rattle. He does not have SOB, fever, chills, chest pain, nasal congestion, sore throat, and heart palpitations.  See phone note addressing cough [name], np

## 2020-07-17 NOTE — Telephone Encounter (Signed)
Please circle back here 

## 2020-07-19 NOTE — Telephone Encounter (Signed)
Called patient to discuss recent cough. [name] states that he is still having a cough with a lot of mucus. It has been present since 7.22.21. I recommended he go to the urgent care if he is experiencing any discomfort or continuation of symptoms. Scheduled for soonest Telephone visit with [name], which is 8.4.21.

## 2020-07-19 NOTE — Telephone Encounter (Signed)
noted 

## 2020-07-24 ENCOUNTER — Encounter: Payer: Self-pay | Admitting: Family

## 2020-07-24 ENCOUNTER — Telehealth (INDEPENDENT_AMBULATORY_CARE_PROVIDER_SITE_OTHER): Payer: Self-pay | Admitting: Family

## 2020-07-24 VITALS — Ht 67.99 in | Wt 128.0 lb

## 2020-07-24 DIAGNOSIS — R05 Cough: Secondary | ICD-10-CM

## 2020-07-24 DIAGNOSIS — K219 Gastro-esophageal reflux disease without esophagitis: Secondary | ICD-10-CM

## 2020-07-24 DIAGNOSIS — J439 Emphysema, unspecified: Secondary | ICD-10-CM

## 2020-07-24 DIAGNOSIS — I2510 Atherosclerotic heart disease of native coronary artery without angina pectoris: Secondary | ICD-10-CM

## 2020-07-24 DIAGNOSIS — R059 Cough, unspecified: Secondary | ICD-10-CM

## 2020-07-24 MED ORDER — FAMOTIDINE 10 MG PO TABS: 10.0000 mg | ORAL_TABLET | Freq: Two times a day (BID) | ORAL | 0 refills | Status: DC

## 2020-07-24 MED ORDER — AZITHROMYCIN 250 MG PO TABS: ORAL_TABLET | ORAL | 0 refills | Status: DC

## 2020-07-24 NOTE — Assessment & Plan Note (Addendum)
Atherosclerosis seen again on CT chest, continue pravachol

## 2020-07-24 NOTE — Progress Notes (Signed)
Verbal consent for services obtained from patient prior to services given to TELEPHONE visit:   Location of call:  provider at work patient at home  Names of all persons present for services: [name], NP and patient  Chief complaint of productive cough and nasal congestion x2 to 3 months, 'all the time'.  Hard time in the mornings to clear congestion.  Reports increased purulence of congestion and congestion itself.  Endorses nasal congestion,  Other than that ' I feel great.'  No wheezing, fever, palpitations, fever, sinus pain, facial swelling, loss or taste smell,  Tried mucinex and didn't like the taste without much relief.  Treated for cough at [facility] urgent care 04/2020 with omnicef with complete resolution at that time. Not sure when symptoms recurred. Covid negative at that time.    At times may have SOB with going up steps. No sob when sitting.  CT chest 04/2020 We also like to discuss how to stop smoking cigars, black n mild. Uses daily, smoke about 2 per day. Quit in the past cold [place].   Also complains of burping, epigastric burning. Hasnt tried medication for this. He doesn't think he takes prilosec.   HLD- compliant with pravastatin  History, background, results pertinent:  COVID vaccinated  A/P/next steps: Problem List Items Addressed This Visit      Cardiovascular and Mediastinum   Coronary atherosclerosis    Atherosclerosis seen again on CT chest, continue pravachol        Respiratory   Emphysema lung (HCC)   Relevant Medications   fluticasone (FLONASE) 50 MCG/ACT nasal spray   azithromycin (ZITHROMAX) 250 MG tablet     Digestive   Gastroesophageal reflux disease - Primary   Relevant Medications   famotidine (PEPCID) 10 MG tablet     Other   Cough    Patient was not labored in speech over the phone today.  Increase purulence, amount of congestion, longstanding smoker of cigars.  I suspect a COPD exacerbation.  He recently was treated with [facility]  with complete resolution of symptoms.  Opted to treat with macrolide today, repeat chest x-ray.  Long discussion regards to smoking cessation.  He politely declines medications to aid with his cessation.  He quit "cold [place]" in the past and would like to pursue this again.      Relevant Medications   azithromycin (ZITHROMAX) 250 MG tablet   Other Relevant Orders   DG Chest 2 View       I spent 15 min  discussing plan of care over the phone.

## 2020-07-24 NOTE — Assessment & Plan Note (Signed)
Patient was not labored in speech over the phone today.  Increase purulence, amount of congestion, longstanding smoker of cigars.  I suspect a COPD exacerbation.  He recently was treated with [facility] with complete resolution of symptoms.  Opted to treat with macrolide today, repeat chest x-ray.  Long discussion regards to smoking cessation.  He politely declines medications to aid with his cessation.  He quit "cold [place]" in the past and would like to pursue this again.

## 2020-07-30 ENCOUNTER — Other Ambulatory Visit: Payer: Self-pay

## 2020-07-30 ENCOUNTER — Ambulatory Visit
Admission: RE | Admit: 2020-07-30 | Discharge: 2020-07-30 | Disposition: A | Discharge status: Home / Self Care | Payer: Self-pay | Attending: Family | Admitting: Family

## 2020-07-30 ENCOUNTER — Ambulatory Visit
Admission: RE | Admit: 2020-07-30 | Discharge: 2020-07-30 | Disposition: A | Discharge status: Home / Self Care | Payer: Self-pay | Source: Ambulatory Visit | Attending: Family | Admitting: Family

## 2020-07-30 DIAGNOSIS — R05 Cough: Secondary | ICD-10-CM | POA: Insufficient documentation

## 2020-08-06 ENCOUNTER — Inpatient Hospital Stay: Payer: Self-pay | Attending: Oncology

## 2020-08-06 ENCOUNTER — Inpatient Hospital Stay: Payer: Self-pay | Admitting: Oncology

## 2020-08-06 ENCOUNTER — Other Ambulatory Visit: Payer: Self-pay | Admitting: Oncology

## 2020-08-06 DIAGNOSIS — C9110 Chronic lymphocytic leukemia of B-cell type not having achieved remission: Secondary | ICD-10-CM

## 2020-08-14 ENCOUNTER — Other Ambulatory Visit: Payer: Self-pay

## 2020-08-14 ENCOUNTER — Ambulatory Visit (INDEPENDENT_AMBULATORY_CARE_PROVIDER_SITE_OTHER): Payer: Self-pay

## 2020-08-14 DIAGNOSIS — E538 Deficiency of other specified B group vitamins: Secondary | ICD-10-CM

## 2020-08-14 MED ORDER — CYANOCOBALAMIN 1000 MCG/ML IJ SOLN
1000.0000 ug | Freq: Once | INTRAMUSCULAR | Status: AC
  Administered 2020-08-14: 1000 ug via INTRAMUSCULAR

## 2020-08-14 NOTE — Progress Notes (Signed)
Patient presented for B 12 injection to left deltoid, patient voiced no concerns nor showed any signs of distress during injection. 

## 2020-09-18 ENCOUNTER — Other Ambulatory Visit: Payer: Self-pay

## 2020-09-18 ENCOUNTER — Ambulatory Visit (INDEPENDENT_AMBULATORY_CARE_PROVIDER_SITE_OTHER): Payer: Self-pay

## 2020-09-18 DIAGNOSIS — E538 Deficiency of other specified B group vitamins: Secondary | ICD-10-CM

## 2020-09-18 MED ORDER — CYANOCOBALAMIN 1000 MCG/ML IJ SOLN
1000.0000 ug | Freq: Once | INTRAMUSCULAR | Status: AC
  Administered 2020-09-18: 1000 ug via INTRAMUSCULAR

## 2020-09-18 NOTE — Progress Notes (Signed)
Patient presented for B 12 injection to right deltoid, patient voiced no concerns nor showed any signs of distress during injection. 

## 2020-10-22 ENCOUNTER — Other Ambulatory Visit: Payer: Self-pay

## 2020-10-22 ENCOUNTER — Ambulatory Visit (INDEPENDENT_AMBULATORY_CARE_PROVIDER_SITE_OTHER): Payer: Self-pay

## 2020-10-22 DIAGNOSIS — E538 Deficiency of other specified B group vitamins: Secondary | ICD-10-CM

## 2020-10-22 DIAGNOSIS — Z23 Encounter for immunization: Secondary | ICD-10-CM

## 2020-10-22 MED ORDER — CYANOCOBALAMIN 1000 MCG/ML IJ SOLN
1000.0000 ug | Freq: Once | INTRAMUSCULAR | Status: AC
  Administered 2020-10-22: 1000 ug via INTRAMUSCULAR

## 2020-10-22 NOTE — Progress Notes (Signed)
Patient presented for B 12 injection to left deltoid, patient voiced no concerns nor showed any signs of distress during injection. 

## 2020-11-26 ENCOUNTER — Ambulatory Visit (INDEPENDENT_AMBULATORY_CARE_PROVIDER_SITE_OTHER): Payer: Self-pay

## 2020-11-26 ENCOUNTER — Other Ambulatory Visit: Payer: Self-pay

## 2020-11-26 DIAGNOSIS — E538 Deficiency of other specified B group vitamins: Secondary | ICD-10-CM

## 2020-11-26 MED ORDER — CYANOCOBALAMIN 1000 MCG/ML IJ SOLN
1000.0000 ug | Freq: Once | INTRAMUSCULAR | Status: AC
  Administered 2020-11-26: 1000 ug via INTRAMUSCULAR

## 2020-11-26 NOTE — Progress Notes (Signed)
Patient presented for B 12 injection to left deltoid, patient voiced no concerns nor showed any signs of distress during injection. 

## 2020-12-31 ENCOUNTER — Ambulatory Visit (INDEPENDENT_AMBULATORY_CARE_PROVIDER_SITE_OTHER): Payer: Self-pay

## 2020-12-31 ENCOUNTER — Other Ambulatory Visit: Payer: Self-pay

## 2020-12-31 ENCOUNTER — Telehealth: Payer: Self-pay

## 2020-12-31 DIAGNOSIS — E538 Deficiency of other specified B group vitamins: Secondary | ICD-10-CM

## 2020-12-31 MED ORDER — CYANOCOBALAMIN 1000 MCG/ML IJ SOLN
1000.0000 ug | Freq: Once | INTRAMUSCULAR | Status: AC
  Administered 2020-12-31: 1000 ug via INTRAMUSCULAR

## 2020-12-31 NOTE — Telephone Encounter (Signed)
Pt came into the office today for a B12 injection. He asks if he could switch to B12 pills instead due to convenience.

## 2020-12-31 NOTE — Progress Notes (Signed)
Patient presented for B 12 injection to right deltoid, patient voiced no concerns nor showed any signs of distress during injection. 

## 2020-12-31 NOTE — Telephone Encounter (Signed)
Call pt He may dc IM b12 and take b12 OTC 1061mcg QD

## 2020-12-31 NOTE — Telephone Encounter (Signed)
I tried to call patient but was unable to reach. VM was not set up to leave message.

## 2021-01-10 NOTE — Telephone Encounter (Signed)
FYI finally reached patient. After his 2/11 NV for B-12 he will d/c & do OTC B-12. He will let us know he feels doing this.

## 2021-01-31 ENCOUNTER — Ambulatory Visit: Payer: Self-pay

## 2021-02-03 ENCOUNTER — Other Ambulatory Visit: Payer: Self-pay

## 2021-02-03 ENCOUNTER — Ambulatory Visit (INDEPENDENT_AMBULATORY_CARE_PROVIDER_SITE_OTHER): Payer: Self-pay

## 2021-02-03 DIAGNOSIS — E538 Deficiency of other specified B group vitamins: Secondary | ICD-10-CM

## 2021-02-03 MED ORDER — CYANOCOBALAMIN 1000 MCG/ML IJ SOLN
1000.0000 ug | Freq: Once | INTRAMUSCULAR | Status: AC
  Administered 2021-02-03: 1000 ug via INTRAMUSCULAR

## 2021-02-03 NOTE — Progress Notes (Signed)
Patient presented for B 12 injection to left deltoid, patient voiced no concerns nor showed any signs of distress during injection. 

## 2021-03-06 ENCOUNTER — Ambulatory Visit (INDEPENDENT_AMBULATORY_CARE_PROVIDER_SITE_OTHER): Payer: Self-pay

## 2021-03-06 ENCOUNTER — Other Ambulatory Visit: Payer: Self-pay

## 2021-03-06 ENCOUNTER — Ambulatory Visit: Payer: Self-pay

## 2021-03-06 DIAGNOSIS — E538 Deficiency of other specified B group vitamins: Secondary | ICD-10-CM

## 2021-03-06 MED ORDER — CYANOCOBALAMIN 1000 MCG/ML IJ SOLN
1000.0000 ug | Freq: Once | INTRAMUSCULAR | Status: AC
  Administered 2021-03-06: 1000 ug via INTRAMUSCULAR

## 2021-03-06 NOTE — Progress Notes (Signed)
Patient presented for B 12 injection to right deltoid, patient voiced no concerns nor showed any signs of distress during injection. 

## 2021-04-08 ENCOUNTER — Ambulatory Visit (INDEPENDENT_AMBULATORY_CARE_PROVIDER_SITE_OTHER): Payer: Self-pay

## 2021-04-08 ENCOUNTER — Other Ambulatory Visit: Payer: Self-pay

## 2021-04-08 ENCOUNTER — Ambulatory Visit: Payer: Self-pay

## 2021-04-08 DIAGNOSIS — E538 Deficiency of other specified B group vitamins: Secondary | ICD-10-CM

## 2021-04-08 MED ORDER — CYANOCOBALAMIN 1000 MCG/ML IJ SOLN
1000.0000 ug | Freq: Once | INTRAMUSCULAR | Status: AC
  Administered 2021-04-08: 1000 ug via INTRAMUSCULAR

## 2021-04-08 NOTE — Progress Notes (Signed)
[name] presents today for injection per MD orders. B12 injection administered IM in left Upper Arm. Administration without incident. Patient tolerated well.  Dajha Urquilla,cma

## 2021-04-09 ENCOUNTER — Telehealth: Payer: Self-pay | Admitting: Family

## 2021-04-09 NOTE — Telephone Encounter (Signed)
Patient called and scheduled for follow-up 5/6.

## 2021-04-09 NOTE — Telephone Encounter (Signed)
Call pt He is overdue for f/u I would also like to draw b12 lab Please sch

## 2021-04-25 ENCOUNTER — Ambulatory Visit: Payer: Self-pay | Admitting: Family

## 2021-04-30 ENCOUNTER — Ambulatory Visit (INDEPENDENT_AMBULATORY_CARE_PROVIDER_SITE_OTHER): Payer: Self-pay | Admitting: Family

## 2021-04-30 ENCOUNTER — Other Ambulatory Visit: Payer: Self-pay

## 2021-04-30 ENCOUNTER — Telehealth: Payer: Self-pay

## 2021-04-30 ENCOUNTER — Encounter: Payer: Self-pay | Admitting: Family

## 2021-04-30 VITALS — BP 120/62 | HR 54 | Temp 98.5°F | Ht 67.99 in | Wt 127.8 lb

## 2021-04-30 DIAGNOSIS — C9111 Chronic lymphocytic leukemia of B-cell type in remission: Secondary | ICD-10-CM

## 2021-04-30 DIAGNOSIS — R197 Diarrhea, unspecified: Secondary | ICD-10-CM

## 2021-04-30 DIAGNOSIS — J439 Emphysema, unspecified: Secondary | ICD-10-CM

## 2021-04-30 DIAGNOSIS — R001 Bradycardia, unspecified: Secondary | ICD-10-CM

## 2021-04-30 DIAGNOSIS — R42 Dizziness and giddiness: Secondary | ICD-10-CM

## 2021-04-30 DIAGNOSIS — Z125 Encounter for screening for malignant neoplasm of prostate: Secondary | ICD-10-CM

## 2021-04-30 DIAGNOSIS — E538 Deficiency of other specified B group vitamins: Secondary | ICD-10-CM

## 2021-04-30 MED ORDER — BUDESONIDE-FORMOTEROL FUMARATE 80-4.5 MCG/ACT IN AERO: 2.0000 | INHALATION_SPRAY | Freq: Two times a day (BID) | RESPIRATORY_TRACT | 3 refills | Status: DC

## 2021-04-30 NOTE — Patient Instructions (Addendum)
Urgent referral to cardiology, gastroenterology  Let us know if you dont hear back within a week in regards to an appointment being scheduled.   If you have recurrence of dizziness, please go to emergency room Please refrain from driving until we can get you in with cardiology for safety  Return stool cards  Start symbicort  Please stop smoking.   Let me know how you are doing

## 2021-04-30 NOTE — Telephone Encounter (Signed)
FYI Dr Charlestine Night, [place]   Dr End ,   Sorry for confusion, I had paged Dr Charlestine Night who was listed on call from [facility] and we discussed patient's dizziness in setting of bradycardia ( 42 today).   Dr Charlestine Night was unable to see EKG from today as for some reason not loaded in Epic and faxed to your office.  Dr Charlestine Night reviewed prior EKGs and bradycardia was not new.  Please let me know when patient can be scheduled and happy to speak with you, your office at any time.

## 2021-04-30 NOTE — Telephone Encounter (Signed)
pcp office calling for urgent referral appt with cards.     [name] would like to speak with DOD .     EKG to be faxed or uploaded asap for md review.   Patient c/ dizziness and sob with Loletha Grayer on EKG.    Dr. Saunders Revel aware and will call provider to discuss at [phone]

## 2021-04-30 NOTE — Progress Notes (Signed)
Subjective:    Patient ID: [name], male    DOB: 04-02-1951, 70 y.o.   MRN: [id number]  CC: [name] is a 70 y.o. male who presents today for follow up.   HPI: Complains of productive cough and congestion x several months.  Endorses sob.   Using flonase without relief.   He can be very physically active with yard work 'all I want and if I go to far' will feel   Smokes a cigar 'all day long.'   He also complains of loose stools, diarrhea 6 months. He reports known hemorrhoids and last week saw BRB in toilet bowel after he attempted to 'push hemorrhoid back in'.   No constipation. In the mornings, typically notices that first stool will be soft formed and then progress to loose brown to watery brown. This is occurs every morning.  Drinking coffee one cup every day.  Eating well and appetite good. Weight is stable.   No abdominal pain, blood in stool,  fever, night sweats.  No recent antibiotics.   He complains of intermittent dizziness when changing position. This doesn't occur when he is sitting.  This has been going on for some time and reports episode this morning when felt lightheaded upon standing.   No CP, syncope, leg swelling.    H/o CLL- had followed with Dr Tasia Catchings in past , last seen almost one year ago.  He is veteran and for financial reasons [facility] however has not been able to make an appointment.   HLD- compliant with pravachol   b12 deficiency- compliant with monthly b12 injections. Je feels increase in energy and wants to continue.   Colonoscopy 07/2015 without hemorrhoids  04/2020 CT lung cancer screen HISTORY:  Past Medical History:  Diagnosis Date  . Anemia 2008  . Arthritis   . Cervicalgia   . 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 Tasia Catchings  . Hyperlipidemia   . Low back pain   . Peripheral vascular disease [facility])    Past Surgical History:  Procedure Laterality Date  .  COLONOSCOPY    . COLONOSCOPY WITH PROPOFOL N/A 07/23/2015   Procedure: COLONOSCOPY WITH PROPOFOL;  Surgeon: [name], MD;  Location[facility] ENDOSCOPY;  Service: Endoscopy;  Laterality: N/A;  . ESOPHAGOGASTRODUODENOSCOPY (EGD) WITH PROPOFOL N/A 08/20/2015   Procedure: ESOPHAGOGASTRODUODENOSCOPY (EGD) WITH PROPOFOL;  Surgeon: [name], MD;  Location: [facility] ENDOSCOPY;  Service: Endoscopy;  Laterality: N/A;  . LOWER EXTREMITY ANGIOGRAPHY Left 05/19/2018   Procedure: LOWER EXTREMITY ANGIOGRAPHY;  Surgeon: Algernon Huxley, MD;  Location: [place] CV LAB;  Service: Cardiovascular;  Laterality: Left;  . LOWER EXTREMITY ANGIOGRAPHY Right 06/15/2018   Procedure: LOWER EXTREMITY ANGIOGRAPHY;  Surgeon: Algernon Huxley, MD;  Location: [place] CV LAB;  Service: Cardiovascular;  Laterality: Right;  . lymp node removal Right 2011   arm   left arm 2015  . LYMPH NODE BIOPSY  08/2014   Family History  Problem Relation Age of Onset  . Heart attack Father   . Kidney cancer Neg Hx        lung cancer  . Bladder Cancer Neg Hx   . Prostate cancer Neg Hx     Allergies: Latex Current Outpatient Medications on File Prior to Visit  Medication Sig Dispense Refill  . famotidine (PEPCID) 10 MG tablet Take 1 tablet (10 mg total) by mouth 2 (two) times daily. 60 tablet  0  . FLUoxetine (PROZAC) 20 MG tablet Take 1 tablet by mouth daily.    . fluticasone (FLONASE) 50 MCG/ACT nasal spray Place into the nose.    . ibuprofen (ADVIL,MOTRIN) 200 MG tablet Take 200 mg by mouth every 6 (six) hours as needed.    . pravastatin (PRAVACHOL) 40 MG tablet Take 1 tablet (40 mg total) by mouth daily. 90 tablet 3   No current facility-administered medications on file prior to visit.    Social History   Tobacco Use  . Smoking status: Current Every Day Smoker    Packs/day: 1.00    Years: 49.00    Pack years: 49.00    Types: Cigars  . Smokeless tobacco: Never Used  . Tobacco comment: .5 ppd last year, total of  48.5 pack year  Substance Use Topics  . Alcohol use: No  . Drug use: No    Review of Systems  Constitutional: Positive for fatigue. Negative for chills and fever.  HENT: Positive for congestion.   Respiratory: Positive for cough and shortness of breath.   Cardiovascular: Negative for chest pain and palpitations.  Gastrointestinal: Positive for anal bleeding and diarrhea. Negative for abdominal pain, blood in stool, nausea and vomiting.  Neurological: Positive for dizziness.      Objective:    BP 120/62 (BP Location: Left Arm, Patient Position: Sitting, Cuff Size: Normal)   Pulse (!) 54   Temp 98.5 F (36.9 C) (Oral)   Ht 5' 7.99" (1.727 m)   Wt 127 lb 12.8 oz (58 kg)   SpO2 98%   BMI 19.44 kg/m  BP Readings from Last 3 Encounters:  05/01/21 120/60  04/30/21 120/62  05/07/20 122/74   Wt Readings from Last 3 Encounters:  05/01/21 128 lb 8 oz (58.3 kg)  04/30/21 127 lb 12.8 oz (58 kg)  07/24/20 128 lb (58.1 kg)    Physical Exam Vitals reviewed.  Constitutional:      Appearance: He is well-developed.  Cardiovascular:     Rate and Rhythm: Regular rhythm.     Heart sounds: Normal heart sounds.  Pulmonary:     Effort: Pulmonary effort is normal. No respiratory distress.     Breath sounds: Normal breath sounds. No wheezing, rhonchi or rales.  Abdominal:     General: Abdomen is flat. Bowel sounds are normal.     Tenderness: There is no abdominal tenderness. There is no guarding or rebound.  Genitourinary:    Rectum: No external hemorrhoid.  Skin:    General: Skin is warm and dry.  Neurological:     Mental Status: He is alert.  Psychiatric:        Speech: Speech normal.        Behavior: Behavior normal.        Assessment & Plan:   Problem List Items Addressed This Visit      Respiratory   Emphysema lung ([place])    Uncontrolled. Sa02 remained 99 while we walked. No respiratory distress or labored breathing while we walked. Pending cxr and then also due for CT  lung cancer screen. Concern DOE related to cardiac etiology. Long discussion as it relates to progressive nature of copd and he would like to trial to quit smoking a cigar.  Trial of symbicort.       Relevant Medications   budesonide-formoterol (SYMBICORT) 80-4.5 MCG/ACT inhaler   Other Relevant Orders   DG Chest 2 View     Other   Bradycardia - Primary   Relevant Orders  B12 and Folate Panel (Completed)   Intrinsic Factor Antibodies   Methylmalonic acid, serum   Homocysteine (Completed)   Lipid panel (Completed)   CBC with Differential/Platelet (Completed)   Comprehensive metabolic panel (Completed)   TSH (Completed)   Gastrointestinal Pathogen Panel PCR   Ambulatory referral to Gastroenterology   ECHOCARDIOGRAM COMPLETE   EKG 12-Lead (Completed)   Ambulatory referral to Cardiology   DG Chest 2 View   Chronic lymphocytic leukemia (CLL), 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 Concerned with fatigue and multitude of symptoms today. Dr Collie Siad office has reached out to patient to schedule. Will follow.       Diarrhea    New, uncontrolled. 6  Months duration. Weight is stable. Benign abdominal exam. Pending GI pathogen panel and consult with GI for diarrhea as well as colonoscopy.       Relevant Orders   CBC with Differential/Platelet (Completed)   Comprehensive metabolic panel (Completed)   TSH (Completed)   Gastrointestinal Pathogen Panel PCR   Ambulatory referral to Gastroenterology   Dizziness    Uncontrolled. Concern related bradycardia. Blood pressure is low end however not orthostatic on exam. EKG shows sinus bradycardia. No acute ischemia or change when compared to prior EKG 2/20.  Counseled on being careful with position changes, staying well hydrated. Discussed case with Dr End whom felt bradycardia had been chronic for many years. Urgent referral to cardiology placed.        Other Visit Diagnoses    B12 deficiency       Screening for prostate cancer        Relevant Orders   PSA (Completed)       I have discontinued Madeline Heimsoth's azithromycin. I am also having him start on budesonide-formoterol. Additionally, I am having him maintain his ibuprofen, pravastatin, FLUoxetine, fluticasone, and famotidine.   Meds ordered this encounter  Medications  . budesonide-formoterol (SYMBICORT) 80-4.5 MCG/ACT inhaler    Sig: Inhale 2 puffs into the lungs 2 (two) times daily.    Dispense:  1 each    Refill:  3    Order Specific Question:   Supervising Provider    Answer:   Crecencio Mc [[id number]]    Return precautions given.   Risks, benefits, and alternatives of the medications and treatment plan prescribed today were discussed, and patient expressed understanding.   Education regarding symptom management and diagnosis given to patient on AVS.  Continue to follow with [name], FNP for routine health maintenance.   Earney Hamburg and I agreed with plan.   [name], FNP

## 2021-04-30 NOTE — Assessment & Plan Note (Addendum)
Uncontrolled. Concern related bradycardia. Blood pressure is low end however not orthostatic on exam. EKG shows sinus bradycardia. No acute ischemia or change when compared to prior EKG 2/20.  Counseled on being careful with position changes, staying well hydrated. Discussed case with Dr End whom felt bradycardia had been chronic for many years. Urgent referral to cardiology placed.

## 2021-05-01 ENCOUNTER — Telehealth: Payer: Self-pay

## 2021-05-01 ENCOUNTER — Encounter: Payer: Self-pay | Admitting: Cardiovascular Disease

## 2021-05-01 ENCOUNTER — Ambulatory Visit (INDEPENDENT_AMBULATORY_CARE_PROVIDER_SITE_OTHER): Payer: Self-pay | Admitting: Cardiovascular Disease

## 2021-05-01 VITALS — BP 120/60 | HR 52 | Ht 68.0 in | Wt 128.5 lb

## 2021-05-01 DIAGNOSIS — I25118 Atherosclerotic heart disease of native coronary artery with other forms of angina pectoris: Secondary | ICD-10-CM

## 2021-05-01 DIAGNOSIS — R079 Chest pain, unspecified: Secondary | ICD-10-CM

## 2021-05-01 DIAGNOSIS — R001 Bradycardia, unspecified: Secondary | ICD-10-CM

## 2021-05-01 DIAGNOSIS — R42 Dizziness and giddiness: Secondary | ICD-10-CM

## 2021-05-01 DIAGNOSIS — R0602 Shortness of breath: Secondary | ICD-10-CM

## 2021-05-01 DIAGNOSIS — I739 Peripheral vascular disease, unspecified: Secondary | ICD-10-CM

## 2021-05-01 DIAGNOSIS — I209 Angina pectoris, unspecified: Secondary | ICD-10-CM

## 2021-05-01 DIAGNOSIS — J439 Emphysema, unspecified: Secondary | ICD-10-CM

## 2021-05-01 DIAGNOSIS — I2510 Atherosclerotic heart disease of native coronary artery without angina pectoris: Secondary | ICD-10-CM

## 2021-05-01 LAB — COMPREHENSIVE METABOLIC PANEL
ALT: 9 U/L (ref 0–53)
AST: 15 U/L (ref 0–37)
Albumin: 4.2 g/dL (ref 3.5–5.2)
Alkaline Phosphatase: 42 U/L (ref 39–117)
BUN: 8 mg/dL (ref 6–23)
CO2: 30 mEq/L (ref 19–32)
Calcium: 9.1 mg/dL (ref 8.4–10.5)
Chloride: 106 mEq/L (ref 96–112)
Creatinine, Ser: 1.17 mg/dL (ref 0.40–1.50)
GFR: 64.37 mL/min (ref >60.00)
Glucose, Bld: 121 mg/dL — ABNORMAL HIGH (ref 70–99)
Potassium: 4.2 mEq/L (ref 3.5–5.1)
Sodium: 140 mEq/L (ref 135–145)
Total Bilirubin: 0.7 mg/dL (ref 0.2–1.2)
Total Protein: 6.1 g/dL (ref 6.0–8.3)

## 2021-05-01 LAB — LIPID PANEL
Cholesterol: 165 mg/dL (ref 0–200)
HDL: 38.9 mg/dL — ABNORMAL LOW (ref >39.00)
LDL Cholesterol: 109 mg/dL — ABNORMAL HIGH (ref 0–99)
NonHDL: 126.43
Total CHOL/HDL Ratio: 4
Triglycerides: 88 mg/dL (ref 0.0–149.0)
VLDL: 17.6 mg/dL (ref 0.0–40.0)

## 2021-05-01 LAB — CBC WITH DIFFERENTIAL/PLATELET
Basophils Absolute: 0.3 10*3/uL — ABNORMAL HIGH (ref 0.0–0.1)
Basophils Relative: 1.2 % (ref 0.0–3.0)
Eosinophils Absolute: 0.3 10*3/uL (ref 0.0–0.7)
Eosinophils Relative: 1 % (ref 0.0–5.0)
HCT: 35.8 % — ABNORMAL LOW (ref 39.0–52.0)
Hemoglobin: 11.3 g/dL — ABNORMAL LOW (ref 13.0–17.0)
Lymphocytes Relative: 83.2 % — ABNORMAL HIGH (ref 12.0–46.0)
Lymphs Abs: 22.7 10*3/uL — ABNORMAL HIGH (ref 0.7–4.0)
MCHC: 31.7 g/dL (ref 30.0–36.0)
MCV: 78.1 fl (ref 78.0–100.0)
Monocytes Absolute: 0.8 10*3/uL (ref 0.1–1.0)
Monocytes Relative: 2.9 % — ABNORMAL LOW (ref 3.0–12.0)
Neutro Abs: 3.2 10*3/uL (ref 1.4–7.7)
Neutrophils Relative %: 11.7 % — ABNORMAL LOW (ref 43.0–77.0)
Platelets: 188 10*3/uL (ref 150.0–400.0)
RBC: 4.58 Mil/uL (ref 4.22–5.81)
RDW: 16.6 % — ABNORMAL HIGH (ref 11.5–15.5)
WBC: 27.2 10*3/uL (ref 4.0–10.5)

## 2021-05-01 LAB — B12 AND FOLATE PANEL
Folate: 4.3 ng/mL — ABNORMAL LOW (ref >5.9)
Vitamin B-12: 455 pg/mL (ref 211–911)

## 2021-05-01 LAB — PSA: PSA: 1.63 ng/mL (ref 0.10–4.00)

## 2021-05-01 LAB — TSH: TSH: 2.75 u[IU]/mL (ref 0.35–4.50)

## 2021-05-01 NOTE — Telephone Encounter (Signed)
**  Critical LAB**   Critical White count 27.2

## 2021-05-01 NOTE — Telephone Encounter (Signed)
Thank you so much Dr. Tasia Catchings!

## 2021-05-01 NOTE — Telephone Encounter (Signed)
Attempted to schedule.  No voicemail.

## 2021-05-01 NOTE — Telephone Encounter (Signed)
Elevated WBC could be consistent with leukemia  Cc;ed Dr. Tasia Catchings

## 2021-05-01 NOTE — Patient Instructions (Addendum)
Medication Instructions:  No changes  If you need a refill on your cardiac medications before your next appointment, please call your pharmacy.    Lab work: No new labs needed   If you have labs (blood work) drawn today and your tests are completely normal, you will receive your results only by: [name] MyChart Message (if you have MyChart) OR . A paper copy in the mail If you have any lab test that is abnormal or we need to change your treatment, we will call you to review the results.   Testing/Procedures: Cardiac CTA (for angina and shortness of breath) to be performed once Medicare coverage has been approved.  Your cardiac CT will be scheduled at one of the below locations:   [facility] [address] [place], [place] [address] [phone]  If scheduled at [facility], please arrive 15 mins early for check-in and test prep.  Please follow these instructions carefully (unless otherwise directed):  Hold all erectile dysfunction medications at least 3 days (72 hrs) prior to test.  On the Night Before the Test: . Be sure to Drink plenty of water. . Do not consume any caffeinated/decaffeinated beverages or chocolate 12 hours prior to your test. . Do not take any antihistamines 12 hours prior to your test.  On the Day of the Test: . Drink plenty of water until 1 hour prior to the test. . Do not eat any food 4 hours prior to the test. . You may take your regular medications prior to the test.       After the Test: . Drink plenty of water. . After receiving IV contrast, you may experience a mild flushed feeling. This is normal. . On occasion, you may experience a mild rash up to 24 hours after the test. This is not dangerous. If this occurs, you can take Benadryl 25 mg and increase your fluid intake. . If you experience trouble breathing, this can be serious. If it is severe call 911 IMMEDIATELY. If it is  mild, please call our office. . If you take any of these medications: Glipizide/Metformin, Avandament, Glucavance, please do not take 48 hours after completing test unless otherwise instructed.  Once we have confirmed authorization from your insurance company, we will call you to set up a date and time for your test. Based on how quickly your insurance processes prior authorizations requests, please allow up to 4 weeks to be contacted for scheduling your Cardiac CT appointment. Be advised that routine Cardiac CT appointments could be scheduled as many as 8 weeks after your provider has ordered it.   For scheduling needs, including cancellations and rescheduling, please call [place], [phone].  Follow-Up: At [facility], you and your health needs are our priority.  As part of our continuing mission to provide you with exceptional heart care, we have created designated Provider Care Teams.  These Care Teams include your primary Cardiologist (physician) and Advanced Practice Providers (APPs -  Physician Assistants and Nurse Practitioners) who all work together to provide you with the care you need, when you need it.  . You will need a follow up appointment as needed . We will call with results  . Providers on your designated Care Team:   . Murray Hodgkins, NP . Christell Faith, PA-C . Marrianne Mood, PA-C  Any Other Special Instructions Will Be Listed Below (If Applicable).  COVID-19 Vaccine Information can be found at: [email or web address].co.uk For questions related to vaccine  distribution or appointments, please email [email or web address] or call [phone].

## 2021-05-01 NOTE — Progress Notes (Signed)
Cardiology Office Note  Date:  05/01/2021   ID:  [name], DOB [birth year], MRN [id number]  PCP:  [name], FNP   Chief Complaint  Patient presents with  . Other    Bradycardia. Meds reviewed verbally with pt.    HPI:  [name] is a 70 year old gentleman with past medical history of CLL Hyperlipidemia PAD Smoker, former, no cigs in 30 years, now cigars Presenting by referral from [name] for consultation of his bradycardia, dizziness, shortness of breath, chest tightness  EKG in the PMD office heart rate 42 bpm EKG not available for review Notes indicating a History of longstanding bradycardia some heart rates into the low to mid 40s Currently not on any rate controlling agents  In follow-up today reports having significant peripheral arterial disease, stents to both legs bilaterally as detailed below  Feels he is having worsening SOB, low energy, some chest tightness Has to stop from time to time when working in the backyard to catch his breath, secondary to chest pain and energy  CT chest: screening study images pulled up and reviewed 2 vessel CAD, mild to moderate calcification  Peripheral disease history June 2019 = left PTA of SFA to Pop and stent  May 2019 = Right PTA of SFA to pop with stent in SFA and stent in Pop.   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  PSH:    Past Surgical History:  Procedure Laterality Date  . COLONOSCOPY    . COLONOSCOPY WITH PROPOFOL N/A 07/23/2015   Procedure: COLONOSCOPY WITH PROPOFOL;  Surgeon: [name], MD;  Location:  [facility] ENDOSCOPY;  Service: Endoscopy;  Laterality: N/A;  . ESOPHAGOGASTRODUODENOSCOPY (EGD) WITH PROPOFOL N/A 08/20/2015   Procedure: ESOPHAGOGASTRODUODENOSCOPY (EGD) WITH PROPOFOL;  Surgeon: [name], MD;  Location: [facility] ENDOSCOPY;  Service: Endoscopy;  Laterality: N/A;  . LOWER EXTREMITY ANGIOGRAPHY Left 05/19/2018   Procedure: LOWER EXTREMITY ANGIOGRAPHY;  Surgeon: Algernon Huxley, MD;  Location: [place] CV LAB;  Service: Cardiovascular;  Laterality: Left;  . LOWER EXTREMITY ANGIOGRAPHY Right 06/15/2018   Procedure: LOWER EXTREMITY ANGIOGRAPHY;  Surgeon: Algernon Huxley, MD;  Location: [place] CV LAB;  Service: Cardiovascular;  Laterality: Right;  . lymp node removal Right 2011   arm   left arm 2015  . LYMPH NODE BIOPSY  08/2014    Current Outpatient Medications  Medication Sig Dispense Refill  . aspirin EC 81 MG tablet Take 1 tablet (81 mg total) by mouth daily. Swallow whole. 90 tablet 3  . budesonide-formoterol (SYMBICORT) 80-4.5 MCG/ACT inhaler Inhale 2 puffs into the lungs 2 (two) times daily. 1 each 3  . famotidine (PEPCID) 10 MG tablet Take 1 tablet (10 mg total) by mouth 2 (two) times daily. 60 tablet 0  . FLUoxetine (PROZAC) 20 MG tablet Take 1 tablet by mouth daily.    . fluticasone (FLONASE) 50 MCG/ACT nasal spray Place into the nose.    . ibuprofen (ADVIL,MOTRIN) 200 MG tablet Take 200 mg by mouth every 6 (six) hours as needed.    . pravastatin (PRAVACHOL) 40 MG tablet Take 1 tablet (40 mg total) by mouth daily. 90 tablet  3   No current facility-administered medications for this visit.     Allergies:   Latex   Social History:  The patient  reports that he has been smoking cigars. He has a 49.00 pack-year smoking history. He has never used smokeless tobacco. He reports that he does not drink alcohol and does not use drugs.   Family History:   family history includes Heart attack in his father.    Review of Systems: Review of Systems  Constitutional:  Negative.   HENT: Negative.   Respiratory: Negative.   Cardiovascular: Negative.   Gastrointestinal: Negative.   Musculoskeletal: Negative.   Neurological: Negative.   Psychiatric/Behavioral: Negative.   All other systems reviewed and are negative.   PHYSICAL EXAM: VS:  BP 120/60 (BP Location: Right Arm, Patient Position: Sitting, Cuff Size: Normal)   Pulse (!) 52   Ht 5\' 8"  (1.727 m)   Wt 128 lb 8 oz (58.3 kg)   SpO2 98%   BMI 19.54 kg/m  , BMI Body mass index is 19.54 kg/m. GEN: Well nourished, well developed, in no acute distress HEENT: normal Neck: no JVD, carotid bruits, or masses Cardiac: RRR; no murmurs, rubs, or gallops,no edema  Respiratory:  clear to auscultation bilaterally, normal work of breathing GI: soft, nontender, nondistended, + BS MS: no deformity or atrophy Skin: warm and dry, no rash Neuro:  Strength and sensation are intact Psych: euthymic mood, full affect   Recent Labs: 04/30/2021: ALT 9; BUN 8; Creatinine, Ser 1.17; Hemoglobin 11.3; Platelets 188.0; Potassium 4.2; Sodium 140; TSH 2.75    Lipid Panel Lab Results  Component Value Date   CHOL 165 04/30/2021   HDL 38.90 (L) 04/30/2021   LDLCALC 109 (H) 04/30/2021   TRIG 88.0 04/30/2021      Wt Readings from Last 3 Encounters:  05/01/21 128 lb 8 oz (58.3 kg)  04/30/21 127 lb 12.8 oz (58 kg)  07/24/20 128 lb (58.1 kg)      ASSESSMENT AND PLAN:  Problem List Items Addressed This Visit      Cardiology Problems   Coronary atherosclerosis   Relevant Medications   aspirin EC 81 MG tablet   Other Relevant Orders   Cardiac Stress Test: Informed Consent Details: Physician/Practitioner Attestation; Transcribe to consent form and obtain patient signature   Peripheral vascular disease ([place])   Relevant Medications   aspirin EC 81 MG tablet   Other Relevant Orders   EKG 12-Lead   Cardiac Stress Test: Informed Consent Details: Physician/Practitioner Attestation; Transcribe to consent form and  obtain patient signature     Other   Bradycardia   Dizziness   Emphysema lung ([place])    Other Visit Diagnoses    Angina pectoris ([place])    -  Primary   Relevant Medications   aspirin EC 81 MG tablet   Other Relevant Orders   EKG 12-Lead   CT CORONARY MORPH W/CTA COR W/SCORE W/CA W/CM &/OR WO/CM   Cardiac Stress Test: Informed Consent Details: Physician/Practitioner Attestation; Transcribe to consent form and obtain patient signature   SOB (shortness of breath)       Relevant Orders   EKG 12-Lead   CT CORONARY MORPH W/CTA COR W/SCORE W/CA W/CM &/OR WO/CM   Cardiac Stress Test: Informed Consent Details: Physician/Practitioner Attestation; Transcribe to consent form and obtain patient signature   Coronary artery disease of native artery of native heart with stable angina pectoris (HCC)       Relevant Medications   aspirin EC 81  MG tablet   Other Relevant Orders   EKG 12-Lead   Chest pain, unspecified type       Relevant Orders   CT CORONARY MORPH W/CTA COR W/SCORE W/CA W/CM &/OR WO/CM     Chest pain/angina Coronary disease noted on CT scan chest Known peripheral arterial disease with stenting to legs bilaterally Reports progressive shortness of breath on exertion, Discussed potential options for ischemic work-up including cardiac CTA versus stress testing -Order placed for cardiac CTA to be done once he has signed up for his Medicare which he reports will be in place next week -Smoking cessation recommended  Smoker We have encouraged her to continue to work on weaning her cigarettes and smoking cessation. She will continue to work on this and does not want any assistance with chantix.   Hyperlipidemia Continue statin, may need to add Zetia 10 mg daily achieve goal LDL less than 10  PAD Followed by vein and vascular in [place], Smoking cessation recommended, goal LDL less than 70 Denies claudication symptoms  Bradycardia Unrelated to above symptoms, relatively  asymptomatic, blood pressure stable No indication for pacemaker Would allow liberal blood pressure    Total encounter time more than 45 minutes  Greater than 50% was spent in counseling and coordination of care with the patient    Signed, Esmond Plants, M.D., Ph.D. [place], [place]

## 2021-05-01 NOTE — Telephone Encounter (Signed)
EKGs personally reviewed, with tracing performed in the clinic on 04/30/2021 demonstrating sinus bradycardia (heart rate 42 bpm) with nonspecific T wave changes.  Review of prior EKGs and vital signs is notable for longstanding bradycardia with some heart rates into the low and mid 40s.  I have spoken with Ms. Arnett regarding these findings and think it is reasonable for Mr. Kiester to be seen in our office for further evaluation at his earliest convenience.  Nelva Bush, MD [facility]

## 2021-05-01 NOTE — Telephone Encounter (Signed)
Is pt feeling ok  Sch appt with PCP for f/u Friday or early next week in person  Order urine culture and have pt come in for this  If not feeling well should go to ED or [facility] urgent care   WBC ct is significantly elevated?

## 2021-05-01 NOTE — Telephone Encounter (Signed)
FYI

## 2021-05-01 NOTE — Telephone Encounter (Signed)
Pcp office called back this morning to reschedule asap visit.  Open visit available with gollan .  Added to schedule .  Coordinated with pcp office for visit today at 240 with provider but asked to have patient be here at 215 .

## 2021-05-01 NOTE — Telephone Encounter (Signed)
We saw patient yesterday. He is seeing cardiology today. He was not feeling well, but also has leukemia. He has followed with Dr. Tasia Catchings in the past.

## 2021-05-02 ENCOUNTER — Other Ambulatory Visit: Payer: Self-pay

## 2021-05-02 ENCOUNTER — Encounter: Payer: Self-pay | Admitting: Oncology

## 2021-05-02 NOTE — Assessment & Plan Note (Signed)
Overdue for follow up . Concerned with fatigue and multitude of symptoms today. Dr Collie Siad office has reached out to patient to schedule. Will follow.

## 2021-05-02 NOTE — Assessment & Plan Note (Addendum)
New, uncontrolled. 6  Months duration. Weight is stable. Benign abdominal exam. Pending GI pathogen panel and consult with GI for diarrhea as well as colonoscopy.

## 2021-05-02 NOTE — Telephone Encounter (Signed)
Please see Dr. Collie Siad scheduling recommendation:  He has CLL and has not been compliant with follow ups .  Last seen 1 year ago. Team please reach out to see if he wants to schedule MD visit.

## 2021-05-02 NOTE — Assessment & Plan Note (Addendum)
Uncontrolled. Sa02 remained 99 while we walked. No respiratory distress or labored breathing while we walked. Pending cxr and then also due for CT lung cancer screen. Concern DOE related to cardiac etiology. Long discussion as it relates to progressive nature of copd and he would like to trial to quit smoking a cigar.  Trial of symbicort.

## 2021-05-02 NOTE — Telephone Encounter (Signed)
noted 

## 2021-05-03 LAB — HOMOCYSTEINE: Homocysteine: 13.3 umol/L — ABNORMAL HIGH (ref <11.4)

## 2021-05-03 LAB — METHYLMALONIC ACID, SERUM: Methylmalonic Acid, Quant: 105 nmol/L (ref 87–318)

## 2021-05-03 LAB — INTRINSIC FACTOR ANTIBODIES: Intrinsic Factor: NEGATIVE

## 2021-05-05 ENCOUNTER — Telehealth: Payer: Self-pay

## 2021-05-05 NOTE — Telephone Encounter (Signed)
The office attempted to call patient to schedule appt on 05/01/21 but no answer.  Please try to call again.

## 2021-05-05 NOTE — Telephone Encounter (Signed)
-----   Message from Earlie Server, MD sent at 05/03/2021  3:52 PM EDT ----- Received labs forwarded by pcp. Please check if patient wants to follow up for his CLL.

## 2021-05-06 ENCOUNTER — Other Ambulatory Visit: Payer: Self-pay | Admitting: Family

## 2021-05-06 DIAGNOSIS — E538 Deficiency of other specified B group vitamins: Secondary | ICD-10-CM

## 2021-05-06 MED ORDER — FOLIC ACID 1 MG PO TABS: 1.0000 mg | ORAL_TABLET | Freq: Every day | ORAL | 3 refills | Status: DC

## 2021-05-07 ENCOUNTER — Other Ambulatory Visit: Payer: Self-pay

## 2021-05-07 ENCOUNTER — Ambulatory Visit: Payer: Self-pay

## 2021-05-07 ENCOUNTER — Ambulatory Visit: Admission: RE | Admit: 2021-05-07 | Payer: Medicare Other | Source: Home / Self Care

## 2021-05-07 DIAGNOSIS — E785 Hyperlipidemia, unspecified: Secondary | ICD-10-CM

## 2021-05-07 MED ORDER — PRAVASTATIN SODIUM 80 MG PO TABS: 80.0000 mg | ORAL_TABLET | Freq: Every day | ORAL | 3 refills | Status: DC

## 2021-05-12 ENCOUNTER — Other Ambulatory Visit: Payer: Self-pay

## 2021-05-12 ENCOUNTER — Ambulatory Visit
Admission: RE | Admit: 2021-05-12 | Discharge: 2021-05-12 | Disposition: A | Discharge status: Home / Self Care | Payer: Self-pay | Source: Ambulatory Visit | Attending: Family | Admitting: Family

## 2021-05-12 ENCOUNTER — Ambulatory Visit: Payer: Self-pay

## 2021-05-12 ENCOUNTER — Ambulatory Visit
Admission: RE | Admit: 2021-05-12 | Discharge: 2021-05-12 | Disposition: A | Discharge status: Home / Self Care | Payer: Self-pay | Attending: Family | Admitting: Family

## 2021-05-12 DIAGNOSIS — R001 Bradycardia, unspecified: Secondary | ICD-10-CM | POA: Insufficient documentation

## 2021-05-12 DIAGNOSIS — J439 Emphysema, unspecified: Secondary | ICD-10-CM | POA: Insufficient documentation

## 2021-05-13 ENCOUNTER — Inpatient Hospital Stay: Payer: Self-pay | Attending: Oncology | Admitting: Oncology

## 2021-05-28 ENCOUNTER — Telehealth (HOSPITAL_COMMUNITY): Payer: Self-pay | Admitting: Emergency Medicine

## 2021-05-28 ENCOUNTER — Telehealth: Payer: Self-pay

## 2021-05-28 NOTE — Telephone Encounter (Signed)
Attempted to contact patient to schedule annual lung screening CT scan. No answer, and voicemail is not set up.

## 2021-05-28 NOTE — Telephone Encounter (Signed)
Unable to leave vm Gean Laursen RN Navigator Cardiac Imaging [facility] [phone] Office  [phone] Cell  

## 2021-05-29 ENCOUNTER — Ambulatory Visit: Admission: RE | Admit: 2021-05-29 | Payer: Self-pay | Source: Ambulatory Visit

## 2021-06-03 ENCOUNTER — Telehealth (HOSPITAL_COMMUNITY): Payer: Self-pay | Admitting: Emergency Medicine

## 2021-06-03 NOTE — Telephone Encounter (Signed)
Unable to leave vm Taevin Mcferran RN Navigator Cardiac Imaging [facility] [phone] Office  [phone] Cell  

## 2021-06-04 ENCOUNTER — Ambulatory Visit: Payer: Self-pay | Admitting: Family

## 2021-06-05 ENCOUNTER — Other Ambulatory Visit: Payer: Self-pay | Admitting: Cardiovascular Disease

## 2021-06-05 ENCOUNTER — Ambulatory Visit
Admission: RE | Admit: 2021-06-05 | Discharge: 2021-06-05 | Disposition: A | Discharge status: Home / Self Care | Payer: Self-pay | Source: Ambulatory Visit | Attending: Oncology | Admitting: Oncology

## 2021-06-05 ENCOUNTER — Ambulatory Visit
Admission: RE | Admit: 2021-06-05 | Discharge: 2021-06-05 | Disposition: A | Discharge status: Home / Self Care | Payer: Self-pay | Source: Ambulatory Visit | Attending: Cardiovascular Disease | Admitting: Cardiovascular Disease

## 2021-06-05 ENCOUNTER — Other Ambulatory Visit: Payer: Self-pay

## 2021-06-05 ENCOUNTER — Other Ambulatory Visit: Payer: Self-pay | Admitting: *Deleted

## 2021-06-05 DIAGNOSIS — I2510 Atherosclerotic heart disease of native coronary artery without angina pectoris: Secondary | ICD-10-CM

## 2021-06-05 DIAGNOSIS — Z122 Encounter for screening for malignant neoplasm of respiratory organs: Secondary | ICD-10-CM

## 2021-06-05 DIAGNOSIS — R931 Abnormal findings on diagnostic imaging of heart and coronary circulation: Secondary | ICD-10-CM | POA: Insufficient documentation

## 2021-06-05 DIAGNOSIS — R079 Chest pain, unspecified: Secondary | ICD-10-CM | POA: Insufficient documentation

## 2021-06-05 DIAGNOSIS — R0602 Shortness of breath: Secondary | ICD-10-CM | POA: Insufficient documentation

## 2021-06-05 DIAGNOSIS — I209 Angina pectoris, unspecified: Secondary | ICD-10-CM | POA: Insufficient documentation

## 2021-06-05 DIAGNOSIS — Z87891 Personal history of nicotine dependence: Secondary | ICD-10-CM

## 2021-06-05 LAB — POCT I-STAT CREATININE: Creatinine, Ser: 1 mg/dL (ref 0.61–1.24)

## 2021-06-05 MED ORDER — IOHEXOL 350 MG/ML SOLN
75.0000 mL | Freq: Once | INTRAVENOUS | Status: AC | PRN
  Administered 2021-06-05: 75 mL via INTRAVENOUS

## 2021-06-05 MED ORDER — NITROGLYCERIN 0.4 MG SL SUBL
0.8000 mg | SUBLINGUAL_TABLET | Freq: Once | SUBLINGUAL | Status: AC
  Administered 2021-06-05: 0.8 mg via SUBLINGUAL

## 2021-06-05 NOTE — Progress Notes (Signed)
Patient tolerated CT well. Drank water and ate peanut butter crackers after. Vital signs stable encourage to drink water throughout day.Reasons explained and verbalized understanding. Ambulated steady gait.  

## 2021-06-10 ENCOUNTER — Ambulatory Visit (INDEPENDENT_AMBULATORY_CARE_PROVIDER_SITE_OTHER): Payer: Self-pay

## 2021-06-10 ENCOUNTER — Other Ambulatory Visit: Payer: Self-pay

## 2021-06-10 DIAGNOSIS — E538 Deficiency of other specified B group vitamins: Secondary | ICD-10-CM

## 2021-06-10 MED ORDER — CYANOCOBALAMIN 1000 MCG/ML IJ SOLN
1000.0000 ug | Freq: Once | INTRAMUSCULAR | Status: AC
  Administered 2021-06-10: 1000 ug via INTRAMUSCULAR

## 2021-06-10 NOTE — Progress Notes (Signed)
Patient presented for B 12 injection to left deltoid, patient voiced no concerns nor showed any signs of distress during injection. 

## 2021-06-16 ENCOUNTER — Other Ambulatory Visit: Payer: Self-pay

## 2021-06-17 ENCOUNTER — Telehealth: Payer: Self-pay

## 2021-06-17 NOTE — Telephone Encounter (Signed)
Attempted to call pt via phone, cannot make contact, unable to LM d/t VM not set up. DPR advised can't speak to anyone regarding his medical info.  Scheduling attempted to reach pt this am for an appt for DOD today, could not reach pt.  Will attempt to call pt at a later time and schedule an appt

## 2021-06-18 NOTE — Telephone Encounter (Signed)
Attempted to call patient to schedule, unable to leave voicemail. Letter sent to call and schedule.

## 2021-06-19 ENCOUNTER — Telehealth: Payer: Self-pay

## 2021-06-19 NOTE — Telephone Encounter (Signed)
3rd attempt to reach out to Mr. Grewell, no answer on number provided. Cannot leave message, phone rings out to VM that is not set up in order to LDM.

## 2021-06-24 ENCOUNTER — Encounter: Payer: Self-pay | Admitting: Cardiovascular Disease

## 2021-06-25 ENCOUNTER — Encounter: Payer: Self-pay | Admitting: *Deleted

## 2021-06-30 ENCOUNTER — Telehealth: Payer: Self-pay | Admitting: Family

## 2021-06-30 NOTE — Telephone Encounter (Signed)
Just FYI Dr Tasia Catchings. Hope you are well.    [name],  Call pt  His CT lung cancer screening returned, stable lung findings. Lung nodules unchanged from prior. Emphysema. He will need to continue to screen annually.   CT chest also showed prominent bilateral axillary lymph nodes are again noted. He is very overdue for follow up for CLL with Dr Tasia Catchings. Her office reached out to him to schedule 05/01/21. Please give patient number ( [phone])  and strongly encourage him to call to schedule follow up so that she may monitor CLL including surveillance of lymph nodes again noted on CT chest.

## 2021-06-30 NOTE — Telephone Encounter (Signed)
I called patient, but no VM was set up to LM.

## 2021-06-30 NOTE — Telephone Encounter (Signed)
-----   Message from Lieutenant Diego, RN sent at 06/25/2021  9:40 AM EDT ----- We will send interpretation of these results to the patient. Please look in Epic Letters to see results letter. We will also contact them close to the time their annual lung screening scan is due next year for scheduling.  Thank you, Raquel Sarna

## 2021-07-01 NOTE — Telephone Encounter (Signed)
Mail letter to pt  [name], Your CT lung cancer screening returned, stable lung findings. Lung nodules unchanged from prior. Emphysema. You will need to continue to screen annually.   CT chest also showed prominent bilateral axillary lymph nodes are again noted. You are very overdue for follow up for CLL with Dr Tasia Catchings.   Her office reached out to you to schedule 05/01/21. Please give her office a  call  at ( [phone], and I strongly encourage you to call to schedule follow up so that she may monitor CLL including surveillance of lymph nodes again noted on CT chest.  Hope you are doing well.  Catalina Antigua , NP

## 2021-07-01 NOTE — Telephone Encounter (Signed)
Letter sent to patient.

## 2021-07-09 ENCOUNTER — Ambulatory Visit: Payer: Self-pay | Admitting: Family

## 2021-07-24 NOTE — Progress Notes (Deleted)
Cardiology Office Note  Date:  07/24/2021   ID:  [name], DOB 27-Nov-1951, MRN [id number]  PCP:  Joseph Hawthorne, FNP   No chief complaint on file.   HPI:  [name] is a 70 year old gentleman with past medical history of CLL Hyperlipidemia PAD Smoker, former, no cigs in 30 years, now cigars Presenting by referral from [name] for consultation of his bradycardia, dizziness, shortness of breath, chest tightness  EKG in the PMD office heart rate 42 bpm EKG not available for review Notes indicating a History of longstanding bradycardia some heart rates into the low to mid 40s Currently not on any rate controlling agents  In follow-up today reports having significant peripheral arterial disease, stents to both legs bilaterally as detailed below  Feels he is having worsening SOB, low energy, some chest tightness Has to stop from time to time when working in the backyard to catch his breath, secondary to chest pain and energy  CT chest: screening study images pulled up and reviewed 2 vessel CAD, mild to moderate calcification  Peripheral disease history June 2019 = left PTA of SFA to Pop and stent  May 2019 = Right PTA of SFA to pop with stent in SFA and stent in Pop.   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  PSH:    Past Surgical History:  Procedure Laterality Date   COLONOSCOPY     COLONOSCOPY WITH PROPOFOL N/A 07/23/2015   Procedure: COLONOSCOPY WITH PROPOFOL;  Surgeon: [name], MD;  Location: [facility] ENDOSCOPY;  Service: Endoscopy;  Laterality: N/A;    ESOPHAGOGASTRODUODENOSCOPY (EGD) WITH PROPOFOL N/A 08/20/2015   Procedure: ESOPHAGOGASTRODUODENOSCOPY (EGD) WITH PROPOFOL;  Surgeon: [name], MD;  Location: [facility] ENDOSCOPY;  Service: Endoscopy;  Laterality: N/A;   LOWER EXTREMITY ANGIOGRAPHY Left 05/19/2018   Procedure: LOWER EXTREMITY ANGIOGRAPHY;  Surgeon: Algernon Huxley, MD;  Location: [place] CV LAB;  Service: Cardiovascular;  Laterality: Left;   LOWER EXTREMITY ANGIOGRAPHY Right 06/15/2018   Procedure: LOWER EXTREMITY ANGIOGRAPHY;  Surgeon: Algernon Huxley, MD;  Location: [place] CV LAB;  Service: Cardiovascular;  Laterality: Right;   lymp node removal Right 2011   arm   left arm 2015   LYMPH NODE BIOPSY  08/2014    Current Outpatient Medications  Medication Sig Dispense Refill   pravastatin (PRAVACHOL) 80 MG tablet Take 1 tablet (80 mg total) by mouth daily. 90 tablet 3   aspirin EC 81 MG tablet Take 1 tablet (81 mg total) by mouth daily. Swallow whole. 90 tablet 3   budesonide-formoterol (SYMBICORT) 80-4.5 MCG/ACT inhaler Inhale 2 puffs into the lungs 2 (two) times daily. 1 each 3   famotidine (PEPCID) 10 MG tablet Take 1 tablet (10 mg total) by mouth 2 (two) times daily. 60 tablet 0   FLUoxetine (PROZAC) 20 MG tablet Take 1 tablet by mouth daily.     fluticasone (FLONASE) 50 MCG/ACT nasal spray Place into the nose.     folic acid (FOLVITE) 1 MG tablet Take 1 tablet (1 mg total) by mouth daily. 30 tablet 3   ibuprofen (ADVIL,MOTRIN) 200 MG tablet Take 200 mg by mouth every  6 (six) hours as needed.     No current facility-administered medications for this visit.     Allergies:   Latex   Social History:  The patient  reports that he has quit smoking. His smoking use included cigars. He has never used smokeless tobacco. He reports that he does not drink alcohol and does not use drugs.   Family History:   family history includes Heart attack in his father.    Review of Systems: Review of Systems  Constitutional:  Negative.   HENT: Negative.    Respiratory: Negative.    Cardiovascular: Negative.   Gastrointestinal: Negative.   Musculoskeletal: Negative.   Neurological: Negative.   Psychiatric/Behavioral: Negative.    All other systems reviewed and are negative.  PHYSICAL EXAM: VS:  There were no vitals taken for this visit. , BMI There is no height or weight on file to calculate BMI. GEN: Well nourished, well developed, in no acute distress HEENT: normal Neck: no JVD, carotid bruits, or masses Cardiac: RRR; no murmurs, rubs, or gallops,no edema  Respiratory:  clear to auscultation bilaterally, normal work of breathing GI: soft, nontender, nondistended, + BS MS: no deformity or atrophy Skin: warm and dry, no rash Neuro:  Strength and sensation are intact Psych: euthymic mood, full affect   Recent Labs: 04/30/2021: ALT 9; BUN 8; Hemoglobin 11.3; Platelets 188.0; Potassium 4.2; Sodium 140; TSH 2.75 06/05/2021: Creatinine, Ser 1.00    Lipid Panel Lab Results  Component Value Date   CHOL 165 04/30/2021   HDL 38.90 (L) 04/30/2021   LDLCALC 109 (H) 04/30/2021   TRIG 88.0 04/30/2021      Wt Readings from Last 3 Encounters:  05/01/21 128 lb 8 oz (58.3 kg)  04/30/21 127 lb 12.8 oz (58 kg)  07/24/20 128 lb (58.1 kg)      ASSESSMENT AND PLAN:  Problem List Items Addressed This Visit   None Chest pain/angina Coronary disease noted on CT scan chest Known peripheral arterial disease with stenting to legs bilaterally Reports progressive shortness of breath on exertion, Discussed potential options for ischemic work-up including cardiac CTA versus stress testing -Order placed for cardiac CTA to be done once he has signed up for his Medicare which he reports will be in place next week -Smoking cessation recommended  Smoker We have encouraged her to continue to work on weaning her cigarettes and smoking cessation. She will continue to work on this and does not want any assistance with  chantix.   Hyperlipidemia Continue statin, may need to add Zetia 10 mg daily achieve goal LDL less than 10  PAD Followed by vein and vascular in [place], Smoking cessation recommended, goal LDL less than 70 Denies claudication symptoms  Bradycardia Unrelated to above symptoms, relatively asymptomatic, blood pressure stable No indication for pacemaker Would allow liberal blood pressure    Total encounter time more than 45 minutes  Greater than 50% was spent in counseling and coordination of care with the patient    Signed, Esmond Plants, M.D., Ph.D. [place], [place]

## 2021-07-25 ENCOUNTER — Ambulatory Visit: Payer: Self-pay | Admitting: Cardiovascular Disease

## 2021-07-25 DIAGNOSIS — I2510 Atherosclerotic heart disease of native coronary artery without angina pectoris: Secondary | ICD-10-CM

## 2021-07-25 DIAGNOSIS — I739 Peripheral vascular disease, unspecified: Secondary | ICD-10-CM

## 2021-07-25 DIAGNOSIS — J439 Emphysema, unspecified: Secondary | ICD-10-CM

## 2021-07-25 DIAGNOSIS — R42 Dizziness and giddiness: Secondary | ICD-10-CM

## 2021-07-25 DIAGNOSIS — R0602 Shortness of breath: Secondary | ICD-10-CM

## 2021-07-25 DIAGNOSIS — I209 Angina pectoris, unspecified: Secondary | ICD-10-CM

## 2021-07-28 ENCOUNTER — Encounter: Payer: Self-pay | Admitting: Cardiovascular Disease

## 2021-08-05 ENCOUNTER — Telehealth: Payer: Self-pay | Admitting: Family

## 2021-08-05 NOTE — Telephone Encounter (Signed)
Call pt Does he still want [name] to order echocardiogram of heart?  This evaluates for underlying structural heart disease in the setting of his history of low heart rate.  If he would prefer, he may make a follow-up and we can discuss this at that time   FYI rasheedah

## 2021-08-06 ENCOUNTER — Ambulatory Visit (INDEPENDENT_AMBULATORY_CARE_PROVIDER_SITE_OTHER): Payer: Medicare Other

## 2021-08-06 ENCOUNTER — Other Ambulatory Visit: Payer: Self-pay

## 2021-08-06 DIAGNOSIS — E538 Deficiency of other specified B group vitamins: Secondary | ICD-10-CM

## 2021-08-06 MED ORDER — CYANOCOBALAMIN 1000 MCG/ML IJ SOLN
1000.0000 ug | Freq: Once | INTRAMUSCULAR | Status: AC
  Administered 2021-08-06: 1000 ug via INTRAMUSCULAR

## 2021-08-06 NOTE — Progress Notes (Signed)
Patient presented for B 12 injection to left deltoid, patient voiced no concerns nor showed any signs of distress during injection. 

## 2021-08-07 ENCOUNTER — Ambulatory Visit: Payer: Medicare Other | Admitting: Nurse Practitioner

## 2021-08-07 NOTE — Progress Notes (Deleted)
Cardiology Clinic Note   Patient Name: [name] Date of Encounter: 08/07/2021  Primary Care Provider:  [name], FNP Primary Cardiologist:  Ida Rogue, MD  Patient Profile    70 year old male with a history of CLL, hyperlipidemia, peripheral arterial disease, sinus bradycardia, remote tobacco abuse, anemia, and arthritis, presents for follow-up related to unstable angina and abnormal coronary CT angiography.  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 Tasia Catchings   Hyperlipidemia    Low back pain    Peripheral vascular disease [facility])    Past Surgical History:  Procedure Laterality Date   COLONOSCOPY     COLONOSCOPY WITH PROPOFOL N/A 07/23/2015   Procedure: COLONOSCOPY WITH PROPOFOL;  Surgeon: [name], MD;  Location[facility] ENDOSCOPY;  Service: Endoscopy;  Laterality: N/A;   ESOPHAGOGASTRODUODENOSCOPY (EGD) WITH PROPOFOL N/A 08/20/2015   Procedure: ESOPHAGOGASTRODUODENOSCOPY (EGD) WITH PROPOFOL;  Surgeon: [name], MD;  Location: [facility] ENDOSCOPY;  Service: Endoscopy;  Laterality: N/A;   LOWER EXTREMITY ANGIOGRAPHY Left 05/19/2018   Procedure: LOWER EXTREMITY ANGIOGRAPHY;  Surgeon: Algernon Huxley, MD;  Location: [place] CV LAB;  Service: Cardiovascular;  Laterality: Left;   LOWER EXTREMITY ANGIOGRAPHY Right 06/15/2018   Procedure: LOWER EXTREMITY ANGIOGRAPHY;  Surgeon: Algernon Huxley, MD;  Location: [place] CV LAB;  Service: Cardiovascular;  Laterality: Right;   lymp node removal Right 2011   arm   left arm 2015   LYMPH NODE BIOPSY  08/2014    Allergies  Allergies  Allergen Reactions   Latex Rash    History of Present Illness    70 year old male with the above past medical history including CLL, hyperlipidemia, sinus bradycardia, peripheral arterial disease, remote tobacco abuse, anemia, and arthritis.  In the  spring 2019, he underwent PTA and stenting of the bilateral SFA and popliteal arteries.  He is followed by vascular surgery.  He was referred to Dr. Rockey Situ in May secondary to exertional chest pain and dyspnea.  Prior chest CT showed mild to moderate coronary calcification.  He subsequently underwent coronary CT angiography in June of this year which showed significant first diagonal stenosis with an FFR CT of 0.73.  Recommendation was made for diagnostic catheterization.  Home Medications    Current Outpatient Medications  Medication Sig Dispense Refill   pravastatin (PRAVACHOL) 80 MG tablet Take 1 tablet (80 mg total) by mouth daily. 90 tablet 3   aspirin EC 81 MG tablet Take 1 tablet (81 mg total) by mouth daily. Swallow whole. 90 tablet 3   budesonide-formoterol (SYMBICORT) 80-4.5 MCG/ACT inhaler Inhale 2 puffs into the lungs 2 (two) times daily. 1 each 3   famotidine (PEPCID) 10 MG tablet Take 1 tablet (10 mg total) by mouth 2 (two) times daily. 60 tablet 0   FLUoxetine (PROZAC) 20 MG tablet Take 1 tablet by mouth daily.     fluticasone (FLONASE) 50 MCG/ACT nasal spray Place into the nose.     folic acid (FOLVITE) 1 MG tablet Take 1 tablet (1 mg total) by mouth daily. 30 tablet 3   ibuprofen (ADVIL,MOTRIN) 200 MG tablet Take 200 mg by mouth every 6 (six) hours as needed.     No current facility-administered medications for this visit.     Family History    Family History  Problem Relation Age of Onset   Heart  attack Father    Kidney cancer Neg Hx        lung cancer   Bladder Cancer Neg Hx    Prostate cancer Neg Hx    He indicated that his mother is deceased. He indicated that his father is deceased. He indicated that the status of his neg hx is unknown.  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    Let go from [facility] 11/2019 unexpectedly   Sons 2, daughters 4   10 grandchildren   Social Determinants of Radio broadcast assistant Strain: Not on file  Food Insecurity: Not on file  Transportation Needs: Not on file  Physical Activity: Not on file  Stress: Not on file  Social Connections: Not on file  Intimate Partner Violence: Not on file     Review of Systems    General:  No chills, fever, night sweats or weight changes.  Cardiovascular:  No chest pain, dyspnea on exertion, edema, orthopnea, palpitations, paroxysmal nocturnal dyspnea. Dermatological: No rash, lesions/masses Respiratory: No cough, dyspnea Urologic: No hematuria, dysuria Abdominal:   No nausea, vomiting, diarrhea, bright red blood per rectum, melena, or hematemesis Neurologic:  No visual changes, wkns, changes in mental status. All other systems reviewed and are otherwise negative except as noted above.  Physical Exam    VS:  There were no vitals taken for this visit. , BMI There is no height or weight on file to calculate BMI.     GEN: Well nourished, well developed, in no acute distress. HEENT: normal. Neck: Supple, no JVD, carotid bruits, or masses. Cardiac: RRR, no murmurs, rubs, or gallops. No clubbing, cyanosis, edema.  Radials/DP/PT 2+ and equal bilaterally.  Respiratory:  Respirations regular and unlabored, clear to auscultation bilaterally. GI: Soft, nontender, nondistended, BS + x 4. MS: no deformity or atrophy. Skin: warm and dry, no rash. Neuro:  Strength and sensation are intact. Psych: Normal  affect.  Accessory Clinical Findings    ECG personally reviewed by me today- *** - No acute changes  Lab Results  Component Value Date   WBC 27.2 Repeated and verified X2. (HH) 04/30/2021   HGB 11.3 (L) 04/30/2021   HCT 35.8 (L) 04/30/2021   MCV 78.1 04/30/2021   PLT 188.0 04/30/2021   Lab Results  Component Value Date   CREATININE 1.00 06/05/2021   BUN 8 04/30/2021   NA 140 04/30/2021   K 4.2 04/30/2021   CL 106 04/30/2021   CO2 30 04/30/2021   Lab Results  Component Value Date   ALT 9 04/30/2021   AST 15 04/30/2021   ALKPHOS 42 04/30/2021   BILITOT 0.7 04/30/2021   Lab Results  Component Value Date   CHOL 165 04/30/2021   HDL 38.90 (L) 04/30/2021  LDLCALC 109 (H) 04/30/2021   TRIG 88.0 04/30/2021   CHOLHDL 4 04/30/2021    Lab Results  Component Value Date   HGBA1C 5.8 04/21/2018    Assessment & Plan   1.  ***  Murray Hodgkins, NP 08/07/2021, 2:30 PM

## 2021-08-08 ENCOUNTER — Encounter: Payer: Self-pay | Admitting: Nurse Practitioner

## 2021-08-27 ENCOUNTER — Ambulatory Visit: Payer: Medicare Other | Admitting: Family

## 2021-09-09 ENCOUNTER — Ambulatory Visit: Payer: Medicare Other

## 2021-09-17 ENCOUNTER — Ambulatory Visit: Payer: Medicare Other | Admitting: Family

## 2021-11-18 ENCOUNTER — Other Ambulatory Visit: Payer: Self-pay

## 2021-11-18 ENCOUNTER — Encounter: Payer: Self-pay | Admitting: Family

## 2021-11-18 ENCOUNTER — Ambulatory Visit (INDEPENDENT_AMBULATORY_CARE_PROVIDER_SITE_OTHER): Payer: Medicare Other

## 2021-11-18 ENCOUNTER — Telehealth: Payer: Self-pay | Admitting: Family

## 2021-11-18 DIAGNOSIS — E538 Deficiency of other specified B group vitamins: Secondary | ICD-10-CM | POA: Diagnosis not present

## 2021-11-18 DIAGNOSIS — I2510 Atherosclerotic heart disease of native coronary artery without angina pectoris: Secondary | ICD-10-CM

## 2021-11-18 DIAGNOSIS — R5383 Other fatigue: Secondary | ICD-10-CM

## 2021-11-18 MED ORDER — CYANOCOBALAMIN 1000 MCG/ML IJ SOLN
1000.0000 ug | Freq: Once | INTRAMUSCULAR | Status: AC
  Administered 2021-11-18: 1000 ug via INTRAMUSCULAR

## 2021-11-18 NOTE — Telephone Encounter (Signed)
Call pt  Due for follow up I am managing his monthly b12 and he needs to be 1-2 per year  I have ordered FASTING labs to look at b12 as well. Please sch ahead of appt

## 2021-11-18 NOTE — Telephone Encounter (Signed)
LMTCB

## 2021-11-18 NOTE — Progress Notes (Signed)
Patient presented for B 12 injection to left deltoid, patient voiced no concerns nor showed any signs of distress during injection. 

## 2022-01-02 ENCOUNTER — Ambulatory Visit (INDEPENDENT_AMBULATORY_CARE_PROVIDER_SITE_OTHER): Payer: Medicare HMO

## 2022-01-02 ENCOUNTER — Other Ambulatory Visit: Payer: Self-pay

## 2022-01-02 DIAGNOSIS — E538 Deficiency of other specified B group vitamins: Secondary | ICD-10-CM

## 2022-01-02 MED ORDER — CYANOCOBALAMIN 1000 MCG/ML IJ SOLN
1000.0000 ug | Freq: Once | INTRAMUSCULAR | Status: AC
  Administered 2022-01-02: 1000 ug via INTRAMUSCULAR

## 2022-01-02 NOTE — Progress Notes (Signed)
Patient presented for B 12 injection to left deltoid, patient voiced no concerns nor showed any signs of distress during injection. 

## 2022-01-04 DIAGNOSIS — J0100 Acute maxillary sinusitis, unspecified: Secondary | ICD-10-CM | POA: Diagnosis not present

## 2022-01-16 ENCOUNTER — Telehealth: Payer: Self-pay | Admitting: Family

## 2022-01-16 ENCOUNTER — Other Ambulatory Visit: Payer: Self-pay

## 2022-01-16 ENCOUNTER — Ambulatory Visit (INDEPENDENT_AMBULATORY_CARE_PROVIDER_SITE_OTHER): Payer: Medicare HMO | Admitting: Family

## 2022-01-16 ENCOUNTER — Telehealth: Payer: Self-pay | Admitting: *Deleted

## 2022-01-16 ENCOUNTER — Encounter: Payer: Self-pay | Admitting: Family

## 2022-01-16 VITALS — BP 122/64 | HR 50 | Temp 98.1°F | Resp 14 | Ht 68.0 in | Wt 135.8 lb

## 2022-01-16 DIAGNOSIS — E538 Deficiency of other specified B group vitamins: Secondary | ICD-10-CM

## 2022-01-16 DIAGNOSIS — Z1211 Encounter for screening for malignant neoplasm of colon: Secondary | ICD-10-CM | POA: Diagnosis not present

## 2022-01-16 DIAGNOSIS — C9111 Chronic lymphocytic leukemia of B-cell type in remission: Secondary | ICD-10-CM | POA: Diagnosis not present

## 2022-01-16 DIAGNOSIS — F339 Major depressive disorder, recurrent, unspecified: Secondary | ICD-10-CM

## 2022-01-16 DIAGNOSIS — R001 Bradycardia, unspecified: Secondary | ICD-10-CM

## 2022-01-16 DIAGNOSIS — I2510 Atherosclerotic heart disease of native coronary artery without angina pectoris: Secondary | ICD-10-CM | POA: Diagnosis not present

## 2022-01-16 LAB — CBC WITH DIFFERENTIAL/PLATELET
Basophils Absolute: 0.3 10*3/uL — ABNORMAL HIGH (ref 0.0–0.1)
Basophils Relative: 0.7 % (ref 0.0–3.0)
Eosinophils Absolute: 0.1 10*3/uL (ref 0.0–0.7)
Eosinophils Relative: 0.3 % (ref 0.0–5.0)
HCT: 32.9 % — ABNORMAL LOW (ref 39.0–52.0)
Hemoglobin: 10.2 g/dL — ABNORMAL LOW (ref 13.0–17.0)
Lymphocytes Relative: 90 % — ABNORMAL HIGH (ref 12.0–46.0)
Lymphs Abs: 35.9 10*3/uL — ABNORMAL HIGH (ref 0.7–4.0)
MCHC: 31.1 g/dL (ref 30.0–36.0)
MCV: 82.6 fl (ref 78.0–100.0)
Monocytes Absolute: 0.7 10*3/uL (ref 0.1–1.0)
Monocytes Relative: 1.8 % — ABNORMAL LOW (ref 3.0–12.0)
Neutro Abs: 2.9 10*3/uL (ref 1.4–7.7)
Neutrophils Relative %: 7.2 % — ABNORMAL LOW (ref 43.0–77.0)
Platelets: 152 10*3/uL (ref 150.0–400.0)
RBC: 3.98 Mil/uL — ABNORMAL LOW (ref 4.22–5.81)
RDW: 16.9 % — ABNORMAL HIGH (ref 11.5–15.5)
WBC: 39.9 10*3/uL (ref 4.0–10.5)

## 2022-01-16 LAB — COMPREHENSIVE METABOLIC PANEL
ALT: 10 U/L (ref 0–53)
AST: 18 U/L (ref 0–37)
Albumin: 4.2 g/dL (ref 3.5–5.2)
Alkaline Phosphatase: 58 U/L (ref 39–117)
BUN: 13 mg/dL (ref 6–23)
CO2: 29 mEq/L (ref 19–32)
Calcium: 9.3 mg/dL (ref 8.4–10.5)
Chloride: 108 mEq/L (ref 96–112)
Creatinine, Ser: 1.4 mg/dL (ref 0.40–1.50)
GFR: 51.64 mL/min — ABNORMAL LOW (ref >60.00)
Glucose, Bld: 71 mg/dL (ref 70–99)
Potassium: 4.2 mEq/L (ref 3.5–5.1)
Sodium: 142 mEq/L (ref 135–145)
Total Bilirubin: 0.7 mg/dL (ref 0.2–1.2)
Total Protein: 6.3 g/dL (ref 6.0–8.3)

## 2022-01-16 LAB — IBC + FERRITIN
Ferritin: 214.1 ng/mL (ref 22.0–322.0)
Iron: 105 ug/dL (ref 42–165)
Saturation Ratios: 39.1 % (ref 20.0–50.0)
TIBC: 268.8 ug/dL (ref 250.0–450.0)
Transferrin: 192 mg/dL — ABNORMAL LOW (ref 212.0–360.0)

## 2022-01-16 LAB — B12 AND FOLATE PANEL
Folate: 8 ng/mL (ref >5.9)
Vitamin B-12: 564 pg/mL (ref 211–911)

## 2022-01-16 MED ORDER — PRAVASTATIN SODIUM 80 MG PO TABS: 80.0000 mg | ORAL_TABLET | Freq: Every day | ORAL | 3 refills | Status: DC

## 2022-01-16 NOTE — Telephone Encounter (Signed)
CRITICAL VALUE STICKER  CRITICAL VALUE: WBC-39.9  RECEIVER (on-site recipient of call):  Jari Favre, CMA/XT  DATE & TIME NOTIFIED: 01/16/22 @ 2:49pm  MESSENGER (representative from lab): Santiago Glad  MD NOTIFIED:  Dr Derrel Nip in absence of PCP  TIME OF NOTIFICATION: 2:52pm  RESPONSE:  see note

## 2022-01-16 NOTE — Assessment & Plan Note (Signed)
No recent follow-up with oncology.  I have emphasized the importance of this and asked CMA to arrange follow up.

## 2022-01-16 NOTE — Assessment & Plan Note (Signed)
Appears quiescent at this time. will continue to monitor

## 2022-01-16 NOTE — Telephone Encounter (Signed)
Pt overdue for follow up with Dr Tasia Catchings hematology, Dr Rockey Situ cardiology Please sch both for pt and let him know

## 2022-01-16 NOTE — Assessment & Plan Note (Signed)
Symptomatically stable.  Currently patient is not taking pravastatin.  I strongly encouraged and emphasized the importance of him remaining on statin therapy due to his elevated ASCVD risk.  Advised to resume 81 mg ASA.  patient verbalized understanding.  Resume pravastatin with follow-up with me in 6 weeks where I can obtain CMP at visit

## 2022-01-16 NOTE — Assessment & Plan Note (Signed)
Chronic, stable.  Pending B12, pernicious anemia screening.  Continue monthly B12 injections as  have been helpful for fatigue

## 2022-01-16 NOTE — Progress Notes (Signed)
Subjective:    Patient ID: [name], male    DOB: 24-Oct-1951, 71 y.o.   MRN: [id number]  CC: [name] is a 71 y.o. male who presents today for follow up.   HPI: He is feeling well today. No new complaints.   Recent sinus infection and currently on doxycycline with improvement of congestion. No fever.   His appetite has improved. He is pleased with his weight gain. He denies depression and anxiety. No si/hi.   No cp, sob  B12 deficiency- he finds helpful for energy. He would to continue monthly injections.   He remains active, staying physically active doing yard work.  Last seen 05/01/2021 [name] for CP, CAD, bradycardia. He didn't have CT calcium score, cardiac stress test. He is not compliant with asa 81mg , statin.   H/o PAD  Smoker  Overdue for follow up with [name] for CLL. 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 Tasia Henson   Hyperlipidemia    Low back pain    Peripheral vascular disease [facility])    Past Surgical History:  Procedure Laterality Date   COLONOSCOPY     COLONOSCOPY WITH PROPOFOL N/A 07/23/2015   Procedure: COLONOSCOPY WITH PROPOFOL;  Surgeon: Joseph Bellow, MD;  Location: [facility] ENDOSCOPY;  Service: Endoscopy;  Laterality: N/A;   ESOPHAGOGASTRODUODENOSCOPY (EGD) WITH PROPOFOL N/A 08/20/2015   Procedure: ESOPHAGOGASTRODUODENOSCOPY (EGD) WITH PROPOFOL;  Surgeon: Joseph Bellow, MD;  Location: [facility] ENDOSCOPY;  Service: Endoscopy;  Laterality: N/A;   LOWER EXTREMITY ANGIOGRAPHY Left 05/19/2018   Procedure: LOWER EXTREMITY ANGIOGRAPHY;  Surgeon: Joseph Huxley, MD;  Location: [place] CV LAB;  Service: Cardiovascular;  Laterality: Left;   LOWER EXTREMITY ANGIOGRAPHY Right 06/15/2018   Procedure: LOWER EXTREMITY ANGIOGRAPHY;  Surgeon: Joseph Huxley, MD;  Location: [place] CV LAB;  Service: Cardiovascular;   Laterality: Right;   lymp node removal Right 2011   arm   left arm 2015   LYMPH NODE BIOPSY  08/2014   Family History  Problem Relation Age of Onset   Heart attack Father    Kidney cancer Neg Hx        lung cancer   Bladder Cancer Neg Hx    Prostate cancer Neg Hx     Allergies: Latex Current Outpatient Medications on File Prior to Visit  Medication Sig Dispense Refill   aspirin EC 81 MG tablet Take 81 mg by mouth daily. Swallow whole. 90 tablet 3   No current facility-administered medications on file prior to visit.    Social History   Tobacco Use   Smoking status: Former    Types: Cigars   Smokeless tobacco: Never   Tobacco comments:    .5 ppd last year, total of 48.5 pack year  Substance Use Topics   Alcohol use: No   Drug use: No    Review of Systems  Constitutional:  Negative for chills and fever.  HENT:  Negative for congestion, ear pain, rhinorrhea, sinus pressure and sore throat.   Respiratory:  Negative for cough, shortness of breath and wheezing.   Cardiovascular:  Negative for chest pain and palpitations.  Gastrointestinal:  Negative for diarrhea, nausea and vomiting.  Genitourinary:  Negative for dysuria.  Musculoskeletal:  Negative for myalgias.  Skin:  Negative for rash.  Neurological:  Negative for headaches.  Hematological:  Negative for adenopathy.     Objective:    BP 122/64 (BP Location: Left Arm, Patient Position: Sitting, Cuff Size: Small)    Pulse (!) 50    Temp 98.1 F (36.7 C) (Oral)    Resp 14    Ht 5\' 8"  (1.727 m)    Wt 135 lb 12.8 oz (61.6 kg)    SpO2 99%    BMI 20.65 kg/m  BP Readings from Last 3 Encounters:  01/16/22 122/64  06/05/21 110/68  05/01/21 120/60   Wt Readings from Last 3 Encounters:  01/16/22 135 lb 12.8 oz (61.6 kg)  05/01/21 128 lb 8 oz (58.3 kg)  04/30/21 127 lb 12.8 oz (58 kg)    Physical Exam Vitals reviewed.  Constitutional:      Appearance: He is well-developed.  Cardiovascular:     Rate and Rhythm:  Regular rhythm.     Heart sounds: Normal heart sounds.  Pulmonary:     Effort: Pulmonary effort is normal. No respiratory distress.     Breath sounds: Normal breath sounds. No wheezing, rhonchi or rales.  Skin:    General: Skin is warm and dry.  Neurological:     Mental Status: He is alert.  Psychiatric:        Speech: Speech normal.        Behavior: Behavior normal.       Assessment & Plan:   Problem List Items Addressed This Visit       Cardiovascular and Mediastinum   Coronary atherosclerosis    Symptomatically stable.  Currently patient is not taking pravastatin.  I strongly encouraged and emphasized the importance of him remaining on statin therapy due to his elevated ASCVD risk.  Advised to resume 81 mg ASA.  patient verbalized understanding.  Resume pravastatin with follow-up with me in 6 weeks where I can obtain CMP at visit      Relevant Medications   pravastatin (PRAVACHOL) 80 MG tablet     Other   B12 deficiency - Primary    Chronic, stable.  Pending B12, pernicious anemia screening.  Continue monthly B12 injections as  have been helpful for fatigue      Relevant Orders   B12 and Folate Panel   Celiac Disease Ab Screen w/Rfx   Anti-parietal antibody   Homocysteine   IBC + Ferritin   CBC with Differential/Platelet   Comprehensive metabolic panel   Hemoglobin A1c   Bradycardia    Asymptomatic at this time.  Long discussion with patient regarding the importance of having both  CT calcium score, stress test.  Explained the importance of this to him and we are calling cardiology, Joseph. Rockey Henson to arrange this follow-up to these test may be scheduled.  He verbalized understanding      Chronic lymphocytic leukemia (CLL), B-cell ([place])    No recent follow-up with oncology.  I have emphasized the importance of this and asked CMA to arrange follow up.       Depression, recurrent ([place])    Appears quiescent at this time. will continue to monitor      Other Visit  Diagnoses     Screen for colon cancer       Relevant Orders   Ambulatory referral to Gastroenterology        I have discontinued [name]'s ibuprofen, FLUoxetine, fluticasone, famotidine, budesonide-formoterol, and folic acid. I am also having him maintain his aspirin EC and pravastatin.   Meds ordered this encounter  Medications   pravastatin (PRAVACHOL)  80 MG tablet    Sig: Take 1 tablet (80 mg total) by mouth daily.    Dispense:  90 tablet    Refill:  3    Order Specific Question:   Supervising Provider    Answer:   Crecencio Mc [[id number]]    Return precautions given.   Risks, benefits, and alternatives of the medications and treatment plan prescribed today were discussed, and patient expressed understanding.   Education regarding symptom management and diagnosis given to patient on AVS.  Continue to follow with [name], FNP for routine health maintenance.   Earney Hamburg and I agreed with plan.   [name], FNP

## 2022-01-16 NOTE — Patient Instructions (Signed)
Referral for colonoscopy Let us know if you dont hear back within a week in regards to an appointment being scheduled.    We are working on appointments with cardiology and also hematology/oncology. We will call you with these.

## 2022-01-16 NOTE — Telephone Encounter (Signed)
noted 

## 2022-01-16 NOTE — Assessment & Plan Note (Signed)
Asymptomatic at this time.  Long discussion with patient regarding the importance of having both  CT calcium score, stress test.  Explained the importance of this to him and we are calling cardiology, Dr. Rockey Situ to arrange this follow-up to these test may be scheduled.  He verbalized understanding

## 2022-01-16 NOTE — Telephone Encounter (Signed)
FYI Tullo  Thanks Toya for notifying Tullo regarding WBC of 39  Patient is overdue for follow up with Dr Tasia Catchings for chronic lymphocytic leukemia.    Dr Tasia Catchings,  Can we get patient scheduled with you ? I had had asked CMA to call to get him scheduled. Can you ask your nurse to reach out to him ?   WBC increased from 27 last May to 39. He was not acutely symptomatic today aside from fatigue which has improved on b12 injections.

## 2022-01-19 LAB — CELIAC DISEASE AB SCREEN W/RFX
Antigliadin Abs, IgA: 3 units (ref 0–19)
IgA/Immunoglobulin A, Serum: 110 mg/dL (ref 61–437)
Transglutaminase IgA: <2 U/mL (ref 0–3)

## 2022-01-19 LAB — ANTI-PARIETAL ANTIBODY: PARIETAL CELL AB SCREEN: NEGATIVE

## 2022-01-19 LAB — HEMOGLOBIN A1C: Hgb A1c MFr Bld: 5.9 % (ref 4.6–6.5)

## 2022-01-19 LAB — HOMOCYSTEINE: Homocysteine: 12.5 umol/L — ABNORMAL HIGH (ref <11.4)

## 2022-01-20 ENCOUNTER — Telehealth: Payer: Self-pay | Admitting: Oncology

## 2022-01-20 ENCOUNTER — Telehealth: Payer: Self-pay

## 2022-01-20 NOTE — Telephone Encounter (Signed)
LMTCB for labs. 

## 2022-01-20 NOTE — Telephone Encounter (Signed)
Please schedule patient to see NP only and notify pt of appt.

## 2022-01-20 NOTE — Telephone Encounter (Signed)
Attempted to contact pt to schedule a follow up with NP, per Dr. Tasia Catchings request. Instructed to call back to set up appt.

## 2022-01-20 NOTE — Telephone Encounter (Signed)
LMTCB

## 2022-01-21 ENCOUNTER — Telehealth: Payer: Self-pay

## 2022-01-21 NOTE — Telephone Encounter (Signed)
-----   Message from Earlie Server, MD sent at 01/20/2022  9:02 PM EST ----- Please schedule appt with NP

## 2022-01-21 NOTE — Progress Notes (Signed)
Phone note created 

## 2022-01-21 NOTE — Telephone Encounter (Signed)
Aby, Please schedule patient with NP next available. Please inform patient.Thanks.

## 2022-01-26 NOTE — Telephone Encounter (Signed)
Oncology has reached out and left him a message 01/20/22

## 2022-02-03 ENCOUNTER — Ambulatory Visit: Payer: Self-pay

## 2022-02-19 ENCOUNTER — Other Ambulatory Visit: Payer: Self-pay

## 2022-02-19 ENCOUNTER — Encounter: Payer: Self-pay | Admitting: Oncology

## 2022-02-19 ENCOUNTER — Inpatient Hospital Stay: Payer: Medicare HMO | Attending: Oncology | Admitting: Oncology

## 2022-02-19 ENCOUNTER — Inpatient Hospital Stay: Payer: Medicare HMO

## 2022-02-19 VITALS — BP 126/73 | HR 50 | Temp 97.2°F | Wt 136.4 lb

## 2022-02-19 DIAGNOSIS — C9110 Chronic lymphocytic leukemia of B-cell type not having achieved remission: Secondary | ICD-10-CM | POA: Insufficient documentation

## 2022-02-19 DIAGNOSIS — Z7189 Other specified counseling: Secondary | ICD-10-CM | POA: Diagnosis not present

## 2022-02-19 DIAGNOSIS — R5383 Other fatigue: Secondary | ICD-10-CM | POA: Diagnosis not present

## 2022-02-19 DIAGNOSIS — E538 Deficiency of other specified B group vitamins: Secondary | ICD-10-CM

## 2022-02-19 DIAGNOSIS — Z87891 Personal history of nicotine dependence: Secondary | ICD-10-CM | POA: Insufficient documentation

## 2022-02-19 DIAGNOSIS — D563 Thalassemia minor: Secondary | ICD-10-CM

## 2022-02-19 DIAGNOSIS — D649 Anemia, unspecified: Secondary | ICD-10-CM

## 2022-02-19 LAB — HEPATITIS PANEL, ACUTE
HCV Ab: NONREACTIVE
Hep A IgM: NONREACTIVE
Hep B C IgM: NONREACTIVE
Hepatitis B Surface Ag: NONREACTIVE

## 2022-02-19 LAB — COMPREHENSIVE METABOLIC PANEL
ALT: 12 U/L (ref 0–44)
AST: 22 U/L (ref 15–41)
Albumin: 4.1 g/dL (ref 3.5–5.0)
Alkaline Phosphatase: 55 U/L (ref 38–126)
Anion gap: 6 (ref 5–15)
BUN: 13 mg/dL (ref 8–23)
CO2: 25 mmol/L (ref 22–32)
Calcium: 8.9 mg/dL (ref 8.9–10.3)
Chloride: 107 mmol/L (ref 98–111)
Creatinine, Ser: 1.38 mg/dL — ABNORMAL HIGH (ref 0.61–1.24)
GFR, Estimated: 56 mL/min — ABNORMAL LOW (ref >60)
Glucose, Bld: 94 mg/dL (ref 70–99)
Potassium: 4 mmol/L (ref 3.5–5.1)
Sodium: 138 mmol/L (ref 135–145)
Total Bilirubin: 0.4 mg/dL (ref 0.3–1.2)
Total Protein: 6.3 g/dL — ABNORMAL LOW (ref 6.5–8.1)

## 2022-02-19 LAB — CBC WITH DIFFERENTIAL/PLATELET
Abs Immature Granulocytes: 0.1 10*3/uL — ABNORMAL HIGH (ref 0.00–0.07)
Basophils Absolute: 0 10*3/uL (ref 0.0–0.1)
Basophils Relative: 0 %
Eosinophils Absolute: 0.2 10*3/uL (ref 0.0–0.5)
Eosinophils Relative: 0 %
HCT: 30.1 % — ABNORMAL LOW (ref 39.0–52.0)
Hemoglobin: 9.2 g/dL — ABNORMAL LOW (ref 13.0–17.0)
Immature Granulocytes: 0 %
Lymphocytes Relative: 74 %
Lymphs Abs: 31.9 10*3/uL — ABNORMAL HIGH (ref 0.7–4.0)
MCH: 25.7 pg — ABNORMAL LOW (ref 26.0–34.0)
MCHC: 30.6 g/dL (ref 30.0–36.0)
MCV: 84.1 fL (ref 80.0–100.0)
Monocytes Absolute: 8.4 10*3/uL — ABNORMAL HIGH (ref 0.1–1.0)
Monocytes Relative: 20 %
Neutro Abs: 2.5 10*3/uL (ref 1.7–7.7)
Neutrophils Relative %: 6 %
Platelets: 148 10*3/uL — ABNORMAL LOW (ref 150–400)
RBC: 3.58 MIL/uL — ABNORMAL LOW (ref 4.22–5.81)
RDW: 17.1 % — ABNORMAL HIGH (ref 11.5–15.5)
Smear Review: NORMAL
WBC Morphology: ABNORMAL
WBC: 43.1 10*3/uL — ABNORMAL HIGH (ref 4.0–10.5)
nRBC: 0 % (ref 0.0–0.2)

## 2022-02-19 LAB — RETIC PANEL
Immature Retic Fract: 30 % — ABNORMAL HIGH (ref 2.3–15.9)
RBC.: 3.53 MIL/uL — ABNORMAL LOW (ref 4.22–5.81)
Retic Count, Absolute: 82.2 10*3/uL (ref 19.0–186.0)
Retic Ct Pct: 2.3 % (ref 0.4–3.1)
Reticulocyte Hemoglobin: 26.8 pg — ABNORMAL LOW (ref >27.9)

## 2022-02-19 LAB — IRON AND TIBC
Iron: 104 ug/dL (ref 45–182)
Saturation Ratios: 40 % — ABNORMAL HIGH (ref 17.9–39.5)
TIBC: 260 ug/dL (ref 250–450)
UIBC: 156 ug/dL

## 2022-02-19 LAB — FERRITIN: Ferritin: 160 ng/mL (ref 24–336)

## 2022-02-19 LAB — LACTATE DEHYDROGENASE: LDH: 150 U/L (ref 98–192)

## 2022-02-19 NOTE — Progress Notes (Signed)
[facility] day:  02/19/22    Chief Complaint: [name] is a 71 y.o. 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  HPI:  The patient was last seen in the medical onocology clinic on 12/25/2016. Patient reports feeling profound fatigue, lack of appetite, weight loss. He weight 128 pounds today, comparing to 137 pounds when he was here in January 2018.  He met with [name], registered dietitian on 01/04/2017. Patient used to follow up with Dr.[name].   Treatment history: He originally presented with hemolytic anemia in 01/2014.  He was treated with prednisone. With taper of prednisone, his hemolysis returned.I think it's only positives are Per Dr.[name]'s note, patient received 4 weekly cycles of Rituxan (10/02/2014- 10/23/2014).   07/23/2017 Decision was made to start Ibrutinib as patient is very symptomatic (fatigue and weight loss, lack of appetite). Patient did not show up at the chemo education class. He was never started on Ibrutinib and his constitutional symptoms and is symptoms improved.  We agreed on not to proceed with treatments as it was not clear whether his symptoms are secondary to CLL or not. Patient was recommended for watchful waiting. 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   After visit on 06/03/2018, he lost follow-up  seek second opinion at [facility] and was evaluated by heme-onc Dr.Ellis.  Patient was recommended for watchful waiting.  Patient was seen by [facility] heme-onc in October 2020   Patient reestablish care on 05/07/2020 and lost follow-up again     INTERVAL HISTORY 71 y.o. male with history of CLL presents for follow-up.  Patient was last seen on 05/07/2020.  Patient is not compliant with follow-up. Today he reports feeling well.  He has gained some weight.  Appetite is fair. Denies any unintentional weight loss, fever, night sweats.  he  denies any pain.  Chronic fatigue is at the same level.  Past Medical History:  Diagnosis Date   Anemia 2008   Arthritis    Cervicalgia    Chronic lymphocytic leukemia (CLL), B-cell ([place]) 06/15/2015   Dr. Tasia Catchings   Hyperlipidemia    Low back pain    Peripheral vascular disease [facility])     Past Surgical History:  Procedure Laterality Date   COLONOSCOPY     COLONOSCOPY WITH PROPOFOL N/A 07/23/2015   Procedure: COLONOSCOPY WITH PROPOFOL;  Surgeon: [name], MD;  Location: [facility] ENDOSCOPY;  Service: Endoscopy;  Laterality: N/A;   ESOPHAGOGASTRODUODENOSCOPY (EGD) WITH PROPOFOL N/A 08/20/2015   Procedure: ESOPHAGOGASTRODUODENOSCOPY (EGD) WITH PROPOFOL;  Surgeon: [name], MD;  Location: [facility] ENDOSCOPY;  Service: Endoscopy;  Laterality: N/A;   LOWER EXTREMITY ANGIOGRAPHY Left 05/19/2018   Procedure: LOWER EXTREMITY ANGIOGRAPHY;  Surgeon: Algernon Huxley, MD;  Location: [place] CV LAB;  Service: Cardiovascular;  Laterality: Left;   LOWER EXTREMITY ANGIOGRAPHY Right 06/15/2018   Procedure: LOWER EXTREMITY ANGIOGRAPHY;  Surgeon: Algernon Huxley, MD;  Location: [place] CV LAB;  Service: Cardiovascular;  Laterality: Right;   lymp node removal Right 2011   arm   left arm 2015   LYMPH NODE BIOPSY  08/2014    Family History  Problem Relation Age of Onset   Heart attack Father    Kidney cancer Neg Hx        lung cancer   Bladder Cancer Neg Hx    Prostate cancer Neg Hx     Social History:  reports that  he has quit smoking. His smoking use included cigars. He has never used smokeless tobacco. He reports that he does not drink alcohol and does not use drugs.  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  Allergies:  Allergies  Allergen Reactions   Latex Rash   Review of Systems:  Review of Systems  Constitutional:  Negative for chills, fever, malaise/fatigue and weight loss.  HENT:  Negative for ear  discharge, ear pain, nosebleeds and sore throat.   Eyes:  Negative for photophobia, pain and redness.  Respiratory:  Negative for cough, hemoptysis, sputum production, shortness of breath and wheezing.   Cardiovascular:  Negative for chest pain, palpitations and leg swelling.  Gastrointestinal:  Negative for abdominal pain, blood in stool, constipation, diarrhea, heartburn, nausea and vomiting.  Genitourinary:  Negative for dysuria and frequency.  Musculoskeletal:  Negative for myalgias and neck pain.  Skin:  Negative for rash.  Neurological:  Negative for dizziness, tingling, tremors, weakness and headaches.  Endo/Heme/Allergies:  Does not bruise/bleed easily.  Psychiatric/Behavioral:  Negative for depression, hallucinations and suicidal ideas.     Current Outpatient Medications on File Prior to Visit  Medication Sig Dispense Refill   aspirin EC 81 MG tablet Take 81 mg by mouth daily. Swallow whole. 90 tablet 3   pravastatin (PRAVACHOL) 80 MG tablet Take 1 tablet (80 mg total) by mouth daily. 90 tablet 3   No current facility-administered medications on file prior to visit.    Physical Exam: Blood pressure 126/73, pulse (!) 50, temperature (!) 97.2 F (36.2 C), temperature source Tympanic, weight 136 lb 6.4 oz (61.9 kg). Physical Exam Constitutional:      General: He is not in acute distress.    Appearance: He is not diaphoretic.  HENT:     Head: Normocephalic and atraumatic.     Nose: Nose normal.     Mouth/Throat:     Pharynx: No oropharyngeal exudate.  Eyes:     General: No scleral icterus.    Pupils: Pupils are equal, round, and reactive to light.  Cardiovascular:     Rate and Rhythm: Normal rate and regular rhythm.     Heart sounds: No murmur heard. Pulmonary:     Effort: Pulmonary effort is normal. No respiratory distress.     Breath sounds: No rales.  Chest:     Chest wall: No tenderness.  Abdominal:     General: There is no distension.     Palpations: Abdomen is  soft.     Tenderness: There is no abdominal tenderness.  Musculoskeletal:        General: Normal range of motion.     Cervical back: Normal range of motion and neck supple.  Skin:    General: Skin is warm and dry.     Findings: No erythema.  Neurological:     Mental Status: He is alert and oriented to person, place, and time.     Cranial Nerves: No cranial nerve deficit.     Motor: No abnormal muscle tone.     Coordination: Coordination normal.  Psychiatric:        Mood and Affect: Affect normal.   LABORATORY RESULTS. CBC    Component Value Date/Time   WBC 43.1 (H) 02/19/2022 1007   RBC 3.53 (L) 02/19/2022 1007   RBC 3.58 (L) 02/19/2022 1007   HGB 9.2 (L) 02/19/2022 1007   HGB 13.0 03/13/2015 0852  HCT 30.1 (L) 02/19/2022 1007   HCT 40.9 03/13/2015 0852   PLT 148 (L) 02/19/2022 1007   PLT 217 03/13/2015 0852   MCV 84.1 02/19/2022 1007   MCV 78 (L) 03/13/2015 0852   MCH 25.7 (L) 02/19/2022 1007   MCHC 30.6 02/19/2022 1007   RDW 17.1 (H) 02/19/2022 1007   RDW 16.0 (H) 03/13/2015 0852   LYMPHSABS 31.9 (H) 02/19/2022 1007   LYMPHSABS 3.9 (H) 03/13/2015 0852   MONOABS 8.4 (H) 02/19/2022 1007   MONOABS 0.4 03/13/2015 0852   EOSABS 0.2 02/19/2022 1007   EOSABS 0.4 03/13/2015 0852   BASOSABS 0.0 02/19/2022 1007   BASOSABS 0.1 03/13/2015 0852   CMP     Component Value Date/Time   NA 138 02/19/2022 1007   NA 138 10/16/2019 0000   NA 136 03/13/2015 0852   K 4.0 02/19/2022 1007   K 3.9 03/13/2015 0852   CL 107 02/19/2022 1007   CL 104 03/13/2015 0852   CO2 25 02/19/2022 1007   CO2 26 03/13/2015 0852   GLUCOSE 94 02/19/2022 1007   GLUCOSE 96 03/13/2015 0852   BUN 13 02/19/2022 1007   BUN 14 10/16/2019 0000   BUN 11 03/13/2015 0852   CREATININE 1.38 (H) 02/19/2022 1007   CREATININE 1.17 02/03/2019 1612   CALCIUM 8.9 02/19/2022 1007   CALCIUM 9.3 03/13/2015 0852   PROT 6.3 (L) 02/19/2022 1007   PROT 8.1 03/13/2015 0852   ALBUMIN 4.1 02/19/2022 1007   ALBUMIN 4.5  03/13/2015 0852   AST 22 02/19/2022 1007   AST 26 03/13/2015 0852   ALT 12 02/19/2022 1007   ALT 12 (L) 03/13/2015 0852   ALKPHOS 55 02/19/2022 1007   ALKPHOS 57 03/13/2015 0852   BILITOT 0.4 02/19/2022 1007   BILITOT 1.2 03/13/2015 0852   GFRNONAA 56 (L) 02/19/2022 1007   GFRNONAA >60 03/13/2015 0852   GFRAA >60 04/25/2020 1123   GFRAA >60 03/13/2015 0852   PATHOLOGY:  11/02/2016 Peripheral blood FISH studies revealed 20% of nuclei positive for loss of 1 ATM signal, 63% of nuclei positive for trisomy 12, and 32% of nuclei positive for loss of 1 TP53 signal. Results for CCND1/IGH and 13q were normal.  09/13/2014 Left axillary node biopsy on  confirmed B-cell small lymphocytic lymphoma (B-CLL/SLL)   IMAGE STUDIES  02/09/2014 Chest and abdomen CT scan  revealed bulky bilateral axillary adenopathy and bulky intraperitoneal and retroperitoneal adenopathy. 07/11/2014 PET scan  revealed mildly hypermetabolic lymph nodes in the chest, abdomen, and pelvis and mildly hypermetabolic spleen.  06/24/2016 PET scan  revealed bilateral moderate hypermetabolic axillary lymphadenopathy increased in size (1.6 cm compared to 1.3 cm) and metabolism (SUV 3.9 compared to 3.6).  There were bilateral moderate hypermetabolic external iliac lymphadenopathy stable to decreased in metabolism.  There was mesenteric and retroperitoneal lymphadenopathy stable in size and non hypermetabolic.  There were no new sites of hypermetabolic lymphoma.  There were subcentimeter pulmonary nodules stable and below PET resolution. 07/21/2017 CT chest abdomen pelvis w contrast:  Mild interval increase in bulky mesenteric lymphadenopathy since prior exam. Stable lymphadenopathy in bilateral axillary regions, abdominal retroperitoneum, and bilateral external iliac chains.  PREVIOUS ENDOSCOPY He has slowly progressive microcytic anemia.  Colonoscopy on 07/23/2015 revealed diverticulosis in the sigmoid colon.  EGD on 08/20/2015 was  normal. Guaiac cards x 3 were negative in 06/2015.  His diet fluctuates with his appetite.  He denies any melana or hematochezia.   Assessment:  [name] is a 71 y.o. male with  B-cell small lymphocytic lymphoma (B-cell CLL/SLL) initially presented with hemolytic anemia in 01/2014, s/p treatment with steroid and Rituximab, presents for follow-up.  1. CLL (chronic lymphocytic leukemia) (HCC)   2. Beta thalassemia minor   3. Normocytic anemia   4. Goals of care, counseling/discussion   5. Personal history of tobacco use, presenting hazards to health     Cancer Staging  Chronic lymphocytic leukemia (CLL), B-cell ([place]) Staging form: Chronic Lymphocytic Leukemia / Small Lymphocytic Lymphoma, AJCC 8th Edition - Clinical stage from 07/23/2017: Modified Rai Stage III (Modified Rai risk: High, Lymphocytosis: Present, Adenopathy: Present, Organomegaly: Absent, Anemia: Present, Thrombocytopenia: Absent) - Signed by Earlie Server, MD on 02/19/2022   #CLL, patient was previously treated with rituximab.  There was plan in the past to start him on ibrutinib, and patient did not start treatment due to noncompliance. Recommend to check CBC, CMP, LDH, hepatitis panel.  Check I GVH hyper mutation level and CLL FISH panel. We will check hepatitis panel. He does not have significant constitutional symptoms. Leukocytosis, progressively increasing.   #Worsening anemia, hemoglobin continues to trend down.  Less than his baseline. Patient has a history of beta thalassemia minor with chronic microcytic anemia.  Iron panel showed ferritin of 160, saturation 40, not consistent with iron deficiency.  This could be secondary to CLL progression. Patient may need to be started on treatment in the near future.  Close monitor.  #Tobacco abuse, smoke cessation discussed with patient.  06/06/2021 lung cancer screening.  Follow up  Orders Placed This Encounter  Procedures   Comprehensive metabolic panel    Standing Status:    Future    Number of Occurrences:   1    Standing Expiration Date:   02/20/2023   CBC with Differential/Platelet    Standing Status:   Future    Number of Occurrences:   1    Standing Expiration Date:   02/20/2023   Hepatitis panel, acute    Standing Status:   Future    Number of Occurrences:   1    Standing Expiration Date:   02/20/2023   Lactate dehydrogenase    Standing Status:   Future    Number of Occurrences:   1    Standing Expiration Date:   02/20/2023   Ferritin    Standing Status:   Future    Number of Occurrences:   1    Standing Expiration Date:   08/22/2022   Iron and TIBC    Standing Status:   Future    Number of Occurrences:   1    Standing Expiration Date:   02/20/2023   Retic Panel    Standing Status:   Future    Number of Occurrences:   1    Standing Expiration Date:   02/20/2023   Miscellaneous [facility] test (send-out)    Standing Status:   Future    Number of Occurrences:   1    Standing Expiration Date:   02/20/2023    Order Specific Question:   Test name / description:    Answer:   IGVH somatic hypermutation [facility] [id number]   FISH HES [id number] REA    Standing Status:   Future    Number of Occurrences:   1    Standing Expiration Date:   02/20/2023   We spent sufficient time to discuss many aspect of care, questions were answered to patient's satisfaction.  Earlie Server, MD, PhD Hematology Oncology 02/19/2022

## 2022-02-19 NOTE — Progress Notes (Signed)
Patient here for follow up. Patient states he is fatigue.  ?

## 2022-02-23 ENCOUNTER — Telehealth: Payer: Self-pay

## 2022-02-23 NOTE — Telephone Encounter (Signed)
-----   Message from Earlie Server, MD sent at 02/21/2022  6:24 PM EST ----- ?Please let him know that his white blood cell counts further increased. Recommend to move his appt to be in mid April, instead of currently schedule.  ?

## 2022-02-23 NOTE — Telephone Encounter (Signed)
Spoke with patient and informed him of Dr Collie Siad recommendation to move appointment up due to his recent labs showing white blood cell count increasing. Patient verbalized understanding.  ? ? ?Ladies- please move his Lab/MD on 05/25/2022 to sometime in April. Please let patient know of new appt. Thanks  ?

## 2022-02-24 LAB — FISH HES LEUKEMIA, 4Q12 REA

## 2022-02-24 NOTE — Telephone Encounter (Signed)
Thank you :)

## 2022-02-24 NOTE — Telephone Encounter (Signed)
Just move appointments to April and inform him of new appointments. He is aware.  ?

## 2022-02-27 LAB — MISC LABCORP TEST (SEND OUT): Labcorp test code: 113753

## 2022-03-02 ENCOUNTER — Ambulatory Visit: Payer: Medicare HMO | Admitting: Family

## 2022-03-02 ENCOUNTER — Other Ambulatory Visit: Payer: Self-pay

## 2022-03-03 ENCOUNTER — Ambulatory Visit: Payer: Medicare HMO | Admitting: Gastroenterology

## 2022-04-01 ENCOUNTER — Inpatient Hospital Stay: Payer: Medicare HMO | Attending: Oncology

## 2022-04-01 ENCOUNTER — Encounter: Payer: Self-pay | Admitting: Oncology

## 2022-04-01 ENCOUNTER — Inpatient Hospital Stay: Payer: Medicare HMO | Admitting: Oncology

## 2022-04-06 ENCOUNTER — Encounter: Payer: Self-pay | Admitting: Family

## 2022-04-06 ENCOUNTER — Ambulatory Visit (INDEPENDENT_AMBULATORY_CARE_PROVIDER_SITE_OTHER): Payer: Medicare HMO | Admitting: Family

## 2022-04-06 VITALS — BP 130/76 | HR 56 | Temp 98.3°F | Ht 68.0 in | Wt 131.6 lb

## 2022-04-06 DIAGNOSIS — G4700 Insomnia, unspecified: Secondary | ICD-10-CM | POA: Diagnosis not present

## 2022-04-06 DIAGNOSIS — R5383 Other fatigue: Secondary | ICD-10-CM

## 2022-04-06 DIAGNOSIS — K219 Gastro-esophageal reflux disease without esophagitis: Secondary | ICD-10-CM | POA: Diagnosis not present

## 2022-04-06 DIAGNOSIS — F17200 Nicotine dependence, unspecified, uncomplicated: Secondary | ICD-10-CM | POA: Diagnosis not present

## 2022-04-06 DIAGNOSIS — E538 Deficiency of other specified B group vitamins: Secondary | ICD-10-CM | POA: Diagnosis not present

## 2022-04-06 DIAGNOSIS — Z1211 Encounter for screening for malignant neoplasm of colon: Secondary | ICD-10-CM | POA: Diagnosis not present

## 2022-04-06 MED ORDER — PANTOPRAZOLE SODIUM 20 MG PO TBEC: 20.0000 mg | DELAYED_RELEASE_TABLET | Freq: Every day | ORAL | 2 refills | Status: DC

## 2022-04-06 MED ORDER — CYANOCOBALAMIN 1000 MCG/ML IJ SOLN
1000.0000 ug | Freq: Once | INTRAMUSCULAR | Status: AC
  Administered 2022-04-06: 1000 ug via INTRAMUSCULAR

## 2022-04-06 MED ORDER — TRAZODONE HCL 50 MG PO TABS: 25.0000 mg | ORAL_TABLET | Freq: Every evening | ORAL | 3 refills | Status: DC | PRN

## 2022-04-06 NOTE — Progress Notes (Signed)
? ?Subjective:  ? ? Patient ID: [name], male    DOB: 1951-10-21, 71 y.o.   MRN: [id number] ? ?CC: [name] is a 71 y.o. male who presents today for follow up.  ? ?HPI: Complains of epigastric pain with eating x 3 weeks, comes and goes ?Accompanied by significant other ?Noticed 30 mins after meal and when laying down at night.  Associated with bitter taste in his mouth. ?Describes as 'pressure pain' and fullness.  ?Tried pepcid ac with  relief.  He is not taking Protonix ?Fullness improves with having a BM, belching, passing gas.  ?Normally formed brown BM. No blood in stool. Over the past week, harder to pass stool and occasional liquid brown after coffee ?Gained weight.  Denies fever, chest pain, diaphoresis, nausea, vomiting ?Complains of fatigue for the past couple months.  He has not had a B12 injection which is previously been helpful for him.  Trouble falling and staying asleep.  Denies depression or anxiety.  He works out doing [employer] in his home for a few minutes every day.  No regular aerobic exercise.  ? ?Consult with Dr. Tasia Catchings February 19, 2022 CLL, anemia, concern for progression of CLL.   ? ? ?History of chronic lymphocytic leukemia, peripheral vascular disease ?Smokes cigars ?Due colonoscopy ?EGD 08/10/2015 normal stomach, esophagus; Colonoscopy revealed diverticulosis ? ?CT lung cancer screening 05/2021 ?HISTORY:  ?Past Medical History:  ?Diagnosis Date  ? Anemia 2008  ? Arthritis   ? Cervicalgia   ? Chronic lymphocytic leukemia (CLL), B-cell ([place]) 06/15/2015  ? Dr. Tasia Catchings  ? Hyperlipidemia   ? Low back pain   ? Peripheral vascular disease ([place])   ? ?Past Surgical History:  ?Procedure Laterality Date  ? COLONOSCOPY    ? COLONOSCOPY WITH PROPOFOL N/A 07/23/2015  ? Procedure: COLONOSCOPY WITH PROPOFOL;  Surgeon: [name], MD;  Location: [facility] ENDOSCOPY;  Service: Endoscopy;  Laterality: N/A;  ? ESOPHAGOGASTRODUODENOSCOPY (EGD) WITH PROPOFOL N/A 08/20/2015  ? Procedure: ESOPHAGOGASTRODUODENOSCOPY (EGD)  WITH PROPOFOL;  Surgeon: [name], MD;  Location: [facility] ENDOSCOPY;  Service: Endoscopy;  Laterality: N/A;  ? LOWER EXTREMITY ANGIOGRAPHY Left 05/19/2018  ? Procedure: LOWER EXTREMITY ANGIOGRAPHY;  Surgeon: Algernon Huxley, MD;  Location: [place] CV LAB;  Service: Cardiovascular;  Laterality: Left;  ? LOWER EXTREMITY ANGIOGRAPHY Right 06/15/2018  ? Procedure: LOWER EXTREMITY ANGIOGRAPHY;  Surgeon: Algernon Huxley, MD;  Location: [place] CV LAB;  Service: Cardiovascular;  Laterality: Right;  ? lymp node removal Right 2011  ? arm   left arm 2015  ? LYMPH NODE BIOPSY  08/2014  ? ?Family History  ?Problem Relation Age of Onset  ? Heart attack Father   ? Kidney cancer Neg Hx   ?     lung cancer  ? Bladder Cancer Neg Hx   ? Prostate cancer Neg Hx   ? ? ?Allergies: Penicillins and Latex ?Current Outpatient Medications on File Prior to Visit  ?Medication Sig Dispense Refill  ? aspirin EC 81 MG tablet Take 81 mg by mouth daily. Swallow whole. 90 tablet 3  ? pravastatin (PRAVACHOL) 80 MG tablet Take 1 tablet (80 mg total) by mouth daily. 90 tablet 3  ? gabapentin (NEURONTIN) 300 MG capsule Take by mouth. (Patient not taking: Reported on 04/06/2022)    ? ?No current facility-administered medications on file prior to visit.  ? ? ?Social History  ? ?Tobacco Use  ? Smoking status: Former  ?  Types: Cigars  ? Smokeless tobacco: Never  ?  Tobacco comments:  ?  .5 ppd last year, total of 48.5 pack year  ?Substance Use Topics  ? Alcohol use: No  ? Drug use: No  ? ? ?Review of Systems  ?Constitutional:  Positive for fatigue. Negative for chills, fever and unexpected weight change.  ?Respiratory:  Negative for cough.   ?Cardiovascular:  Negative for chest pain and palpitations.  ?Gastrointestinal:  Positive for constipation. Negative for abdominal distention, abdominal pain, blood in stool, nausea and vomiting.  ?   ?Objective:  ?  ?BP 130/76 (BP Location: Left Arm, Patient Position: Sitting, Cuff Size: Normal)   Pulse (!) 56    Temp 98.3 ?F (36.8 ?C) (Oral)   Ht '5\' 8"'$  (1.727 m)   Wt 131 lb 9.6 oz (59.7 kg)   SpO2 99%   BMI 20.01 kg/m?  ?BP Readings from Last 3 Encounters:  ?04/06/22 130/76  ?02/19/22 126/73  ?01/16/22 122/64  ? ?Wt Readings from Last 3 Encounters:  ?04/06/22 131 lb 9.6 oz (59.7 kg)  ?02/19/22 136 lb 6.4 oz (61.9 kg)  ?01/16/22 135 lb 12.8 oz (61.6 kg)  ? ? ?Physical Exam ?Vitals reviewed.  ?Constitutional:   ?   Appearance: Normal appearance. He is well-developed.  ?Cardiovascular:  ?   Rate and Rhythm: Regular rhythm.  ?   Heart sounds: Normal heart sounds.  ?Pulmonary:  ?   Effort: Pulmonary effort is normal. No respiratory distress.  ?   Breath sounds: Normal breath sounds. No wheezing, rhonchi or rales.  ?Abdominal:  ?   General: Bowel sounds are normal. There is no distension.  ?   Palpations: Abdomen is soft. Abdomen is not rigid. There is no fluid wave or mass.  ?   Tenderness: There is no abdominal tenderness. There is no guarding or rebound. Negative signs include Murphy's sign and McBurney's sign.  ?Skin: ?   General: Skin is warm and dry.  ?Neurological:  ?   Mental Status: He is alert.  ?Psychiatric:     ?   Speech: Speech normal.     ?   Behavior: Behavior normal.  ? ? ?   ?Assessment & Plan:  ? ?Problem List Items Addressed This Visit   ? ?  ? Digestive  ? Gastroesophageal reflux disease - Primary  ?  Presentation consistent with uncontrolled GERD.  Discussed eating small frequent meals.  Trial of Protonix 20 mg every morning.  Alternatively briefly discussed gastroparesis, constipation. Advised starting MiraLAX.  close follow-up to see if symptoms are improving. Consider imaging at follow up.  ? ?  ?  ? Relevant Medications  ? pantoprazole (PROTONIX) 20 MG tablet  ?  ? Other  ? Fatigue (Chronic)  ?  Chronic, poorly controlled. Discussed at length with wife and patient today that I suspect this could be related to progression of CLL and emphasized the utmost importance of continued surveillance and  follow-up with oncology. He also has h/o anemia.  They very much recognize this.  Also discussed lack of exercise and poor sleep at night could very much be contributing to fatigue as well.  Agreed to start trazodone to see if quality of sleep with improved thus fatigue.  B12 injection given to him today.  Close follow-up.  Consider evaluation for sleep apnea if no improvement. Referral for colonoscopy placed again and reiterated the importance of patient having colon cancer screening, particularly in the setting of anemia ? ?  ?  ? B12 deficiency  ? Insomnia  ? Relevant Medications  ?  traZODone (DESYREL) 50 MG tablet  ? ?Other Visit Diagnoses   ? ? Smoker      ? Relevant Orders  ? Ambulatory Referral for Lung Cancer Scre  ? Screen for colon cancer      ? Relevant Orders  ? Ambulatory referral to Gastroenterology  ? ?  ? ? ? ?I have changed [name]'s pantoprazole. I am also having him start on traZODone. Additionally, I am having him maintain his aspirin EC, pravastatin, and gabapentin. We administered cyanocobalamin. ? ? ?Meds ordered this encounter  ?Medications  ? traZODone (DESYREL) 50 MG tablet  ?  Sig: Take 0.5-1 tablets (25-50 mg total) by mouth at bedtime as needed for sleep.  ?  Dispense:  30 tablet  ?  Refill:  3  ?  Order Specific Question:   Supervising Provider  ?  Answer:   Crecencio Mc [[id number]]  ? pantoprazole (PROTONIX) 20 MG tablet  ?  Sig: Take 1 tablet (20 mg total) by mouth daily. Take 1 hour prior to breakfast  ?  Dispense:  30 tablet  ?  Refill:  2  ?  Order Specific Question:   Supervising Provider  ?  Answer:   Crecencio Mc [[id number]]  ? cyanocobalamin ((VITAMIN B-12)) injection 1,000 mcg  ? ? ?Return precautions given.  ? ?Risks, benefits, and alternatives of the medications and treatment plan prescribed today were discussed, and patient expressed understanding.  ? ?Education regarding symptom management and diagnosis given to patient on AVS. ? ?Continue to follow with [name], FNP for routine health maintenance.  ? ?Earney Hamburg and I agreed with plan.  ? ?[name], FNP ? ? ?

## 2022-04-06 NOTE — Assessment & Plan Note (Signed)
Chronic, poorly controlled. Discussed at length with wife and patient today that I suspect this could be related to progression of CLL and emphasized the utmost importance of continued surveillance and follow-up with oncology. He also has h/o anemia.  They very much recognize this.  Also discussed lack of exercise and poor sleep at night could very much be contributing to fatigue as well.  Agreed to start trazodone to see if quality of sleep with improved thus fatigue.  B12 injection given to him today.  Close follow-up.  Consider evaluation for sleep apnea if no improvement. Referral for colonoscopy placed again and reiterated the importance of patient having colon cancer screening, particularly in the setting of anemia ?

## 2022-04-06 NOTE — Patient Instructions (Signed)
As discussed, please make a follow-up with Dr. Tasia Catchings  ? ?referral to [facility] pulmonology for lung cancer screening program.  Also placed referral for colonoscopy Let us know if you dont hear back within a week in regards to an appointment being scheduled.  ?start trazodone nightly to aid with sleep.   may take 1 hour prior to bedtime ?Also refilled Protonix.  Please let me know if abdominal pain does not resolve. ? ?Constipation plan ? ?1) start taking MiraLAX, half a dose every day or every other day and change the dose as needed after 3 to 5 days with goal of 1-2 soft bowel movements every day or every other day. MiraLAX is an osmotic laxative. That means it draws water into the colon, which softens the stool and may naturally stimulate the colon to contract. These actions help ease bowel movements. For example, you  may find that using the medication every other day or three times a week is a good bowel regimen for you. Or perhaps, twice weekly.  ? ?2)  If you do not get results with Miralax alone, you may then add Bisacodyl (dulocolax) suppository daily to regimen until you get desired bowel results. Please remember that stimulant laxatives such as senna and bisacodyl (above) trigger contractions in the bowels that push the stool along. But if you take stimulant laxatives too often, you could become dependent on them to have a bowel movement at all--possibly because the bowel has stopped functioning normally. Please let me know if you are having to use bisacodyl on a regular basis.  ? ?3) Take Colace ( stool softener) twice daily every day.  ? ?It is MOST important to drink LOTS of water and follow a HIGH fiber diet to keep foods moving through the gut. You may add Metamucil to a beverage that you drink. ? ?Information on prevention of constipation as well as acute treatment for constipation as included below. ? ?If there is no improvement in your symptoms, or if there is any worsening of symptoms, or if you have  any additional concerns, please return to this clinic for re-evaluation; or, if we are closed, consider going to the Emergency Room for evaluation. ? ? ? ?Constipation Prevention ?What is Constipation? ?Constipation is hard, dry bowel movements or the inability to have a bowel movement.  You can also feel like you need to have a bowel movement but not be able to.  It can also be painful when you strain to have a bowel movement.  Taking narcotic pain medicine after surgery can make you constipated, even if you have never had a problem with constipation. ?What Do I Need To Do? ?The best thing to do for constipation is to keep it from happening.  This can be done by: ?Adding laxatives to your daily routine, when taking prescription pain medicines after surgery. Add 17 gm Miralax daily or 100 mg Colace once or twice daily. (Miralax is mixed in water. Colace is a pill). They soften your bowel movements to make them easier to pass and hurt less. ?Drink plenty of water to help flush your bowels.  (Eight, 8 ounce glasses daily) ?Eat foods high in fiber such as whole grains, vegetables, cereals, fruits, and prune juice (5-7 servings a day or 25 grams).  If you do not know how much fiber a food has in it you can look on the label under ?dietary fiber.?  ?If you have trouble getting enough fiber in your diet you may want to  consider a fiber supplement such as Metamucil or Citrucel.  Also, be aware that eating fiber without drinking enough water can make constipation worse. ?If you do become constipated some medications that may help are: ?Bisacodyl (Dulcolax) is available in tablet form or a suppository. ?Glycerin suppositories are also a good choice if you need a fast acting medication. ?Everybody is different and may have different results.  Talk to your pharmacist or health care provider about your specific problems. They can help you choose the best product for you.  ?Why Is It Important for Me To Do This? ?Being  constipated is not something you have to live with.  There are many things you can do to help.   Feeling bad can interfere with your recovery after surgery.  If constipation goes on for too long it can become a very serious medical problem. You may need to visit your doctor or go to the hospital.  That is why it is very important to drink lots of water, eat enough fiber, and keep it from happening.  ?Ask Questions ?We want to answer all of your questions and concerns.  That?s why we encourage you to use a program called Ask Me 3?, created by the [employer].  By using Ask Me 3? you are encouraged to ask 3 simple (yet, potentially life saving questions) whenever you are talking with your physician, nurse or pharmacist: ?What is my main problem? ?What do I need to do? ?Why is it important for me to do this? ?By understanding the answer to these three questions and any other questions you may have, you have the knowledge necessary to manage your health. Please feel very comfortable asking any questions. Healthcare is complicated, so if you hear an answer you do not understand, please ask your health care team to explain again. ?  ?Sources: ?[employer] ?Medline Plus ?10-09-10 ?N ? ?

## 2022-04-06 NOTE — Assessment & Plan Note (Addendum)
Presentation consistent with uncontrolled GERD.  Discussed eating small frequent meals.  Trial of Protonix 20 mg every morning.  Alternatively briefly discussed gastroparesis, constipation. Advised starting MiraLAX.  close follow-up to see if symptoms are improving. Consider imaging at follow up.  ?

## 2022-04-08 ENCOUNTER — Other Ambulatory Visit: Payer: Self-pay

## 2022-04-08 DIAGNOSIS — Z1211 Encounter for screening for malignant neoplasm of colon: Secondary | ICD-10-CM

## 2022-04-08 MED ORDER — PEG 3350-KCL-NA BICARB-NACL 420 G PO SOLR: 4000.0000 mL | Freq: Once | ORAL | 0 refills | Status: AC

## 2022-04-08 NOTE — Progress Notes (Signed)
Gastroenterology Pre-Procedure Review ? ?Request Date: 04/20/22 ?Requesting Physician: Dr. Vicente Males ? ?PATIENT REVIEW QUESTIONS: The patient responded to the following health history questions as indicated:   ? ?1. Are you having any GI issues? yes (feels pressure after eating unable to rest well at this time. Declined office visit) ?2. Do you have a personal history of Polyps?  Unsure it has been 10 years thinks it was done at [facility] ?3. Do you have a family history of Colon Cancer or Polyps? no ?4. Diabetes Mellitus? no ?5. Joint replacements in the past 12 months?no ?6. Major health problems in the past 3 months? Leukemia (not a new diagnosis) ?7. Any artificial heart valves, MVP, or defibrillator?no ?   ?MEDICATIONS & ALLERGIES:    ?Patient reports the following regarding taking any anticoagulation/antiplatelet therapy:   ?Plavix, Coumadin, Eliquis, Xarelto, Lovenox, Pradaxa, Brilinta, or Effient? no ?Aspirin? yes (81 mg daily) ? ?Patient confirms/reports the following medications:  ?Current Outpatient Medications  ?Medication Sig Dispense Refill  ? aspirin EC 81 MG tablet Take 81 mg by mouth daily. Swallow whole. 90 tablet 3  ? gabapentin (NEURONTIN) 300 MG capsule Take by mouth. (Patient not taking: Reported on 04/06/2022)    ? pantoprazole (PROTONIX) 20 MG tablet Take 1 tablet (20 mg total) by mouth daily. Take 1 hour prior to breakfast 30 tablet 2  ? pravastatin (PRAVACHOL) 80 MG tablet Take 1 tablet (80 mg total) by mouth daily. 90 tablet 3  ? traZODone (DESYREL) 50 MG tablet Take 0.5-1 tablets (25-50 mg total) by mouth at bedtime as needed for sleep. 30 tablet 3  ? ?No current facility-administered medications for this visit.  ? ? ?Patient confirms/reports the following allergies:  ?Allergies  ?Allergen Reactions  ? Penicillins   ?  Other reaction(s): Rash, Hives ()  ? Latex Rash  ? ? ?No orders of the defined types were placed in this encounter. ? ? ?AUTHORIZATION INFORMATION ?Primary Insurance: ?1D#: ?Group  #: ? ?Secondary Insurance: ?1D#: ?Group #: ? ?SCHEDULE INFORMATION: ?Date: 04/20/22 ?Time: ?Location: [facility] ?

## 2022-04-20 ENCOUNTER — Encounter
Admission: RE | Disposition: A | Discharge status: Home / Self Care | Payer: Self-pay | Source: Home / Self Care | Attending: Gastroenterology

## 2022-04-20 ENCOUNTER — Ambulatory Visit: Payer: Medicare HMO | Admitting: Registered Nurse

## 2022-04-20 ENCOUNTER — Ambulatory Visit
Admission: RE | Admit: 2022-04-20 | Discharge: 2022-04-20 | Disposition: A | Discharge status: Home / Self Care | Payer: Medicare HMO | Attending: Gastroenterology | Admitting: Gastroenterology

## 2022-04-20 ENCOUNTER — Encounter: Payer: Self-pay | Admitting: Gastroenterology

## 2022-04-20 DIAGNOSIS — D122 Benign neoplasm of ascending colon: Secondary | ICD-10-CM | POA: Insufficient documentation

## 2022-04-20 DIAGNOSIS — F32A Depression, unspecified: Secondary | ICD-10-CM | POA: Insufficient documentation

## 2022-04-20 DIAGNOSIS — D123 Benign neoplasm of transverse colon: Secondary | ICD-10-CM | POA: Insufficient documentation

## 2022-04-20 DIAGNOSIS — K635 Polyp of colon: Secondary | ICD-10-CM

## 2022-04-20 DIAGNOSIS — Z1211 Encounter for screening for malignant neoplasm of colon: Secondary | ICD-10-CM | POA: Diagnosis not present

## 2022-04-20 DIAGNOSIS — F17290 Nicotine dependence, other tobacco product, uncomplicated: Secondary | ICD-10-CM | POA: Diagnosis not present

## 2022-04-20 DIAGNOSIS — E785 Hyperlipidemia, unspecified: Secondary | ICD-10-CM | POA: Diagnosis not present

## 2022-04-20 DIAGNOSIS — K219 Gastro-esophageal reflux disease without esophagitis: Secondary | ICD-10-CM | POA: Insufficient documentation

## 2022-04-20 DIAGNOSIS — D125 Benign neoplasm of sigmoid colon: Secondary | ICD-10-CM | POA: Diagnosis not present

## 2022-04-20 DIAGNOSIS — Z856 Personal history of leukemia: Secondary | ICD-10-CM | POA: Diagnosis not present

## 2022-04-20 HISTORY — PX: COLONOSCOPY WITH PROPOFOL: SHX5780

## 2022-04-20 SURGERY — COLONOSCOPY WITH PROPOFOL
Anesthesia: General

## 2022-04-20 MED ORDER — PROPOFOL 10 MG/ML IV BOLUS
INTRAVENOUS | Status: DC | PRN
  Administered 2022-04-20 (×2): 10 mg via INTRAVENOUS
  Administered 2022-04-20: 70 mg via INTRAVENOUS
  Administered 2022-04-20: 10 mg via INTRAVENOUS

## 2022-04-20 MED ORDER — PROPOFOL 500 MG/50ML IV EMUL
INTRAVENOUS | Status: AC
  Filled 2022-04-20: qty 50

## 2022-04-20 MED ORDER — PROPOFOL 500 MG/50ML IV EMUL
INTRAVENOUS | Status: DC | PRN
  Administered 2022-04-20: 125 ug/kg/min via INTRAVENOUS

## 2022-04-20 MED ORDER — LIDOCAINE HCL (CARDIAC) PF 100 MG/5ML IV SOSY
PREFILLED_SYRINGE | INTRAVENOUS | Status: DC | PRN
Start: 2022-04-20 — End: 2022-04-20
  Administered 2022-04-20: 40 mg via INTRAVENOUS

## 2022-04-20 MED ORDER — SODIUM CHLORIDE 0.9 % IV SOLN: INTRAVENOUS | Status: DC

## 2022-04-20 NOTE — Op Note (Signed)
[facility] ?Gastroenterology ?Patient Name: [name] ?Procedure Date: 04/20/2022 10:19 AM ?MRN: [id number] ?Account #: [id number] ?Date of Birth: 1951-04-16 ?Admit Type: Outpatient ?Age: 71 ?Room: [facility] ENDO ROOM 4 ?Gender: Male ?Note Status: Finalized ?Instrument Name: Colonoscope [id number] ?Procedure:             Colonoscopy ?Indications:           Screening for colorectal malignant neoplasm ?Providers:             Jonathon Bellows MD, MD ?Referring MD:          Yvetta Coder. Arnett (Referring MD) ?Medicines:             Monitored Anesthesia Care ?Complications:         No immediate complications. ?Procedure:             Pre-Anesthesia Assessment: ?                       - Prior to the procedure, a History and Physical was  ?                       performed, and patient medications, allergies and  ?                       sensitivities were reviewed. The patient's tolerance  ?                       of previous anesthesia was reviewed. ?                       - The risks and benefits of the procedure and the  ?                       sedation options and risks were discussed with the  ?                       patient. All questions were answered and informed  ?                       consent was obtained. ?                       - ASA Grade Assessment: II - A patient with mild  ?                       systemic disease. ?                       After obtaining informed consent, the colonoscope was  ?                       passed under direct vision. Throughout the procedure,  ?                       the patient's blood pressure, pulse, and oxygen  ?                       saturations were monitored continuously. The  ?                       Colonoscope was introduced  through the anus and  ?                       advanced to the the cecum, identified by the  ?                       appendiceal orifice. The colonoscopy was performed  ?                       with ease. The patient tolerated the procedure well.  ?                        The quality of the bowel preparation was excellent. ?Findings: ?     The perianal and digital rectal examinations were normal. ?     Two sessile polyps were found in the ascending colon. The polyps were 5  ?     to 7 mm in size. These polyps were removed with a cold snare. Resection  ?     and retrieval were complete. ?     A 5 mm polyp was found in the transverse colon. The polyp was sessile.  ?     The polyp was removed with a cold snare. Resection and retrieval were  ?     complete. ?     A 10 mm polyp was found in the sigmoid colon. The polyp was  ?     semi-pedunculated. The polyp was removed with a hot snare. Resection and  ?     retrieval were complete. ?     The exam was otherwise without abnormality on direct and retroflexion  ?     views. ?Impression:            - Two 5 to 7 mm polyps in the ascending colon, removed  ?                       with a cold snare. Resected and retrieved. ?                       - One 5 mm polyp in the transverse colon, removed with  ?                       a cold snare. Resected and retrieved. ?                       - One 10 mm polyp in the sigmoid colon, removed with a  ?                       hot snare. Resected and retrieved. ?                       - The examination was otherwise normal on direct and  ?                       retroflexion views. ?Recommendation:        - Discharge patient to home (with escort). ?                       - Resume previous diet. ?                       -  Continue present medications. ?                       - Await pathology results. ?                       - Repeat colonoscopy for surveillance based on  ?                       pathology results. ?Procedure Code(s):     --- Professional --- ?                       [phone], Colonoscopy, flexible; with removal of  ?                       tumor(s), polyp(s), or other lesion(s) by snare  ?                       technique ?Diagnosis Code(s):     --- Professional --- ?                        K63.5, Polyp of colon ?                       Z12.11, Encounter for screening for malignant neoplasm  ?                       of colon ?CPT copyright 2019 American Medical Association. All rights reserved. ?The codes documented in this report are preliminary and upon coder review may  ?be revised to meet current compliance requirements. ?Jonathon Bellows, MD ?Jonathon Bellows MD, MD ?04/20/2022 10:48:33 AM ?This report has been signed electronically. ?Number of Addenda: 0 ?Note Initiated On: 04/20/2022 10:19 AM ?Scope Withdrawal Time: 0 hours 15 minutes 6 seconds  ?Total Procedure Duration: 0 hours 17 minutes 15 seconds  ?Estimated Blood Loss:  Estimated blood loss: none. ?     [facility] ?

## 2022-04-20 NOTE — H&P (Signed)
? ? ? ?Jonathon Bellows, MD ?[address] [place], [place], [place], [address] ?[address] [place], [place], [place], [address] ?Phone: [phone]  ?Fax: [phone] ? ?Primary Care Physician:  [name], FNP ? ? ?Pre-Procedure History & Physical: ?HPI:  [name] is a 71 y.o. male is here for an colonoscopy. ?  ?Past Medical History:  ?Diagnosis Date  ? Anemia 2008  ? Arthritis   ? Cervicalgia   ? 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 Dr. Tasia Catchings  ? Hyperlipidemia   ? Low back pain   ? Peripheral vascular disease ([place])   ? ? ?Past Surgical History:  ?Procedure Laterality Date  ? COLONOSCOPY    ? COLONOSCOPY WITH PROPOFOL N/A 07/23/2015  ? Procedure: COLONOSCOPY WITH PROPOFOL;  Surgeon: [name], MD;  Location: [facility] ENDOSCOPY;  Service: Endoscopy;  Laterality: N/A;  ? ESOPHAGOGASTRODUODENOSCOPY (EGD) WITH PROPOFOL N/A 08/20/2015  ? Procedure: ESOPHAGOGASTRODUODENOSCOPY (EGD) WITH PROPOFOL;  Surgeon: [name], MD;  Location: [facility] ENDOSCOPY;  Service: Endoscopy;  Laterality: N/A;  ? LOWER EXTREMITY ANGIOGRAPHY Left 05/19/2018  ? Procedure: LOWER EXTREMITY ANGIOGRAPHY;  Surgeon: Algernon Huxley, MD;  Location: [facility] CV LAB;  Service: Cardiovascular;  Laterality: Left;  ? LOWER EXTREMITY ANGIOGRAPHY Right 06/15/2018  ? Procedure: LOWER EXTREMITY ANGIOGRAPHY;  Surgeon: Algernon Huxley, MD;  Location: [place] LAB;  Service: Cardiovascular;  Laterality: Right;  ? lymp node removal Right 2011  ? arm   left arm 2015  ? LYMPH NODE BIOPSY  08/2014  ? ? ?Prior to Admission medications   ?Medication Sig Start Date End Date Taking? Authorizing Provider  ?pantoprazole (PROTONIX) 20 MG tablet Take 1 tablet (20 mg total) by mouth daily. Take 1 hour prior to breakfast 04/06/22  Yes Arnett, Yvetta Coder, FNP  ?traZODone (DESYREL) 50 MG tablet Take 0.5-1 tablets (25-50 mg total) by mouth at bedtime as needed for sleep. 04/06/22  Yes [name], FNP  ?aspirin EC 81 MG tablet  Take 81 mg by mouth daily. Swallow whole. 05/01/21   Minna Merritts, MD  ?gabapentin (NEURONTIN) 300 MG capsule Take by mouth. ?Patient not taking: Reported on 04/06/2022 09/02/17   [provider]  ?pravastatin (PRAVACHOL) 80 MG tablet Take 1 tablet (80 mg total) by mouth daily. ?Patient not taking: Reported on 04/20/2022 01/16/22   [name], FNP  ? ? ?Allergies as of 04/09/2022 - Review Complete 04/06/2022  ?Allergen Reaction Noted  ? Penicillins  06/22/2021  ? Latex Rash 10/11/2014  ? ? ?Family History  ?Problem Relation Age of Onset  ? Heart attack Father   ? Kidney cancer Neg Hx   ?     lung cancer  ? Bladder Cancer Neg Hx   ? Prostate cancer Neg Hx   ? ? ?Social History  ? ?Socioeconomic History  ? Marital status: Married  ?  Spouse name: Not on file  ? Number of children: Not on file  ? Years of education: Not on file  ? Highest education level: Not on file  ?Occupational History  ? Not on file  ?Tobacco Use  ? Smoking status: Former  ?  Types: Cigars  ? Smokeless tobacco: Never  ? Tobacco comments:  ?  .5 ppd last year, total of 48.5 pack year  ?Vaping Use  ? Vaping Use: Never used  ?Substance and Sexual Activity  ? Alcohol use: No  ? Drug use: No  ? Sexual activity: Yes  ?Other  Topics Concern  ? Not on file  ?Social History Narrative  ? 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   ? Let go from [facility] 11/2019 unexpectedly  ? Sons 2, daughters 4  ? 10 grandchildren  ? ?Social Determinants of Health  ? ?Financial Resource Strain: Not on file  ?Food Insecurity: Not on file  ?Transportation Needs: Not on file  ?Physical Activity: Not on file  ?Stress: Not on file  ?Social Connections: Not on file  ?Intimate Partner Violence: Not on file  ? ? ?Review of Systems: ?See HPI, otherwise negative ROS ? ?Physical Exam: ?BP 136/87   Pulse (!) 56   Temp (!) 97 ?F (36.1 ?C)   Resp 16   Ht '5\' 8"'$  (1.727 m)   Wt 59 kg   SpO2 100%   BMI 19.77 kg/m?  ?General:   Alert,  pleasant and cooperative in  NAD ?Head:  Normocephalic and atraumatic. ?Neck:  Supple; no masses or thyromegaly. ?Lungs:  Clear throughout to auscultation, normal respiratory effort.    ?Heart:  +S1, +S2, Regular rate and rhythm, No edema. ?Abdomen:  Soft, nontender and nondistended. Normal bowel sounds, without guarding, and without rebound.   ?Neurologic:  Alert and  oriented x4;  grossly normal neurologically. ? ?Impression/Plan: ?[name] is here for an colonoscopy to be performed for Screening colonoscopy average risk   ?Risks, benefits, limitations, and alternatives regarding  colonoscopy have been reviewed with the patient.  Questions have been answered.  All parties agreeable. ? ? ?Jonathon Bellows, MD  04/20/2022, 10:11 AM ? ?

## 2022-04-20 NOTE — Anesthesia Preprocedure Evaluation (Signed)
Anesthesia Evaluation  ?Patient identified by MRN, date of birth, ID band ?Patient awake ? ? ? ?Reviewed: ?Allergy & Precautions, NPO status , Patient's Chart, lab work & pertinent test results ? ?History of Anesthesia Complications ?Negative for: history of anesthetic complications ? ?Airway ?Mallampati: II ? ?TM Distance: >3 FB ?Neck ROM: Full ? ? ? Dental ? ?(+) Lower Dentures, Upper Dentures ?  ?Pulmonary ?neg sleep apnea, COPD, Current Smoker and Patient abstained from smoking.,  ?  ?Pulmonary exam normal ?breath sounds clear to auscultation ? ? ? ? ? ? Cardiovascular ?Exercise Tolerance: Good ?METS(-) hypertension+ CAD and + Peripheral Vascular Disease  ?(-) Past MI Normal cardiovascular exam(-) dysrhythmias  ?Rhythm:Regular Rate:Normal ?- Systolic murmurs ? ?  ?Neuro/Psych ?PSYCHIATRIC DISORDERS Depression negative neurological ROS ?   ? GI/Hepatic ?Neg liver ROS, GERD  ,  ?Endo/Other  ?negative endocrine ROSneg diabetes ? Renal/GU ?negative Renal ROS  ? ?  ?Musculoskeletal ? ? Abdominal ?Normal abdominal exam  (+)   ?Peds ?negative pediatric ROS ?(+)  Hematology ? ?(+) Blood dyscrasia, anemia , CLL with hemolytic anemia   ?Anesthesia Other Findings ?Past Medical History: ?2008: Anemia ?No date: Arthritis ?No date: Cervicalgia ?06/15/2015: Chronic lymphocytic leukemia (CLL), B-cell ([place]) ?    Comment:  Dr. Tasia Catchings ?No date: Hyperlipidemia ?No date: Low back pain ?No date: Peripheral vascular disease ([place]) ? Reproductive/Obstetrics ? ?  ? ? ? ? ? ? ? ? ? ? ? ? ? ?  ?  ? ? ? ? ? ? ? ? ?Anesthesia Physical ? ?Anesthesia Plan ? ?ASA: 3 ? ?Anesthesia Plan: General  ? ?Post-op Pain Management: Minimal or no pain anticipated  ? ?Induction: Intravenous ? ?PONV Risk Score and Plan: 1 and Propofol infusion, TIVA and Ondansetron ? ?Airway Management Planned: Nasal Cannula ? ?Additional Equipment: None ? ?Intra-op Plan:  ? ?Post-operative Plan:  ? ?Informed Consent: I have reviewed the  patients History and Physical, chart, labs and discussed the procedure including the risks, benefits and alternatives for the proposed anesthesia with the patient or authorized representative who has indicated his/her understanding and acceptance.  ? ? ? ?Dental advisory given ? ?Plan Discussed with: CRNA ? ?Anesthesia Plan Comments: (Discussed risks of anesthesia with patient, including possibility of difficulty with spontaneous ventilation under anesthesia necessitating airway intervention, PONV, and rare risks such as cardiac or respiratory or neurological events, and allergic reactions. Discussed the role of CRNA in patient's perioperative care. Patient understands. ?Patient counseled on benefits of smoking cessation, and increased perioperative risks associated with continued smoking. ?)  ? ? ? ? ? ? ?Anesthesia Quick Evaluation ? ?

## 2022-04-20 NOTE — Transfer of Care (Signed)
Immediate Anesthesia Transfer of Care Note ? ?Patient: [name] ? ?Procedure(s) Performed: Procedure(s): ?COLONOSCOPY WITH PROPOFOL (N/A) ? ?Patient Location: PACU and Endoscopy Unit ? ?Anesthesia Type:General ? ?Level of Consciousness: sedated ? ?Airway & Oxygen Therapy: Patient Spontanous Breathing and Patient connected to nasal cannula oxygen ? ?Post-op Assessment: Report given to RN and Post -op Vital signs reviewed and stable ? ?Post vital signs: Reviewed and stable ? ?Last Vitals:  ?Vitals:  ? 04/20/22 1044 04/20/22 1045  ?BP: 125/71 125/71  ?Pulse:  (!) 55  ?Resp:  (!) 23  ?Temp: (!) 36.2 ?C   ?SpO2:  100%  ? ? ?Complications: No apparent anesthesia complications ?

## 2022-04-20 NOTE — Anesthesia Postprocedure Evaluation (Signed)
Anesthesia Post Note ? ?Patient: [name] ? ?Procedure(s) Performed: COLONOSCOPY WITH PROPOFOL ? ?Patient location during evaluation: Endoscopy ?Anesthesia Type: General ?Level of consciousness: awake and alert ?Pain management: pain level controlled ?Vital Signs Assessment: post-procedure vital signs reviewed and stable ?Respiratory status: spontaneous breathing, nonlabored ventilation, respiratory function stable and patient connected to nasal cannula oxygen ?Cardiovascular status: blood pressure returned to baseline and stable ?Postop Assessment: no apparent nausea or vomiting ?Anesthetic complications: no ? ? ?No notable events documented. ? ? ?Last Vitals:  ?Vitals:  ? 04/20/22 1054 04/20/22 1104  ?BP: 113/79 (!) 151/90  ?Pulse:    ?Resp:    ?Temp:    ?SpO2:    ?  ?Last Pain:  ?Vitals:  ? 04/20/22 1104  ?TempSrc:   ?PainSc: 0-No pain  ? ? ?  ?  ?  ?  ?  ?  ? ?Arita Miss ? ? ? ? ?

## 2022-04-21 ENCOUNTER — Encounter: Payer: Self-pay | Admitting: Gastroenterology

## 2022-04-21 LAB — SURGICAL PATHOLOGY

## 2022-04-22 ENCOUNTER — Encounter: Payer: Self-pay | Admitting: Gastroenterology

## 2022-04-29 ENCOUNTER — Encounter: Payer: Self-pay | Admitting: Family

## 2022-04-29 ENCOUNTER — Ambulatory Visit (INDEPENDENT_AMBULATORY_CARE_PROVIDER_SITE_OTHER): Payer: Medicare HMO | Admitting: Family

## 2022-04-29 VITALS — BP 122/74 | HR 67 | Temp 97.9°F | Ht 68.0 in | Wt 131.0 lb

## 2022-04-29 DIAGNOSIS — E538 Deficiency of other specified B group vitamins: Secondary | ICD-10-CM | POA: Diagnosis not present

## 2022-04-29 DIAGNOSIS — R5383 Other fatigue: Secondary | ICD-10-CM

## 2022-04-29 DIAGNOSIS — L290 Pruritus ani: Secondary | ICD-10-CM

## 2022-04-29 DIAGNOSIS — C9110 Chronic lymphocytic leukemia of B-cell type not having achieved remission: Secondary | ICD-10-CM | POA: Diagnosis not present

## 2022-04-29 MED ORDER — HYDROCORTISONE 1 % EX CREA: 1.0000 "application " | TOPICAL_CREAM | Freq: Two times a day (BID) | CUTANEOUS | 0 refills | Status: DC

## 2022-04-29 MED ORDER — CYANOCOBALAMIN 1000 MCG/ML IJ SOLN
1000.0000 ug | Freq: Once | INTRAMUSCULAR | Status: AC
  Administered 2022-04-29: 1000 ug via INTRAMUSCULAR

## 2022-04-29 NOTE — Progress Notes (Signed)
? ?Subjective:  ? ? [name] ID: [name], male    DOB: September 09, 1951, 71 y.o.   MRN: [id number] ? ?CC: [name] is a 70 y.o. male who presents today for an acute visit.   ? ?HPI: Bilateral painful mass in groin  x 10 days, getting bigger in size. Left side is larger and more painful.  ? ?[name] has noticed on both sides. Not draining. No fever, dysuria, penile discharge.  ? ?H/O CLL. Following with Dr Tasia Catchings ? ?Rectal itching x 9 days ago. Onset after colonoscopy ?No rash, hemorrhoids, rectal bleeding, diarrhea, fecal incontinence.   ? ?Allergic to latex ? ?Colonoscopy 04/20/22 ?Polyps seen ?Repeat in 3 years ? ?B12 deficiency-[name] continues B12 injections monthly which has been helpful for energy. Due today.  ? ?Insomnia- sleeping better on trazodone '50mg'$ . Energy has improved.  ? ?CT abdomen and pelvis 07/21/2017 shows bulky mesenteric lymphadenopathy, stable lymphadenopathy in bilateral axillary regions, abdominal retroperitoneal and bilateral external iliac chains ? ?HISTORY:  ?Past Medical History:  ?Diagnosis Date  ? Anemia 2008  ? Arthritis   ? Cervicalgia   ? 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 Dr. Tasia Catchings  ? Hyperlipidemia   ? Low back pain   ? Peripheral vascular disease ([place])   ? ?Past Surgical History:  ?Procedure Laterality Date  ? COLONOSCOPY    ? COLONOSCOPY WITH PROPOFOL N/A 07/23/2015  ? Procedure: COLONOSCOPY WITH PROPOFOL;  Surgeon: [name], MD;  Location: [facility] ENDOSCOPY;  Service: Endoscopy;  Laterality: N/A;  ? COLONOSCOPY WITH PROPOFOL N/A 04/20/2022  ? Procedure: COLONOSCOPY WITH PROPOFOL;  Surgeon: Jonathon Bellows, MD;  Location: [facility] ENDOSCOPY;  Service: Gastroenterology;  Laterality: N/A;  ? ESOPHAGOGASTRODUODENOSCOPY (EGD) WITH PROPOFOL N/A 08/20/2015  ? Procedure: ESOPHAGOGASTRODUODENOSCOPY (EGD) WITH PROPOFOL;  Surgeon: [name], MD;  Location: [facility] ENDOSCOPY;  Service: Endoscopy;  Laterality: N/A;  ? LOWER EXTREMITY ANGIOGRAPHY Left 05/19/2018  ? Procedure: LOWER  EXTREMITY ANGIOGRAPHY;  Surgeon: Algernon Huxley, MD;  Location: [place] CV LAB;  Service: Cardiovascular;  Laterality: Left;  ? LOWER EXTREMITY ANGIOGRAPHY Right 06/15/2018  ? Procedure: LOWER EXTREMITY ANGIOGRAPHY;  Surgeon: Algernon Huxley, MD;  Location: [place] CV LAB;  Service: Cardiovascular;  Laterality: Right;  ? lymp node removal Right 2011  ? arm   left arm 2015  ? LYMPH NODE BIOPSY  08/2014  ? ?Family History  ?Problem Relation Age of Onset  ? Heart attack Father   ? Kidney cancer Neg Hx   ?     lung cancer  ? Bladder Cancer Neg Hx   ? Prostate cancer Neg Hx   ? ? ?Allergies: Penicillins and Latex ?Current Outpatient Medications on File Prior to Visit  ?Medication Sig Dispense Refill  ? aspirin EC 81 MG tablet Take 81 mg by mouth daily. Swallow whole. 90 tablet 3  ? pantoprazole (PROTONIX) 20 MG tablet Take 1 tablet (20 mg total) by mouth daily. Take 1 hour prior to breakfast 30 tablet 2  ? traZODone (DESYREL) 50 MG tablet Take 0.5-1 tablets (25-50 mg total) by mouth at bedtime as needed for sleep. 30 tablet 3  ? gabapentin (NEURONTIN) 300 MG capsule Take by mouth. ([name] not taking: Reported on 04/06/2022)    ? pravastatin (PRAVACHOL) 80 MG tablet Take 1 tablet (80 mg total) by mouth daily. ([name] not taking: Reported on 04/20/2022) 90 tablet 3  ? ?No current facility-administered medications on file prior to visit.  ? ? ?Social History  ? ?Tobacco Use  ?  Smoking status: Former  ?  Types: Cigars  ? Smokeless tobacco: Never  ? Tobacco comments:  ?  .5 ppd last year, total of 48.5 pack year  ?Vaping Use  ? Vaping Use: Never used  ?Substance Use Topics  ? Alcohol use: No  ? Drug use: No  ? ? ?Review of Systems  ?Constitutional:  Negative for chills and fever.  ?Respiratory:  Negative for cough.   ?Cardiovascular:  Negative for chest pain and palpitations.  ?Gastrointestinal:  Negative for blood in stool, diarrhea, nausea and vomiting.  ?Genitourinary:  Negative for dysuria.  ?Hematological:  Positive  for adenopathy.  ?   ?Objective:  ?  ?BP 122/74 (BP Location: Left Arm, [name] Position: Sitting, Cuff Size: Normal)   Pulse 67   Temp 97.9 ?F (36.6 ?C) (Oral)   Ht '5\' 8"'$  (1.727 m)   Wt 131 lb (59.4 kg)   SpO2 98%   BMI 19.92 kg/m?  ? ? ?Physical Exam ?Vitals reviewed.  ?Constitutional:   ?   Appearance: [name] is well-developed.  ?Cardiovascular:  ?   Rate and Rhythm: Regular rhythm.  ?   Heart sounds: Normal heart sounds.  ?Pulmonary:  ?   Effort: Pulmonary effort is normal. No respiratory distress.  ?   Breath sounds: Normal breath sounds. No wheezing, rhonchi or rales.  ?Genitourinary: ?   Comments: No external hemorrhoids or rash appreciated. ?Lymphadenopathy:  ?   Lower Body: Right inguinal adenopathy present. Left inguinal adenopathy present.  ?   Comments: Left inguinal tender non fluctuant mass palpated. Right sided smaller mass , non tender.  ?No erythema, red streaks.  ?No enlargement of mass with coughing. ?  ?Skin: ?   General: Skin is warm and dry.  ?Neurological:  ?   Mental Status: [name] is alert.  ?Psychiatric:     ?   Speech: Speech normal.     ?   Behavior: Behavior normal.  ? ? ?   ?Assessment & Plan:  ? ?Problem List Items Addressed This Visit   ? ?  ? Digestive  ? Rectal itching  ?  Onset after colonoscopy.Advised to start  barrier cream containing zinc oxide and 1 percent hydrocortisone cream twice daily for one to two weeks to be used in conjunction with the barrier cream. Advised not to use topical hydrocortisone for more than two weeks to avoid skin atrophy. ? ?  ?  ? Relevant Medications  ? hydrocortisone cream 1 %  ?  ? Other  ? Fatigue (Chronic)  ?  Fatigue appears improved today with sleeping better while on trazodone 50 mg.  [name] also takes B12 which [name] feels has been helpful.  We will continue B12 monthly injections for now ? ?  ?  ? B12 deficiency - Primary  ? Relevant Medications  ? cyanocobalamin ((VITAMIN B-12)) injection 1,000 mcg  ? CLL (chronic lymphocytic leukemia) ([place])  ?   Concern for progression of lymphadenopathy. Suspected bilateral inguinal lymphadenopathy on exam today. No symptoms of abscess, otherwise infectious etiology. Non reducible. Alternately would consider inguinal hernia.  Reviewed previous CT abdomen and pelvis 2018 and no mention of inguinal lymphadenopathy, hernia. We have made [name] appointment with Dr Tasia Catchings tomorrow for evaluation based on clinical concern for CLL progression. Will follow closely.  ? ?  ?  ? ? ? ? ?I am having Earney Hamburg maintain his aspirin EC, pravastatin, gabapentin, traZODone, and pantoprazole. ? ? ?No orders of the defined types were placed in this encounter. ? ? ?  Return precautions given.  ? ?Risks, benefits, and alternatives of the medications and treatment plan prescribed today were discussed, and [name] expressed understanding.  ? ?Education regarding symptom management and diagnosis given to [name] on AVS. ? ?Continue to follow with [name], FNP for routine health maintenance.  ? ?Earney Hamburg and I agreed with plan.  ? ?[name], FNP ? ?

## 2022-04-29 NOTE — Assessment & Plan Note (Signed)
Onset after colonoscopy.Advised to start  barrier cream containing zinc oxide and 1 percent hydrocortisone cream twice daily for one to two weeks to be used in conjunction with the barrier cream. Advised not to use topical hydrocortisone for more than two weeks to avoid skin atrophy. ?

## 2022-04-29 NOTE — Assessment & Plan Note (Addendum)
Concern for progression of lymphadenopathy. Suspected bilateral inguinal lymphadenopathy on exam today. No symptoms of abscess, otherwise infectious etiology. Non reducible. Alternately would consider inguinal hernia.  Reviewed previous CT abdomen and pelvis 2018 and no mention of inguinal lymphadenopathy, hernia. We have made patient appointment with Dr Tasia Catchings tomorrow for evaluation based on clinical concern for CLL progression. Will follow closely.  ?

## 2022-04-29 NOTE — Patient Instructions (Addendum)
Start a barrier cream containing zinc oxide ( such as Desitin) be applied to the anal area.I have sent in  ? ?1 percent hydrocortisone cream twice daily for one to two weeks to be used in conjunction with the barrier cream. Topical hydrocortisone should not be used for more than two weeks to avoid skin atrophy. ? ?We have scheduled you to see Dr Tasia Catchings tomorrow 04/29/22 at 2:30pm.  ? ? ? ?

## 2022-04-29 NOTE — Assessment & Plan Note (Signed)
Fatigue appears improved today with sleeping better while on trazodone 50 mg.  He also takes B12 which he feels has been helpful.  We will continue B12 monthly injections for now ?

## 2022-04-30 ENCOUNTER — Encounter: Payer: Self-pay | Admitting: Oncology

## 2022-04-30 ENCOUNTER — Inpatient Hospital Stay: Payer: Medicare HMO | Attending: Oncology | Admitting: Oncology

## 2022-04-30 ENCOUNTER — Inpatient Hospital Stay: Payer: Medicare HMO

## 2022-04-30 VITALS — BP 122/79 | HR 56 | Temp 96.4°F | Ht 68.0 in | Wt 125.0 lb

## 2022-04-30 DIAGNOSIS — C9110 Chronic lymphocytic leukemia of B-cell type not having achieved remission: Secondary | ICD-10-CM | POA: Diagnosis not present

## 2022-04-30 DIAGNOSIS — D589 Hereditary hemolytic anemia, unspecified: Secondary | ICD-10-CM | POA: Diagnosis not present

## 2022-04-30 DIAGNOSIS — Z87891 Personal history of nicotine dependence: Secondary | ICD-10-CM | POA: Insufficient documentation

## 2022-04-30 DIAGNOSIS — N1330 Unspecified hydronephrosis: Secondary | ICD-10-CM | POA: Insufficient documentation

## 2022-04-30 DIAGNOSIS — D509 Iron deficiency anemia, unspecified: Secondary | ICD-10-CM | POA: Insufficient documentation

## 2022-04-30 DIAGNOSIS — D649 Anemia, unspecified: Secondary | ICD-10-CM

## 2022-04-30 DIAGNOSIS — D561 Beta thalassemia: Secondary | ICD-10-CM | POA: Insufficient documentation

## 2022-04-30 DIAGNOSIS — R5383 Other fatigue: Secondary | ICD-10-CM | POA: Insufficient documentation

## 2022-04-30 LAB — CBC WITH DIFFERENTIAL/PLATELET
Abs Immature Granulocytes: 0.12 10*3/uL — ABNORMAL HIGH (ref 0.00–0.07)
Basophils Absolute: 0 10*3/uL (ref 0.0–0.1)
Basophils Relative: 0 %
Eosinophils Absolute: 0.2 10*3/uL (ref 0.0–0.5)
Eosinophils Relative: 0 %
HCT: 32.3 % — ABNORMAL LOW (ref 39.0–52.0)
Hemoglobin: 9.9 g/dL — ABNORMAL LOW (ref 13.0–17.0)
Immature Granulocytes: 0 %
Lymphocytes Relative: 81 %
Lymphs Abs: 62.6 10*3/uL — ABNORMAL HIGH (ref 0.7–4.0)
MCH: 26.2 pg (ref 26.0–34.0)
MCHC: 30.7 g/dL (ref 30.0–36.0)
MCV: 85.4 fL (ref 80.0–100.0)
Monocytes Absolute: 12.6 10*3/uL — ABNORMAL HIGH (ref 0.1–1.0)
Monocytes Relative: 16 %
Neutro Abs: 2.2 10*3/uL (ref 1.7–7.7)
Neutrophils Relative %: 3 %
Platelets: 178 10*3/uL (ref 150–400)
RBC: 3.78 MIL/uL — ABNORMAL LOW (ref 4.22–5.81)
RDW: 17.4 % — ABNORMAL HIGH (ref 11.5–15.5)
Smear Review: NORMAL
WBC: 77.7 10*3/uL (ref 4.0–10.5)
nRBC: 0 % (ref 0.0–0.2)

## 2022-04-30 LAB — COMPREHENSIVE METABOLIC PANEL
ALT: 10 U/L (ref 0–44)
AST: 22 U/L (ref 15–41)
Albumin: 4.5 g/dL (ref 3.5–5.0)
Alkaline Phosphatase: 76 U/L (ref 38–126)
Anion gap: 5 (ref 5–15)
BUN: 16 mg/dL (ref 8–23)
CO2: 26 mmol/L (ref 22–32)
Calcium: 9.4 mg/dL (ref 8.9–10.3)
Chloride: 104 mmol/L (ref 98–111)
Creatinine, Ser: 1.38 mg/dL — ABNORMAL HIGH (ref 0.61–1.24)
GFR, Estimated: 56 mL/min — ABNORMAL LOW (ref >60)
Glucose, Bld: 102 mg/dL — ABNORMAL HIGH (ref 70–99)
Potassium: 4.6 mmol/L (ref 3.5–5.1)
Sodium: 135 mmol/L (ref 135–145)
Total Bilirubin: 0.7 mg/dL (ref 0.3–1.2)
Total Protein: 7.3 g/dL (ref 6.5–8.1)

## 2022-04-30 LAB — VITAMIN B12: Vitamin B-12: 4548 pg/mL — ABNORMAL HIGH (ref 180–914)

## 2022-04-30 LAB — LACTATE DEHYDROGENASE: LDH: 170 U/L (ref 98–192)

## 2022-04-30 NOTE — Progress Notes (Signed)
[place] ? ?Clinic day:  04/30/22   ? ?Chief Complaint: [name] is a 71 y.o. 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 ? ?HPI:  The patient was last seen in the medical onocology clinic on 12/25/2016. Patient reports feeling profound fatigue, lack of appetite, weight loss. [name] weight 128 pounds today, comparing to 137 pounds when [name] was here in January 2018.  [name] met with [name], registered dietitian on 01/04/2017. Patient used to follow up with Dr.[name].  ? ?Treatment history: ?[name] originally presented with hemolytic anemia in 01/2014.  [name] was treated with prednisone. With taper of prednisone, his hemolysis returned.I think it's only positives are ?Per Dr.[name]'s note, patient received 4 weekly cycles of Rituxan (10/02/2014- 10/23/2014).  ? ?07/23/2017 Decision was made to start Ibrutinib as patient is very symptomatic (fatigue and weight loss, lack of appetite). Patient did not show up at the chemo education class. [name] was never started on Ibrutinib and his constitutional symptoms and is symptoms improved.  We agreed on not to proceed with treatments as it was not clear whether his symptoms are secondary to CLL or not. ?Patient was recommended for watchful waiting. 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  ? ?After visit on 06/03/2018, [name] lost follow-up  ?seek second opinion at [facility] and was evaluated by heme-onc Dr.Ellis.  Patient was recommended for watchful waiting.  Patient was seen by [facility] heme-onc in October 2020  ? ?Patient reestablish care on 05/07/2020 and lost follow-up again -reestablish care on 02/19/2022 and no-show to a follow-up appointment. ? ? ? ?INTERVAL HISTORY ?71 y.o. male with history of CLL presents for follow-up. ?No-show to his last appointment.  [name] was suggested by primary care provider to reschedule appointment. ?+ Tenderness of left groin area. ?+ Fatigue.  Appetite  is good. ?+ Weight loss, 6 pounds since April 2023. ? ? ?Past Medical History:  ?Diagnosis Date  ? Anemia 2008  ? Arthritis   ? Cervicalgia   ? 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 Dr. Tasia Catchings  ? Hyperlipidemia   ? Low back pain   ? Peripheral vascular disease ([place])   ? ? ?Past Surgical History:  ?Procedure Laterality Date  ? COLONOSCOPY    ? COLONOSCOPY WITH PROPOFOL N/A 07/23/2015  ? Procedure: COLONOSCOPY WITH PROPOFOL;  Surgeon: [name], MD;  Location: [facility] ENDOSCOPY;  Service: Endoscopy;  Laterality: N/A;  ? COLONOSCOPY WITH PROPOFOL N/A 04/20/2022  ? Procedure: COLONOSCOPY WITH PROPOFOL;  Surgeon: Jonathon Bellows, MD;  Location: [facility] ENDOSCOPY;  Service: Gastroenterology;  Laterality: N/A;  ? ESOPHAGOGASTRODUODENOSCOPY (EGD) WITH PROPOFOL N/A 08/20/2015  ? Procedure: ESOPHAGOGASTRODUODENOSCOPY (EGD) WITH PROPOFOL;  Surgeon: [name], MD;  Location: [facility] ENDOSCOPY;  Service: Endoscopy;  Laterality: N/A;  ? LOWER EXTREMITY ANGIOGRAPHY Left 05/19/2018  ? Procedure: LOWER EXTREMITY ANGIOGRAPHY;  Surgeon: Algernon Huxley, MD;  Location: [place] CV LAB;  Service: Cardiovascular;  Laterality: Left;  ? LOWER EXTREMITY ANGIOGRAPHY Right 06/15/2018  ? Procedure: LOWER EXTREMITY ANGIOGRAPHY;  Surgeon: Algernon Huxley, MD;  Location: [place] CV LAB;  Service: Cardiovascular;  Laterality: Right;  ? lymp node removal Right 2011  ? arm   left arm 2015  ? LYMPH NODE BIOPSY  08/2014  ? ? ?Family History  ?Problem Relation Age of Onset  ? Heart attack Father   ? Kidney cancer Neg Hx   ?  lung cancer  ? Bladder Cancer Neg Hx   ? Prostate cancer Neg Hx   ? ? ?Social History:  reports that [name] has quit smoking. His smoking use included cigars. [name] has never used smokeless tobacco. [name] reports that [name] does not drink alcohol and does not use drugs.  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 [name] works 11-12 hours a day (11:30 PM - 7:30 AM at [employer]).  ?Allergies:  ?Allergies  ?Allergen  Reactions  ? Penicillins   ?  Other reaction(s): Rash, Hives ()  ? Latex Rash  ? ?Review of Systems:  ?Review of Systems  ?Constitutional:  Positive for malaise/fatigue and weight loss. Negative for chills and fever.  ?HENT:  Negative for ear discharge, ear pain, nosebleeds and sore throat.   ?Eyes:  Negative for photophobia, pain and redness.  ?Respiratory:  Negative for cough, hemoptysis, sputum production, shortness of breath and wheezing.   ?Cardiovascular:  Negative for chest pain, palpitations and leg swelling.  ?Gastrointestinal:  Negative for abdominal pain, blood in stool, constipation, diarrhea, heartburn, nausea and vomiting.  ?Genitourinary:  Negative for dysuria and frequency.  ?     Swelling of groin area.  ?Musculoskeletal:  Negative for myalgias and neck pain.  ?Skin:  Negative for rash.  ?Neurological:  Negative for dizziness, tingling, tremors, weakness and headaches.  ?Endo/Heme/Allergies:  Does not bruise/bleed easily.  ?Psychiatric/Behavioral:  Negative for depression, hallucinations and suicidal ideas.   ? ? ?Current Outpatient Medications on File Prior to Visit  ?Medication Sig Dispense Refill  ? aspirin EC 81 MG tablet Take 81 mg by mouth daily. Swallow whole. 90 tablet 3  ? gabapentin (NEURONTIN) 300 MG capsule Take by mouth.    ? hydrocortisone cream 1 % Apply 1 application. topically 2 (two) times daily. 30 g 0  ? pantoprazole (PROTONIX) 20 MG tablet Take 1 tablet (20 mg total) by mouth daily. Take 1 hour prior to breakfast 30 tablet 2  ? pravastatin (PRAVACHOL) 80 MG tablet Take 1 tablet (80 mg total) by mouth daily. 90 tablet 3  ? traZODone (DESYREL) 50 MG tablet Take 0.5-1 tablets (25-50 mg total) by mouth at bedtime as needed for sleep. 30 tablet 3  ? ?No current facility-administered medications on file prior to visit.  ? ? ?Physical Exam: ?Blood pressure 122/79, pulse (!) 56, temperature (!) 96.4 ?F (35.8 ?C), temperature source Tympanic, height _0  (1.727 m), weight 125 lb (56.7  kg), SpO2 100 %. ?Physical Exam ?Constitutional:   ?   General: [name] is not in acute distress. ?   Appearance: [name] is not diaphoretic.  ?HENT:  ?   Head: Normocephalic and atraumatic.  ?   Nose: Nose normal.  ?   Mouth/Throat:  ?   Pharynx: No oropharyngeal exudate.  ?Eyes:  ?   General: No scleral icterus. ?   Pupils: Pupils are equal, round, and reactive to light.  ?Cardiovascular:  ?   Rate and Rhythm: Normal rate and regular rhythm.  ?   Heart sounds: No murmur heard. ?Pulmonary:  ?   Effort: Pulmonary effort is normal. No respiratory distress.  ?   Breath sounds: No rales.  ?Chest:  ?   Chest wall: No tenderness.  ?Abdominal:  ?   General: There is no distension.  ?   Palpations: Abdomen is soft.  ?   Tenderness: There is no abdominal tenderness.  ?Genitourinary: ?   Comments: Palpable left inguinal lymph node, tender ?Musculoskeletal:     ?  General: Normal range of motion.  ?   Cervical back: Normal range of motion and neck supple.  ?Skin: ?   General: Skin is warm and dry.  ?   Findings: No erythema.  ?Neurological:  ?   Mental Status: [name] is alert and oriented to person, place, and time.  ?   Cranial Nerves: No cranial nerve deficit.  ?   Motor: No abnormal muscle tone.  ?   Coordination: Coordination normal.  ?Psychiatric:     ?   Mood and Affect: Affect normal.  ?CMA Amy present as chaperone during physical examination. ? ?LABORATORY RESULTS. ?CBC ?   ?Component Value Date/Time  ? WBC 77.7 (HH) 04/30/2022 1508  ? RBC 3.78 (L) 04/30/2022 1508  ? HGB 9.9 (L) 04/30/2022 1508  ? HGB 13.0 03/13/2015 0852  ? HCT 32.3 (L) 04/30/2022 1508  ? HCT 40.9 03/13/2015 0852  ? PLT 178 04/30/2022 1508  ? PLT 217 03/13/2015 0852  ? MCV 85.4 04/30/2022 1508  ? MCV 78 (L) 03/13/2015 0852  ? MCH 26.2 04/30/2022 1508  ? MCHC 30.7 04/30/2022 1508  ? RDW 17.4 (H) 04/30/2022 1508  ? RDW 16.0 (H) 03/13/2015 3291  ? LYMPHSABS 62.6 (H) 04/30/2022 1508  ? LYMPHSABS 3.9 (H) 03/13/2015 9166  ? MONOABS 12.6 (H) 04/30/2022 1508  ? MONOABS  0.4 03/13/2015 0852  ? EOSABS 0.2 04/30/2022 1508  ? EOSABS 0.4 03/13/2015 0852  ? BASOSABS 0.0 04/30/2022 1508  ? BASOSABS 0.1 03/13/2015 0852  ? ?CMP  ?   ?Component Value Date/Time  ? NA 135 05/01/19

## 2022-05-01 LAB — HAPTOGLOBIN: Haptoglobin: 123 mg/dL (ref 32–363)

## 2022-05-08 ENCOUNTER — Ambulatory Visit
Admission: RE | Admit: 2022-05-08 | Discharge: 2022-05-08 | Disposition: A | Discharge status: Home / Self Care | Payer: Medicare HMO | Source: Ambulatory Visit | Attending: Oncology | Admitting: Oncology

## 2022-05-08 ENCOUNTER — Other Ambulatory Visit: Payer: Self-pay

## 2022-05-08 DIAGNOSIS — C9110 Chronic lymphocytic leukemia of B-cell type not having achieved remission: Secondary | ICD-10-CM | POA: Diagnosis not present

## 2022-05-08 DIAGNOSIS — N1330 Unspecified hydronephrosis: Secondary | ICD-10-CM | POA: Diagnosis not present

## 2022-05-08 DIAGNOSIS — I700 Atherosclerosis of aorta: Secondary | ICD-10-CM | POA: Diagnosis not present

## 2022-05-08 DIAGNOSIS — I2510 Atherosclerotic heart disease of native coronary artery without angina pectoris: Secondary | ICD-10-CM | POA: Diagnosis not present

## 2022-05-08 DIAGNOSIS — J432 Centrilobular emphysema: Secondary | ICD-10-CM | POA: Diagnosis not present

## 2022-05-08 DIAGNOSIS — R634 Abnormal weight loss: Secondary | ICD-10-CM | POA: Diagnosis not present

## 2022-05-08 MED ORDER — IOHEXOL 300 MG/ML  SOLN
80.0000 mL | Freq: Once | INTRAMUSCULAR | Status: AC | PRN
  Administered 2022-05-08: 80 mL via INTRAVENOUS

## 2022-05-11 ENCOUNTER — Telehealth: Payer: Self-pay

## 2022-05-11 ENCOUNTER — Telehealth: Payer: Self-pay | Admitting: Oncology

## 2022-05-11 ENCOUNTER — Inpatient Hospital Stay: Payer: Medicare HMO | Admitting: Oncology

## 2022-05-11 NOTE — Telephone Encounter (Signed)
Attempt made to reach patient to reschedule his missed appt today. No answer and not able to leave a VM.

## 2022-05-11 NOTE — Telephone Encounter (Signed)
Pt no showed to todays appt. Please r/s asap for CT results and inform pt of appt.

## 2022-05-12 NOTE — Telephone Encounter (Signed)
Pt scheduled and will come in Friday.

## 2022-05-15 ENCOUNTER — Inpatient Hospital Stay (HOSPITAL_BASED_OUTPATIENT_CLINIC_OR_DEPARTMENT_OTHER): Payer: Medicare HMO | Admitting: Oncology

## 2022-05-15 ENCOUNTER — Encounter: Payer: Self-pay | Admitting: Oncology

## 2022-05-15 ENCOUNTER — Telehealth: Payer: Self-pay

## 2022-05-15 VITALS — BP 138/77 | HR 50 | Temp 97.4°F | Resp 18 | Wt 130.0 lb

## 2022-05-15 DIAGNOSIS — Z7189 Other specified counseling: Secondary | ICD-10-CM | POA: Diagnosis not present

## 2022-05-15 DIAGNOSIS — C9110 Chronic lymphocytic leukemia of B-cell type not having achieved remission: Secondary | ICD-10-CM

## 2022-05-15 DIAGNOSIS — N1339 Other hydronephrosis: Secondary | ICD-10-CM

## 2022-05-15 DIAGNOSIS — D649 Anemia, unspecified: Secondary | ICD-10-CM | POA: Diagnosis not present

## 2022-05-15 DIAGNOSIS — N1330 Unspecified hydronephrosis: Secondary | ICD-10-CM | POA: Diagnosis not present

## 2022-05-15 DIAGNOSIS — D509 Iron deficiency anemia, unspecified: Secondary | ICD-10-CM | POA: Diagnosis not present

## 2022-05-15 DIAGNOSIS — D561 Beta thalassemia: Secondary | ICD-10-CM | POA: Diagnosis not present

## 2022-05-15 DIAGNOSIS — D589 Hereditary hemolytic anemia, unspecified: Secondary | ICD-10-CM | POA: Diagnosis not present

## 2022-05-15 DIAGNOSIS — R5383 Other fatigue: Secondary | ICD-10-CM | POA: Diagnosis not present

## 2022-05-15 DIAGNOSIS — Z87891 Personal history of nicotine dependence: Secondary | ICD-10-CM | POA: Diagnosis not present

## 2022-05-15 NOTE — Telephone Encounter (Signed)
Per LOS on 5/26:  CT guided biopsy- abdomen tissue mass - prefer biopsy appt to be a few days after PET.   PET scheduled for 05/28/22.   Request for CT biopsy faxed to IR scheduling. Fax confirmation received. Will call pt once CT is scheduled.

## 2022-05-15 NOTE — Progress Notes (Unsigned)
Pt here for follow up and CT scan results

## 2022-05-16 NOTE — Progress Notes (Signed)
[name]:  05/16/22    Chief Complaint: [name] is a 71 y.o. 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  HPI:  The patient was last seen in the medical onocology clinic on 12/25/2016. Patient reports feeling profound fatigue, lack of appetite, weight loss. He weight 128 pounds today, comparing to 137 pounds when he was here in January 2018.  He met with [name], registered dietitian on 01/04/2017. Patient used to follow up with Dr.[name].   Treatment history: He originally presented with hemolytic anemia in 01/2014.  He was treated with prednisone. With taper of prednisone, his hemolysis returned.I think it's only positives are Per Dr.[name]'s note, patient received 4 weekly cycles of Rituxan (10/02/2014- 10/23/2014).   07/23/2017 Decision was made to start Ibrutinib as patient is very symptomatic (fatigue and weight loss, lack of appetite). Patient did not show up at the chemo education class. He was never started on Ibrutinib and his constitutional symptoms and is symptoms improved.  We agreed on not to proceed with treatments as it was not clear whether his symptoms are secondary to CLL or not. Patient was recommended for watchful waiting. 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   After visit on 06/03/2018, he lost follow-up  seek second opinion at [facility] and was evaluated by heme-onc Dr.Ellis.  Patient was recommended for watchful waiting.  Patient was seen by [facility] heme-onc in October 2020   Patient reestablish care on 05/07/2020 and lost follow-up again -reestablish care on 02/19/2022 and no-show to a follow-up appointment.  He has slowly progressive microcytic anemia.  Colonoscopy on 07/23/2015 revealed diverticulosis in the sigmoid colon.  EGD on 08/20/2015 was normal. Guaiac cards x 3 were negative in 06/2015.  His diet fluctuates with his appetite.  He  denies any melana or hematochezia.  INTERVAL HISTORY 71 y.o. male with history of CLL presents for follow-up. Patient had a CT scan done during interval.  No showed to his appointment again to review results.  Patient was called and he rescheduled this appointment. + Tenderness of left groin area. + Fatigue.  Appetite is good. + Weight loss, 6 pounds since April 2023. He was accompanied by his wife.    Past Medical History:  Diagnosis Date   Anemia 2008   Arthritis    Cervicalgia    Chronic lymphocytic leukemia (CLL), B-cell ([place]) 06/15/2015   Dr. Tasia Catchings   Hyperlipidemia    Low back pain    Peripheral vascular disease [facility])     Past Surgical History:  Procedure Laterality Date   COLONOSCOPY     COLONOSCOPY WITH PROPOFOL N/A 07/23/2015   Procedure: COLONOSCOPY WITH PROPOFOL;  Surgeon: [name], MD;  Location: [facility] ENDOSCOPY;  Service: Endoscopy;  Laterality: N/A;   COLONOSCOPY WITH PROPOFOL N/A 04/20/2022   Procedure: COLONOSCOPY WITH PROPOFOL;  Surgeon: Jonathon Bellows, MD;  Location[facility] ENDOSCOPY;  Service: Gastroenterology;  Laterality: N/A;   ESOPHAGOGASTRODUODENOSCOPY (EGD) WITH PROPOFOL N/A 08/20/2015   Procedure: ESOPHAGOGASTRODUODENOSCOPY (EGD) WITH PROPOFOL;  Surgeon: [name], MD;  Location: [facility] ENDOSCOPY;  Service: Endoscopy;  Laterality: N/A;   LOWER EXTREMITY ANGIOGRAPHY Left 05/19/2018   Procedure: LOWER EXTREMITY ANGIOGRAPHY;  Surgeon: Algernon Huxley, MD;  Location: [place] CV LAB;  Service: Cardiovascular;  Laterality: Left;   LOWER EXTREMITY ANGIOGRAPHY Right 06/15/2018   Procedure: LOWER EXTREMITY ANGIOGRAPHY;  Surgeon: Algernon Huxley, MD;  Location: [facility]  INVASIVE CV LAB;  Service: Cardiovascular;  Laterality: Right;   lymp node removal Right 2011   arm   left arm 2015   LYMPH NODE BIOPSY  08/2014    Family History  Problem Relation Age of Onset   Heart attack Father    Kidney cancer Neg Hx        lung cancer   Bladder Cancer Neg Hx    Prostate  cancer Neg Hx     Social History:  reports that he has quit smoking. His smoking use included cigars. He has never used smokeless tobacco. He reports that he does not drink alcohol and does not use drugs.  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  Allergies:  Allergies  Allergen Reactions   Penicillins     Other reaction(s): Rash, Hives ()   Latex Rash   Review of Systems:  Review of Systems  Constitutional:  Positive for malaise/fatigue and weight loss. Negative for chills and fever.  HENT:  Negative for ear discharge, ear pain, nosebleeds and sore throat.   Eyes:  Negative for photophobia, pain and redness.  Respiratory:  Negative for cough, hemoptysis, sputum production, shortness of breath and wheezing.   Cardiovascular:  Negative for chest pain, palpitations and leg swelling.  Gastrointestinal:  Negative for abdominal pain, blood in stool, constipation, diarrhea, heartburn, nausea and vomiting.  Genitourinary:  Negative for dysuria and frequency.       Swelling of groin area.  Musculoskeletal:  Negative for myalgias and neck pain.  Skin:  Negative for rash.  Neurological:  Negative for dizziness, tingling, tremors, weakness and headaches.  Endo/Heme/Allergies:  Does not bruise/bleed easily.  Psychiatric/Behavioral:  Negative for depression, hallucinations and suicidal ideas.     Current Outpatient Medications on File Prior to Visit  Medication Sig Dispense Refill   aspirin EC 81 MG tablet Take 81 mg by mouth daily. Swallow whole. 90 tablet 3   gabapentin (NEURONTIN) 300 MG capsule Take by mouth.     hydrocortisone cream 1 % Apply 1 application. topically 2 (two) times daily. 30 g 0   pantoprazole (PROTONIX) 20 MG tablet Take 1 tablet (20 mg total) by mouth daily. Take 1 hour prior to breakfast 30 tablet 2   pravastatin (PRAVACHOL) 80 MG tablet Take 1 tablet (80 mg total) by mouth daily. 90 tablet 3    traZODone (DESYREL) 50 MG tablet Take 0.5-1 tablets (25-50 mg total) by mouth at bedtime as needed for sleep. 30 tablet 3   No current facility-administered medications on file prior to visit.    Physical Exam: Blood pressure 138/77, pulse (!) 50, temperature (!) 97.4 F (36.3 C), resp. rate 18, weight 130 lb (59 kg). Physical Exam Constitutional:      General: He is not in acute distress.    Appearance: He is not diaphoretic.  HENT:     Head: Normocephalic and atraumatic.     Nose: Nose normal.     Mouth/Throat:     Pharynx: No oropharyngeal exudate.  Eyes:     General: No scleral icterus.    Pupils: Pupils are equal, round, and reactive to light.  Cardiovascular:     Rate and Rhythm: Normal rate and regular rhythm.     Heart sounds: No murmur heard. Pulmonary:     Effort: Pulmonary effort is normal. No respiratory distress.     Breath sounds: No rales.  Chest:  Chest wall: No tenderness.  Abdominal:     General: There is no distension.     Palpations: Abdomen is soft. There is mass.     Tenderness: There is no abdominal tenderness.  Genitourinary:    Comments: Previously palpable left inguinal lymph node, tender Not checked during this encounter. Musculoskeletal:        General: Normal range of motion.     Cervical back: Normal range of motion and neck supple.  Skin:    General: Skin is warm and dry.     Findings: No erythema.  Neurological:     Mental Status: He is alert and oriented to person, place, and time.     Cranial Nerves: No cranial nerve deficit.     Motor: No abnormal muscle tone.     Coordination: Coordination normal.  Psychiatric:        Mood and Affect: Affect normal.    LABORATORY RESULTS. CBC    Component Value Date/Time   WBC 77.7 (HH) 04/30/2022 1508   RBC 3.78 (L) 04/30/2022 1508   HGB 9.9 (L) 04/30/2022 1508   HGB 13.0 03/13/2015 0852   HCT 32.3 (L) 04/30/2022 1508   HCT 40.9 03/13/2015 0852   PLT 178 04/30/2022 1508   PLT 217  03/13/2015 0852   MCV 85.4 04/30/2022 1508   MCV 78 (L) 03/13/2015 0852   MCH 26.2 04/30/2022 1508   MCHC 30.7 04/30/2022 1508   RDW 17.4 (H) 04/30/2022 1508   RDW 16.0 (H) 03/13/2015 0852   LYMPHSABS 62.6 (H) 04/30/2022 1508   LYMPHSABS 3.9 (H) 03/13/2015 0852   MONOABS 12.6 (H) 04/30/2022 1508   MONOABS 0.4 03/13/2015 0852   EOSABS 0.2 04/30/2022 1508   EOSABS 0.4 03/13/2015 0852   BASOSABS 0.0 04/30/2022 1508   BASOSABS 0.1 03/13/2015 0852   CMP     Component Value Date/Time   NA 135 04/30/2022 1508   NA 138 10/16/2019 0000   NA 136 03/13/2015 0852   K 4.6 04/30/2022 1508   K 3.9 03/13/2015 0852   CL 104 04/30/2022 1508   CL 104 03/13/2015 0852   CO2 26 04/30/2022 1508   CO2 26 03/13/2015 0852   GLUCOSE 102 (H) 04/30/2022 1508   GLUCOSE 96 03/13/2015 0852   BUN 16 04/30/2022 1508   BUN 14 10/16/2019 0000   BUN 11 03/13/2015 0852   CREATININE 1.38 (H) 04/30/2022 1508   CREATININE 1.17 02/03/2019 1612   CALCIUM 9.4 04/30/2022 1508   CALCIUM 9.3 03/13/2015 0852   PROT 7.3 04/30/2022 1508   PROT 8.1 03/13/2015 0852   ALBUMIN 4.5 04/30/2022 1508   ALBUMIN 4.5 03/13/2015 0852   AST 22 04/30/2022 1508   AST 26 03/13/2015 0852   ALT 10 04/30/2022 1508   ALT 12 (L) 03/13/2015 0852   ALKPHOS 76 04/30/2022 1508   ALKPHOS 57 03/13/2015 0852   BILITOT 0.7 04/30/2022 1508   BILITOT 1.2 03/13/2015 0852   GFRNONAA 56 (L) 04/30/2022 1508   GFRNONAA >60 03/13/2015 0852   GFRAA >60 04/25/2020 1123   GFRAA >60 03/13/2015 0852   PATHOLOGY:  11/02/2016 Peripheral blood FISH studies revealed 20% of nuclei positive for loss of 1 ATM signal, 63% of nuclei positive for trisomy 12, and 32% of nuclei positive for loss of 1 TP53 signal. Results for CCND1/IGH and 13q were normal.  09/13/2014 Left axillary node biopsy on  confirmed B-cell small lymphocytic lymphoma (B-CLL/SLL)   IMAGE STUDIES CT CHEST ABDOMEN PELVIS W CONTRAST  Result  Date: 05/08/2022 CLINICAL DATA:  CLL, fatigue  and weight loss, left groin lymphadenopathy palpated * Tracking Code: BO * EXAM: CT CHEST, ABDOMEN, AND PELVIS WITH CONTRAST TECHNIQUE: Multidetector CT imaging of the chest, abdomen and pelvis was performed following the standard protocol during bolus administration of intravenous contrast. RADIATION DOSE REDUCTION: This exam was performed according to the departmental dose-optimization program which includes automated exposure control, adjustment of the mA and/or kV according to patient size and/or use of iterative reconstruction technique. CONTRAST:  61m OMNIPAQUE IOHEXOL 300 MG/ML SOLN, additional oral enteric contrast COMPARISON:  CT chest, 06/05/2021, PET-CT, 06/24/2016 FINDINGS: CT CHEST FINDINGS Cardiovascular: No significant vascular findings. Normal heart size. Three-vessel coronary artery calcifications. No pericardial effusion. Mediastinum/Nodes: When compared most recent prior CT examination of the chest dated 06/05/2021, unchanged enlarged bilateral axillary lymph nodes, right axillary nodes measuring up to 1.5 x 1.2 cm (series 2, image 16). Surgical clips in the right axilla. Unchanged prominent pretracheal nodes measuring up to 1.4 x 1.3 cm (series 2, image 26). Thyroid gland, trachea, and esophagus demonstrate no significant findings. Lungs/Pleura: Mild centrilobular and paraseptal emphysema. Stable, benign small subpleural pulmonary nodules, for example a 0.5 cm nodule of the lingula (series 3, image 104). No further follow-up or characterization is required. No pleural effusion or pneumothorax. Musculoskeletal: No chest wall mass or suspicious osseous lesions identified. CT ABDOMEN PELVIS FINDINGS Hepatobiliary: No solid liver abnormality is seen. No gallstones, gallbladder wall thickening, or biliary dilatation. Pancreas: Unremarkable. No pancreatic ductal dilatation or surrounding inflammatory changes. Spleen: Normal in size without significant abnormality. Adrenals/Urinary Tract: Adrenal  glands are unremarkable. Mild right hydronephrosis, the proximal ureter appears compressed or obstructed by overlying lymphadenopathy. No left-sided hydronephrosis. No calculi or renal mass. Bladder is unremarkable. Stomach/Bowel: Stomach is within normal limits. Appendix appears normal. No evidence of bowel wall thickening, distention, or inflammatory changes. Vascular/Lymphatic: Aortic atherosclerosis. When compared to most recent imaging of the abdomen and pelvis dated 06/24/2016, there are numerous, extremely bulky lymph nodes throughout the abdomen and pelvis, which are greatly increased in size. Due to bulky, confluent lymphadenopathy involving the upper abdominal, mesenteric, and retroperitoneal stations, these are difficult to discretely measure, however a large node conglomerate in the ventral abdomen measures at least 14.5 x 6.0 cm (series 2, image 75). Interval enlargement of left iliac/pelvic sidewall lymph nodes, measuring up to 3.4 x 2.9 cm, previously 2.5 x 1.8 cm (series 2, image 106). Newly enlarged left inguinal lymph nodes measuring up to 2.6 x 1.6 cm (series 2, image 111). Reproductive: No mass or other abnormality. Other: No abdominal wall hernia or abnormality. No ascites. Musculoskeletal: No acute osseous findings. IMPRESSION: 1. When compared to most recent prior CT examination of the chest dated 06/05/2021, unchanged enlarged bilateral axillary lymph nodes and prominent pretracheal mediastinal lymph nodes. 2. However, when compared to most recent prior CT examination of the abdomen and pelvis dated 06/24/2016, there are numerous, extremely bulky lymph nodes throughout the abdomen and pelvis, which are greatly increased in size. Newly enlarged left inguinal lymph nodes, in keeping with reported palpable lymphadenopathy. 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 3. Mild right hydronephrosis; the proximal ureter appears compressed or obstructed by overlying lymphadenopathy. 4. Emphysema.  5. Coronary artery disease. These results will be called to the ordering clinician or representative by the Radiologist Assistant, and communication documented in the PACS or [employer] Aortic Atherosclerosis (ICD10-I70.0) and Emphysema (ICD10-J43.9). Electronically Signed   By: ADelanna AhmadiM.D.   On: 05/08/2022 20:00  Assessment:  [name] is a 71 y.o. male with B-cell small lymphocytic lymphoma (B-cell CLL/SLL) initially presented with hemolytic anemia in 01/2014, s/p treatment with steroid and Rituximab, presents for follow-up.  1. CLL (chronic lymphocytic leukemia) ([place])   2. Goals of care, counseling/discussion   3. Normocytic anemia   4. Other hydronephrosis     Cancer Staging  Chronic lymphocytic leukemia (CLL), B-cell ([place]) Staging form: Chronic Lymphocytic Leukemia / Small Lymphocytic Lymphoma, AJCC 8th Edition - Clinical stage from 07/23/2017: Modified Rai Stage III (Modified Rai risk: High, Lymphocytosis: Present, Adenopathy: Present, Organomegaly: Absent, Anemia: Present, Thrombocytopenia: Absent) - Signed by Earlie Server, MD on 02/19/2022   #Recurrent CLL previously treated with rituximab. 05/08/2022, CT scan showed bulky lymph node throughout the abdomen and pelvis.  He also has bilateral axillary lymphadenopathy, pretracheal lymphadenopathy and mediastinal lymphadenopathy.  Might right hydronephrosis due to overlying lymphadenopathy.  We discussed about PET scan restaging, followed by biopsy to rule out transformation to high-grade lymphoma. Assuming biopsy confirms recurrent CLL, I recommend acalabrutinib 100 mg twice daily. Patient and potential side effects were reviewed and discussed with patient We will check insurance coverage. Importance of medication and follow-up compliance were discussed with patient and his wife.  They are motivated.  #Tobacco abuse, smoke cessation discussed with patient.   #Hydronephrosis secondary to lymphadenopathy compression.   Creatinine slightly increased.  CLL treatment is indicated. #Normocytic anemia, hemoglobin has decreased to 9.9, likely due to CLL bone marrow involvement.  Orders Placed This Encounter  Procedures   NM PET Image Restage (PS) Skull Base to Thigh (F-18 FDG)    Standing Status:   Future    Standing Expiration Date:   05/16/2023    Order Specific Question:   If indicated for the ordered procedure, I authorize the administration of a radiopharmaceutical per Radiology protocol    Answer:   Yes    Order Specific Question:   Preferred imaging location?    Answer:   [facility]    Order Specific Question:   Radiology Contrast Protocol - do NOT remove file path    Answer:   [email or web address]   CT BIOPSY    Standing Status:   Future    Standing Expiration Date:   05/15/2023    Order Specific Question:   Reason for exam:    Answer:   abdomen tissue mass    Order Specific Question:   Preferred imaging location?    Answer:   [facility]   We spent sufficient time to discuss many aspect of care, questions were answered to patient's satisfaction.  Earlie Server, MD, PhD Hematology Oncology 05/16/2022

## 2022-05-16 NOTE — Progress Notes (Signed)
DISCONTINUE ON PATHWAY REGIMEN - Lymphoma and CLL     A cycle is every 28 days:     Ibrutinib   **Always confirm dose/schedule in your pharmacy ordering system**  REASON: Disease Progression PRIOR TREATMENT: [id number]: Ibrutinib 420 mg Daily Until Progression or Unacceptable Toxicity TREATMENT RESPONSE: Progressive Disease (PD)  START ON PATHWAY REGIMEN - Lymphoma and CLL     A cycle is every 28 days:     Acalabrutinib   **Always confirm dose/schedule in your pharmacy ordering system**  Patient Characteristics: Chronic Lymphocytic Leukemia (CLL), Treatment Indicated, First Line Disease Type: Chronic Lymphocytic Leukemia (CLL) Disease Type: Not Applicable Disease Type: Not Applicable Treatment Indicated<= Treatment Indicated Line of Therapy: First Line Intent of Therapy: Non-Curative / Palliative Intent, Discussed with Patient

## 2022-05-25 ENCOUNTER — Other Ambulatory Visit: Payer: Medicare HMO

## 2022-05-25 ENCOUNTER — Ambulatory Visit: Payer: Medicare HMO | Admitting: Oncology

## 2022-05-25 ENCOUNTER — Ambulatory Visit: Payer: Medicare HMO

## 2022-05-27 ENCOUNTER — Ambulatory Visit: Payer: Medicare HMO

## 2022-05-28 ENCOUNTER — Encounter
Admission: RE | Admit: 2022-05-28 | Discharge: 2022-05-28 | Disposition: A | Discharge status: Home / Self Care | Payer: Medicare HMO | Source: Ambulatory Visit | Attending: Oncology | Admitting: Oncology

## 2022-05-28 DIAGNOSIS — I2510 Atherosclerotic heart disease of native coronary artery without angina pectoris: Secondary | ICD-10-CM | POA: Diagnosis not present

## 2022-05-28 DIAGNOSIS — J432 Centrilobular emphysema: Secondary | ICD-10-CM | POA: Diagnosis not present

## 2022-05-28 DIAGNOSIS — C9110 Chronic lymphocytic leukemia of B-cell type not having achieved remission: Secondary | ICD-10-CM | POA: Diagnosis not present

## 2022-05-28 DIAGNOSIS — I700 Atherosclerosis of aorta: Secondary | ICD-10-CM | POA: Diagnosis not present

## 2022-05-28 MED ORDER — FLUDEOXYGLUCOSE F - 18 (FDG) INJECTION
7.2700 | Freq: Once | INTRAVENOUS | Status: AC | PRN
  Administered 2022-05-28: 7.27 via INTRAVENOUS

## 2022-05-29 LAB — GLUCOSE, CAPILLARY: Glucose-Capillary: 92 mg/dL (ref 70–99)

## 2022-05-31 ENCOUNTER — Encounter: Payer: Self-pay | Admitting: Oncology

## 2022-05-31 DIAGNOSIS — Z0000 Encounter for general adult medical examination without abnormal findings: Secondary | ICD-10-CM | POA: Insufficient documentation

## 2022-05-31 DIAGNOSIS — Z5111 Encounter for antineoplastic chemotherapy: Secondary | ICD-10-CM | POA: Insufficient documentation

## 2022-06-01 ENCOUNTER — Other Ambulatory Visit: Payer: Self-pay

## 2022-06-01 DIAGNOSIS — C9110 Chronic lymphocytic leukemia of B-cell type not having achieved remission: Secondary | ICD-10-CM

## 2022-06-01 NOTE — Telephone Encounter (Signed)
Biopsy scheduled for Mon 6/19 at 10a and to arrive at [place].  The IR Nurse will call and go over his instructions a few days prior to his procedure.    Attempt to call pt has been made x2. Unable to leave VM. Will call him back again.

## 2022-06-01 NOTE — Progress Notes (Signed)
Patient on schedule for LN biopsy 6/19, called and spoke with patient on phone with pre procedure instructions given. Is holding Aspirin 81 mg after LD 6/14, not being prescribed by MD, to be here at 0900, NPO after MN prior to procedure and driver post procedure/recovery/discharge. Stated understanding.

## 2022-06-03 NOTE — Telephone Encounter (Signed)
Pt informed of appt details. He states that IR nurse has contacted him and went over procedure instructions with him.

## 2022-06-05 ENCOUNTER — Other Ambulatory Visit: Payer: Self-pay | Admitting: Internal Medicine

## 2022-06-05 DIAGNOSIS — C9110 Chronic lymphocytic leukemia of B-cell type not having achieved remission: Secondary | ICD-10-CM

## 2022-06-08 ENCOUNTER — Ambulatory Visit
Admission: RE | Admit: 2022-06-08 | Discharge: 2022-06-08 | Disposition: A | Discharge status: Home / Self Care | Payer: Medicare HMO | Source: Ambulatory Visit | Attending: Oncology | Admitting: Oncology

## 2022-06-08 DIAGNOSIS — C9110 Chronic lymphocytic leukemia of B-cell type not having achieved remission: Secondary | ICD-10-CM | POA: Insufficient documentation

## 2022-06-08 DIAGNOSIS — R590 Localized enlarged lymph nodes: Secondary | ICD-10-CM | POA: Diagnosis not present

## 2022-06-08 NOTE — Procedures (Signed)
Vascular and Interventional Radiology Procedure Note  Patient: [name] DOB: [birth year] Medical Record Number: [id number] Note Date/Time: 06/08/22 9:30 AM   Performing Physician: Michaelle Birks, MD Assistant(s): None  Diagnosis: CLL  Procedure: LEFT INGUINAL LYMPH NODE BIOPSY  Anesthesia: Local Anesthetic Complications: None Estimated Blood Loss: Minimal Specimens: Sent for Pathology  Findings:  Successful Ultrasound-guided biopsy of LEFT inguinal LN Bx. A total of 3 samples were obtained. Hemostasis of the tract was achieved using Manual Pressure.  Plan: Bed rest for 1 hours.  See detailed procedure note with images in PACS. The patient tolerated the procedure well without incident or complication and was returned to Recovery in stable condition.    Michaelle Birks, MD Vascular and Interventional Radiology Specialists [facility] Radiology   Pager. [place]

## 2022-06-12 ENCOUNTER — Encounter: Payer: Self-pay | Admitting: Oncology

## 2022-06-12 LAB — SURGICAL PATHOLOGY

## 2022-06-14 ENCOUNTER — Other Ambulatory Visit: Payer: Self-pay | Admitting: Oncology

## 2022-06-14 MED ORDER — ACALABRUTINIB 100 MG PO CAPS: 100.0000 mg | ORAL_CAPSULE | Freq: Two times a day (BID) | ORAL | 0 refills | Status: DC

## 2022-06-15 ENCOUNTER — Other Ambulatory Visit (HOSPITAL_COMMUNITY): Payer: Self-pay

## 2022-06-15 ENCOUNTER — Encounter: Payer: Self-pay | Admitting: Oncology

## 2022-06-15 ENCOUNTER — Telehealth: Payer: Self-pay | Admitting: Pharmacy Technician

## 2022-06-15 ENCOUNTER — Other Ambulatory Visit: Payer: Self-pay

## 2022-06-15 ENCOUNTER — Observation Stay
Admission: EM | Admit: 2022-06-15 | Discharge: 2022-06-16 | Disposition: A | Discharge status: Home / Self Care | Payer: Medicare HMO | Attending: Internal Medicine | Admitting: Internal Medicine

## 2022-06-15 ENCOUNTER — Encounter: Payer: Self-pay | Admitting: Emergency Medicine

## 2022-06-15 ENCOUNTER — Telehealth: Payer: Self-pay | Admitting: Pharmacist

## 2022-06-15 ENCOUNTER — Emergency Department: Payer: Medicare HMO

## 2022-06-15 DIAGNOSIS — R42 Dizziness and giddiness: Secondary | ICD-10-CM | POA: Diagnosis not present

## 2022-06-15 DIAGNOSIS — Z8679 Personal history of other diseases of the circulatory system: Secondary | ICD-10-CM | POA: Insufficient documentation

## 2022-06-15 DIAGNOSIS — N182 Chronic kidney disease, stage 2 (mild): Secondary | ICD-10-CM | POA: Insufficient documentation

## 2022-06-15 DIAGNOSIS — Z79899 Other long term (current) drug therapy: Secondary | ICD-10-CM | POA: Diagnosis not present

## 2022-06-15 DIAGNOSIS — E440 Moderate protein-calorie malnutrition: Secondary | ICD-10-CM | POA: Insufficient documentation

## 2022-06-15 DIAGNOSIS — Z7982 Long term (current) use of aspirin: Secondary | ICD-10-CM | POA: Diagnosis not present

## 2022-06-15 DIAGNOSIS — C9110 Chronic lymphocytic leukemia of B-cell type not having achieved remission: Secondary | ICD-10-CM

## 2022-06-15 DIAGNOSIS — Z87891 Personal history of nicotine dependence: Secondary | ICD-10-CM | POA: Insufficient documentation

## 2022-06-15 DIAGNOSIS — R110 Nausea: Secondary | ICD-10-CM | POA: Diagnosis not present

## 2022-06-15 DIAGNOSIS — R55 Syncope and collapse: Principal | ICD-10-CM

## 2022-06-15 DIAGNOSIS — I959 Hypotension, unspecified: Secondary | ICD-10-CM | POA: Diagnosis not present

## 2022-06-15 DIAGNOSIS — Z91040 Latex allergy status: Secondary | ICD-10-CM | POA: Diagnosis not present

## 2022-06-15 DIAGNOSIS — R001 Bradycardia, unspecified: Secondary | ICD-10-CM

## 2022-06-15 DIAGNOSIS — Z0389 Encounter for observation for other suspected diseases and conditions ruled out: Secondary | ICD-10-CM | POA: Diagnosis not present

## 2022-06-15 DIAGNOSIS — D649 Anemia, unspecified: Secondary | ICD-10-CM | POA: Diagnosis not present

## 2022-06-15 LAB — URINALYSIS, ROUTINE W REFLEX MICROSCOPIC
Bilirubin Urine: NEGATIVE
Glucose, UA: NEGATIVE mg/dL
Hgb urine dipstick: NEGATIVE
Ketones, ur: NEGATIVE mg/dL
Leukocytes,Ua: NEGATIVE
Nitrite: NEGATIVE
Protein, ur: NEGATIVE mg/dL
Specific Gravity, Urine: 1.015 (ref 1.005–1.030)
pH: 6 (ref 5.0–8.0)

## 2022-06-15 LAB — HEPATIC FUNCTION PANEL
ALT: 12 U/L (ref 0–44)
AST: 23 U/L (ref 15–41)
Albumin: 3.6 g/dL (ref 3.5–5.0)
Alkaline Phosphatase: 71 U/L (ref 38–126)
Bilirubin, Direct: <0.1 mg/dL (ref 0.0–0.2)
Total Bilirubin: 0.7 mg/dL (ref 0.3–1.2)
Total Protein: 6.2 g/dL — ABNORMAL LOW (ref 6.5–8.1)

## 2022-06-15 LAB — TROPONIN I (HIGH SENSITIVITY)
Troponin I (High Sensitivity): 39 ng/L — ABNORMAL HIGH (ref <18)
Troponin I (High Sensitivity): 35 ng/L — ABNORMAL HIGH (ref <18)

## 2022-06-15 LAB — CBC WITH DIFFERENTIAL/PLATELET
Abs Immature Granulocytes: 0.12 10*3/uL — ABNORMAL HIGH (ref 0.00–0.07)
Basophils Absolute: 0.1 10*3/uL (ref 0.0–0.1)
Basophils Relative: 0 %
Eosinophils Absolute: 0.1 10*3/uL (ref 0.0–0.5)
Eosinophils Relative: 0 %
HCT: 28.4 % — ABNORMAL LOW (ref 39.0–52.0)
Hemoglobin: 8.3 g/dL — ABNORMAL LOW (ref 13.0–17.0)
Immature Granulocytes: 0 %
Lymphocytes Relative: 85 %
Lymphs Abs: 63.5 10*3/uL — ABNORMAL HIGH (ref 0.7–4.0)
MCH: 25.8 pg — ABNORMAL LOW (ref 26.0–34.0)
MCHC: 29.2 g/dL — ABNORMAL LOW (ref 30.0–36.0)
MCV: 88.2 fL (ref 80.0–100.0)
Monocytes Absolute: 9.9 10*3/uL — ABNORMAL HIGH (ref 0.1–1.0)
Monocytes Relative: 13 %
Neutro Abs: 1.5 10*3/uL — ABNORMAL LOW (ref 1.7–7.7)
Neutrophils Relative %: 2 %
Platelets: 141 10*3/uL — ABNORMAL LOW (ref 150–400)
RBC: 3.22 MIL/uL — ABNORMAL LOW (ref 4.22–5.81)
RDW: 18 % — ABNORMAL HIGH (ref 11.5–15.5)
Smear Review: NORMAL
WBC: 75.3 10*3/uL (ref 4.0–10.5)
nRBC: 0 % (ref 0.0–0.2)

## 2022-06-15 LAB — CBC
HCT: 27.9 % — ABNORMAL LOW (ref 39.0–52.0)
Hemoglobin: 8.3 g/dL — ABNORMAL LOW (ref 13.0–17.0)
MCH: 25.9 pg — ABNORMAL LOW (ref 26.0–34.0)
MCHC: 29.7 g/dL — ABNORMAL LOW (ref 30.0–36.0)
MCV: 87.2 fL (ref 80.0–100.0)
Platelets: 146 10*3/uL — ABNORMAL LOW (ref 150–400)
RBC: 3.2 MIL/uL — ABNORMAL LOW (ref 4.22–5.81)
RDW: 17.7 % — ABNORMAL HIGH (ref 11.5–15.5)
WBC: 75.2 10*3/uL (ref 4.0–10.5)
nRBC: 0 % (ref 0.0–0.2)

## 2022-06-15 LAB — BASIC METABOLIC PANEL
Anion gap: 4 — ABNORMAL LOW (ref 5–15)
BUN: 17 mg/dL (ref 8–23)
CO2: 24 mmol/L (ref 22–32)
Calcium: 8.9 mg/dL (ref 8.9–10.3)
Chloride: 110 mmol/L (ref 98–111)
Creatinine, Ser: 1.39 mg/dL — ABNORMAL HIGH (ref 0.61–1.24)
GFR, Estimated: 55 mL/min — ABNORMAL LOW (ref >60)
Glucose, Bld: 93 mg/dL (ref 70–99)
Potassium: 4.9 mmol/L (ref 3.5–5.1)
Sodium: 138 mmol/L (ref 135–145)

## 2022-06-15 LAB — URIC ACID: Uric Acid, Serum: 6.5 mg/dL (ref 3.7–8.6)

## 2022-06-15 LAB — PREPARE RBC (CROSSMATCH)

## 2022-06-15 LAB — HIV ANTIBODY (ROUTINE TESTING W REFLEX): HIV Screen 4th Generation wRfx: NONREACTIVE

## 2022-06-15 LAB — CORTISOL-PM, BLOOD: Cortisol - PM: 8.6 ug/dL (ref <10.0)

## 2022-06-15 LAB — TSH: TSH: 2.323 u[IU]/mL (ref 0.350–4.500)

## 2022-06-15 LAB — ABO/RH: ABO/RH(D): A NEG

## 2022-06-15 MED ORDER — ENSURE ENLIVE PO LIQD
237.0000 mL | Freq: Two times a day (BID) | ORAL | Status: DC
  Administered 2022-06-16 (×2): 237 mL via ORAL

## 2022-06-15 MED ORDER — PANTOPRAZOLE SODIUM 20 MG PO TBEC
20.0000 mg | DELAYED_RELEASE_TABLET | Freq: Every day | ORAL | Status: DC
  Administered 2022-06-16: 20 mg via ORAL
  Filled 2022-06-15: qty 1

## 2022-06-15 MED ORDER — LACTATED RINGERS IV BOLUS
1000.0000 mL | Freq: Once | INTRAVENOUS | Status: AC
  Administered 2022-06-15: 1000 mL via INTRAVENOUS

## 2022-06-15 MED ORDER — TRAZODONE HCL 50 MG PO TABS: 25.0000 mg | ORAL_TABLET | Freq: Every evening | ORAL | Status: DC | PRN

## 2022-06-15 MED ORDER — SODIUM CHLORIDE 0.9 % IV BOLUS
1000.0000 mL | Freq: Once | INTRAVENOUS | Status: AC
  Administered 2022-06-15: 1000 mL via INTRAVENOUS

## 2022-06-15 MED ORDER — ACALABRUTINIB MALEATE 100 MG PO TABS: 1.0000 | ORAL_TABLET | Freq: Two times a day (BID) | ORAL | Status: DC

## 2022-06-15 MED ORDER — CALQUENCE 100 MG PO TABS
1.0000 | ORAL_TABLET | Freq: Two times a day (BID) | ORAL | 0 refills | Status: DC
  Filled 2022-06-15: qty 60, fill #0
  Filled 2022-06-17: qty 60, 30d supply, fill #0

## 2022-06-15 MED ORDER — ASPIRIN 81 MG PO TBEC
81.0000 mg | DELAYED_RELEASE_TABLET | Freq: Every day | ORAL | Status: DC
  Administered 2022-06-16: 81 mg via ORAL
  Filled 2022-06-15: qty 1

## 2022-06-15 MED ORDER — SODIUM CHLORIDE 0.9% IV SOLUTION
Freq: Once | INTRAVENOUS | Status: DC
  Filled 2022-06-15: qty 250

## 2022-06-15 MED ORDER — PRAVASTATIN SODIUM 20 MG PO TABS: 80.0000 mg | ORAL_TABLET | Freq: Every evening | ORAL | Status: DC

## 2022-06-15 MED ORDER — GABAPENTIN 100 MG PO CAPS
200.0000 mg | ORAL_CAPSULE | Freq: Three times a day (TID) | ORAL | Status: DC
  Administered 2022-06-15 – 2022-06-16 (×2): 200 mg via ORAL
  Filled 2022-06-15 (×2): qty 2

## 2022-06-15 NOTE — ED Triage Notes (Signed)
Pt to Ed via [facility] from home for near syncopal episode this morning while cooking breakfast. Pt wife states that pt sat down and said he felt like he was going to pass and then started sweating and his eye rolled back in his head. Pt is currently [facility].

## 2022-06-15 NOTE — Telephone Encounter (Signed)
Oral Oncology Pharmacist Encounter  Received new prescription for Calquence (acalabrutinib) for the treatment of CLL (previously under observation), planned duration until disease progression or unacceptable drug toxicity.  CBC from 06/15/22 assessed, no relevant lab abnormalities. Prescription dose and frequency assessed.   Current medication list in Epic reviewed, no DDIs with acalabrutinib identified.  Evaluated chart and one patient barriers to medication adherence identified. Patient has been lose to follow-up in the past.   Prescription has been e-scribed to the [facility] for benefits analysis and approval.  Oral Oncology Clinic will continue to follow for insurance authorization, copayment issues, initial counseling and start date.   Darl Pikes, PharmD, BCPS, BCOP, CPP Hematology/Oncology Clinical Pharmacist Practitioner [place]/DB/AP [facility] [phone]  06/15/2022 12:14 PM

## 2022-06-15 NOTE — ED Notes (Signed)
Receiving RN Marta Antu has agreed to accept [facility] once pt has arrived to inpatient unit, all questions and concerns address.

## 2022-06-16 ENCOUNTER — Observation Stay (HOSPITAL_BASED_OUTPATIENT_CLINIC_OR_DEPARTMENT_OTHER)
Admit: 2022-06-16 | Discharge: 2022-06-16 | Disposition: A | Discharge status: Home / Self Care | Payer: Medicare HMO | Attending: Internal Medicine | Admitting: Internal Medicine

## 2022-06-16 ENCOUNTER — Telehealth: Payer: Self-pay

## 2022-06-16 DIAGNOSIS — N182 Chronic kidney disease, stage 2 (mild): Secondary | ICD-10-CM | POA: Diagnosis not present

## 2022-06-16 DIAGNOSIS — R001 Bradycardia, unspecified: Secondary | ICD-10-CM | POA: Diagnosis not present

## 2022-06-16 DIAGNOSIS — R55 Syncope and collapse: Secondary | ICD-10-CM

## 2022-06-16 DIAGNOSIS — C9111 Chronic lymphocytic leukemia of B-cell type in remission: Secondary | ICD-10-CM | POA: Diagnosis not present

## 2022-06-16 DIAGNOSIS — D649 Anemia, unspecified: Secondary | ICD-10-CM

## 2022-06-16 LAB — ECHOCARDIOGRAM COMPLETE
Area-P 1/2: 2.32 cm2
Calc EF: 62.6 %
Height: 68 in
P 1/2 time: 770 msec
S' Lateral: 3.23 cm
Single Plane A2C EF: 64.2 %
Single Plane A4C EF: 60.2 %
Weight: 2074.09 oz

## 2022-06-16 LAB — CREATININE, SERUM
Creatinine, Ser: 1.36 mg/dL — ABNORMAL HIGH (ref 0.61–1.24)
GFR, Estimated: 57 mL/min — ABNORMAL LOW

## 2022-06-16 LAB — POTASSIUM: Potassium: 4.9 mmol/L (ref 3.5–5.1)

## 2022-06-16 NOTE — Discharge Summary (Addendum)
[name] ZOX:[id number] DOB: 05/21/1951 DOA: 06/15/2022  PCP: Allegra Grana, FNP  Admit date: 06/15/2022 Discharge date: 06/16/2022  Admitted From: Home Disposition: Home  Recommendations for Outpatient Follow-up:  Follow up with PCP in 1 week Please obtain BMP/CBC in one week Please follow up cardiology  Home Health:    Discharge Condition:Stable CODE STATUS: Full Diet recommendation: Heart Healthy  Brief/Interim Summary: Per WJX:[name] is a 71 y.o. male with medical history significant of CLL on chemotherapy, CKD stage II, chronic normocytic anemia, PVD, chronic sinus bradycardia, chronic lower back pain, presented with syncope.   Patient was cooking breakfast standing and suddenly became lightheadedness and blurry vision feeling nauseous and then lost consciousness and fell down on the floor.  Recovered partially spontaneously in few seconds, feeling sweaty and cold.  No loss control of bone with urine no tongue biting.   Last month, patient developed increasing exertional dyspnea, walking 5 to 10 minutes cane due to significant shortness of breath.  No cough no chest pain.  No swelling.  Patient has CLL and been following with oncologist, and recent studies showed patient has worsening of leukocytosis, and oncology plans to initiate immune therapy this month.  Patient has chronic normocytic anemia, denies any abdominal pain, no black tarry stool no blood in the stool.   ED Course: Was found to be hypotensive, and bradycardic, blood pressure improved with 1 L IV bolus.  Chest x-ray negative for acute finding.  Echo was obtained with normal EF and no wall motion abnormality.  On telemetry he was sinus bradycardia.  Asymptomatic.  He does report was supposed to follow-up with cardiology but he did not since he was middle of COVID.  He is stable for discharge.  Vasovagal syncope -Probably mild volume contracted etiology unknown.   Symptomatic anemia, likely secondary to  CLL -PRBC x1 Given IVF. Echo with EF and no WMA.   Sinus bradycardia Asx.  F/u with cardiology for monitor.     CLL with worsening of leukocytosis -Outpatient follow-up with oncology to initiate chemo/immunotherapy.    CKD stage II -Was mildly volume depleted, now appears to be euvolemic, creatinine level stable.   Moderate protein calorie malnutrition -Secondary to CLL    HLD On statin     Discharge Diagnoses:  Principal Problem:   Syncope Active Problems:   Chronic lymphocytic leukemia (CLL), B-cell (HCC)   Symptomatic anemia   Syncope, vasovagal   CKD (chronic kidney disease) stage 2, GFR 60-89 ml/min   Sinus bradycardia    Discharge Instructions  Discharge Instructions     Diet - low sodium heart healthy   Complete by: As directed    Discharge instructions   Complete by: As directed    Follow-up with Dr. Mariah Milling cardiology Hydrate   Increase activity slowly   Complete by: As directed       Allergies as of 06/16/2022       Reactions   Penicillins    Other reaction(s): Rash, Hives ()   Latex Rash        Medication List     TAKE these medications    aspirin EC 81 MG tablet Take 81 mg by mouth daily. Swallow whole.   Calquence 100 MG Tabs Generic drug: acalabrutinib maleate Take 1 tablet by mouth 2 (two) times daily.   gabapentin 300 MG capsule Commonly known as: NEURONTIN Take by mouth.   hydrocortisone cream 1 % Apply 1 application. topically 2 (two) times daily.   pantoprazole 20 MG  tablet Commonly known as: PROTONIX Take 1 tablet (20 mg total) by mouth daily. Take 1 hour prior to breakfast   pravastatin 80 MG tablet Commonly known as: PRAVACHOL Take 1 tablet (80 mg total) by mouth daily.   traZODone 50 MG tablet Commonly known as: DESYREL Take 0.5-1 tablets (25-50 mg total) by mouth at bedtime as needed for sleep.        Follow-up Information     Antonieta Iba, MD Follow up in 1 week(s).   Specialty:  Cardiology Why: bradycardia Contact information: [address] [place] [address] [phone]         Allegra Grana, FNP Follow up in 1 week(s).   Specialty: Family Medicine Contact information: [address] [place] [address] [phone]                Allergies  Allergen Reactions   Penicillins     Other reaction(s): Rash, Hives ()   Latex Rash    Consultations:    Procedures/Studies: ECHOCARDIOGRAM COMPLETE  Result Date: 06/16/2022    ECHOCARDIOGRAM REPORT   Patient Name:   [name] Date of Exam: 06/16/2022 Medical Rec #:  [id number]    Height:       68.0 in Accession #:    [id number]   Weight:       129.6 lb Date of Birth:  09-17-1951    BSA:          1.700 m Patient Age:    70 years     BP:           151/64 mmHg Patient Gender: M            HR:           43 bpm. Exam Location:  [facility] Procedure: 2D Echo, Cardiac Doppler and Color Doppler Indications:     R55 Syncope  History:         Patient has no prior history of Echocardiogram examinations.                  Risk Factors:Dyslipidemia.  Sonographer:     Eulah Pont RDCS Referring Phys:  [id number] [facility] [name] Diagnosing Phys: Yvonne Kendall MD IMPRESSIONS  1. Left ventricular ejection fraction, by estimation, is 55 to 60%. The left ventricle has normal function. The left ventricle has no regional wall motion abnormalities. Left ventricular diastolic parameters are indeterminate.  2. Right ventricular systolic function is low normal. The right ventricular size is mildly enlarged. There is normal pulmonary artery systolic pressure.  3. Right atrial size was mildly dilated.  4. The mitral valve is normal in structure. Mild mitral valve regurgitation. No evidence of mitral stenosis.  5. The aortic valve is tricuspid. There is mild thickening of the aortic valve. Aortic valve regurgitation is mild. Aortic valve sclerosis is present, with no evidence of aortic valve stenosis.  6. There  is borderline dilatation of the ascending aorta, measuring 36 mm.  7. The inferior vena cava is normal in size with greater than 50% respiratory variability, suggesting right atrial pressure of 3 mmHg. FINDINGS  Left Ventricle: Left ventricular ejection fraction, by estimation, is 55 to 60%. The left ventricle has normal function. The left ventricle has no regional wall motion abnormalities. The left ventricular internal cavity size was normal in size. There is  no left ventricular hypertrophy. Left ventricular diastolic parameters are indeterminate. Right Ventricle: The right ventricular size is mildly enlarged. No increase in  right ventricular wall thickness. Right ventricular systolic function is low normal. There is normal pulmonary artery systolic pressure. The tricuspid regurgitant velocity is 1.71 m/s, and with an assumed right atrial pressure of 3 mmHg, the estimated right ventricular systolic pressure is 14.7 mmHg. Left Atrium: Left atrial size was normal in size. Right Atrium: Right atrial size was mildly dilated. Pericardium: The pericardium was not well visualized. Mitral Valve: The mitral valve is normal in structure. Mild mitral valve regurgitation. No evidence of mitral valve stenosis. Tricuspid Valve: The tricuspid valve is normal in structure. Tricuspid valve regurgitation is trivial. Aortic Valve: The aortic valve is tricuspid. There is mild thickening of the aortic valve. Aortic valve regurgitation is mild. Aortic regurgitation PHT measures 770 msec. Aortic valve sclerosis is present, with no evidence of aortic valve stenosis. Pulmonic Valve: The pulmonic valve was normal in structure. Pulmonic valve regurgitation is trivial. No evidence of pulmonic stenosis. Aorta: The aortic root is normal in size and structure. There is borderline dilatation of the ascending aorta, measuring 36 mm. Pulmonary Artery: The pulmonary artery is not well seen. Venous: The inferior vena cava is normal in size with  greater than 50% respiratory variability, suggesting right atrial pressure of 3 mmHg. IAS/Shunts: The interatrial septum was not well visualized.  LEFT VENTRICLE PLAX 2D LVIDd:         5.21 cm     Diastology LVIDs:         3.23 cm     LV e' medial:    7.90 cm/s LV PW:         0.69 cm     LV E/e' medial:  6.8 LV IVS:        0.67 cm     LV e' lateral:   9.48 cm/s LVOT diam:     2.10 cm     LV E/e' lateral: 5.7 LV SV:         93 LV SV Index:   55 LVOT Area:     3.46 cm  LV Volumes (MOD) LV vol d, MOD A2C: 95.6 ml LV vol d, MOD A4C: 59.8 ml LV vol s, MOD A2C: 34.2 ml LV vol s, MOD A4C: 23.8 ml LV SV MOD A2C:     61.4 ml LV SV MOD A4C:     59.8 ml LV SV MOD BP:      48.7 ml RIGHT VENTRICLE RV Basal diam:  4.40 cm RV S prime:     9.89 cm/s TAPSE (M-mode): 2.5 cm LEFT ATRIUM           Index        RIGHT ATRIUM           Index LA diam:      3.90 cm 2.29 cm/m   RA Area:     19.60 cm LA Vol (A2C): 45.3 ml 26.65 ml/m  RA Volume:   53.50 ml  31.47 ml/m LA Vol (A4C): 50.7 ml 29.83 ml/m  AORTIC VALVE LVOT Vmax:   111.00 cm/s LVOT Vmean:  73.500 cm/s LVOT VTI:    0.269 m AI PHT:      770 msec  AORTA Ao Root diam: 3.50 cm Ao Asc diam:  3.60 cm MITRAL VALVE               TRICUSPID VALVE MV Area (PHT): 2.32 cm    TR Peak grad:   11.7 mmHg MV Decel Time: 327 msec    TR Vmax:  171.00 cm/s MV E velocity: 53.90 cm/s MV A velocity: 65.20 cm/s  SHUNTS MV E/A ratio:  0.83        Systemic VTI:  0.27 m                            Systemic Diam: 2.10 cm Yvonne Kendall MD Electronically signed by Yvonne Kendall MD Signature Date/Time: 06/16/2022/2:50:44 PM    Final    DG Chest Portable 1 View  Result Date: 06/15/2022 CLINICAL DATA:  Single episode this morning. EXAM: PORTABLE CHEST 1 VIEW COMPARISON:  05/12/2021 FINDINGS: The heart size and mediastinal contours are within normal limits. Both lungs are clear. The visualized skeletal structures are unremarkable. IMPRESSION: No active disease. Electronically Signed   By: Elige Ko M.D.   On: 06/15/2022 11:20   [name] CORE BIOPSY (LYMPH NODES)  Result Date: 06/08/2022 INDICATION: CLL. EXAM: ULTRASOUND-GUIDED LEFT INGUINAL LYMPH NODE BIOPSY COMPARISON:  PET-CT, 05/28/2022.  CT CAP, 05/08/2022. MEDICATIONS: None ANESTHESIA/SEDATION: Local anesthetic was administered. COMPLICATIONS: None immediate. TECHNIQUE: Informed written consent was obtained from the patient and/or patient's representative after a discussion of the risks, benefits and alternatives to treatment. Questions regarding the procedure were encouraged and answered. Initial ultrasound scanning demonstrated pathologically-enlarged LEFT inguinal lymph nodes. An ultrasound image was saved for documentation purposes. The procedure was planned. A timeout was performed prior to the initiation of the procedure. The operative was prepped and draped in the usual sterile fashion, and a sterile drape was applied covering the operative field. A timeout was performed prior to the initiation of the procedure. Local anesthesia was provided with 1% lidocaine. Under direct ultrasound guidance, an 18 gauge core needle device was utilized to obtain to obtain 3 core needle biopsies of the LEFT inguinal lymph node. The samples were placed in saline and submitted to pathology. The needle was removed and hemostasis was achieved with manual compression. Post procedure scan was negative for significant hematoma. A dressing was placed. The patient tolerated the procedure well without immediate postprocedural complication. IMPRESSION: Successful ultrasound guided biopsy of the enlarged LEFT inguinal lymph node, as above. Roanna Banning, MD [facility] Electronically Signed   By: Roanna Banning M.D.   On: 06/08/2022 10:35   NM PET Image Restage (PS) Skull Base to Thigh (F-18 FDG)  Result Date: 05/30/2022 CLINICAL DATA:  Subsequent treatment strategy for chronic lymphocytic leukemia. EXAM: NUCLEAR  MEDICINE PET SKULL BASE TO THIGH TECHNIQUE: 7.27 mCi F-18 FDG was injected intravenously. Full-ring PET imaging was performed from the skull base to thigh after the radiotracer. CT data was obtained and used for attenuation correction and anatomic localization. Fasting blood glucose: 92 mg/dl COMPARISON:  CT AP 40/98/1191 FINDINGS: Mediastinal blood pool activity: SUV max 1.94 Liver activity: SUV max 2.72. NECK: No hypermetabolic lymph nodes in the neck. Incidental CT findings: none CHEST: Again noted are prominent bilateral axillary lymph nodes. -Index right axillary lymph node adjacent to surgical clips measures 1.2 cm and has an SUV max 1.49, image 83/2. -Index left axillary lymph node measures 1.1 cm with SUV max 1.52, image 63/2. No enlarged or tracer avid mediastinal, hilar or supraclavicular lymph nodes. No tracer avid pulmonary nodules. There are a few scattered subpleural nodules within the periphery of both upper lobes which are technically too small to reliably characterize measuring up to 4 mm, image 83/2. Incidental CT findings: Aortic atherosclerosis and coronary artery calcifications. Paraseptal and centrilobular emphysema. ABDOMEN/PELVIS:  No abnormal tracer uptake identified within the liver, pancreas, or adrenal glands. -The spleen measures 11 cm in cranial caudal dimension and is displaced into the left upper quadrant of the abdomen by the bulky abdominal adenopathy. Mild diffuse increased uptake within the spleen has an SUV max 2.62 which is slightly less than background liver activity. Bulky abdominal adenopathy is again identified as reported on recent CT of the abdomen and pelvis from 05/08/2022. -Dominant nodal conglomeration within the abdomen which encases the mesenteric vasculature measures 18.5 x 9.4 by 22 cm has an SUV max 3.15, image 155/2. Bilateral pelvic adenopathy is identified, including: -Left external iliac lymph node measuring 2.5 cm with SUV max 2.89, image 199/2. -Right pelvic  sidewall lymph node measures 1.5 cm with SUV max of 3.3, image 196/2. -Left inguinal lymph node measures 1.2 cm with SUV max of 2.4. Incidental CT findings: Aortic atherosclerotic calcifications. No ascites. SKELETON: No focal hypermetabolic activity to suggest skeletal metastasis. Incidental CT findings: none IMPRESSION: 1. Again seen is massive nodal conglomeration within the abdomen with mildly diffuse increased tracer uptake with SUV max of 3.15. There also enlarged bilateral pelvic and left inguinal lymph nodes which exhibit mild increased tracer uptake compatible with residual/recurrent lymphoma. Imaging findings are compatible with residual/recurrent metabolically active low-grade lymphoma. 2. No significant tracer uptake associated with the borderline enlarged bilateral axillary lymph nodes. 3. Unchanged appearance of small nonspecific subpleural nodules within the periphery of the upper lobes measuring up to 4 mm. These are too small to characterize by PET-CT. 4. Aortic Atherosclerosis (ICD10-I70.0) and Emphysema (ICD10-J43.9). 5. Coronary artery calcifications. 6. Normal size spleen with background activity similar to the liver. Electronically Signed   By: Signa Kell M.D.   On: 05/30/2022 11:17      Subjective: No sob, cp or dizziness  Discharge Exam: Vitals:   06/16/22 0742 06/16/22 1134  BP: (!) 151/64 (!) 109/58  Pulse: (!) 43 (!) 44  Resp: 14 16  Temp: (!) 97.4 F (36.3 C) 97.7 F (36.5 C)  SpO2: 100% 100%   Vitals:   06/16/22 0032 06/16/22 0544 06/16/22 0742 06/16/22 1134  BP: (!) 101/58 105/67 (!) 151/64 (!) 109/58  Pulse: (!) 48 (!) 45 (!) 43 (!) 44  Resp: 16 16 14 16   Temp: 98.1 F (36.7 C) 98 F (36.7 C) (!) 97.4 F (36.3 C) 97.7 F (36.5 C)  TempSrc:   Oral Oral  SpO2: 99% 100% 100% 100%  Weight:      Height:        General: Pt is alert, awake, not in acute distress Cardiovascular: RRR, S1/S2 +, no rubs, no gallops Respiratory: CTA bilaterally, no wheezing,  no rhonchi Abdominal: Soft, NT, ND, bowel sounds + Extremities: no edema, no cyanosis    The results of significant diagnostics from this hospitalization (including imaging, microbiology, ancillary and laboratory) are listed below for reference.     Microbiology: No results found for this or any previous visit (from the past 240 hour(s)).   Labs: BNP (last 3 results) No results for input(s): "BNP" in the last 8760 hours. Basic Metabolic Panel: Recent Labs  Lab 06/15/22 1017 06/16/22 0848  NA 138  --   K 4.9 4.9  CL 110  --   CO2 24  --   GLUCOSE 93  --   BUN 17  --   CREATININE 1.39* 1.36*  CALCIUM 8.9  --    Liver Function Tests: Recent Labs  Lab 06/15/22 1017  AST 23  ALT 12  ALKPHOS 71  BILITOT 0.7  PROT 6.2*  ALBUMIN 3.6   No results for input(s): "LIPASE", "AMYLASE" in the last 168 hours. No results for input(s): "AMMONIA" in the last 168 hours. CBC: Recent Labs  Lab 06/15/22 1017  WBC 75.3*  75.2*  NEUTROABS 1.5*  HGB 8.3*  8.3*  HCT 28.4*  27.9*  MCV 88.2  87.2  PLT 141*  146*   Cardiac Enzymes: No results for input(s): "CKTOTAL", "CKMB", "CKMBINDEX", "TROPONINI" in the last 168 hours. BNP: Invalid input(s): "POCBNP" CBG: No results for input(s): "GLUCAP" in the last 168 hours. D-Dimer No results for input(s): "DDIMER" in the last 72 hours. Hgb A1c No results for input(s): "HGBA1C" in the last 72 hours. Lipid Profile No results for input(s): "CHOL", "HDL", "LDLCALC", "TRIG", "CHOLHDL", "LDLDIRECT" in the last 72 hours. Thyroid function studies Recent Labs    06/15/22 1230  TSH 2.323   Anemia work up No results for input(s): "VITAMINB12", "FOLATE", "FERRITIN", "TIBC", "IRON", "RETICCTPCT" in the last 72 hours. Urinalysis    Component Value Date/Time   COLORURINE YELLOW (A) 06/15/2022 1230   APPEARANCEUR CLEAR (A) 06/15/2022 1230   APPEARANCEUR Clear 03/05/2017 1556   LABSPEC 1.015 06/15/2022 1230   LABSPEC 1.014 02/07/2014  2350   PHURINE 6.0 06/15/2022 1230   GLUCOSEU NEGATIVE 06/15/2022 1230   GLUCOSEU NEGATIVE 01/11/2018 1123   HGBUR NEGATIVE 06/15/2022 1230   BILIRUBINUR NEGATIVE 06/15/2022 1230   BILIRUBINUR Negative 03/05/2017 1556   BILIRUBINUR Negative 02/07/2014 2350   KETONESUR NEGATIVE 06/15/2022 1230   PROTEINUR NEGATIVE 06/15/2022 1230   UROBILINOGEN 0.2 01/11/2018 1123   NITRITE NEGATIVE 06/15/2022 1230   LEUKOCYTESUR NEGATIVE 06/15/2022 1230   LEUKOCYTESUR Negative 02/07/2014 2350   Sepsis Labs Recent Labs  Lab 06/15/22 1017  WBC 75.3*  75.2*   Microbiology No results found for this or any previous visit (from the past 240 hour(s)).   Time coordinating discharge: Over 30 minutes  SIGNED:   Lynn Ito, MD  [facility] 06/16/2022, 3:42 PM Pager   If 7PM-7AM, please contact night-coverage [email or web address] Password [email or web address]

## 2022-06-16 NOTE — Progress Notes (Signed)
  Echocardiogram 2D Echocardiogram has been performed.  [name] 06/16/2022, 1:41 PM

## 2022-06-17 ENCOUNTER — Telehealth: Payer: Self-pay

## 2022-06-17 ENCOUNTER — Other Ambulatory Visit (HOSPITAL_COMMUNITY): Payer: Self-pay

## 2022-06-17 LAB — BPAM RBC
Blood Product Expiration Date: 202307162359
ISSUE DATE / TIME: 202306261635
Unit Type and Rh: 600

## 2022-06-17 LAB — TYPE AND SCREEN
ABO/RH(D): A NEG
Antibody Screen: NEGATIVE
Unit division: 0

## 2022-06-17 NOTE — Telephone Encounter (Signed)
Transition Care Management Unsuccessful Follow-up Telephone Call  Date of discharge and from where:  06/16/22  [facility] Attempts:  1st Attempt  Reason for unsuccessful TCM follow-up call:  Unable to reach patient. Will follow as appropriate.

## 2022-06-17 NOTE — Telephone Encounter (Signed)
Oral Chemotherapy Pharmacist Encounter  [name] will deliver on 06/19/22. He will get started when he has medication in hand.  Patient Education I spoke with patient for overview of new oral chemotherapy medication: Calquence (acalabrutinib) for the treatment of CLL (previously under observation), planned duration until disease progression or unacceptable drug toxicity.  Pt is doing well. Counseled patient on administration, dosing, side effects, monitoring, drug-food interactions, safe handling, storage, and disposal. Patient will take 1 tablet by mouth 2 (two) times daily.  Side effects include but not limited to: headache, diarrhea, fatigue, decreased wbc/hgb/plt.    Reviewed with patient importance of keeping a medication schedule and plan for any missed doses.  After discussion with patient no patient barriers to medication adherence identified.   [name] voiced understanding and appreciation. All questions answered. Medication handout provided.  Provided patient with Oral [facility] phone number. Patient knows to call the office with questions or concerns. Oral Chemotherapy [facility] will continue to follow.  Darl Pikes, PharmD, BCPS, BCOP, CPP Hematology/Oncology Clinical Pharmacist Practitioner [place]/DB/AP [facility] [phone]  06/17/2022 3:47 PM

## 2022-06-18 ENCOUNTER — Other Ambulatory Visit: Payer: Self-pay

## 2022-06-18 ENCOUNTER — Other Ambulatory Visit (HOSPITAL_COMMUNITY): Payer: Self-pay

## 2022-06-18 DIAGNOSIS — C9110 Chronic lymphocytic leukemia of B-cell type not having achieved remission: Secondary | ICD-10-CM

## 2022-06-18 NOTE — Telephone Encounter (Signed)
Oral Oncology Patient Advocate Encounter  I spoke with [name] on 06/17/22 to set up delivery of Calquence. Address verified for shipment and will be filled through [facility] and mailed 06/18/22 for delivery 06/19/22.    [name] will call 7-10 days before next refill is due to complete adherence call and set up delivery of medication.     [place] Patient [place] Phone [phone] Fax [phone] 06/18/2022 11:57 AM

## 2022-06-18 NOTE — Telephone Encounter (Signed)
Per [facility], Pt will have Calquence in had to get started on Friday.  Please schedule patient for lab/MD (cbc,cmp) during the week of 7/10. Please inform pt of appt details.

## 2022-06-22 NOTE — Telephone Encounter (Signed)
Transition Care Management Unsuccessful Follow-up Telephone Call  Date of discharge and from where:  06/16/22 [facility]  Attempts:  2nd Attempt  Reason for unsuccessful TCM follow-up call:  Unable to leave message

## 2022-06-24 NOTE — Telephone Encounter (Signed)
Transition Care Management Unsuccessful Follow-up Telephone Call  Date of discharge and from where:  06/16/22 [facility]  Attempts:  3rd Attempt  Reason for unsuccessful TCM follow-up call:  Unable to leave message. No voicemail. This closes nurse attempts to schedule tcm/hfu.

## 2022-06-25 ENCOUNTER — Other Ambulatory Visit (HOSPITAL_COMMUNITY): Payer: Self-pay

## 2022-06-29 ENCOUNTER — Encounter: Payer: Self-pay | Admitting: Oncology

## 2022-06-29 ENCOUNTER — Inpatient Hospital Stay: Payer: Medicare HMO | Attending: Oncology

## 2022-06-29 ENCOUNTER — Inpatient Hospital Stay: Payer: Medicare HMO | Admitting: Oncology

## 2022-06-29 ENCOUNTER — Inpatient Hospital Stay: Payer: Medicare HMO | Admitting: Pharmacist

## 2022-06-29 DIAGNOSIS — C9111 Chronic lymphocytic leukemia of B-cell type in remission: Secondary | ICD-10-CM

## 2022-06-29 DIAGNOSIS — D649 Anemia, unspecified: Secondary | ICD-10-CM | POA: Diagnosis not present

## 2022-06-29 DIAGNOSIS — C9110 Chronic lymphocytic leukemia of B-cell type not having achieved remission: Secondary | ICD-10-CM | POA: Insufficient documentation

## 2022-06-29 DIAGNOSIS — J432 Centrilobular emphysema: Secondary | ICD-10-CM | POA: Insufficient documentation

## 2022-06-29 DIAGNOSIS — Z7189 Other specified counseling: Secondary | ICD-10-CM

## 2022-06-29 DIAGNOSIS — R630 Anorexia: Secondary | ICD-10-CM | POA: Insufficient documentation

## 2022-06-29 DIAGNOSIS — D509 Iron deficiency anemia, unspecified: Secondary | ICD-10-CM | POA: Diagnosis not present

## 2022-06-29 DIAGNOSIS — Z720 Tobacco use: Secondary | ICD-10-CM | POA: Diagnosis not present

## 2022-06-29 DIAGNOSIS — R5383 Other fatigue: Secondary | ICD-10-CM | POA: Insufficient documentation

## 2022-06-29 DIAGNOSIS — D561 Beta thalassemia: Secondary | ICD-10-CM | POA: Insufficient documentation

## 2022-06-29 LAB — COMPREHENSIVE METABOLIC PANEL
ALT: 12 U/L (ref 0–44)
AST: 20 U/L (ref 15–41)
Albumin: 4.1 g/dL (ref 3.5–5.0)
Alkaline Phosphatase: 75 U/L (ref 38–126)
Anion gap: 7 (ref 5–15)
BUN: 20 mg/dL (ref 8–23)
CO2: 23 mmol/L (ref 22–32)
Calcium: 8.7 mg/dL — ABNORMAL LOW (ref 8.9–10.3)
Chloride: 103 mmol/L (ref 98–111)
Creatinine, Ser: 1.26 mg/dL — ABNORMAL HIGH (ref 0.61–1.24)
GFR, Estimated: >60 mL/min (ref >60)
Glucose, Bld: 134 mg/dL — ABNORMAL HIGH (ref 70–99)
Potassium: 4.7 mmol/L (ref 3.5–5.1)
Sodium: 133 mmol/L — ABNORMAL LOW (ref 135–145)
Total Bilirubin: 0.8 mg/dL (ref 0.3–1.2)
Total Protein: 6.9 g/dL (ref 6.5–8.1)

## 2022-06-29 LAB — CBC WITH DIFFERENTIAL/PLATELET
Abs Immature Granulocytes: 0 10*3/uL (ref 0.00–0.07)
Basophils Absolute: 0 10*3/uL (ref 0.0–0.1)
Basophils Relative: 0 %
Eosinophils Absolute: 0 10*3/uL (ref 0.0–0.5)
Eosinophils Relative: 0 %
HCT: 31.8 % — ABNORMAL LOW (ref 39.0–52.0)
Hemoglobin: 9.4 g/dL — ABNORMAL LOW (ref 13.0–17.0)
Lymphocytes Relative: 56 %
Lymphs Abs: 85.3 10*3/uL — ABNORMAL HIGH (ref 0.7–4.0)
MCH: 26.6 pg (ref 26.0–34.0)
MCHC: 29.6 g/dL — ABNORMAL LOW (ref 30.0–36.0)
MCV: 89.8 fL (ref 80.0–100.0)
Monocytes Absolute: 65.5 10*3/uL — ABNORMAL HIGH (ref 0.1–1.0)
Monocytes Relative: 43 %
Neutro Abs: 1.5 10*3/uL — ABNORMAL LOW (ref 1.7–7.7)
Neutrophils Relative %: 1 %
Platelets: 136 10*3/uL — ABNORMAL LOW (ref 150–400)
RBC: 3.54 MIL/uL — ABNORMAL LOW (ref 4.22–5.81)
RDW: 17.4 % — ABNORMAL HIGH (ref 11.5–15.5)
Smear Review: NORMAL
WBC Morphology: ABNORMAL
WBC: 152.4 10*3/uL (ref 4.0–10.5)
nRBC: 0 % (ref 0.0–0.2)

## 2022-06-29 MED ORDER — LOPERAMIDE HCL 2 MG PO CAPS: 2.0000 mg | ORAL_CAPSULE | ORAL | 0 refills | Status: DC

## 2022-06-29 NOTE — Assessment & Plan Note (Signed)
Discussed with patient

## 2022-06-29 NOTE — Progress Notes (Signed)
Hematology/Oncology Progress note [phone] [phone]   ASSESSMENT & PLAN:   Chronic lymphocytic leukemia (CLL), B-cell ([place]) Reviewed and discussed with patient Worsening of leukocytosis is as expected. Labs reviewed and discussed with patient Continue acalabrutinib 100 mg twice daily.  Goals of care, counseling/discussion Discussed with patient.  Tobacco abuse Recommend smoke cessation.  Normocytic anemia Secondary to CLL with bone marrow involvement .  Hemoglobin has improved. No orders of the defined types were placed in this encounter.  Follow-up in 2 weeks. All questions were answered. The patient knows to call the clinic with any problems, questions or concerns.  Earlie Server, MD, PhD [facility] Hematology Oncology 06/29/2022    Chief Complaint: [name] is a 71 y.o. 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  HPI:  The patient was last seen in the medical onocology clinic on 12/25/2016. Patient reports feeling profound fatigue, lack of appetite, weight loss. He weight 128 pounds today, comparing to 137 pounds when he was here in January 2018.  He met with [name], registered dietitian on 01/04/2017. Patient used to follow up with Dr.[name].   Treatment history: He originally presented with hemolytic anemia in 01/2014.  He was treated with prednisone. With taper of prednisone, his hemolysis returned.I think it's only positives are Per Dr.[name]'s note, patient received 4 weekly cycles of Rituxan (10/02/2014- 10/23/2014).   07/23/2017 Decision was made to start Ibrutinib as patient is very symptomatic (fatigue and weight loss, lack of appetite). Patient did not show up at the chemo education class. He was never started on Ibrutinib and his constitutional symptoms and is symptoms improved.  We agreed on not to proceed with treatments as it was not clear whether his symptoms are secondary  to CLL or not. Patient was recommended for watchful waiting. 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   After visit on 06/03/2018, he lost follow-up  seek second opinion at [facility] and was evaluated by heme-onc Dr.Ellis.  Patient was recommended for watchful waiting.  Patient was seen by [facility] heme-onc in October 2020   Patient reestablish care on 05/07/2020 and lost follow-up again -reestablish care on 02/19/2022 and no-show to a follow-up appointment.  He has slowly progressive microcytic anemia.  Colonoscopy on 07/23/2015 revealed diverticulosis in the sigmoid colon.  EGD on 08/20/2015 was normal. Guaiac cards x 3 were negative in 06/2015.  His diet fluctuates with his appetite.  He denies any melana or hematochezia.  INTERVAL HISTORY 71 y.o. male with history of CLL presents for follow-up. Started on acalabrutinib 100 mg daily. + Headache, he drinks coffee and Tylenol with some pressure relief. + Tenderness of left groin area. + Fatigue.  He has gained some weight.     Past Medical History:  Diagnosis Date   Anemia 2008   Arthritis    Cervicalgia    Chronic lymphocytic leukemia (CLL), B-cell ([place]) 06/15/2015   Dr. Tasia Catchings   Hyperlipidemia    Low back pain    Peripheral vascular disease [facility])     Past Surgical History:  Procedure Laterality Date   COLONOSCOPY     COLONOSCOPY WITH PROPOFOL N/A 07/23/2015   Procedure: COLONOSCOPY WITH PROPOFOL;  Surgeon: [name], MD;  Location: [facility] ENDOSCOPY;  Service: Endoscopy;  Laterality: N/A;   COLONOSCOPY WITH PROPOFOL N/A 04/20/2022   Procedure: COLONOSCOPY WITH PROPOFOL;  Surgeon: Jonathon Bellows, MD;  Location: [facility] ENDOSCOPY;  Service: Gastroenterology;  Laterality: N/A;  ESOPHAGOGASTRODUODENOSCOPY (EGD) WITH PROPOFOL N/A 08/20/2015   Procedure: ESOPHAGOGASTRODUODENOSCOPY (EGD) WITH PROPOFOL;  Surgeon: [name], MD;  Location: [facility] ENDOSCOPY;  Service: Endoscopy;  Laterality: N/A;   LOWER EXTREMITY ANGIOGRAPHY Left  05/19/2018   Procedure: LOWER EXTREMITY ANGIOGRAPHY;  Surgeon: Algernon Huxley, MD;  Location: [place] CV LAB;  Service: Cardiovascular;  Laterality: Left;   LOWER EXTREMITY ANGIOGRAPHY Right 06/15/2018   Procedure: LOWER EXTREMITY ANGIOGRAPHY;  Surgeon: Algernon Huxley, MD;  Location: [place] CV LAB;  Service: Cardiovascular;  Laterality: Right;   lymp node removal Right 2011   arm   left arm 2015   LYMPH NODE BIOPSY  08/2014    Family History  Problem Relation Age of Onset   Heart attack Father    Kidney cancer Neg Hx        lung cancer   Bladder Cancer Neg Hx    Prostate cancer Neg Hx     Social History:  reports that he has quit smoking. His smoking use included cigars. He has never used smokeless tobacco. He reports that he does not drink alcohol and does not use drugs.  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  Allergies:  Allergies  Allergen Reactions   Penicillins     Other reaction(s): Rash, Hives ()   Latex Rash   Review of Systems:  Review of Systems  Constitutional:  Positive for malaise/fatigue and weight loss. Negative for chills and fever.  HENT:  Negative for ear discharge, ear pain, nosebleeds and sore throat.   Eyes:  Negative for photophobia, pain and redness.  Respiratory:  Negative for cough, hemoptysis, sputum production, shortness of breath and wheezing.   Cardiovascular:  Negative for chest pain, palpitations and leg swelling.  Gastrointestinal:  Negative for abdominal pain, blood in stool, constipation, diarrhea, heartburn, nausea and vomiting.  Genitourinary:  Negative for dysuria and frequency.       Swelling of groin area.  Musculoskeletal:  Negative for myalgias and neck pain.  Skin:  Negative for rash.  Neurological:  Negative for dizziness, tingling, tremors, weakness and headaches.  Endo/Heme/Allergies:  Does not bruise/bleed easily.  Psychiatric/Behavioral:  Negative  for depression, hallucinations and suicidal ideas.      Current Outpatient Medications on File Prior to Visit  Medication Sig Dispense Refill   acalabrutinib maleate (CALQUENCE) 100 MG TABS Take 1 tablet by mouth 2 (two) times daily. 60 tablet 0   aspirin EC 81 MG tablet Take 81 mg by mouth daily. Swallow whole. 90 tablet 3   gabapentin (NEURONTIN) 300 MG capsule Take by mouth.     hydrocortisone cream 1 % Apply 1 application. topically 2 (two) times daily. 30 g 0   pantoprazole (PROTONIX) 20 MG tablet Take 1 tablet (20 mg total) by mouth daily. Take 1 hour prior to breakfast 30 tablet 2   pravastatin (PRAVACHOL) 80 MG tablet Take 1 tablet (80 mg total) by mouth daily. 90 tablet 3   traZODone (DESYREL) 50 MG tablet Take 0.5-1 tablets (25-50 mg total) by mouth at bedtime as needed for sleep. 30 tablet 3   No current facility-administered medications on file prior to visit.    Physical Exam: Blood pressure 109/71, pulse 98, temperature 97.8 F (36.6 C), resp. rate 18, weight 125 lb 1.6 oz (56.7 kg). Physical Exam Constitutional:      General: He is not in acute distress.  Appearance: He is not diaphoretic.  HENT:     Head: Normocephalic and atraumatic.     Nose: Nose normal.     Mouth/Throat:     Pharynx: No oropharyngeal exudate.  Eyes:     General: No scleral icterus.    Pupils: Pupils are equal, round, and reactive to light.  Cardiovascular:     Rate and Rhythm: Normal rate and regular rhythm.     Heart sounds: No murmur heard. Pulmonary:     Effort: Pulmonary effort is normal. No respiratory distress.     Breath sounds: No rales.  Chest:     Chest wall: No tenderness.  Abdominal:     General: There is no distension.     Palpations: Abdomen is soft. There is mass.     Tenderness: There is no abdominal tenderness.  Genitourinary:    Comments: Previously palpable left inguinal lymph node, tender Not checked during this encounter. Musculoskeletal:        General:  Normal range of motion.     Cervical back: Normal range of motion and neck supple.  Skin:    General: Skin is warm and dry.     Findings: No erythema.  Neurological:     Mental Status: He is alert and oriented to person, place, and time.     Cranial Nerves: No cranial nerve deficit.     Motor: No abnormal muscle tone.     Coordination: Coordination normal.  Psychiatric:        Mood and Affect: Affect normal.     LABORATORY RESULTS. CBC    Component Value Date/Time   WBC 152.4 (HH) 06/29/2022 0951   RBC 3.54 (L) 06/29/2022 0951   HGB 9.4 (L) 06/29/2022 0951   HGB 13.0 03/13/2015 0852   HCT 31.8 (L) 06/29/2022 0951   HCT 40.9 03/13/2015 0852   PLT 136 (L) 06/29/2022 0951   PLT 217 03/13/2015 0852   MCV 89.8 06/29/2022 0951   MCV 78 (L) 03/13/2015 0852   MCH 26.6 06/29/2022 0951   MCHC 29.6 (L) 06/29/2022 0951   RDW 17.4 (H) 06/29/2022 0951   RDW 16.0 (H) 03/13/2015 0852   LYMPHSABS 85.3 (H) 06/29/2022 0951   LYMPHSABS 3.9 (H) 03/13/2015 0852   MONOABS 65.5 (H) 06/29/2022 0951   MONOABS 0.4 03/13/2015 0852   EOSABS 0.0 06/29/2022 0951   EOSABS 0.4 03/13/2015 0852   BASOSABS 0.0 06/29/2022 0951   BASOSABS 0.1 03/13/2015 0852   CMP     Component Value Date/Time   NA 133 (L) 06/29/2022 0951   NA 138 10/16/2019 0000   NA 136 03/13/2015 0852   K 4.7 06/29/2022 0951   K 3.9 03/13/2015 0852   CL 103 06/29/2022 0951   CL 104 03/13/2015 0852   CO2 23 06/29/2022 0951   CO2 26 03/13/2015 0852   GLUCOSE 134 (H) 06/29/2022 0951   GLUCOSE 96 03/13/2015 0852   BUN 20 06/29/2022 0951   BUN 14 10/16/2019 0000   BUN 11 03/13/2015 0852   CREATININE 1.26 (H) 06/29/2022 0951   CREATININE 1.17 02/03/2019 1612   CALCIUM 8.7 (L) 06/29/2022 0951   CALCIUM 9.3 03/13/2015 0852   PROT 6.9 06/29/2022 0951   PROT 8.1 03/13/2015 0852   ALBUMIN 4.1 06/29/2022 0951   ALBUMIN 4.5 03/13/2015 0852   AST 20 06/29/2022 0951   AST 26 03/13/2015 0852   ALT 12 06/29/2022 0951   ALT 12 (L)  03/13/2015 0852   ALKPHOS 75 06/29/2022 0951  ALKPHOS 57 03/13/2015 0852   BILITOT 0.8 06/29/2022 0951   BILITOT 1.2 03/13/2015 0852   GFRNONAA >60 06/29/2022 0951   GFRNONAA >60 03/13/2015 0852   GFRAA >60 04/25/2020 1123   GFRAA >60 03/13/2015 0852   PATHOLOGY:  11/02/2016 Peripheral blood FISH studies revealed 20% of nuclei positive for loss of 1 ATM signal, 63% of nuclei positive for trisomy 12, and 32% of nuclei positive for loss of 1 TP53 signal. Results for CCND1/IGH and 13q were normal.  09/13/2014 Left axillary node biopsy on  confirmed B-cell small lymphocytic lymphoma (B-CLL/SLL)   IMAGE STUDIES ECHOCARDIOGRAM COMPLETE  Result Date: 06/16/2022    ECHOCARDIOGRAM REPORT   Patient Name:   GALO SAYED Date of Exam: 06/16/2022 Medical Rec #:  [id number]    Height:       68.0 in Accession #:    [id number]   Weight:       129.6 lb Date of Birth:  29-Mar-1951    BSA:          1.700 m Patient Age:    71 years     BP:           151/64 mmHg Patient Gender: M            HR:           43 bpm. Exam Location:  [facility] Procedure: 2D Echo, Cardiac Doppler and Color Doppler Indications:     R55 Syncope  History:         Patient has no prior history of Echocardiogram examinations.                  Risk Factors:Dyslipidemia.  Sonographer:     Bernadene Person RDCS Referring Phys:  [id number] [facility] AMERY Diagnosing Phys: Nelva Bush MD IMPRESSIONS  1. Left ventricular ejection fraction, by estimation, is 55 to 60%. The left ventricle has normal function. The left ventricle has no regional wall motion abnormalities. Left ventricular diastolic parameters are indeterminate.  2. Right ventricular systolic function is low normal. The right ventricular size is mildly enlarged. There is normal pulmonary artery systolic pressure.  3. Right atrial size was mildly dilated.  4. The mitral valve is normal in structure. Mild mitral valve regurgitation. No evidence of mitral stenosis.  5. The aortic valve is tricuspid. There  is mild thickening of the aortic valve. Aortic valve regurgitation is mild. Aortic valve sclerosis is present, with no evidence of aortic valve stenosis.  6. There is borderline dilatation of the ascending aorta, measuring 36 mm.  7. The inferior vena cava is normal in size with greater than 50% respiratory variability, suggesting right atrial pressure of 3 mmHg. FINDINGS  Left Ventricle: Left ventricular ejection fraction, by estimation, is 55 to 60%. The left ventricle has normal function. The left ventricle has no regional wall motion abnormalities. The left ventricular internal cavity size was normal in size. There is  no left ventricular hypertrophy. Left ventricular diastolic parameters are indeterminate. Right Ventricle: The right ventricular size is mildly enlarged. No increase in right ventricular wall thickness. Right ventricular systolic function is low normal. There is normal pulmonary artery systolic pressure. The tricuspid regurgitant velocity is 1.71 m/s, and with an assumed right atrial pressure of 3 mmHg, the estimated right ventricular systolic pressure is 30.8 mmHg. Left Atrium: Left atrial size was normal in size. Right Atrium: Right atrial size was mildly dilated. Pericardium: The pericardium was not well visualized. Mitral Valve: The mitral valve is normal in structure.  Mild mitral valve regurgitation. No evidence of mitral valve stenosis. Tricuspid Valve: The tricuspid valve is normal in structure. Tricuspid valve regurgitation is trivial. Aortic Valve: The aortic valve is tricuspid. There is mild thickening of the aortic valve. Aortic valve regurgitation is mild. Aortic regurgitation PHT measures 770 msec. Aortic valve sclerosis is present, with no evidence of aortic valve stenosis. Pulmonic Valve: The pulmonic valve was normal in structure. Pulmonic valve regurgitation is trivial. No evidence of pulmonic stenosis. Aorta: The aortic root is normal in size and structure. There is borderline  dilatation of the ascending aorta, measuring 36 mm. Pulmonary Artery: The pulmonary artery is not well seen. Venous: The inferior vena cava is normal in size with greater than 50% respiratory variability, suggesting right atrial pressure of 3 mmHg. IAS/Shunts: The interatrial septum was not well visualized.  LEFT VENTRICLE PLAX 2D LVIDd:         5.21 cm     Diastology LVIDs:         3.23 cm     LV e' medial:    7.90 cm/s LV PW:         0.69 cm     LV E/e' medial:  6.8 LV IVS:        0.67 cm     LV e' lateral:   9.48 cm/s LVOT diam:     2.10 cm     LV E/e' lateral: 5.7 LV SV:         93 LV SV Index:   55 LVOT Area:     3.46 cm  LV Volumes (MOD) LV vol d, MOD A2C: 95.6 ml LV vol d, MOD A4C: 59.8 ml LV vol s, MOD A2C: 34.2 ml LV vol s, MOD A4C: 23.8 ml LV SV MOD A2C:     61.4 ml LV SV MOD A4C:     59.8 ml LV SV MOD BP:      48.7 ml RIGHT VENTRICLE RV Basal diam:  4.40 cm RV S prime:     9.89 cm/s TAPSE (M-mode): 2.5 cm LEFT ATRIUM           Index        RIGHT ATRIUM           Index LA diam:      3.90 cm 2.29 cm/m   RA Area:     19.60 cm LA Vol (A2C): 45.3 ml 26.65 ml/m  RA Volume:   53.50 ml  31.47 ml/m LA Vol (A4C): 50.7 ml 29.83 ml/m  AORTIC VALVE LVOT Vmax:   111.00 cm/s LVOT Vmean:  73.500 cm/s LVOT VTI:    0.269 m AI PHT:      770 msec  AORTA Ao Root diam: 3.50 cm Ao Asc diam:  3.60 cm MITRAL VALVE               TRICUSPID VALVE MV Area (PHT): 2.32 cm    TR Peak grad:   11.7 mmHg MV Decel Time: 327 msec    TR Vmax:        171.00 cm/s MV E velocity: 53.90 cm/s MV A velocity: 65.20 cm/s  SHUNTS MV E/A ratio:  0.83        Systemic VTI:  0.27 m                            Systemic Diam: 2.10 cm Nelva Bush MD Electronically signed by Nelva Bush MD Signature Date/Time: 06/16/2022/2:50:44 PM  Final    DG Chest Portable 1 View  Result Date: 06/15/2022 CLINICAL DATA:  Single episode this morning. EXAM: PORTABLE CHEST 1 VIEW COMPARISON:  05/12/2021 FINDINGS: The heart size and mediastinal contours are  within normal limits. Both lungs are clear. The visualized skeletal structures are unremarkable. IMPRESSION: No active disease. Electronically Signed   By: Kathreen Devoid M.D.   On: 06/15/2022 11:20   [name] CORE BIOPSY (LYMPH NODES)  Result Date: 06/08/2022 INDICATION: CLL. EXAM: ULTRASOUND-GUIDED LEFT INGUINAL LYMPH NODE BIOPSY COMPARISON:  PET-CT, 05/28/2022.  CT CAP, 05/08/2022. MEDICATIONS: None ANESTHESIA/SEDATION: Local anesthetic was administered. COMPLICATIONS: None immediate. TECHNIQUE: Informed written consent was obtained from the patient and/or patient's representative after a discussion of the risks, benefits and alternatives to treatment. Questions regarding the procedure were encouraged and answered. Initial ultrasound scanning demonstrated pathologically-enlarged LEFT inguinal lymph nodes. An ultrasound image was saved for documentation purposes. The procedure was planned. A timeout was performed prior to the initiation of the procedure. The operative was prepped and draped in the usual sterile fashion, and a sterile drape was applied covering the operative field. A timeout was performed prior to the initiation of the procedure. Local anesthesia was provided with 1% lidocaine. Under direct ultrasound guidance, an 18 gauge core needle device was utilized to obtain to obtain 3 core needle biopsies of the LEFT inguinal lymph node. The samples were placed in saline and submitted to pathology. The needle was removed and hemostasis was achieved with manual compression. Post procedure scan was negative for significant hematoma. A dressing was placed. The patient tolerated the procedure well without immediate postprocedural complication. IMPRESSION: Successful ultrasound guided biopsy of the enlarged LEFT inguinal lymph node, as above. Michaelle Birks, MD [facility] Radiology Electronically Signed   By: Michaelle Birks M.D.   On: 06/08/2022 10:35   NM PET Image  Restage (PS) Skull Base to Thigh (F-18 FDG)  Result Date: 05/30/2022 CLINICAL DATA:  Subsequent treatment strategy for chronic lymphocytic leukemia. EXAM: NUCLEAR MEDICINE PET SKULL BASE TO THIGH TECHNIQUE: 7.27 mCi F-18 FDG was injected intravenously. Full-ring PET imaging was performed from the skull base to thigh after the radiotracer. CT data was obtained and used for attenuation correction and anatomic localization. Fasting blood glucose: 92 mg/dl COMPARISON:  CT AP 05/08/2022 FINDINGS: Mediastinal blood pool activity: SUV max 1.94 Liver activity: SUV max 2.72. NECK: No hypermetabolic lymph nodes in the neck. Incidental CT findings: none CHEST: Again noted are prominent bilateral axillary lymph nodes. -Index right axillary lymph node adjacent to surgical clips measures 1.2 cm and has an SUV max 1.49, image 83/2. -Index left axillary lymph node measures 1.1 cm with SUV max 1.52, image 63/2. No enlarged or tracer avid mediastinal, hilar or supraclavicular lymph nodes. No tracer avid pulmonary nodules. There are a few scattered subpleural nodules within the periphery of both upper lobes which are technically too small to reliably characterize measuring up to 4 mm, image 83/2. Incidental CT findings: Aortic atherosclerosis and coronary artery calcifications. Paraseptal and centrilobular emphysema. ABDOMEN/PELVIS: No abnormal tracer uptake identified within the liver, pancreas, or adrenal glands. -The spleen measures 11 cm in cranial caudal dimension and is displaced into the left upper quadrant of the abdomen by the bulky abdominal adenopathy. Mild diffuse increased uptake within the spleen has an SUV max 2.62 which is slightly less than background liver activity. Bulky abdominal adenopathy is again identified as reported on recent CT of the abdomen and pelvis from 05/08/2022. -Dominant nodal conglomeration  within the abdomen which encases the mesenteric vasculature measures 18.5 x 9.4 by 22 cm has an SUV max  3.15, image 155/2. Bilateral pelvic adenopathy is identified, including: -Left external iliac lymph node measuring 2.5 cm with SUV max 2.89, image 199/2. -Right pelvic sidewall lymph node measures 1.5 cm with SUV max of 3.3, image 196/2. -Left inguinal lymph node measures 1.2 cm with SUV max of 2.4. Incidental CT findings: Aortic atherosclerotic calcifications. No ascites. SKELETON: No focal hypermetabolic activity to suggest skeletal metastasis. Incidental CT findings: none IMPRESSION: 1. Again seen is massive nodal conglomeration within the abdomen with mildly diffuse increased tracer uptake with SUV max of 3.15. There also enlarged bilateral pelvic and left inguinal lymph nodes which exhibit mild increased tracer uptake compatible with residual/recurrent lymphoma. Imaging findings are compatible with residual/recurrent metabolically active low-grade lymphoma. 2. No significant tracer uptake associated with the borderline enlarged bilateral axillary lymph nodes. 3. Unchanged appearance of small nonspecific subpleural nodules within the periphery of the upper lobes measuring up to 4 mm. These are too small to characterize by PET-CT. 4. Aortic Atherosclerosis (ICD10-I70.0) and Emphysema (ICD10-J43.9). 5. Coronary artery calcifications. 6. Normal size spleen with background activity similar to the liver. Electronically Signed   By: Kerby Moors M.D.   On: 05/30/2022 11:17   CT CHEST ABDOMEN PELVIS W CONTRAST  Result Date: 05/08/2022 CLINICAL DATA:  CLL, fatigue and weight loss, left groin lymphadenopathy palpated * Tracking Code: BO * EXAM: CT CHEST, ABDOMEN, AND PELVIS WITH CONTRAST TECHNIQUE: Multidetector CT imaging of the chest, abdomen and pelvis was performed following the standard protocol during bolus administration of intravenous contrast. RADIATION DOSE REDUCTION: This exam was performed according to the departmental dose-optimization program which includes automated exposure control, adjustment of  the mA and/or kV according to patient size and/or use of iterative reconstruction technique. CONTRAST:  40m OMNIPAQUE IOHEXOL 300 MG/ML SOLN, additional oral enteric contrast COMPARISON:  CT chest, 06/05/2021, PET-CT, 06/24/2016 FINDINGS: CT CHEST FINDINGS Cardiovascular: No significant vascular findings. Normal heart size. Three-vessel coronary artery calcifications. No pericardial effusion. Mediastinum/Nodes: When compared most recent prior CT examination of the chest dated 06/05/2021, unchanged enlarged bilateral axillary lymph nodes, right axillary nodes measuring up to 1.5 x 1.2 cm (series 2, image 16). Surgical clips in the right axilla. Unchanged prominent pretracheal nodes measuring up to 1.4 x 1.3 cm (series 2, image 26). Thyroid gland, trachea, and esophagus demonstrate no significant findings. Lungs/Pleura: Mild centrilobular and paraseptal emphysema. Stable, benign small subpleural pulmonary nodules, for example a 0.5 cm nodule of the lingula (series 3, image 104). No further follow-up or characterization is required. No pleural effusion or pneumothorax. Musculoskeletal: No chest wall mass or suspicious osseous lesions identified. CT ABDOMEN PELVIS FINDINGS Hepatobiliary: No solid liver abnormality is seen. No gallstones, gallbladder wall thickening, or biliary dilatation. Pancreas: Unremarkable. No pancreatic ductal dilatation or surrounding inflammatory changes. Spleen: Normal in size without significant abnormality. Adrenals/Urinary Tract: Adrenal glands are unremarkable. Mild right hydronephrosis, the proximal ureter appears compressed or obstructed by overlying lymphadenopathy. No left-sided hydronephrosis. No calculi or renal mass. Bladder is unremarkable. Stomach/Bowel: Stomach is within normal limits. Appendix appears normal. No evidence of bowel wall thickening, distention, or inflammatory changes. Vascular/Lymphatic: Aortic atherosclerosis. When compared to most recent imaging of the abdomen  and pelvis dated 06/24/2016, there are numerous, extremely bulky lymph nodes throughout the abdomen and pelvis, which are greatly increased in size. Due to bulky, confluent lymphadenopathy involving the upper abdominal, mesenteric, and retroperitoneal stations, these are difficult to  discretely measure, however a large node conglomerate in the ventral abdomen measures at least 14.5 x 6.0 cm (series 2, image 75). Interval enlargement of left iliac/pelvic sidewall lymph nodes, measuring up to 3.4 x 2.9 cm, previously 2.5 x 1.8 cm (series 2, image 106). Newly enlarged left inguinal lymph nodes measuring up to 2.6 x 1.6 cm (series 2, image 111). Reproductive: No mass or other abnormality. Other: No abdominal wall hernia or abnormality. No ascites. Musculoskeletal: No acute osseous findings. IMPRESSION: 1. When compared to most recent prior CT examination of the chest dated 06/05/2021, unchanged enlarged bilateral axillary lymph nodes and prominent pretracheal mediastinal lymph nodes. 2. However, when compared to most recent prior CT examination of the abdomen and pelvis dated 06/24/2016, there are numerous, extremely bulky lymph nodes throughout the abdomen and pelvis, which are greatly increased in size. Newly enlarged left inguinal lymph nodes, in keeping with reported palpable lymphadenopathy. 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 3. Mild right hydronephrosis; the proximal ureter appears compressed or obstructed by overlying lymphadenopathy. 4. Emphysema. 5. Coronary artery disease. These results will be called to the ordering clinician or representative by the Radiologist Assistant, and communication documented in the PACS or [employer]. Aortic Atherosclerosis (ICD10-I70.0) and Emphysema (ICD10-J43.9). Electronically Signed   By: Delanna Ahmadi M.D.   On: 05/08/2022 20:00

## 2022-06-29 NOTE — Assessment & Plan Note (Addendum)
Reviewed and discussed with patient Worsening of leukocytosis is as expected. Labs reviewed and discussed with patient Continue acalabrutinib 100 mg twice daily.

## 2022-06-29 NOTE — Progress Notes (Signed)
[place]  Telephone:([phone] Fax:([phone]) [phone]  Patient Care Team: [name], FNP as PCP - General (Family Medicine) Rockey Situ Kathlene November, MD as PCP - Cardiology (Cardiology) Bary Castilla, Forest Gleason, MD (General Surgery) Forest Gleason, MD (Unknown Physician Specialty) Earlie Server, MD as Consulting Physician (Oncology)   Name of the patient: [name]  [id number]  1951/11/02   Date of visit: 06/29/22  HPI: Patient is a 71 y.o. male with  CLL (previously under observation), now requiring treatment with Calquence (acalabrutinib). He started his acalabrutinib on 06/19/22.  Reason for Consult: Oral chemotherapy follow-up for acalabrutinib therapy.   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 Tasia Catchings   Hyperlipidemia    Low back pain    Peripheral vascular disease [facility])     HEMATOLOGY/ONCOLOGY HISTORY:  Oncology History  Chronic lymphocytic leukemia (CLL), B-cell ([place])  09/13/2014 Initial Diagnosis   CLL - hemolytic anemia in 01/2014.  He was treated with prednisone. 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  -Oct 2020 second opinion at [facility] and was evaluated by heme-onc Dr.Ellis.  Patient was recommended for watchful waiting   10/02/2014 - 10/23/2014 Chemotherapy   The patient had Rituximab weekly x 4 doses for  treatment.     10/02/2014 -  Chemotherapy   -10/02/2014- 10/23/2014  4 weekly cycles of Rituxan  -07/23/2017 Decision was made to start Ibrutinib as patient is very symptomatic (fatigue and weight loss, lack of appetite). Patient did not show up at the chemo education class   05/08/2022 Imaging   CT CHEST, ABDOMEN, AND  PELVIS WITH CONTRAST 1 unchanged enlarged bilateral axillary lymph nodes and prominent pretracheal mediastinal lymph nodes. 2. However, when compared to most recent prior CT examination of the abdomen and pelvis dated 06/24/2016, there are numerous, extremely bulky lymph nodes throughout the abdomen and pelvis, which are greatly increased in size. Newly enlarged left inguinal lymph nodes, in keeping with reported palpable lymphadenopathy. 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 3. Mild right hydronephrosis; the proximal ureter appears compressed or obstructed by overlying lymphadenopathy. 4. Emphysema.5. Coronary artery disease.   05/30/2022 Imaging   PET scan showed  1. Again seen is massive nodal conglomeration within the abdomen with mildly diffuse increased tracer uptake with SUV max of 3.15.There also enlarged bilateral pelvic and left inguinal lymph nodes which exhibit mild increased tracer uptake compatible with residual/recurrent lymphoma. Imaging findings are compatible with residual/recurrent metabolically active low-grade lymphoma. 2. No significant tracer uptake associated with the borderline enlarged bilateral axillary lymph nodes.3. Unchanged appearance of small nonspecific subpleural nodules within the periphery of the upper lobes measuring up to 4 mm. These are too small to characterize by PET-CT.4. Aortic Atherosclerosis (ICD10-I70.0) and Emphysema (ICD10-J43.9). 5. Coronary artery calcifications. 6. Normal size spleen with background activity similar to the liver   06/22/2022 -  Chemotherapy   acalabrutinib 100 mg twice daily.     ALLERGIES:  is allergic to penicillins and latex.  MEDICATIONS:  Current Outpatient Medications  Medication Sig Dispense Refill   acalabrutinib maleate (CALQUENCE) 100 MG TABS Take 1 tablet by mouth 2 (two) times daily. 60 tablet 0   aspirin EC 81 MG tablet Take 81  mg by mouth daily. Swallow whole. 90 tablet 3   gabapentin (NEURONTIN) 300 MG  capsule Take by mouth.     hydrocortisone cream 1 % Apply 1 application. topically 2 (two) times daily. 30 g 0   pantoprazole (PROTONIX) 20 MG tablet Take 1 tablet (20 mg total) by mouth daily. Take 1 hour prior to breakfast 30 tablet 2   pravastatin (PRAVACHOL) 80 MG tablet Take 1 tablet (80 mg total) by mouth daily. 90 tablet 3   traZODone (DESYREL) 50 MG tablet Take 0.5-1 tablets (25-50 mg total) by mouth at bedtime as needed for sleep. 30 tablet 3   No current facility-administered medications for this visit.    VITAL SIGNS: There were no vitals taken for this visit. There were no vitals filed for this visit.  Estimated body mass index is 19.02 kg/m as calculated from the following:   Height as of 06/15/22: '5\' 8"'$  (1.727 m).   Weight as of an earlier encounter on 06/29/22: 56.7 kg (125 lb 1.6 oz).  LABS: CBC:    Component Value Date/Time   WBC PENDING 06/29/2022 0951   HGB 9.4 (L) 06/29/2022 0951   HGB 13.0 03/13/2015 0852   HCT 31.8 (L) 06/29/2022 0951   HCT 40.9 03/13/2015 0852   PLT 136 (L) 06/29/2022 0951   PLT 217 03/13/2015 0852   MCV 89.8 06/29/2022 0951   MCV 78 (L) 03/13/2015 0852   NEUTROABS PENDING 06/29/2022 0951   NEUTROABS 3.5 03/13/2015 0852   LYMPHSABS PENDING 06/29/2022 0951   LYMPHSABS 3.9 (H) 03/13/2015 0852   MONOABS PENDING 06/29/2022 0951   MONOABS 0.4 03/13/2015 0852   EOSABS PENDING 06/29/2022 0951   EOSABS 0.4 03/13/2015 0852   BASOSABS PENDING 06/29/2022 0951   BASOSABS 0.1 03/13/2015 0852   Comprehensive Metabolic Panel:    Component Value Date/Time   NA 133 (L) 06/29/2022 0951   NA 138 10/16/2019 0000   NA 136 03/13/2015 0852   K 4.7 06/29/2022 0951   K 3.9 03/13/2015 0852   CL 103 06/29/2022 0951   CL 104 03/13/2015 0852   CO2 23 06/29/2022 0951   CO2 26 03/13/2015 0852   BUN 20 06/29/2022 0951   BUN 14 10/16/2019 0000   BUN 11 03/13/2015 0852   CREATININE 1.26 (H) 06/29/2022 0951   CREATININE 1.17 02/03/2019 1612   GLUCOSE 134  (H) 06/29/2022 0951   GLUCOSE 96 03/13/2015 0852   CALCIUM 8.7 (L) 06/29/2022 0951   CALCIUM 9.3 03/13/2015 0852   AST 20 06/29/2022 0951   AST 26 03/13/2015 0852   ALT 12 06/29/2022 0951   ALT 12 (L) 03/13/2015 0852   ALKPHOS 75 06/29/2022 0951   ALKPHOS 57 03/13/2015 0852   BILITOT 0.8 06/29/2022 0951   BILITOT 1.2 03/13/2015 0852   PROT 6.9 06/29/2022 0951   PROT 8.1 03/13/2015 0852   ALBUMIN 4.1 06/29/2022 0951   ALBUMIN 4.5 03/13/2015 0852     Present during today's visit: Patient and his wife  Assessment and Plan: Continue acalabrutinib '100mg'$  twice daily CBC/CMP reviewed, WBC/ALC have increased, likely lymphocytosis from starting the acalabrutinib. Will continue to monitor, expect to see decrease over time Patient reports that his abdomin that previously felt hard to him, has softened since starting acalabrutinib    Oral Chemotherapy Side Effect/Intolerance:  Nausea: Patient has tried caffeine for his headaches and thinks this helps some but the headache does not go away completely. He usually uses ibuprofen for headaches, patient was okayed to use ibuprofen  as needed. He knows if his platelets become low he maybe instructed to stop the ibuprofen Diarrhea: one incidence of diarrhea, that resolved on its own, without loperamide  Oral Chemotherapy Adherence: no missed doses No patient barriers to medication adherence identified.   New medications: None reported  Medication Access Issues: No issues, patient fills his medication at [facility]  Patient expressed understanding and was in agreement with this plan. He also understands that He can call clinic at any time with any questions, concerns, or complaints.   Follow-up plan: RTC in 2 weeks  Thank you for allowing me to participate in the care of this very pleasant patient.   Time Total: 15 mins  Visit consisted of counseling and education on dealing with issues of symptom management in the setting of  serious and potentially life-threatening illness.Greater than 50%  of this time was spent counseling and coordinating care related to the above assessment and plan.  Signed by: Darl Pikes, PharmD, BCPS, Salley Slaughter, CPP Hematology/Oncology Clinical Pharmacist Practitioner [place]/DB/AP Oral [facility] [phone]  06/29/2022 10:44 AM

## 2022-06-29 NOTE — Assessment & Plan Note (Signed)
Secondary to CLL with bone marrow involvement .  Hemoglobin has improved.

## 2022-06-29 NOTE — Progress Notes (Signed)
Patient here for follow up. Pt reports that he has headaches  since starting Calquence.

## 2022-06-29 NOTE — Assessment & Plan Note (Signed)
Recommend smoke cessation.  

## 2022-07-02 ENCOUNTER — Telehealth: Payer: Self-pay

## 2022-07-02 ENCOUNTER — Other Ambulatory Visit (HOSPITAL_COMMUNITY): Payer: Self-pay

## 2022-07-02 NOTE — Telephone Encounter (Signed)
Oral Oncology Patient Advocate Encounter  Was successful in securing patient a $8,000.00 grant from [facility] to provide copayment coverage for Calquence.  This will keep the out of pocket expense at $0.     [employer] ID: [id number]  I have spoken with the patient.   The billing information is as follows and has been shared with [facility].   RxBin: [id number] PCN: [id number] Member ID: [id number] Group ID: [id number] Dates of Eligibility: 06/02/22 through 06/02/23  Fund:  [place] Patient Advocate Specialist [place] Patient Advocate Team Direct Number: [phone]   Fax: [phone]

## 2022-07-06 IMAGING — US US BIOPSY LYMPH NODE
1 series · 11 of 11 positions shown · non-contrast
Comparison: PET-CT, 05/28/2022.

INDICATION: CLL.

EXAM:
ULTRASOUND-GUIDED LEFT INGUINAL LYMPH NODE BIOPSY
TECHNIQUE: Informed written consent was obtained from the patient and/or
patient's representative after a discussion of the risks, benefits
and alternatives to treatment. Questions regarding the procedure
were encouraged and answered. Initial ultrasound scanning
demonstrated pathologically-enlarged LEFT inguinal lymph nodes. An
ultrasound image was saved for documentation purposes. The procedure
was planned. A timeout was performed prior to the initiation of the
procedure.

[Series 1: us core biopsy (lymph nodes) · 11 acquisitions, 11 frames shown]
[im 1/11]
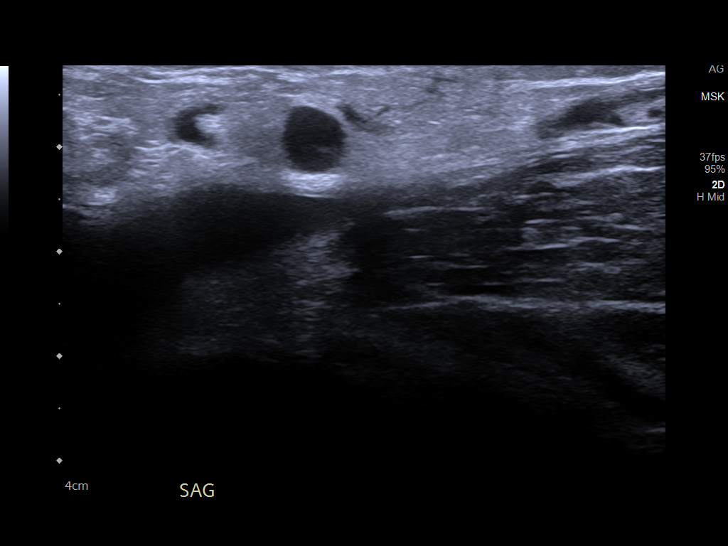
[im 2/11]
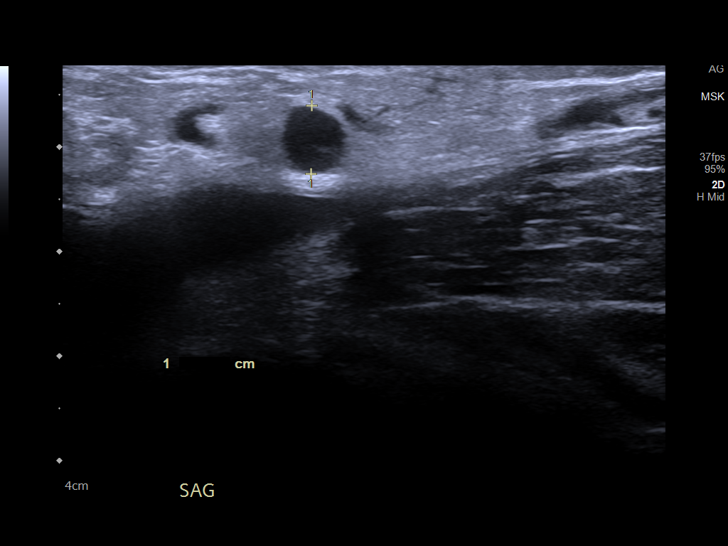
[im 3/11]
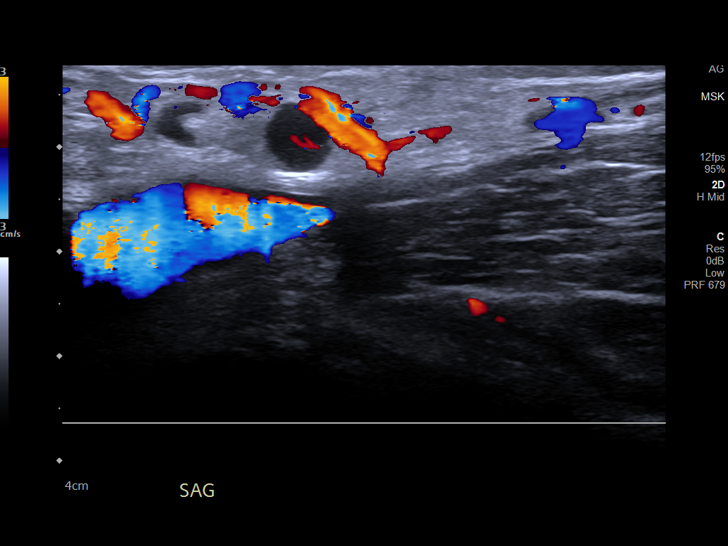
[im 4/11]
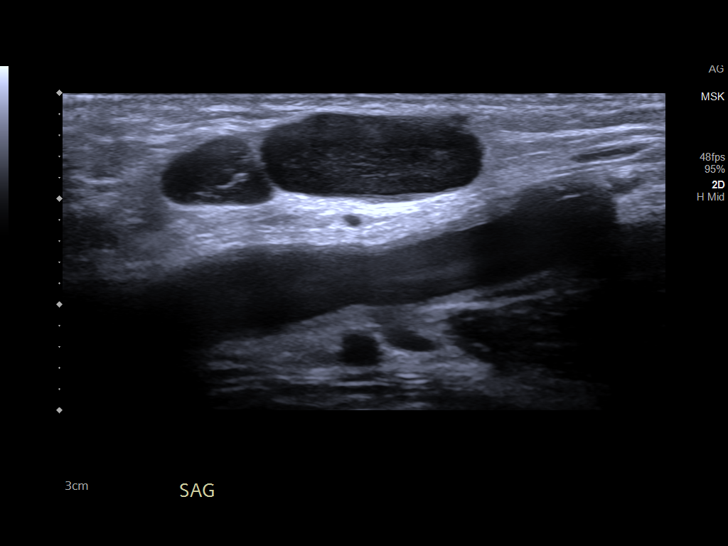
[im 5/11]
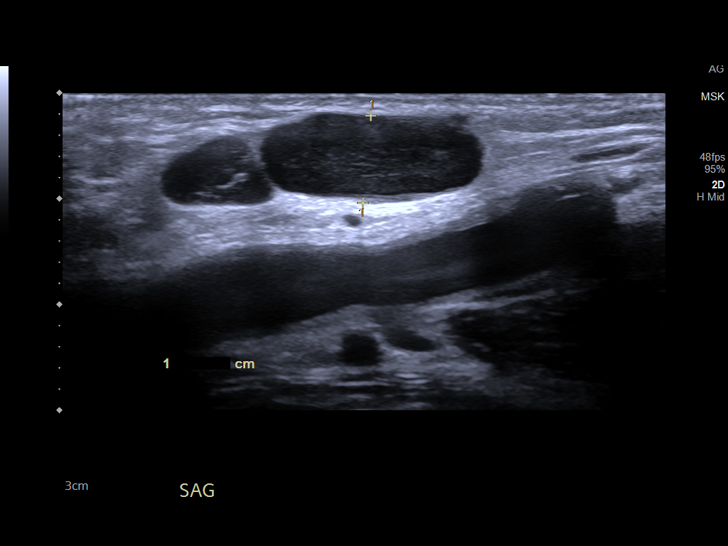
[im 6/11]
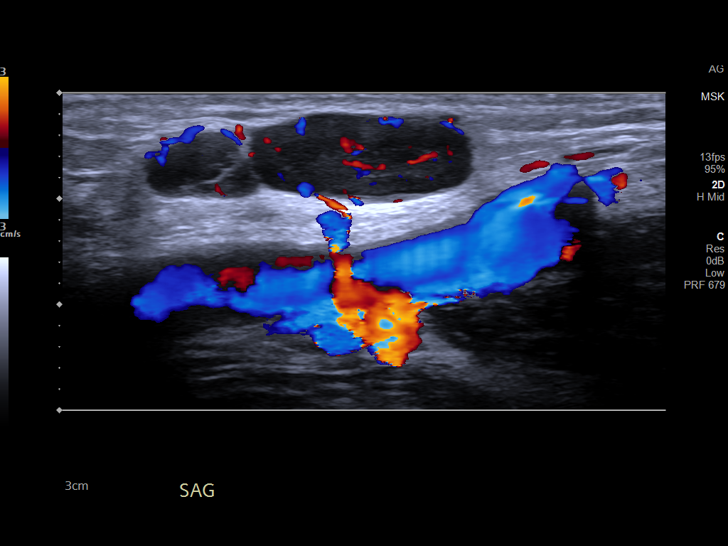
[im 7/11]
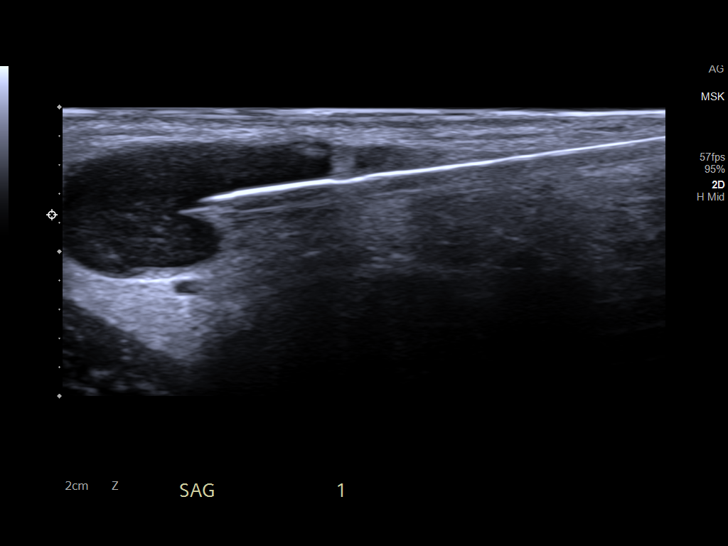
[im 8/11]
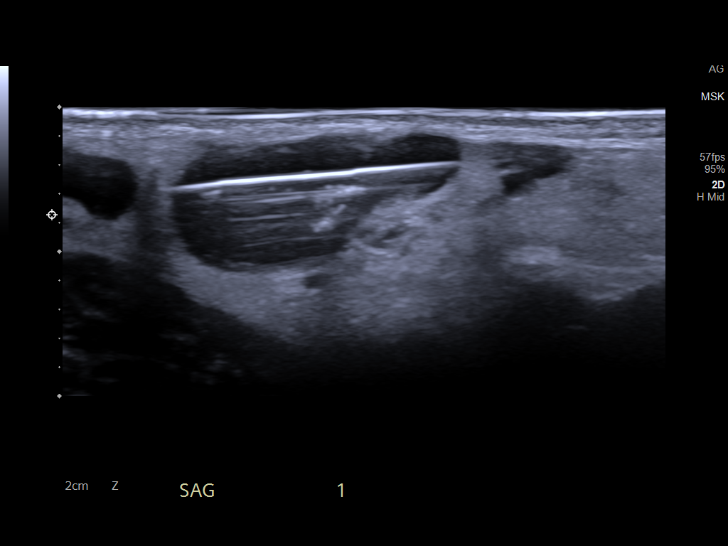
[im 9/11]
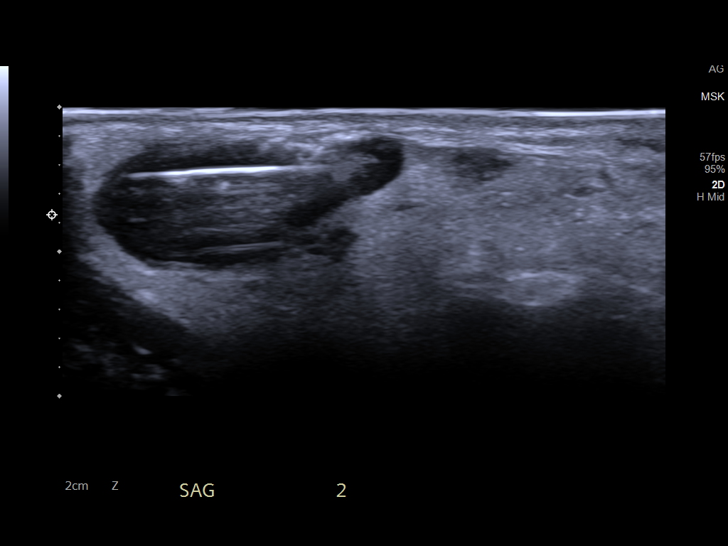
[im 10/11]
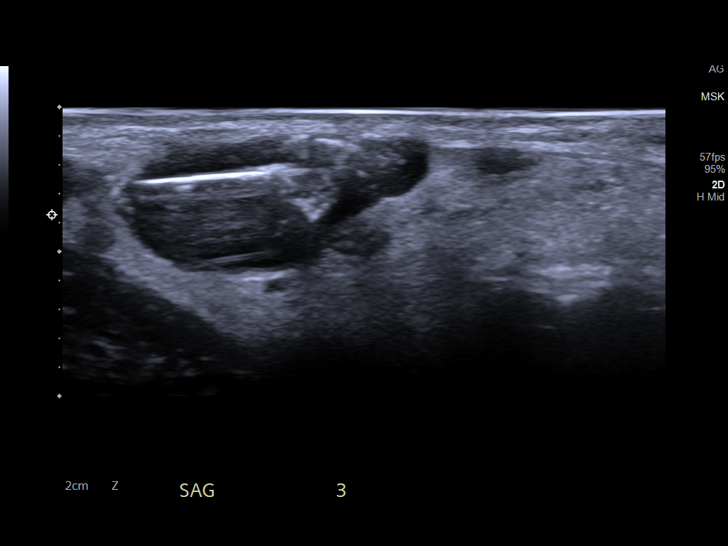
[im 11/11]
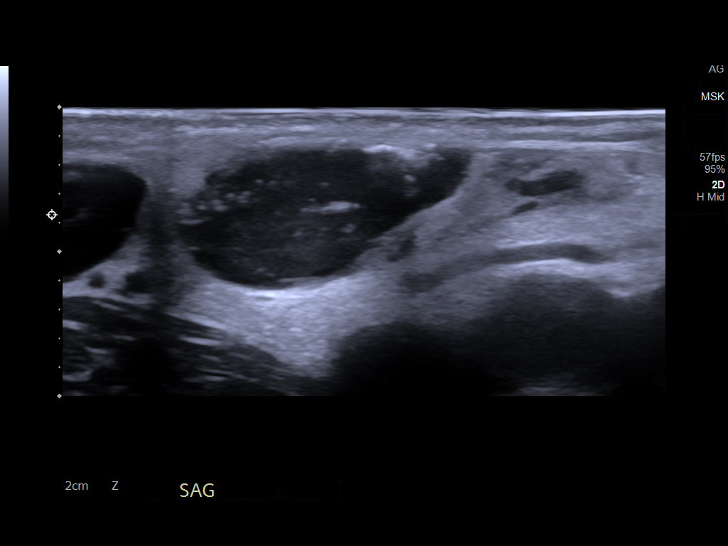

[11 of 11 positions shown; findings below may reference images not displayed]

CT CAP, 05/08/2022.

MEDICATIONS:
None

ANESTHESIA/SEDATION:
Local anesthetic was administered.

COMPLICATIONS:
None immediate.
The operative was prepped and draped in the usual sterile fashion,
and a sterile drape was applied covering the operative field. A
timeout was performed prior to the initiation of the procedure.
Local anesthesia was provided with 1% lidocaine.

Under direct ultrasound guidance, an 18 gauge core needle device was
utilized to obtain to obtain 3 core needle biopsies of the LEFT
inguinal lymph node.

The samples were placed in saline and submitted to pathology. The
needle was removed and hemostasis was achieved with manual
compression. Post procedure scan was negative for significant
hematoma. A dressing was placed. The patient tolerated the procedure
well without immediate postprocedural complication.
IMPRESSION: Successful ultrasound guided biopsy of the enlarged LEFT inguinal
lymph node, as above.

## 2022-07-08 ENCOUNTER — Other Ambulatory Visit (HOSPITAL_COMMUNITY): Payer: Self-pay

## 2022-07-10 ENCOUNTER — Other Ambulatory Visit (HOSPITAL_COMMUNITY): Payer: Self-pay

## 2022-07-11 ENCOUNTER — Emergency Department: Payer: Medicare HMO

## 2022-07-11 ENCOUNTER — Inpatient Hospital Stay
Admission: EM | Admit: 2022-07-11 | Discharge: 2022-07-13 | DRG: 872 | Disposition: A | Discharge status: Home Health | Payer: Medicare HMO | Attending: Internal Medicine | Admitting: Internal Medicine

## 2022-07-11 ENCOUNTER — Other Ambulatory Visit: Payer: Self-pay

## 2022-07-11 DIAGNOSIS — Z880 Allergy status to penicillin: Secondary | ICD-10-CM | POA: Diagnosis not present

## 2022-07-11 DIAGNOSIS — G629 Polyneuropathy, unspecified: Secondary | ICD-10-CM | POA: Diagnosis present

## 2022-07-11 DIAGNOSIS — Z801 Family history of malignant neoplasm of trachea, bronchus and lung: Secondary | ICD-10-CM

## 2022-07-11 DIAGNOSIS — E860 Dehydration: Secondary | ICD-10-CM | POA: Diagnosis not present

## 2022-07-11 DIAGNOSIS — D62 Acute posthemorrhagic anemia: Secondary | ICD-10-CM | POA: Diagnosis present

## 2022-07-11 DIAGNOSIS — T451X5A Adverse effect of antineoplastic and immunosuppressive drugs, initial encounter: Secondary | ICD-10-CM | POA: Diagnosis present

## 2022-07-11 DIAGNOSIS — R112 Nausea with vomiting, unspecified: Secondary | ICD-10-CM

## 2022-07-11 DIAGNOSIS — R0600 Dyspnea, unspecified: Principal | ICD-10-CM

## 2022-07-11 DIAGNOSIS — Z7982 Long term (current) use of aspirin: Secondary | ICD-10-CM | POA: Diagnosis not present

## 2022-07-11 DIAGNOSIS — D630 Anemia in neoplastic disease: Secondary | ICD-10-CM | POA: Diagnosis present

## 2022-07-11 DIAGNOSIS — Z8249 Family history of ischemic heart disease and other diseases of the circulatory system: Secondary | ICD-10-CM | POA: Diagnosis not present

## 2022-07-11 DIAGNOSIS — I739 Peripheral vascular disease, unspecified: Secondary | ICD-10-CM | POA: Diagnosis present

## 2022-07-11 DIAGNOSIS — K6389 Other specified diseases of intestine: Secondary | ICD-10-CM | POA: Diagnosis not present

## 2022-07-11 DIAGNOSIS — K521 Toxic gastroenteritis and colitis: Secondary | ICD-10-CM | POA: Diagnosis present

## 2022-07-11 DIAGNOSIS — E871 Hypo-osmolality and hyponatremia: Secondary | ICD-10-CM | POA: Diagnosis not present

## 2022-07-11 DIAGNOSIS — N179 Acute kidney failure, unspecified: Secondary | ICD-10-CM | POA: Diagnosis not present

## 2022-07-11 DIAGNOSIS — A4151 Sepsis due to Escherichia coli [E. coli]: Secondary | ICD-10-CM | POA: Diagnosis not present

## 2022-07-11 DIAGNOSIS — Z20822 Contact with and (suspected) exposure to covid-19: Secondary | ICD-10-CM | POA: Diagnosis present

## 2022-07-11 DIAGNOSIS — R6520 Severe sepsis without septic shock: Secondary | ICD-10-CM | POA: Diagnosis not present

## 2022-07-11 DIAGNOSIS — Z79899 Other long term (current) drug therapy: Secondary | ICD-10-CM | POA: Diagnosis not present

## 2022-07-11 DIAGNOSIS — R197 Diarrhea, unspecified: Secondary | ICD-10-CM | POA: Diagnosis present

## 2022-07-11 DIAGNOSIS — N390 Urinary tract infection, site not specified: Secondary | ICD-10-CM | POA: Diagnosis present

## 2022-07-11 DIAGNOSIS — E785 Hyperlipidemia, unspecified: Secondary | ICD-10-CM | POA: Diagnosis present

## 2022-07-11 DIAGNOSIS — Z87891 Personal history of nicotine dependence: Secondary | ICD-10-CM | POA: Diagnosis not present

## 2022-07-11 DIAGNOSIS — N182 Chronic kidney disease, stage 2 (mild): Secondary | ICD-10-CM | POA: Diagnosis present

## 2022-07-11 DIAGNOSIS — R0602 Shortness of breath: Secondary | ICD-10-CM | POA: Diagnosis not present

## 2022-07-11 DIAGNOSIS — C9110 Chronic lymphocytic leukemia of B-cell type not having achieved remission: Secondary | ICD-10-CM | POA: Diagnosis present

## 2022-07-11 DIAGNOSIS — D649 Anemia, unspecified: Secondary | ICD-10-CM | POA: Diagnosis not present

## 2022-07-11 DIAGNOSIS — G4700 Insomnia, unspecified: Secondary | ICD-10-CM | POA: Diagnosis present

## 2022-07-11 DIAGNOSIS — K219 Gastro-esophageal reflux disease without esophagitis: Secondary | ICD-10-CM | POA: Diagnosis present

## 2022-07-11 DIAGNOSIS — N189 Chronic kidney disease, unspecified: Secondary | ICD-10-CM | POA: Diagnosis not present

## 2022-07-11 DIAGNOSIS — D638 Anemia in other chronic diseases classified elsewhere: Secondary | ICD-10-CM | POA: Diagnosis not present

## 2022-07-11 DIAGNOSIS — E872 Acidosis, unspecified: Secondary | ICD-10-CM | POA: Diagnosis not present

## 2022-07-11 DIAGNOSIS — A419 Sepsis, unspecified organism: Secondary | ICD-10-CM | POA: Diagnosis not present

## 2022-07-11 LAB — RETICULOCYTES
Immature Retic Fract: 21 % — ABNORMAL HIGH (ref 2.3–15.9)
RBC.: 2.37 MIL/uL — ABNORMAL LOW (ref 4.22–5.81)
Retic Count, Absolute: 31 10*3/uL (ref 19.0–186.0)
Retic Ct Pct: 1.3 % (ref 0.4–3.1)

## 2022-07-11 LAB — BLOOD CULTURE ID PANEL (REFLEXED) - BCID2

## 2022-07-11 LAB — RESP PANEL BY RT-PCR (FLU A&B, COVID) ARPGX2
Influenza A by PCR: NEGATIVE
Influenza B by PCR: NEGATIVE
SARS Coronavirus 2 by RT PCR: NEGATIVE

## 2022-07-11 LAB — C DIFFICILE QUICK SCREEN W PCR REFLEX
C Diff antigen: NEGATIVE
C Diff interpretation: NOT DETECTED
C Diff toxin: NEGATIVE

## 2022-07-11 LAB — LACTATE DEHYDROGENASE: LDH: 135 U/L (ref 98–192)

## 2022-07-11 LAB — COMPREHENSIVE METABOLIC PANEL
ALT: 20 U/L (ref 0–44)
AST: 32 U/L (ref 15–41)
Albumin: 3.2 g/dL — ABNORMAL LOW (ref 3.5–5.0)
Alkaline Phosphatase: 118 U/L (ref 38–126)
Anion gap: 10 (ref 5–15)
BUN: 27 mg/dL — ABNORMAL HIGH (ref 8–23)
CO2: 19 mmol/L — ABNORMAL LOW (ref 22–32)
Calcium: 8.8 mg/dL — ABNORMAL LOW (ref 8.9–10.3)
Chloride: 107 mmol/L (ref 98–111)
Creatinine, Ser: 1.65 mg/dL — ABNORMAL HIGH (ref 0.61–1.24)
GFR, Estimated: 45 mL/min — ABNORMAL LOW (ref >60)
Glucose, Bld: 103 mg/dL — ABNORMAL HIGH (ref 70–99)
Potassium: 3.8 mmol/L (ref 3.5–5.1)
Sodium: 136 mmol/L (ref 135–145)
Total Bilirubin: 0.9 mg/dL (ref 0.3–1.2)
Total Protein: 6.9 g/dL (ref 6.5–8.1)

## 2022-07-11 LAB — GASTROINTESTINAL PANEL BY PCR, STOOL (REPLACES STOOL CULTURE)

## 2022-07-11 LAB — TROPONIN I (HIGH SENSITIVITY)
Troponin I (High Sensitivity): 13 ng/L (ref <18)
Troponin I (High Sensitivity): 11 ng/L (ref <18)

## 2022-07-11 LAB — CBC
HCT: 26.4 % — ABNORMAL LOW (ref 39.0–52.0)
Hemoglobin: 7.3 g/dL — ABNORMAL LOW (ref 13.0–17.0)
MCH: 25.3 pg — ABNORMAL LOW (ref 26.0–34.0)
MCHC: 27.7 g/dL — ABNORMAL LOW (ref 30.0–36.0)
MCV: 91.3 fL (ref 80.0–100.0)
Platelets: 257 10*3/uL (ref 150–400)
RBC: 2.89 MIL/uL — ABNORMAL LOW (ref 4.22–5.81)
RDW: 16.7 % — ABNORMAL HIGH (ref 11.5–15.5)
WBC: 145.3 10*3/uL (ref 4.0–10.5)
nRBC: 0 % (ref 0.0–0.2)

## 2022-07-11 LAB — CBC WITH DIFFERENTIAL/PLATELET
Abs Immature Granulocytes: 0.34 10*3/uL — ABNORMAL HIGH (ref 0.00–0.07)
Basophils Absolute: 0 10*3/uL (ref 0.0–0.1)
Basophils Relative: 0 %
Eosinophils Absolute: 0 10*3/uL (ref 0.0–0.5)
Eosinophils Relative: 0 %
HCT: 25.8 % — ABNORMAL LOW (ref 39.0–52.0)
Hemoglobin: 7.2 g/dL — ABNORMAL LOW (ref 13.0–17.0)
Immature Granulocytes: 0 %
Lymphocytes Relative: 96 %
Lymphs Abs: 137.7 10*3/uL — ABNORMAL HIGH (ref 0.7–4.0)
MCH: 25.3 pg — ABNORMAL LOW (ref 26.0–34.0)
MCHC: 27.9 g/dL — ABNORMAL LOW (ref 30.0–36.0)
MCV: 90.5 fL (ref 80.0–100.0)
Monocytes Absolute: 0.9 10*3/uL (ref 0.1–1.0)
Monocytes Relative: 1 %
Neutro Abs: 3.7 10*3/uL (ref 1.7–7.7)
Neutrophils Relative %: 3 %
Platelets: 258 10*3/uL (ref 150–400)
RBC: 2.85 MIL/uL — ABNORMAL LOW (ref 4.22–5.81)
RDW: 16.8 % — ABNORMAL HIGH (ref 11.5–15.5)
Smear Review: NORMAL
WBC: 143.7 10*3/uL (ref 4.0–10.5)
nRBC: 0 % (ref 0.0–0.2)

## 2022-07-11 LAB — URINALYSIS, ROUTINE W REFLEX MICROSCOPIC
Bilirubin Urine: NEGATIVE
Glucose, UA: NEGATIVE mg/dL
Hgb urine dipstick: NEGATIVE
Ketones, ur: NEGATIVE mg/dL
Nitrite: NEGATIVE
Protein, ur: 30 mg/dL — AB
Specific Gravity, Urine: 1.02 (ref 1.005–1.030)
pH: 5 (ref 5.0–8.0)

## 2022-07-11 LAB — DAT, POLYSPECIFIC AHG (ARMC ONLY): Polyspecific AHG test: NEGATIVE

## 2022-07-11 LAB — LACTIC ACID, PLASMA
Lactic Acid, Venous: 3.7 mmol/L (ref 0.5–1.9)
Lactic Acid, Venous: 0.8 mmol/L (ref 0.5–1.9)

## 2022-07-11 LAB — PROCALCITONIN: Procalcitonin: 14.36 ng/mL

## 2022-07-11 LAB — PREPARE RBC (CROSSMATCH)

## 2022-07-11 LAB — HEMOGLOBIN AND HEMATOCRIT, BLOOD
HCT: 21.9 % — ABNORMAL LOW (ref 39.0–52.0)
Hemoglobin: 6.5 g/dL — ABNORMAL LOW (ref 13.0–17.0)

## 2022-07-11 LAB — LIPASE, BLOOD: Lipase: 27 U/L (ref 11–51)

## 2022-07-11 MED ORDER — LACTATED RINGERS IV SOLN
INTRAVENOUS | Status: AC
Start: 2022-07-11 — End: 2022-07-13
  Administered 2022-07-11: 75 mL/h via INTRAVENOUS

## 2022-07-11 MED ORDER — SODIUM CHLORIDE 0.9 % IV SOLN: 10.0000 mL/h | Freq: Once | INTRAVENOUS | Status: DC

## 2022-07-11 MED ORDER — IOHEXOL 300 MG/ML  SOLN
100.0000 mL | Freq: Once | INTRAMUSCULAR | Status: AC | PRN
  Administered 2022-07-11: 100 mL via INTRAVENOUS

## 2022-07-11 MED ORDER — LEVOFLOXACIN IN D5W 500 MG/100ML IV SOLN
500.0000 mg | Freq: Once | INTRAVENOUS | Status: AC
  Administered 2022-07-11: 500 mg via INTRAVENOUS
  Filled 2022-07-11: qty 100

## 2022-07-11 MED ORDER — OXYCODONE HCL 5 MG PO TABS
5.0000 mg | ORAL_TABLET | Freq: Four times a day (QID) | ORAL | Status: DC | PRN
  Administered 2022-07-11: 5 mg via ORAL
  Filled 2022-07-11: qty 1

## 2022-07-11 MED ORDER — LEVOFLOXACIN IN D5W 750 MG/150ML IV SOLN
750.0000 mg | INTRAVENOUS | Status: DC
  Administered 2022-07-12: 750 mg via INTRAVENOUS
  Filled 2022-07-11: qty 150

## 2022-07-11 MED ORDER — MELATONIN 5 MG PO TABS
5.0000 mg | ORAL_TABLET | Freq: Every evening | ORAL | Status: DC | PRN
Start: 2022-07-11 — End: 2022-07-13
  Administered 2022-07-11: 5 mg via ORAL
  Filled 2022-07-11: qty 1

## 2022-07-11 MED ORDER — ONDANSETRON HCL 4 MG/2ML IJ SOLN
4.0000 mg | Freq: Four times a day (QID) | INTRAMUSCULAR | Status: DC | PRN
Start: 2022-07-11 — End: 2022-07-13
  Administered 2022-07-11: 4 mg via INTRAVENOUS
  Filled 2022-07-11: qty 2

## 2022-07-11 MED ORDER — LACTATED RINGERS IV BOLUS
1000.0000 mL | Freq: Once | INTRAVENOUS | Status: AC
  Administered 2022-07-11: 1000 mL via INTRAVENOUS

## 2022-07-11 MED ORDER — SODIUM CHLORIDE 0.9% IV SOLUTION
Freq: Once | INTRAVENOUS | Status: AC
Start: 2022-07-11 — End: 2022-07-11
  Filled 2022-07-11: qty 250

## 2022-07-11 MED ORDER — TRAZODONE HCL 50 MG PO TABS
50.0000 mg | ORAL_TABLET | Freq: Every evening | ORAL | Status: DC | PRN
Start: 2022-07-11 — End: 2022-07-13

## 2022-07-11 MED ORDER — LEVOFLOXACIN IN D5W 250 MG/50ML IV SOLN: 250.0000 mg | INTRAVENOUS | Status: DC

## 2022-07-11 MED ORDER — ONDANSETRON HCL 4 MG/2ML IJ SOLN
4.0000 mg | Freq: Once | INTRAMUSCULAR | Status: AC | PRN
  Administered 2022-07-11: 4 mg via INTRAVENOUS
  Filled 2022-07-11: qty 2

## 2022-07-11 MED ORDER — ACETAMINOPHEN 325 MG PO TABS
650.0000 mg | ORAL_TABLET | Freq: Four times a day (QID) | ORAL | Status: DC | PRN
  Administered 2022-07-11 – 2022-07-13 (×5): 650 mg via ORAL
  Filled 2022-07-11 (×5): qty 2

## 2022-07-11 NOTE — ED Triage Notes (Signed)
To triage via wheelchair with c/o shortness of breath and chest x 2 days. Sx worsened tonight. No home 02 use. Pt reports difficulty taking deep breaths. Currently taking oral chemo for Leukemia and has had N/V/D since he started that medication apx 2 weeks ago.

## 2022-07-11 NOTE — H&P (Addendum)
History and Physical  [name] VOH:[id number] DOB: 05-06-1951 DOA: 07/11/2022  Referring physician: Dr. Cinda Quest, [place]. PCP: [name], FNP  Outpatient Specialists: Medical oncology. Patient coming from: Home.  Chief Complaint: Nausea, vomiting, watery diarrhea, shaking chills, dysuria, bilateral flank pain, bilateral leg weakness for the past 2 days.  HPI: [name] is a 71 y.o. male with medical history significant for CLL, history of hemolytic anemia in 2015 treated with prednisone, unspecified arthritis, hyperlipidemia, chronic lower back pain, PVD, former tobacco user, quit more than 20 days ago, medication noncompliance, who presented to [facility] ED from home due to nausea, vomiting, diarrhea, shaking chills, dysuria, bilateral flank pain, and bilateral leg weakness, for the past 2 days.  Admits to subjective fevers and dark watery stools prior to his presentation to the ED.  Denies abdominal pain.  No hematemesis or hematochezia.  He is currently on immunotherapy, started 20 days ago, last dose was taken last night.    Today he felt very weak and his legs gave out which prompted his visit to the ED for further evaluation.  EDP discussed the case with medical oncology on-call Dr. Janese Banks who recommended to hold off home immunotherapy for now and to treat underlying conditions.    Work-up in the ED revealed acute blood loss anemia with hemoglobin of 7.2 from baseline of 10, dehydration, prerenal AKI with creatinine of 1.65 from baseline of 1.26, lactic acidosis 3.7, resolved after IV fluid boluses, LR x2.    UA, LDH, haptoglobin, peripheral smear are in process.  The patient received 2 L IV fluid boluses LR, 1 dose of IV Zofran 4 mg x 1, IV Levaquin 500 mg x1 for presumptive UTI.  TRH, hospitalist service, was asked to admit.  ED Course: Tmax 99.8.  BP 121/73, pulse 85, respiratory 20, O2 saturation 100% on room air.  Lab studies remarkable for WBC 143.7 hemoglobin 7.2 from 9.4, 12 days  ago, platelet count normal 258, lymphocyte count 137, serum bicarb 19, BUN 27, creatinine 1.65, GFR 45.  Lactic acid 3.7, then 0.8.  Review of Systems: Review of systems as noted in the HPI. All other systems reviewed and are negative.   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 Tasia Catchings   Hyperlipidemia    Low back pain    Peripheral vascular disease [facility])    Past Surgical History:  Procedure Laterality Date   COLONOSCOPY     COLONOSCOPY WITH PROPOFOL N/A 07/23/2015   Procedure: COLONOSCOPY WITH PROPOFOL;  Surgeon: [name], MD;  Location: [facility] ENDOSCOPY;  Service: Endoscopy;  Laterality: N/A;   COLONOSCOPY WITH PROPOFOL N/A 04/20/2022   Procedure: COLONOSCOPY WITH PROPOFOL;  Surgeon: Jonathon Bellows, MD;  Location: [facility] ENDOSCOPY;  Service: Gastroenterology;  Laterality: N/A;   ESOPHAGOGASTRODUODENOSCOPY (EGD) WITH PROPOFOL N/A 08/20/2015   Procedure: ESOPHAGOGASTRODUODENOSCOPY (EGD) WITH PROPOFOL;  Surgeon: [name], MD;  Location: [facility] ENDOSCOPY;  Service: Endoscopy;  Laterality: N/A;   LOWER EXTREMITY ANGIOGRAPHY Left 05/19/2018   Procedure: LOWER EXTREMITY ANGIOGRAPHY;  Surgeon: Algernon Huxley, MD;  Location: [place] CV LAB;  Service: Cardiovascular;  Laterality: Left;   LOWER EXTREMITY ANGIOGRAPHY Right 06/15/2018   Procedure: LOWER EXTREMITY ANGIOGRAPHY;  Surgeon: Algernon Huxley, MD;  Location: [place] CV LAB;  Service: Cardiovascular;  Laterality: Right;   lymp node removal Right 2011   arm   left arm  2015   LYMPH NODE BIOPSY  08/2014    Social History:  reports that he has quit smoking. His smoking use included cigars. He has never used smokeless tobacco. He reports that he does not drink alcohol and does not use drugs.   Allergies  Allergen Reactions   Penicillins     Other reaction(s): Rash, Hives ()   Latex Rash    Family History  Problem Relation  Age of Onset   Heart attack Father    Kidney cancer Neg Hx        lung cancer   Bladder Cancer Neg Hx    Prostate cancer Neg Hx       Prior to Admission medications   Medication Sig Start Date End Date Taking? Authorizing Provider  acalabrutinib maleate (CALQUENCE) 100 MG TABS Take 1 tablet by mouth 2 (two) times daily. 06/15/22   Earlie Server, MD  aspirin EC 81 MG tablet Take 81 mg by mouth daily. Swallow whole. 05/01/21   Minna Merritts, MD  gabapentin (NEURONTIN) 300 MG capsule Take by mouth. 09/02/17   [provider]  hydrocortisone cream 1 % Apply 1 application. topically 2 (two) times daily. 04/29/22   [name], FNP  loperamide (IMODIUM) 2 MG capsule Take 1 capsule (2 mg total) by mouth See admin instructions. Initial: 4 mg, followed by 2 mg after each loose stool; maximum: 16 mg/day 06/29/22   Earlie Server, MD  pantoprazole (PROTONIX) 20 MG tablet Take 1 tablet (20 mg total) by mouth daily. Take 1 hour prior to breakfast 04/06/22   [name], FNP  pravastatin (PRAVACHOL) 80 MG tablet Take 1 tablet (80 mg total) by mouth daily. 01/16/22   [name], FNP  traZODone (DESYREL) 50 MG tablet Take 0.5-1 tablets (25-50 mg total) by mouth at bedtime as needed for sleep. 04/06/22   [name], FNP    Physical Exam: BP 121/73 (BP Location: Left Arm)   Pulse 85   Temp 99.8 F (37.7 C) (Oral)   Resp 20   Ht '5\' 8"'$  (1.727 m)   Wt 56.7 kg   SpO2 100%   BMI 19.01 kg/m   General: 71 y.o. year-old male frail-appearing in no acute distress.  Alert and oriented x3.  Hard of hearing. Cardiovascular: Regular rate and rhythm with no rubs or gallops.  No thyromegaly or JVD noted.  No lower extremity edema. 2/4 pulses in all 4 extremities. Respiratory: Clear to auscultation with no wheezes or rales. Good inspiratory effort. Abdomen: Soft nontender nondistended with normal bowel sounds x4 quadrants. Muskuloskeletal: No cyanosis, clubbing or edema noted  bilaterally Neuro: CN II-XII intact, strength, sensation, reflexes Skin: No ulcerative lesions noted or rashes Psychiatry: Judgement and insight appear normal. Mood is appropriate for condition and setting          Labs on Admission:  Basic Metabolic Panel: Recent Labs  Lab 07/11/22 0551  NA 136  K 3.8  CL 107  CO2 19*  GLUCOSE 103*  BUN 27*  CREATININE 1.65*  CALCIUM 8.8*   Liver Function Tests: Recent Labs  Lab 07/11/22 0551  AST 32  ALT 20  ALKPHOS 118  BILITOT 0.9  PROT 6.9  ALBUMIN 3.2*   Recent Labs  Lab 07/11/22 0551  LIPASE 27   No results for input(s): "AMMONIA" in the last 168 hours. CBC: Recent Labs  Lab 07/11/22 0551  WBC 143.7*  145.3*  NEUTROABS 3.7  HGB 7.2*  7.3*  HCT 25.8*  26.4*  MCV 90.5  91.3  PLT 258  257   Cardiac Enzymes: No results for input(s): "CKTOTAL", "CKMB", "CKMBINDEX", "TROPONINI" in the last 168 hours.  BNP (last 3 results) No results for input(s): "BNP" in the last 8760 hours.  ProBNP (last 3 results) No results for input(s): "PROBNP" in the last 8760 hours.  CBG: No results for input(s): "GLUCAP" in the last 168 hours.  Radiological Exams on Admission: CT ABDOMEN PELVIS W CONTRAST  Result Date: 07/11/2022 CLINICAL DATA:  Abdominal pain with nausea, vomiting and diarrhea. Neutropenic. History of CLL. Currently on chemotherapy. EXAM: CT ABDOMEN AND PELVIS WITH CONTRAST TECHNIQUE: Multidetector CT imaging of the abdomen and pelvis was performed using the standard protocol following bolus administration of intravenous contrast. RADIATION DOSE REDUCTION: This exam was performed according to the departmental dose-optimization program which includes automated exposure control, adjustment of the mA and/or kV according to patient size and/or use of iterative reconstruction technique. CONTRAST:  137m OMNIPAQUE IOHEXOL 300 MG/ML  SOLN COMPARISON:  05/08/2022 FINDINGS: Lower chest: Lung bases are clear. Hepatobiliary:  Gallbladder is somewhat contracted. Liver and biliary tree are normal. Pancreas: Normal. Spleen: Normal. Adrenals/Urinary Tract: Adrenal glands are normal. Kidneys are normal in size without hydronephrosis or nephrolithiasis. Possible small cyst over the upper pole left kidney unchanged. Bladder is normal. Stomach/Bowel: Stomach and small bowel are normal. Appendix not well visualized. Colon is decompressed distal to the splenic flexure. Vascular/Lymphatic: Mild calcified plaque over the abdominal aorta which is normal in caliber. Stent unchanged over the right femoral artery. Known significant bulky adenopathy throughout the abdomen/mesentery and retroperitoneum with mild to moderate overall interval improvement. Improved external iliac chain adenopathy. Reproductive: Unremarkable. Other: Small amount of free fluid over the posterior pelvis which is new. Musculoskeletal: No acute findings. IMPRESSION: 1. No acute findings in the abdomen/pelvis. 2. Known significant bulky adenopathy throughout the abdomen/mesentery and retroperitoneum with mild to moderate overall improvement. Improved external iliac chain adenopathy. Findings compatible with known CLL and ongoing chemotherapy. 3. Small amount of nonspecific free fluid over the posterior pelvis which is new. 4. Aortic atherosclerosis. Aortic Atherosclerosis (ICD10-I70.0). Electronically Signed   By: DMarin OlpM.D.   On: 07/11/2022 09:05   DG Chest 2 View  Result Date: 07/11/2022 CLINICAL DATA:  Shortness of breath EXAM: CHEST - 2 VIEW COMPARISON:  06/15/2022 and prior studies FINDINGS: The cardiomediastinal silhouette is unremarkable. There is no evidence of focal airspace disease, pulmonary edema, pleural effusion, or pneumothorax. No acute bony abnormalities are identified. Surgical material in the RIGHT axillary region noted. Little interval change since the prior study. IMPRESSION: No active cardiopulmonary disease. Electronically Signed   By: JMargarette CanadaM.D.   On: 07/11/2022 06:39    EKG: I independently viewed the EKG done and my findings are as followed: Atrial flutter with rate of 81.  Nonspecific ST-T changes.  QTc 392.  Assessment/Plan Present on Admission:  AK[place] kidney [place]  Principal Problem:   AK[place]  AKI, prerenal in the setting of dehydration from GI losses, nausea vomiting and diarrhea Baseline creatinine appears to be 1.2 with GFR greater than 60 Presented with creatinine of 1.65 with GFR of 45. Received 2 L of IV fluid boluses in the ED LR Continue maintenance IV fluid hydration LR at 75 cc per hour x2 days Closely monitor urine output with strict I's and O's Avoid nephrotoxic agents, dehydration and hypotension. Repeat renal panel in the morning.   Acute diarrhea, rule out  active infective process Reported watery stools for the past 2 days Obtain C. difficile PCR and GI panel PCR, follow results  Acute blood loss anemia, unclear etiology, rule out hemolytic anemia Work-up for hemolytic anemia in process, peripheral smear, LDH, haptoglobin Presented with hemoglobin of 7.2 with baseline hemoglobin of 10 No overt bleeding, denies hematemesis, melena or hematochezia Obtain FOBT 2 units PRBC ordered to be transfused by EDP Has had an EGD in the past, in 2016, and it was normal. Consider GI consult in the morning  Non anion gap metabolic acidosis in the setting of renal insufficiency, lactic acidosis Presented with serum bicarb of 19, anion gap of 10 Lactic acid of 3.7 Continue IV fluid hydration Repeat chemistry panel in the morning.  Possible urinary tract infection, POA Presented with dysuria and bilateral flank pain UA ordered by EDP and pending. Started on Levaquin in the ED, continue for now Monitor UA and urine culture  Lactic acidosis, improved Presented with lactic acid of 3.7 Improved with IV fluid boluses Continue IV antibiotics until active infective  process is ruled out Continue IV fluid hydration Follow urine culture and blood cultures x2 peripherally Monitor fever curve and WBC  CLL with anemia, rule out hemolytic anemia Peripheral smear, LDH, haptoglobin are in process Medical oncology consulted to assist with the management Hold off home immunotherapy as recommended by medical oncology  Hyperlipidemia The patient stopped taking home pravastatin  Polyneuropathy The patient stopped taking home gabapentin  GERD The patient stopped taking home PPI  Situational insomnia The patient stopped taking home trazodone   Generalized weakness Legs gave out prior to presentation to the ED, no trauma. PT OT assessment Fall precautions      Critical care time: 65 minutes.     DVT prophylaxis: SCDs, pharmacological DVT prophylaxis held due to concern for possible GI bleed or hemolytic anemia.  Code Status: Full code.  Family Communication: Updated his wife at bedside.  Disposition Plan: Admitted to progressive unit  Consults called: Medical oncology  Admission status: Inpatient status.   Status is: Inpatient The patient requires at least 2 midnights for further evaluation and treatment of present condition.   Kayleen Memos MD [facility] Pager [phone]  If 7PM-7AM, please contact night-coverage [email or web address] Password [facility]  07/11/2022, 9:31 AM

## 2022-07-11 NOTE — Progress Notes (Signed)
PHARMACY - PHYSICIAN COMMUNICATION CRITICAL VALUE ALERT - BLOOD CULTURE IDENTIFICATION (BCID)  [name] is an 71 y.o. male who presented to [facility] on 07/11/2022 with a chief complaint of    Nausea, vomiting, watery diarrhea, shaking chills, dysuria, bilateral flank pain, bilateral leg weakness for the past 2 days.  Assessment:  last bottle 4 of 4 is positive for GNR- BCID= E.coli.  Other BCID  results were apparently called to RN -Ucx pending  Name of physician (or Provider) Contacted: hall, c  Current antibiotics: Levaquin '500mg'$  IV q24h  Changes to prescribed antibiotics recommended:  Change Levaquin to '750mg'$  IV Z60F for complicated UTI, bacteremia   Results for orders placed or performed during the hospital encounter of 07/11/22  Blood Culture ID Panel (Reflexed) (Collected: 07/11/2022  5:51 AM)  Result Value Ref Range   Enterococcus faecalis NOT DETECTED NOT DETECTED   Enterococcus Faecium NOT DETECTED NOT DETECTED   Listeria monocytogenes NOT DETECTED NOT DETECTED   Staphylococcus species NOT DETECTED NOT DETECTED   Staphylococcus aureus (BCID) NOT DETECTED NOT DETECTED   Staphylococcus epidermidis NOT DETECTED NOT DETECTED   Staphylococcus lugdunensis NOT DETECTED NOT DETECTED   Streptococcus species NOT DETECTED NOT DETECTED   Streptococcus agalactiae NOT DETECTED NOT DETECTED   Streptococcus pneumoniae NOT DETECTED NOT DETECTED   Streptococcus pyogenes NOT DETECTED NOT DETECTED   A.calcoaceticus-baumannii NOT DETECTED NOT DETECTED   Bacteroides fragilis NOT DETECTED NOT DETECTED   Enterobacterales DETECTED (A) NOT DETECTED   Enterobacter cloacae complex NOT DETECTED NOT DETECTED   Escherichia coli DETECTED (A) NOT DETECTED   Klebsiella aerogenes NOT DETECTED NOT DETECTED   Klebsiella oxytoca NOT DETECTED NOT DETECTED   Klebsiella pneumoniae NOT DETECTED NOT DETECTED   Proteus species NOT DETECTED NOT DETECTED   Salmonella species NOT DETECTED NOT DETECTED    Serratia marcescens NOT DETECTED NOT DETECTED   Haemophilus influenzae NOT DETECTED NOT DETECTED   Neisseria meningitidis NOT DETECTED NOT DETECTED   Pseudomonas aeruginosa NOT DETECTED NOT DETECTED   Stenotrophomonas maltophilia NOT DETECTED NOT DETECTED   Candida albicans NOT DETECTED NOT DETECTED   Candida auris NOT DETECTED NOT DETECTED   Candida glabrata NOT DETECTED NOT DETECTED   Candida krusei NOT DETECTED NOT DETECTED   Candida parapsilosis NOT DETECTED NOT DETECTED   Candida tropicalis NOT DETECTED NOT DETECTED   Cryptococcus neoformans/gattii NOT DETECTED NOT DETECTED   CTX-M ESBL NOT DETECTED NOT DETECTED   Carbapenem resistance IMP NOT DETECTED NOT DETECTED   Carbapenem resistance KPC NOT DETECTED NOT DETECTED   Carbapenem resistance NDM NOT DETECTED NOT DETECTED   Carbapenem resist OXA 48 LIKE NOT DETECTED NOT DETECTED   Carbapenem resistance VIM NOT DETECTED NOT DETECTED    [name] A 07/11/2022  10:41 PM

## 2022-07-11 NOTE — ED Notes (Signed)
MD Hall at bedside

## 2022-07-11 NOTE — Consult Note (Signed)
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 Conni Slipper  Date of visit: 07/12/22   History of presenting illness-patient is a 71 year old male with history of CLL and sees Dr. Tasia Catchings as an outpatient.  He was started on treatment with acalabrutinib for CLL in June 2023.  His white cell count in June was 75 and went up to 152 2 weeks ago and presently 145.  Hemoglobin 9.42 weeks ago and presently 7.3.  Platelet count 136 2 weeks ago and presently 257.  Patient presented with symptoms of nausea vomiting diarrhea shortness of breath shaking chills and burning urination.  Blood cultures positive for E. coli.  Hematology consulted given his high white cell count.  Patient currently reports feeling better.  His appetite is improving.  He had significant headaches when he was admitted but reports that that is improving.  ECOG PS- 1  Pain scale- 0   Review of systems- Review of Systems  Constitutional:  Positive for fever and malaise/fatigue. Negative for chills and weight loss.  HENT:  Negative for congestion, ear discharge and nosebleeds.   Eyes:  Negative for blurred vision.  Respiratory:  Negative for cough, hemoptysis, sputum production, shortness of breath and wheezing.   Cardiovascular:  Negative for chest pain, palpitations, orthopnea and claudication.  Gastrointestinal:  Negative for abdominal pain, blood in stool, constipation, diarrhea, heartburn, melena, nausea and vomiting.  Genitourinary:  Negative for  dysuria, flank pain, frequency, hematuria and urgency.  Musculoskeletal:  Negative for back pain, joint pain and myalgias.  Skin:  Negative for rash.  Neurological:  Positive for headaches. Negative for dizziness, tingling, focal weakness, seizures and weakness.  Endo/Heme/Allergies:  Does not bruise/bleed easily.  Psychiatric/Behavioral:  Negative for depression and suicidal ideas. The patient does not have insomnia.     Allergies  Allergen Reactions   Penicillins     Other reaction(s): Rash, Hives ()   Latex Rash    Patient Active Problem List   Diagnosis Date Noted   AKI (acute kidney injury) ([place]) 07/11/2022   Tobacco abuse 06/29/2022   CKD (chronic kidney disease) stage 2, GFR 60-89 ml/min 06/16/2022   Sinus bradycardia 06/16/2022   Syncope, vasovagal 06/15/2022   Syncope 06/15/2022   Encounter for antineoplastic chemotherapy 05/31/2022   Rectal itching 04/29/2022   Insomnia 04/06/2022   Normocytic anemia 02/19/2022   Goals of care, counseling/discussion 02/19/2022   B12 deficiency 11/18/2021   Bradycardia 04/30/2021   Dizziness 04/30/2021   Left arm swelling 11/26/2019   Corns and callosities 11/23/2019   Corn of toe 11/23/2019   Hav (hallux abducto valgus), unspecified laterality 11/23/2019   Diarrhea 10/23/2019   Pancreatic insufficiency 10/23/2019   Cough 02/03/2019   Erectile dysfunction 07/01/2018   Claudication ([place]) 04/19/2018   Pain in both lower extremities 04/01/2018   History of prediabetes 01/11/2018   Gastroesophageal reflux disease 01/11/2018   Chest pressure 10/28/2017   Depression, recurrent ([place]) 09/29/2017   Encounter for screening for malignant neoplasm of respiratory organs 08/20/2017   Fatigue 07/23/2017   Coronary atherosclerosis 10/14/2016   Emphysema lung (  [place]) 10/14/2016   BPH (benign prostatic hyperplasia) 10/14/2016   Peripheral vascular disease ([place]) 10/14/2016   Hyperlipidemia 10/14/2016   Weight loss 06/21/2016   Beta  thalassemia minor 06/15/2015   Chronic lymphocytic leukemia (CLL), 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 Tasia Catchings   Hyperlipidemia    Low back pain    Peripheral vascular disease [facility])      Past Surgical History:  Procedure Laterality Date   COLONOSCOPY     COLONOSCOPY WITH PROPOFOL N/A 07/23/2015   Procedure: COLONOSCOPY WITH PROPOFOL;  Surgeon: [name], MD;  Location[facility] ENDOSCOPY;  Service: Endoscopy;  Laterality: N/A;   COLONOSCOPY WITH PROPOFOL N/A 04/20/2022   Procedure: COLONOSCOPY WITH PROPOFOL;  Surgeon: Jonathon Bellows, MD;  Location: [facility] ENDOSCOPY;  Service: Gastroenterology;  Laterality: N/A;   ESOPHAGOGASTRODUODENOSCOPY (EGD) WITH PROPOFOL N/A 08/20/2015   Procedure: ESOPHAGOGASTRODUODENOSCOPY (EGD) WITH PROPOFOL;  Surgeon: [name], MD;  Location: [facility] ENDOSCOPY;  Service: Endoscopy;  Laterality: N/A;   LOWER EXTREMITY ANGIOGRAPHY Left 05/19/2018   Procedure: LOWER EXTREMITY ANGIOGRAPHY;  Surgeon: Algernon Huxley, MD;  Location: [place] CV LAB;  Service: Cardiovascular;  Laterality: Left;   LOWER EXTREMITY ANGIOGRAPHY Right 06/15/2018   Procedure: LOWER EXTREMITY ANGIOGRAPHY;  Surgeon: Algernon Huxley, MD;  Location: [place] CV LAB;  Service: Cardiovascular;  Laterality: Right;   lymp node removal Right 2011   arm   left arm 2015   LYMPH NODE BIOPSY  08/2014    Social History   Socioeconomic History   Marital status: Married    Spouse name: Not on file   Number of children: Not on file   Years of education: Not on file   Highest education level: Not on file  Occupational History   Not on file  Tobacco Use   Smoking status: Former    Types: Cigars   Smokeless tobacco: Never   Tobacco  comments:    .5 ppd last year, total of 48.5 pack year  Vaping Use   Vaping Use: Never used  Substance and Sexual Activity   Alcohol use: No   Drug use: No   Sexual activity: Yes  Other Topics Concern   Not on file  Social History Narrative   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    Let go from [facility] 11/2019 unexpectedly   Sons 2, daughters 42   10 grandchildren   Social Determinants of Radio broadcast assistant Strain: Not on file  Food Insecurity: Not on file  Transportation Needs: Not on file  Physical Activity: Not on file  Stress: Not on file  Social Connections: Not on file  Intimate Partner Violence: Not on file     Family History  Problem Relation Age of Onset   Heart attack Father    Kidney cancer Neg Hx        lung cancer   Bladder Cancer Neg Hx    Prostate cancer Neg Hx      Current Facility-Administered Medications:    0.9 %  sodium chloride infusion, 10 mL/hr, Intravenous, Once, Nena Polio, MD   acetaminophen (TYLENOL) tablet 650 mg, 650 mg, Oral, Q6H PRN, Hall, Carole N, DO, 650 mg  at 07/11/22 1957   lactated ringers infusion, , Intravenous, Continuous, Kayleen Memos, DO, Last Rate: 75 mL/hr at 07/11/22 1229, New Bag at 07/11/22 1229   [START ON 07/12/2022] Levofloxacin (LEVAQUIN) IVPB 250 mg, 250 mg, Intravenous, Q24H, Beers, Shanon Brow, RPH   melatonin tablet 5 mg, 5 mg, Oral, QHS PRN, [place], Carole N, DO   ondansetron (ZOFRAN) injection 4 mg, 4 mg, Intravenous, Q6H PRN, Hall, Carole N, DO, 4 mg at 07/11/22 1320   oxyCODONE (Oxy IR/ROXICODONE) immediate release tablet 5 mg, 5 mg, Oral, Q6H PRN, Irene Pap N, DO, 5 mg at 07/11/22 1226  Current Outpatient Medications:    acalabrutinib maleate (CALQUENCE) 100 MG TABS, Take 1 tablet by mouth 2 (two) times daily., Disp: 60 tablet, Rfl: 0   aspirin EC 81 MG tablet, Take 81 mg by mouth daily. Swallow whole. (Patient not taking: Reported on 07/11/2022), Disp: 90 tablet, Rfl: 3   gabapentin  (NEURONTIN) 300 MG capsule, Take by mouth. (Patient not taking: Reported on 07/11/2022), Disp: , Rfl:    hydrocortisone cream 1 %, Apply 1 application. topically 2 (two) times daily., Disp: 30 g, Rfl: 0   loperamide (IMODIUM) 2 MG capsule, Take 1 capsule (2 mg total) by mouth See admin instructions. Initial: 4 mg, followed by 2 mg after each loose stool; maximum: 16 mg/day, Disp: 30 capsule, Rfl: 0   pantoprazole (PROTONIX) 20 MG tablet, Take 1 tablet (20 mg total) by mouth daily. Take 1 hour prior to breakfast (Patient not taking: Reported on 07/11/2022), Disp: 30 tablet, Rfl: 2   pravastatin (PRAVACHOL) 80 MG tablet, Take 1 tablet (80 mg total) by mouth daily. (Patient not taking: Reported on 07/11/2022), Disp: 90 tablet, Rfl: 3   traZODone (DESYREL) 50 MG tablet, Take 0.5-1 tablets (25-50 mg total) by mouth at bedtime as needed for sleep. (Patient not taking: Reported on 07/11/2022), Disp: 30 tablet, Rfl: 3   Physical exam:  Vitals:   07/12/22 0424 07/12/22 0458 07/12/22 0745 07/12/22 1128  BP: 101/60  103/63 97/65  Pulse: 61  (!) 57 (!) 51  Resp: '18  17 17  '$ Temp: 98.1 F (36.7 C)  97.9 F (36.6 C) 98 F (36.7 C)  TempSrc:      SpO2: 96%  99% 99%  Weight:  125 lb (56.7 kg)    Height:       Physical Exam HENT:     Head: Normocephalic and atraumatic.  Eyes:     Pupils: Pupils are equal, round, and reactive to light.  Cardiovascular:     Rate and Rhythm: Normal rate and regular rhythm.     Heart sounds: Normal heart sounds.  Pulmonary:     Effort: Pulmonary effort is normal.     Breath sounds: Normal breath sounds.  Abdominal:     General: Bowel sounds are normal.     Palpations: Abdomen is soft.  Musculoskeletal:     Cervical back: Normal range of motion.  Skin:    General: Skin is warm and dry.  Neurological:     Mental Status: He is alert and oriented to person, place, and time.           Latest Ref Rng & Units 07/11/2022    5:51 AM  CMP  Glucose 70 - 99 mg/dL 103    BUN 8 - 23 mg/dL 27   Creatinine 0.61 - 1.24 mg/dL 1.65   Sodium 135 - 145 mmol/L 136   Potassium 3.5 - 5.1 mmol/L 3.8  Chloride 98 - 111 mmol/L 107   CO2 22 - 32 mmol/L 19   Calcium 8.9 - 10.3 mg/dL 8.8   Total Protein 6.5 - 8.1 g/dL 6.9   Total Bilirubin 0.3 - 1.2 mg/dL 0.9   Alkaline Phos 38 - 126 U/L 118   AST 15 - 41 U/L 32   ALT 0 - 44 U/L 20       Latest Ref Rng & Units 07/11/2022    2:49 PM  CBC  Hemoglobin 13.0 - 17.0 g/dL 6.5   Hematocrit 39.0 - 52.0 % 21.9     '@IMAGES'$ @  CT ABDOMEN PELVIS W CONTRAST  Result Date: 07/11/2022 CLINICAL DATA:  Abdominal pain with nausea, vomiting and diarrhea. Neutropenic. History of CLL. Currently on chemotherapy. EXAM: CT ABDOMEN AND PELVIS WITH CONTRAST TECHNIQUE: Multidetector CT imaging of the abdomen and pelvis was performed using the standard protocol following bolus administration of intravenous contrast. RADIATION DOSE REDUCTION: This exam was performed according to the departmental dose-optimization program which includes automated exposure control, adjustment of the mA and/or kV according to patient size and/or use of iterative reconstruction technique. CONTRAST:  111m OMNIPAQUE IOHEXOL 300 MG/ML  SOLN COMPARISON:  05/08/2022 FINDINGS: Lower chest: Lung bases are clear. Hepatobiliary: Gallbladder is somewhat contracted. Liver and biliary tree are normal. Pancreas: Normal. Spleen: Normal. Adrenals/Urinary Tract: Adrenal glands are normal. Kidneys are normal in size without hydronephrosis or nephrolithiasis. Possible small cyst over the upper pole left kidney unchanged. Bladder is normal. Stomach/Bowel: Stomach and small bowel are normal. Appendix not well visualized. Colon is decompressed distal to the splenic flexure. Vascular/Lymphatic: Mild calcified plaque over the abdominal aorta which is normal in caliber. Stent unchanged over the right femoral artery. Known significant bulky adenopathy throughout the abdomen/mesentery and  retroperitoneum with mild to moderate overall interval improvement. Improved external iliac chain adenopathy. Reproductive: Unremarkable. Other: Small amount of free fluid over the posterior pelvis which is new. Musculoskeletal: No acute findings. IMPRESSION: 1. No acute findings in the abdomen/pelvis. 2. Known significant bulky adenopathy throughout the abdomen/mesentery and retroperitoneum with mild to moderate overall improvement. Improved external iliac chain adenopathy. Findings compatible with known CLL and ongoing chemotherapy. 3. Small amount of nonspecific free fluid over the posterior pelvis which is new. 4. Aortic atherosclerosis. Aortic Atherosclerosis (ICD10-I70.0). Electronically Signed   By: DMarin OlpM.D.   On: 07/11/2022 09:05   DG Chest 2 View  Result Date: 07/11/2022 CLINICAL DATA:  Shortness of breath EXAM: CHEST - 2 VIEW COMPARISON:  06/15/2022 and prior studies FINDINGS: The cardiomediastinal silhouette is unremarkable. There is no evidence of focal airspace disease, pulmonary edema, pleural effusion, or pneumothorax. No acute bony abnormalities are identified. Surgical material in the RIGHT axillary region noted. Little interval change since the prior study. IMPRESSION: No active cardiopulmonary disease. Electronically Signed   By: JMargarette CanadaM.D.   On: 07/11/2022 06:39   ECHOCARDIOGRAM COMPLETE  Result Date: 06/16/2022    ECHOCARDIOGRAM REPORT   Patient Name:   DJAS BETTENDate of Exam: 06/16/2022 Medical Rec #:  [id number]   Height:       68.0 in Accession #:    [id number]  Weight:       129.6 lb Date of Birth:  [birth year]   BSA:          1.700 m Patient Age:    89years     BP:           151/64 mmHg Patient Gender: M  HR:           43 bpm. Exam Location:  [facility] Procedure: 2D Echo, Cardiac Doppler and Color Doppler Indications:     R55 Syncope  History:         Patient has no prior history of Echocardiogram examinations.                  Risk Factors:Dyslipidemia.   Sonographer:     Bernadene Person RDCS Referring Phys:  [id number] [facility] AMERY Diagnosing Phys: Nelva Bush MD IMPRESSIONS  1. Left ventricular ejection fraction, by estimation, is 55 to 60%. The left ventricle has normal function. The left ventricle has no regional wall motion abnormalities. Left ventricular diastolic parameters are indeterminate.  2. Right ventricular systolic function is low normal. The right ventricular size is mildly enlarged. There is normal pulmonary artery systolic pressure.  3. Right atrial size was mildly dilated.  4. The mitral valve is normal in structure. Mild mitral valve regurgitation. No evidence of mitral stenosis.  5. The aortic valve is tricuspid. There is mild thickening of the aortic valve. Aortic valve regurgitation is mild. Aortic valve sclerosis is present, with no evidence of aortic valve stenosis.  6. There is borderline dilatation of the ascending aorta, measuring 36 mm.  7. The inferior vena cava is normal in size with greater than 50% respiratory variability, suggesting right atrial pressure of 3 mmHg. FINDINGS  Left Ventricle: Left ventricular ejection fraction, by estimation, is 55 to 60%. The left ventricle has normal function. The left ventricle has no regional wall motion abnormalities. The left ventricular internal cavity size was normal in size. There is  no left ventricular hypertrophy. Left ventricular diastolic parameters are indeterminate. Right Ventricle: The right ventricular size is mildly enlarged. No increase in right ventricular wall thickness. Right ventricular systolic function is low normal. There is normal pulmonary artery systolic pressure. The tricuspid regurgitant velocity is 1.71 m/s, and with an assumed right atrial pressure of 3 mmHg, the estimated right ventricular systolic pressure is 58.0 mmHg. Left Atrium: Left atrial size was normal in size. Right Atrium: Right atrial size was mildly dilated. Pericardium: The pericardium was not well  visualized. Mitral Valve: The mitral valve is normal in structure. Mild mitral valve regurgitation. No evidence of mitral valve stenosis. Tricuspid Valve: The tricuspid valve is normal in structure. Tricuspid valve regurgitation is trivial. Aortic Valve: The aortic valve is tricuspid. There is mild thickening of the aortic valve. Aortic valve regurgitation is mild. Aortic regurgitation PHT measures 770 msec. Aortic valve sclerosis is present, with no evidence of aortic valve stenosis. Pulmonic Valve: The pulmonic valve was normal in structure. Pulmonic valve regurgitation is trivial. No evidence of pulmonic stenosis. Aorta: The aortic root is normal in size and structure. There is borderline dilatation of the ascending aorta, measuring 36 mm. Pulmonary Artery: The pulmonary artery is not well seen. Venous: The inferior vena cava is normal in size with greater than 50% respiratory variability, suggesting right atrial pressure of 3 mmHg. IAS/Shunts: The interatrial septum was not well visualized.  LEFT VENTRICLE PLAX 2D LVIDd:         5.21 cm     Diastology LVIDs:         3.23 cm     LV e' medial:    7.90 cm/s LV PW:         0.69 cm     LV E/e' medial:  6.8 LV IVS:        0.67 cm  LV e' lateral:   9.48 cm/s LVOT diam:     2.10 cm     LV E/e' lateral: 5.7 LV SV:         93 LV SV Index:   55 LVOT Area:     3.46 cm  LV Volumes (MOD) LV vol d, MOD A2C: 95.6 ml LV vol d, MOD A4C: 59.8 ml LV vol s, MOD A2C: 34.2 ml LV vol s, MOD A4C: 23.8 ml LV SV MOD A2C:     61.4 ml LV SV MOD A4C:     59.8 ml LV SV MOD BP:      48.7 ml RIGHT VENTRICLE RV Basal diam:  4.40 cm RV S prime:     9.89 cm/s TAPSE (M-mode): 2.5 cm LEFT ATRIUM           Index        RIGHT ATRIUM           Index LA diam:      3.90 cm 2.29 cm/m   RA Area:     19.60 cm LA Vol (A2C): 45.3 ml 26.65 ml/m  RA Volume:   53.50 ml  31.47 ml/m LA Vol (A4C): 50.7 ml 29.83 ml/m  AORTIC VALVE LVOT Vmax:   111.00 cm/s LVOT Vmean:  73.500 cm/s LVOT VTI:    0.269 m AI  PHT:      770 msec  AORTA Ao Root diam: 3.50 cm Ao Asc diam:  3.60 cm MITRAL VALVE               TRICUSPID VALVE MV Area (PHT): 2.32 cm    TR Peak grad:   11.7 mmHg MV Decel Time: 327 msec    TR Vmax:        171.00 cm/s MV E velocity: 53.90 cm/s MV A velocity: 65.20 cm/s  SHUNTS MV E/A ratio:  0.83        Systemic VTI:  0.27 m                            Systemic Diam: 2.10 cm Nelva Bush MD Electronically signed by Nelva Bush MD Signature Date/Time: 06/16/2022/2:50:44 PM    Final    DG Chest Portable 1 View  Result Date: 06/15/2022 CLINICAL DATA:  Single episode this morning. EXAM: PORTABLE CHEST 1 VIEW COMPARISON:  05/12/2021 FINDINGS: The heart size and mediastinal contours are within normal limits. Both lungs are clear. The visualized skeletal structures are unremarkable. IMPRESSION: No active disease. Electronically Signed   By: Kathreen Devoid M.D.   On: 06/15/2022 11:20    Assessment and plan- Patient is a 71 y.o. 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  Defer fluid management and antibiotics to primary team.  Diarrhea: Possibly secondary to acalabrutinib which I would recommend to hold at this time.  C. difficile testing negative  CLL: Patient does have significant lymphocytosis which is more likely effect of acalabrutinib since he had a similarly elevated white cell count about 2 weeks ago roughly a month after starting acalabrutinib.  He does not require any medication to bring down his lymphocytosis which can sometimes go even higher before it starts trending down with acalabrutinib.  However given ongoing severe sepsis, I would recommend to hold acalabrutinib.  Microcytic anemia: Hemoglobin 2 weeks  ago was 9.4 and presently down to 7.3.  Could be all secondary to sepsis but I  will also order LDH reticulocyte count haptoglobin and Coombs test to rule out a secondary autoimmune hemolytic anemia process which can be seen in patients with CLL.  Continue supportive transfusions to maintain hemoglobin greater than 7.  I will check ferritin and iron studies B12 and folate with tomorrow's labs Dr. Tasia Catchings will follow from tomorrow onwards.   Visit Diagnosis 1. Dyspnea, unspecified type   2. Symptomatic anemia   3. AKI (acute kidney injury) ([place])   4. Nausea vomiting and diarrhea   5. Dehydration   6. Urinary tract infection without hematuria, site unspecified     Dr. Randa Evens, MD, MPH [facility] [phone] 07/12/2022

## 2022-07-11 NOTE — ED Notes (Signed)
Patient transported to CT 

## 2022-07-11 NOTE — Plan of Care (Signed)
  Problem: Education: Goal: Knowledge of General Education information will improve Description Including pain rating scale, medication(s)/side effects and non-pharmacologic comfort measures Outcome: Progressing   Problem: Health Behavior/Discharge Planning: Goal: Ability to manage health-related needs will improve Outcome: Progressing   

## 2022-07-11 NOTE — Progress Notes (Signed)
Blood cultures returned positive for E. coli.  Follow sensitivities.

## 2022-07-11 NOTE — ED Provider Notes (Signed)
[facility] Provider Note    Event Date/Time   First MD Initiated Contact with Patient 07/11/22 [phone]     (approximate)   History   Shortness of Breath   HPI  [name] is a 71 y.o. male with CLL now with a very high white blood count.  Complains of 2 days of nausea vomiting and diarrhea.  Additionally he is short of breath all the time.  He has had shaking chills and burning with urination for the last 2 days as well and when he takes a deep breath he has pain in the bilateral CVA area.  He has not been running a fever.  Although his temperature here is 99.8.      Physical Exam   Triage Vital Signs: ED Triage Vitals  Enc Vitals Group     BP 07/11/22 0540 121/73     Pulse Rate 07/11/22 0540 85     Resp 07/11/22 0540 20     Temp 07/11/22 0540 99.8 F (37.7 C)     Temp Source 07/11/22 0540 Oral     SpO2 07/11/22 0540 100 %     Weight 07/11/22 0542 125 lb (56.7 kg)     Height 07/11/22 0542 '5\' 8"'$  (1.727 m)     Head Circumference --      Peak Flow --      Pain Score 07/11/22 0541 10     Pain Loc --      Pain Edu? --      Excl. in [place]? --     Most recent vital signs: Vitals:   07/11/22 0540  BP: 121/73  Pulse: 85  Resp: 20  Temp: 99.8 F (37.7 C)  SpO2: 100%     General: Awake, alert breathing hard CV:  Good peripheral perfusion.  Heart regular rate and rhythm no audible murmurs Resp:  Normal effort.  Lungs are clear Abd:  No distention.  Soft nontender bowel sounds positive Rectal: Stool is black but Hemoccult negative.  There are approximately 3 or 4 ulcers around the rectum which are not particularly tender there is somewhat deep and surrounded by whitish rims.  None of them are more than 2 mm in diameter.  At least one is at least 2 mm deep.   ED Results / Procedures / Treatments   Labs (all labs ordered are listed, but only abnormal results are displayed) Labs Reviewed  COMPREHENSIVE METABOLIC PANEL - Abnormal; Notable for  the following components:      Result Value   CO2 19 (*)    Glucose, Bld 103 (*)    BUN 27 (*)    Creatinine, Ser 1.65 (*)    Calcium 8.8 (*)    Albumin 3.2 (*)    GFR, Estimated 45 (*)    All other components within normal limits  CBC - Abnormal; Notable for the following components:   WBC 145.3 (*)    RBC 2.89 (*)    Hemoglobin 7.3 (*)    HCT 26.4 (*)    MCH 25.3 (*)    MCHC 27.7 (*)    RDW 16.7 (*)    All other components within normal limits  URINALYSIS, ROUTINE W REFLEX MICROSCOPIC - Abnormal; Notable for the following components:   Color, Urine YELLOW (*)    APPearance CLOUDY (*)    Protein, ur 30 (*)    Leukocytes,Ua MODERATE (*)    Bacteria, UA RARE (*)    All other components within normal  limits  LACTIC ACID, PLASMA - Abnormal; Notable for the following components:   Lactic Acid, Venous 3.7 (*)    All other components within normal limits  CBC WITH DIFFERENTIAL/PLATELET - Abnormal; Notable for the following components:   WBC 143.7 (*)    RBC 2.85 (*)    Hemoglobin 7.2 (*)    HCT 25.8 (*)    MCH 25.3 (*)    MCHC 27.9 (*)    RDW 16.8 (*)    Lymphs Abs 137.7 (*)    Abs Immature Granulocytes 0.34 (*)    All other components within normal limits  RESP PANEL BY RT-PCR (FLU A&B, COVID) ARPGX2  CULTURE, BLOOD (ROUTINE X 2)  CULTURE, BLOOD (ROUTINE X 2)  URINE CULTURE  GASTROINTESTINAL PANEL BY PCR, STOOL (REPLACES STOOL CULTURE)  C DIFFICILE QUICK SCREEN W PCR REFLEX    LIPASE, BLOOD  LACTIC ACID, PLASMA  PATHOLOGIST SMEAR REVIEW  PROCALCITONIN  URINALYSIS, ROUTINE W REFLEX MICROSCOPIC  RETICULOCYTES  LACTATE DEHYDROGENASE  HAPTOGLOBIN  TYPE AND SCREEN  PREPARE RBC (CROSSMATCH)  DAT, POLYSPECIFIC AHG ([facility] ONLY)  TROPONIN I (HIGH SENSITIVITY)  TROPONIN I (HIGH SENSITIVITY)     EKG  EKG read interpreted by me shows normal sinus rhythm 81 left axis no acute changes   RADIOLOGY Chest x-ray read by radiology reviewed and interpreted by me shows no  acute disease   PROCEDURES:  Critical Care performed:   Procedures   MEDICATIONS ORDERED IN ED: Medications  0.9 %  sodium chloride infusion (has no administration in time range)  lactated ringers bolus 1,000 mL (has no administration in time range)  levofloxacin (LEVAQUIN) IVPB 500 mg (has no administration in time range)  lactated ringers infusion (has no administration in time range)  ondansetron (ZOFRAN) injection 4 mg (4 mg Intravenous Given 07/11/22 0550)  lactated ringers bolus 1,000 mL (1,000 mLs Intravenous New Bag/Given 07/11/22 0824)  iohexol (OMNIPAQUE) 300 MG/ML solution 100 mL (100 mLs Intravenous Contrast Given 07/11/22 0830)     IMPRESSION / MDM / ASSESSMENT AND PLAN / ED COURSE  I reviewed the triage vital signs and the nursing notes. Patient's wife is worried he may have a UTI.  With the burning when he urinates shaking chills and pain in his CVA areas this is entirely possible.  Additionally he is having nausea vomiting diarrhea I am hoping that CT abdomen will either show [name] some colitis or pyelonephritis.  I am giving him some fluids and attempt to make him urinate.  He has had blood transfusions in the past and consents to have another one if he needs it.  Anticipate with hydration his H&H will drop even further making him need a transfusion.  His anemia may be what is making him short of breath as well or at least contributing to it. There does not seem to be any sign of pulmonary embolus at this time.         FINAL CLINICAL IMPRESSION(S) / ED DIAGNOSES   Final diagnoses:  Dyspnea, unspecified type  Symptomatic anemia  AKI (acute kidney injury) ([place])  Nausea vomiting and diarrhea  Dehydration  Urinary tract infection without hematuria, site unspecified     Rx / DC Orders   ED Discharge Orders     None        Note:  This document was prepared using Dragon voice recognition software and may include unintentional dictation errors.   Nena Polio, MD 07/11/22 [phone]

## 2022-07-11 NOTE — Progress Notes (Signed)
PHARMACY NOTE:  ANTIMICROBIAL RENAL DOSAGE ADJUSTMENT  Current antimicrobial regimen includes a mismatch between antimicrobial dosage and estimated renal function.  As per policy approved by the Pharmacy & Therapeutics and Medical Executive Committees, the antimicrobial dosage will be adjusted accordingly.  Current antimicrobial dosage:  levaquin '500mg'$  IV q24h  Indication: UTI/bacteremia  Renal Function:  Estimated Creatinine Clearance: 34.4 mL/min (A) (by C-G formula based on SCr of 1.65 mg/dL (H)).  Antimicrobial dosage has been changed to:   Levaquin '750mg'$  IV q48 hr for crcl 34 ml/min  Additional comments:f/u culture   Thank you for allowing pharmacy to be a part of this patient's care.  [place], [facility] 07/11/2022 10:39 PM

## 2022-07-12 DIAGNOSIS — R0600 Dyspnea, unspecified: Secondary | ICD-10-CM

## 2022-07-12 DIAGNOSIS — D638 Anemia in other chronic diseases classified elsewhere: Secondary | ICD-10-CM

## 2022-07-12 DIAGNOSIS — N390 Urinary tract infection, site not specified: Secondary | ICD-10-CM

## 2022-07-12 DIAGNOSIS — R112 Nausea with vomiting, unspecified: Secondary | ICD-10-CM

## 2022-07-12 DIAGNOSIS — E860 Dehydration: Secondary | ICD-10-CM

## 2022-07-12 DIAGNOSIS — A419 Sepsis, unspecified organism: Secondary | ICD-10-CM

## 2022-07-12 DIAGNOSIS — Z87891 Personal history of nicotine dependence: Secondary | ICD-10-CM

## 2022-07-12 DIAGNOSIS — C9110 Chronic lymphocytic leukemia of B-cell type not having achieved remission: Secondary | ICD-10-CM

## 2022-07-12 DIAGNOSIS — R6520 Severe sepsis without septic shock: Secondary | ICD-10-CM

## 2022-07-12 DIAGNOSIS — R197 Diarrhea, unspecified: Secondary | ICD-10-CM

## 2022-07-12 DIAGNOSIS — N179 Acute kidney failure, unspecified: Secondary | ICD-10-CM | POA: Diagnosis not present

## 2022-07-12 DIAGNOSIS — E871 Hypo-osmolality and hyponatremia: Secondary | ICD-10-CM

## 2022-07-12 DIAGNOSIS — D649 Anemia, unspecified: Secondary | ICD-10-CM | POA: Diagnosis not present

## 2022-07-12 DIAGNOSIS — N189 Chronic kidney disease, unspecified: Secondary | ICD-10-CM

## 2022-07-12 LAB — CBC WITH DIFFERENTIAL/PLATELET
Abs Immature Granulocytes: 0.4 10*3/uL — ABNORMAL HIGH (ref 0.00–0.07)
Basophils Absolute: 0 10*3/uL (ref 0.0–0.1)
Basophils Relative: 0 %
Eosinophils Absolute: 0 10*3/uL (ref 0.0–0.5)
Eosinophils Relative: 0 %
HCT: 23.1 % — ABNORMAL LOW (ref 39.0–52.0)
Hemoglobin: 7.2 g/dL — ABNORMAL LOW (ref 13.0–17.0)
Immature Granulocytes: 1 %
Lymphocytes Relative: 94 %
Lymphs Abs: 81.3 10*3/uL — ABNORMAL HIGH (ref 0.7–4.0)
MCH: 26.2 pg (ref 26.0–34.0)
MCHC: 31.2 g/dL (ref 30.0–36.0)
MCV: 84 fL (ref 80.0–100.0)
Monocytes Absolute: 0.4 10*3/uL (ref 0.1–1.0)
Monocytes Relative: 0 %
Neutro Abs: 4.3 10*3/uL (ref 1.7–7.7)
Neutrophils Relative %: 5 %
Platelets: 206 10*3/uL (ref 150–400)
RBC: 2.75 MIL/uL — ABNORMAL LOW (ref 4.22–5.81)
RDW: 16.4 % — ABNORMAL HIGH (ref 11.5–15.5)
Smear Review: ADEQUATE
WBC: 86.4 10*3/uL (ref 4.0–10.5)
nRBC: 0 % (ref 0.0–0.2)

## 2022-07-12 LAB — COMPREHENSIVE METABOLIC PANEL
ALT: 20 U/L (ref 0–44)
AST: 27 U/L (ref 15–41)
Albumin: 2.5 g/dL — ABNORMAL LOW (ref 3.5–5.0)
Alkaline Phosphatase: 87 U/L (ref 38–126)
Anion gap: 9 (ref 5–15)
BUN: 25 mg/dL — ABNORMAL HIGH (ref 8–23)
CO2: 20 mmol/L — ABNORMAL LOW (ref 22–32)
Calcium: 8.5 mg/dL — ABNORMAL LOW (ref 8.9–10.3)
Chloride: 105 mmol/L (ref 98–111)
Creatinine, Ser: 1.45 mg/dL — ABNORMAL HIGH (ref 0.61–1.24)
GFR, Estimated: 52 mL/min — ABNORMAL LOW (ref >60)
Glucose, Bld: 83 mg/dL (ref 70–99)
Potassium: 4.1 mmol/L (ref 3.5–5.1)
Sodium: 134 mmol/L — ABNORMAL LOW (ref 135–145)
Total Bilirubin: 1.2 mg/dL (ref 0.3–1.2)
Total Protein: 5.4 g/dL — ABNORMAL LOW (ref 6.5–8.1)

## 2022-07-12 LAB — TYPE AND SCREEN
ABO/RH(D): A NEG
Antibody Screen: NEGATIVE
Unit division: 0
Unit division: 0

## 2022-07-12 LAB — BPAM RBC
Blood Product Expiration Date: 202307252359
Blood Product Expiration Date: 202308032359
ISSUE DATE / TIME: 202307221044
ISSUE DATE / TIME: 202307221736
Unit Type and Rh: 600
Unit Type and Rh: 9500

## 2022-07-12 LAB — MAGNESIUM: Magnesium: 2.4 mg/dL (ref 1.7–2.4)

## 2022-07-12 LAB — PROCALCITONIN: Procalcitonin: 31.15 ng/mL

## 2022-07-12 LAB — PHOSPHORUS: Phosphorus: 3.4 mg/dL (ref 2.5–4.6)

## 2022-07-12 MED ORDER — TRAZODONE HCL 50 MG PO TABS
50.0000 mg | ORAL_TABLET | Freq: Every day | ORAL | Status: DC
  Administered 2022-07-12: 50 mg via ORAL
  Filled 2022-07-12: qty 1

## 2022-07-12 NOTE — Progress Notes (Signed)
  Progress Note   Patient: [name] ZOX:[id number] DOB: Oct 07, 1951 DOA: 07/11/2022     1 DOS: the patient was seen and examined on 07/12/2022   Assessment and Plan: * Severe sepsis ([place]) Present on admission, E. coli growing out of blood cultures.  May be urine source.  Still awaiting urine culture.  Patient had fever of 102 and leukocytosis on presentation.  Patient started on Levaquin.  Acute kidney injury superimposed on CKD ([place]) AKI on CKD stage II (baseline creatinine around 1.26).  Creatinine 1.65 on presentation and down to 1.45.  Recheck tomorrow morning.  Chronic lymphocytic leukemia (CLL), B-cell (HCC) White blood cell count very elevated on presentation 143.7 and down to 86.4.  Case discussed with Dr. Janese Banks oncology.  Holding [place] at this time.  Hyponatremia Sodium only one-point less than the normal range.  Anemia of chronic disease Last hemoglobin 7.2.  Transfuse if hemoglobin under 7.  Diarrhea Stool studies negative.        Subjective: Patient feels a little bit weak.  Does not offer any complaints.  Came in with diarrhea and found to have sepsis with E. coli.  Physical Exam: Vitals:   07/12/22 0424 07/12/22 0458 07/12/22 0745 07/12/22 1128  BP: 101/60  103/63 97/65  Pulse: 61  (!) 57 (!) 51  Resp: '18  17 17  '$ Temp: 98.1 F (36.7 C)  97.9 F (36.6 C) 98 F (36.7 C)  TempSrc:      SpO2: 96%  99% 99%  Weight:  56.7 kg    Height:       Physical Exam HENT:     Head: Normocephalic.     Mouth/Throat:     Pharynx: No oropharyngeal exudate.  Eyes:     General: Lids are normal.     Conjunctiva/sclera: Conjunctivae normal.  Cardiovascular:     Rate and Rhythm: Normal rate and regular rhythm.     Heart sounds: Normal heart sounds, S1 normal and S2 normal.  Pulmonary:     Breath sounds: No decreased breath sounds, wheezing, rhonchi or rales.  Abdominal:     Palpations: Abdomen is soft.     Tenderness: There is no abdominal tenderness.   Musculoskeletal:     Right lower leg: No swelling.     Left lower leg: No swelling.  Skin:    General: Skin is warm.     Findings: No rash.  Neurological:     Mental Status: He is alert and oriented to person, place, and time.     Data Reviewed: Blood cultures positive for E. coli, white blood cell count 86.4, hemoglobin 7.2, creatinine 1.45, sodium 134  Family Communication:   Disposition: Status is: Inpatient Remains inpatient appropriate because: Being treated for sepsis with IV antibiotics  Planned Discharge Destination: Home    Time spent: 29 minutes Case discussed with hematology  Author: Loletha Grayer, MD 07/12/2022 1:46 PM  For on call review [email or web address].

## 2022-07-12 NOTE — Assessment & Plan Note (Signed)
Sodium only one-point less than the normal range.

## 2022-07-12 NOTE — Assessment & Plan Note (Signed)
Present on admission, E. coli growing out of blood cultures.  May be urine source.  Still awaiting urine culture.  Patient had fever of 102 and leukocytosis on presentation.  Patient started on Levaquin.

## 2022-07-12 NOTE — Assessment & Plan Note (Signed)
Stool studies negative

## 2022-07-12 NOTE — Assessment & Plan Note (Signed)
White blood cell count very elevated on presentation 143.7 and down to 86.4.  Case discussed with Dr. Janese Banks oncology.  Holding Calquence at this time.

## 2022-07-12 NOTE — Assessment & Plan Note (Signed)
Last hemoglobin 7.2.  Transfuse if hemoglobin under 7.

## 2022-07-12 NOTE — Evaluation (Signed)
Physical Therapy Evaluation Patient Details Name: [name] MRN: [id number] DOB: July 16, 1951 Today's Date: 07/12/2022  History of Present Illness  Patient is a 71 year old male with PMH including leukemia, arthritis, hyperlipidemia, chronic lower back pain, and PVD who presented to ED with nausea, vomiting, and BLE weakness.  Pt admitted for acute blood loss anemia and acute kidney injury.   Clinical Impression  Pt admitted with above diagnosis. Pt received upright in bed agreeable to PT services. Reports independence in mobility and ADL's/IADL's at baseline, still currently working cleaning churches.  To date, pt able to perform bed mobility mod-I, stand and ambulate with independence with normal, reciprocal gait. Pt endorsing minor weakness and fatigue but close to his baseline abilities. Pt completing 5xSTS at 14 sec indicating minor LE weakness and risk for falls as general cut off is 12 sec. Some difficulty following commands of testing needing VC's throughout so suspect pt could perform quicker. All needs in reach in recliner. No current, acute PT needs at this time due to pt being close to baseline. PT to sign off.      Recommendations for follow up therapy are one component of a multi-disciplinary discharge planning process, led by the attending physician.  Recommendations may be updated based on patient status, additional functional criteria and insurance authorization.  Follow Up Recommendations No PT follow up      Assistance Recommended at Discharge None  Patient can return home with the following       Equipment Recommendations None recommended by PT  Recommendations for Other Services       Functional Status Assessment Patient has not had a recent decline in their functional status     Precautions / Restrictions Precautions Precautions: Fall Restrictions Weight Bearing Restrictions: No      Mobility  Bed Mobility Overal bed mobility: Modified Independent              General bed mobility comments: HOB elevated Patient Response: Cooperative  Transfers Overall transfer level: Independent   Transfers: Sit to/from Stand Sit to Stand: Independent           General transfer comment: no use of AD for sit to stand    Ambulation/Gait Ambulation/Gait assistance: Independent Gait Distance (Feet): 200 Feet Assistive device: None Gait Pattern/deviations: WFL(Within Functional Limits), Step-through pattern          Stairs            Wheelchair Mobility    Modified Rankin (Stroke Patients Only)       Balance Overall balance assessment: Needs assistance Sitting-balance support: No upper extremity supported Sitting balance-Leahy Scale: Fair       Standing balance-Leahy Scale: Fair                               Pertinent Vitals/Pain Pain Assessment Pain Assessment: Faces Faces Pain Scale: Hurts little more Pain Location: low back, headache Pain Descriptors / Indicators: Headache, Grimacing Pain Intervention(s): Limited activity within patient's tolerance, Monitored during session, Repositioned    Home Living Family/patient expects to be discharged to:: Private residence Living Arrangements: Spouse/significant other Available Help at Discharge: Family;Available 24 hours/day Type of Home: Mobile home Home Access: Stairs to enter Entrance Stairs-Rails: Can reach both Entrance Stairs-Number of Steps: 7-8 (back and front)   Home Layout: One level Home Equipment: Shower seat - built in      Prior Function Prior Level of Function : History of  Falls (last six months);Independent/Modified Independent             Mobility Comments: Pt independent with mobility without use of AD ADLs Comments: Independent in ADLs and IADLs. 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  His wife manages his medications.  He endorses one fall in the last 6 months in which he lost  his balance picking something up off the floor.     Hand Dominance   Dominant Hand: Right    Extremity/Trunk Assessment   Upper Extremity Assessment Upper Extremity Assessment: Defer to OT evaluation    Lower Extremity Assessment Lower Extremity Assessment: Generalized weakness;Overall WFL for tasks assessed (5xSTS at 14 seconds)    Cervical / Trunk Assessment Cervical / Trunk Assessment: Normal  Communication   Communication: No difficulties  Cognition Arousal/Alertness: Awake/alert Behavior During Therapy: WFL for tasks assessed/performed Overall Cognitive Status: Within Functional Limits for tasks assessed                                          General Comments      Exercises Other Exercises Other Exercises: Role of PT in acute setting, d/c needs, benefits of STS exercise for LE strengthening   Assessment/Plan    PT Assessment Patient does not need any further PT services  PT Problem List         PT Treatment Interventions      PT Goals (Current goals can be found in the Care Plan section)  Acute Rehab PT Goals Patient Stated Goal: return to work PT Goal Formulation: All assessment and education complete, DC therapy    Frequency       Co-evaluation               AM-PAC PT "6 Clicks" Mobility  Outcome Measure Help needed turning from your back to your side while in a flat bed without using bedrails?: None Help needed moving from lying on your back to sitting on the side of a flat bed without using bedrails?: None Help needed moving to and from a bed to a chair (including a wheelchair)?: None Help needed standing up from a chair using your arms (e.g., wheelchair or bedside chair)?: None Help needed to walk in hospital room?: None Help needed climbing 3-5 steps with a railing? : A Little 6 Click Score: 23    End of Session   Activity Tolerance: Patient tolerated treatment well Patient left: in chair;with chair alarm  set Nurse Communication: Mobility status PT Visit Diagnosis: Muscle weakness (generalized) (M62.81)    Time: 7371-0626 PT Time Calculation (min) (ACUTE ONLY): 12 min   Charges:   PT Evaluation $PT Eval Low Complexity: 1 Low         Uzoma Vivona M. Fairly IV, PT, DPT Physical Therapist- [facility]  07/12/2022, 10:42 AM

## 2022-07-12 NOTE — Assessment & Plan Note (Signed)
AKI on CKD stage II (baseline creatinine around 1.26).  Creatinine 1.65 on presentation and down to 1.45.  Recheck tomorrow morning.

## 2022-07-12 NOTE — Evaluation (Addendum)
Occupational Therapy Evaluation Patient Details Name: [name] MRN: [id number] DOB: 1951-06-01 Today's Date: 07/12/2022   History of Present Illness Patient is a 71 year old male with PMH including leukemia, arthritis, hyperlipidemia, chronic lower back pain, and PVD who presented to ED with nausea, vomiting, and BLE weakness.  Pt admitted for acute blood loss anemia and acute kidney injury.   Clinical Impression   [name] presents to OT with generalized weakness and low back pain with limited activity tolerance that impacts his engagement in self care tasks.  Prior to admission, patient lived with his wife and was independent in all ADLs/IADLs.  Currently, patient requires setup-supervision assist for basic ADLs due to generalized weakness and limited activity tolerance.  OT provided setup/supervision assist for bed mobility, sit to stand transfer, upper body bathing, perihygiene, and upper body dressing at edge of bed.  His ability to complete full body sponge bath was limited due to his back pain and low activity tolerance.  [name] will likely continue to benefit from skilled OT services in acute setting to support functional strengthening, pain management, endurance, and safety and independence in ADLs.  Recommend HHOT upon discharge to further support these goals.     Recommendations for follow up therapy are one component of a multi-disciplinary discharge planning process, led by the attending physician.  Recommendations may be updated based on patient status, additional functional criteria and insurance authorization.   Follow Up Recommendations  Home health OT    Assistance Recommended at Discharge Set up Supervision/Assistance  Patient can return home with the following A little help with walking and/or transfers;Assistance with cooking/housework;Direct supervision/assist for medications management;Assist for transportation;Help with stairs or ramp for entrance    Functional  Status Assessment  Patient has had a recent decline in their functional status and demonstrates the ability to make significant improvements in function in a reasonable and predictable amount of time.  Equipment Recommendations  None recommended by OT    Recommendations for Other Services       Precautions / Restrictions Precautions Precautions: Fall Restrictions Weight Bearing Restrictions: No      Mobility Bed Mobility Overal bed mobility: Modified Independent             General bed mobility comments: HOB elevated Patient Response: Cooperative  Transfers Overall transfer level: Needs assistance   Transfers: Sit to/from Stand Sit to Stand: Supervision           General transfer comment: no use of AD for sit to stand      Balance Overall balance assessment: Needs assistance Sitting-balance support: No upper extremity supported Sitting balance-Leahy Scale: Fair       Standing balance-Leahy Scale: Fair                             ADL either performed or assessed with clinical judgement   ADL Overall ADL's : Needs assistance/impaired Eating/Feeding: Set up;Sitting   Grooming: Set up;Sitting   Upper Body Bathing: Set up;Sitting   Lower Body Bathing: Supervison/ safety;Sit to/from stand   Upper Body Dressing : Set up;Sitting   Lower Body Dressing: Min guard;Sit to/from stand   Toilet Transfer: Armed forces technical officer Details (indicate cue type and reason): did not test ambulation   Toileting - Clothing Manipulation Details (indicate cue type and reason): not tested   Tub/Shower Transfer Details (indicate cue type and reason): not tested Functional mobility during ADLs: Rolling walker (2 wheels) General  ADL Comments: Pt able to complete basic ADLs at edge of bed with supervision assist.  He is able to complete all ADLs from seated position with setup assist, provided supervision assist with standing ADLs.  Did not  assess further ambulation.     Vision Baseline Vision/History: 1 Wears glasses Ability to See in Adequate Light: 1 Impaired Patient Visual Report: No change from baseline Vision Assessment?: No apparent visual deficits     Perception     Praxis      Pertinent Vitals/Pain Pain Assessment Pain Assessment: Faces Faces Pain Scale: Hurts little more Pain Location: low back, headache Pain Descriptors / Indicators: Headache, Moaning, Grimacing Pain Intervention(s): Limited activity within patient's tolerance, Monitored during session, Repositioned     Hand Dominance Right   Extremity/Trunk Assessment Upper Extremity Assessment Upper Extremity Assessment: Defer to OT evaluation   Lower Extremity Assessment Lower Extremity Assessment: Generalized weakness;Overall WFL for tasks assessed (5xSTS at 14 seconds)   Cervical / Trunk Assessment Cervical / Trunk Assessment: Normal   Communication Communication Communication: No difficulties   Cognition Arousal/Alertness: Awake/alert Behavior During Therapy: WFL for tasks assessed/performed Overall Cognitive Status: Within Functional Limits for tasks assessed                                 General Comments: pleasant and receptive to OT     General Comments       Exercises Other Exercises Other Exercises: provided education re: OT role and plan of care, fall and safety precautions, discharge recommendations, setup-sup for ADL practice   Shoulder Instructions      Home Living Family/patient expects to be discharged to:: Private residence Living Arrangements: Spouse/significant other Available Help at Discharge: Family;Available 24 hours/day Type of Home: Mobile home Home Access: Stairs to enter Entrance Stairs-Number of Steps: 7-8 (back and front) Entrance Stairs-Rails: Can reach both Home Layout: One level     Bathroom Shower/Tub: Occupational psychologist: Standard Bathroom Accessibility: Yes    Home Equipment: Shower seat - built in          Prior Functioning/Environment Prior Level of Function : History of Falls (last six months);Independent/Modified Independent             Mobility Comments: Pt independent with mobility without use of AD ADLs Comments: Independent in ADLs and IADLs. 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  His wife manages his medications.  He endorses one fall in the last 6 months in which he lost his balance picking something up off the floor.        OT Problem List: Decreased strength;Decreased activity tolerance;Impaired balance (sitting and/or standing);Decreased knowledge of use of DME or AE;Pain      OT Treatment/Interventions: Self-care/ADL training;Therapeutic exercise;Energy conservation;DME and/or AE instruction;Therapeutic activities;Patient/family education;Balance training    OT Goals(Current goals can be found in the care plan section) Acute Rehab OT Goals Patient Stated Goal: to return home, manage pain OT Goal Formulation: With patient Time For Goal Achievement: 07/26/22 Potential to Achieve Goals: Good ADL Goals Pt Will Perform Lower Body Dressing: with adaptive equipment;with modified independence;sit to/from stand (with LRAD) Pt Will Transfer to Toilet: with modified independence;ambulating;regular height toilet (with LRAD) Additional ADL Goal #1: Pt will verbalize x3 pain management strategies to incorporate into routine with min verbal cues to support wellbeing and engagement in self care tasks.  OT Frequency: Min 1X/week  Co-evaluation              AM-PAC OT "6 Clicks" Daily Activity     Outcome Measure Help from another person eating meals?: None Help from another person taking care of personal grooming?: None Help from another person toileting, which includes using toliet, bedpan, or urinal?: A Little Help from another person bathing (including washing, rinsing,  drying)?: A Little Help from another person to put on and taking off regular upper body clothing?: None Help from another person to put on and taking off regular lower body clothing?: A Little 6 Click Score: 21   End of Session    Activity Tolerance: Patient tolerated treatment well Patient left: in bed;with call bell/phone within reach;with bed alarm set  OT Visit Diagnosis: Muscle weakness (generalized) (M62.81);Pain Pain - part of body:  (back)                Time: 5009-3818 OT Time Calculation (min): 24 min Charges:  OT General Charges $OT Visit: 1 Visit OT Evaluation $OT Eval Moderate Complexity: 1 Mod OT Treatments $Self Care/Home Management : 8-22 mins  Jeneen Montgomery, OTR/L 07/12/22, 12:22 PM

## 2022-07-13 DIAGNOSIS — R112 Nausea with vomiting, unspecified: Secondary | ICD-10-CM

## 2022-07-13 DIAGNOSIS — E871 Hypo-osmolality and hyponatremia: Secondary | ICD-10-CM

## 2022-07-13 DIAGNOSIS — D649 Anemia, unspecified: Secondary | ICD-10-CM | POA: Diagnosis not present

## 2022-07-13 DIAGNOSIS — C9110 Chronic lymphocytic leukemia of B-cell type not having achieved remission: Secondary | ICD-10-CM | POA: Diagnosis not present

## 2022-07-13 DIAGNOSIS — A419 Sepsis, unspecified organism: Secondary | ICD-10-CM | POA: Diagnosis not present

## 2022-07-13 DIAGNOSIS — N179 Acute kidney failure, unspecified: Secondary | ICD-10-CM | POA: Diagnosis not present

## 2022-07-13 LAB — COMPREHENSIVE METABOLIC PANEL
ALT: 47 U/L — ABNORMAL HIGH (ref 0–44)
AST: 58 U/L — ABNORMAL HIGH (ref 15–41)
Albumin: 2.4 g/dL — ABNORMAL LOW (ref 3.5–5.0)
Alkaline Phosphatase: 79 U/L (ref 38–126)
Anion gap: 7 (ref 5–15)
BUN: 19 mg/dL (ref 8–23)
CO2: 24 mmol/L (ref 22–32)
Calcium: 8.6 mg/dL — ABNORMAL LOW (ref 8.9–10.3)
Chloride: 108 mmol/L (ref 98–111)
Creatinine, Ser: 1.31 mg/dL — ABNORMAL HIGH (ref 0.61–1.24)
GFR, Estimated: 59 mL/min — ABNORMAL LOW (ref >60)
Glucose, Bld: 113 mg/dL — ABNORMAL HIGH (ref 70–99)
Potassium: 4.2 mmol/L (ref 3.5–5.1)
Sodium: 139 mmol/L (ref 135–145)
Total Bilirubin: 0.6 mg/dL (ref 0.3–1.2)
Total Protein: 5.5 g/dL — ABNORMAL LOW (ref 6.5–8.1)

## 2022-07-13 LAB — IRON AND TIBC
Iron: 45 ug/dL (ref 45–182)
Saturation Ratios: 33 % (ref 17.9–39.5)
TIBC: 136 ug/dL — ABNORMAL LOW (ref 250–450)
UIBC: 91 ug/dL

## 2022-07-13 LAB — CBC WITH DIFFERENTIAL/PLATELET
Abs Immature Granulocytes: 0.21 10*3/uL — ABNORMAL HIGH (ref 0.00–0.07)
Basophils Absolute: 0 10*3/uL (ref 0.0–0.1)
Basophils Relative: 0 %
Eosinophils Absolute: 0 10*3/uL (ref 0.0–0.5)
Eosinophils Relative: 0 %
HCT: 24.8 % — ABNORMAL LOW (ref 39.0–52.0)
Hemoglobin: 8 g/dL — ABNORMAL LOW (ref 13.0–17.0)
Immature Granulocytes: 0 %
Lymphocytes Relative: 93 %
Lymphs Abs: 52.4 10*3/uL — ABNORMAL HIGH (ref 0.7–4.0)
MCH: 27.1 pg (ref 26.0–34.0)
MCHC: 32.3 g/dL (ref 30.0–36.0)
MCV: 84.1 fL (ref 80.0–100.0)
Monocytes Absolute: 0.4 10*3/uL (ref 0.1–1.0)
Monocytes Relative: 1 %
Neutro Abs: 3.8 10*3/uL (ref 1.7–7.7)
Neutrophils Relative %: 6 %
Platelets: 240 10*3/uL (ref 150–400)
RBC: 2.95 MIL/uL — ABNORMAL LOW (ref 4.22–5.81)
RDW: 16.3 % — ABNORMAL HIGH (ref 11.5–15.5)
Smear Review: NORMAL
WBC: 56.8 10*3/uL (ref 4.0–10.5)
nRBC: 0 % (ref 0.0–0.2)

## 2022-07-13 LAB — FERRITIN: Ferritin: 1448 ng/mL — ABNORMAL HIGH (ref 24–336)

## 2022-07-13 LAB — URINE CULTURE: Culture: >=100000 — AB

## 2022-07-13 LAB — PROCALCITONIN: Procalcitonin: 16.03 ng/mL

## 2022-07-13 LAB — CULTURE, BLOOD (ROUTINE X 2)
Special Requests: ADEQUATE
Special Requests: ADEQUATE

## 2022-07-13 LAB — HAPTOGLOBIN: Haptoglobin: 436 mg/dL — ABNORMAL HIGH (ref 32–363)

## 2022-07-13 LAB — FOLATE: Folate: 8.9 ng/mL (ref >5.9)

## 2022-07-13 LAB — PATHOLOGIST SMEAR REVIEW

## 2022-07-13 LAB — VITAMIN B12: Vitamin B-12: 1221 pg/mL — ABNORMAL HIGH (ref 180–914)

## 2022-07-13 MED ORDER — LEVOFLOXACIN 500 MG PO TABS: 500.0000 mg | ORAL_TABLET | Freq: Every day | ORAL | 0 refills | Status: AC

## 2022-07-13 NOTE — Progress Notes (Signed)
Patient alert and oriented, vss, no complaints of pain.  Given discharge instructions.  No questions at this time.  D/c telemetry and piv.  To be escorted out of hospital via wheelchair by volunteers.

## 2022-07-13 NOTE — Discharge Summary (Signed)
Physician Discharge Summary   Patient: [name] MRN: [id number] DOB: [birth year]  Admit date:     07/11/2022  Discharge date: 07/13/22  Discharge Physician: Loletha Grayer   PCP: [name], FNP   Recommendations at discharge:   Follow-up PCP 5 days Follow-up oncology 1 week  Discharge Diagnoses: Principal Problem:   Severe sepsis ([place]) Active Problems:   AKI (acute kidney injury) ([place])   Chronic lymphocytic leukemia (CLL), B-cell (HCC)   Diarrhea   Symptomatic anemia   Hyponatremia   Nausea vomiting and diarrhea    Hospital Course: The patient was admitted to the hospital on 07/11/2022 and discharged on 07/13/2022.  Patient came in with nausea vomiting and diarrhea with shaking chills.  He was found to have sepsis.  E. coli growing out of blood and urine cultures.  Since the patient has a allergy to penicillin he was prescribed Levaquin.  The patient did have a fever on 07/12/2022 but was afebrile on 07/13/2022.  The patient wanted to go home.  Since I do not have the sensitivities I felt that it is safe to go home.  He will have 7 more days of Levaquin upon discharge.  Assessment and Plan: * Severe sepsis ([place]) Present on admission, E. coli growing out of blood cultures and urine cultures.  May be urine source.  Patient had fever of 102 and leukocytosis on presentation.  Patient started on Levaquin.  The patient will be since discharged home on Levaquin 500 mg daily for 7 more days.  AKI (acute kidney injury) ([place]) AKI on CKD stage II (baseline creatinine around 1.26).  Creatinine 1.65 on presentation and down to 1.31.  Baseline Creatinine ranging between 1.26 and 1.36 as outpatient.  Chronic lymphocytic leukemia (CLL), B-cell (HCC) White blood cell count very elevated on presentation 143.7 and down to 56.8.  Case discussed with Dr. Tasia Catchings oncology.  Holding Calquence at this time until follow-up with oncology as outpatient  Hyponatremia Sodium normal range and  presented  Symptomatic anemia Last hemoglobin 8.0.  Transfuse 1 unit of packed red blood cells during the hospital course.  Diarrhea Stool studies negative.         Consultants: Oncology Procedures performed: None Disposition: Home Diet recommendation:  Regular diet DISCHARGE MEDICATION: Allergies as of 07/13/2022       Reactions   Penicillins    Other reaction(s): Rash, Hives ()   Latex Rash        Medication List     STOP taking these medications    Calquence 100 MG Tabs Generic drug: acalabrutinib maleate       TAKE these medications    hydrocortisone cream 1 % Apply 1 application. topically 2 (two) times daily.   levofloxacin 500 MG tablet Commonly known as: Levaquin Take 1 tablet (500 mg total) by mouth daily for 7 days. Start taking on: July 14, 2022   loperamide 2 MG capsule Commonly known as: IMODIUM Take 1 capsule (2 mg total) by mouth See admin instructions. Initial: 4 mg, followed by 2 mg after each loose stool; maximum: 16 mg/day   traZODone 50 MG tablet Commonly known as: DESYREL Take 0.5-1 tablets (25-50 mg total) by mouth at bedtime as needed for sleep.        Follow-up Information     Arnett, Yvetta Coder, FNP. Go on 07/22/2022.   Specialty: Family Medicine Why: Appointment on 8/2 at 11:30  please arrive 15 minutes early Contact information: [address] [place] [address]  [phone]         Earlie Server, MD. Go on 07/20/2022.   Specialty: Oncology Why: Appointment at [address] information: [place] [address] [phone]                Discharge Exam: Danley Danker Weights   07/11/22 0542 07/12/22 0458 07/13/22 0452  Weight: 56.7 kg 56.7 kg 61 kg   Physical Exam HENT:     Head: Normocephalic.     Mouth/Throat:     Pharynx: No oropharyngeal exudate.  Eyes:     General: Lids are normal.     Conjunctiva/sclera: Conjunctivae normal.  Cardiovascular:     Rate and Rhythm:  Normal rate and regular rhythm.     Heart sounds: Normal heart sounds, S1 normal and S2 normal.  Pulmonary:     Breath sounds: No decreased breath sounds, wheezing, rhonchi or rales.  Abdominal:     Palpations: Abdomen is soft.     Tenderness: There is no abdominal tenderness.  Musculoskeletal:     Right lower leg: No swelling.     Left lower leg: No swelling.  Skin:    General: Skin is warm.     Findings: No rash.  Neurological:     Mental Status: He is alert and oriented to person, place, and time.      Condition at discharge: stable  The results of significant diagnostics from this hospitalization (including imaging, microbiology, ancillary and laboratory) are listed below for reference.   Imaging Studies: CT ABDOMEN PELVIS W CONTRAST  Result Date: 07/11/2022 CLINICAL DATA:  Abdominal pain with nausea, vomiting and diarrhea. Neutropenic. History of CLL. Currently on chemotherapy. EXAM: CT ABDOMEN AND PELVIS WITH CONTRAST TECHNIQUE: Multidetector CT imaging of the abdomen and pelvis was performed using the standard protocol following bolus administration of intravenous contrast. RADIATION DOSE REDUCTION: This exam was performed according to the departmental dose-optimization program which includes automated exposure control, adjustment of the mA and/or kV according to patient size and/or use of iterative reconstruction technique. CONTRAST:  158m OMNIPAQUE IOHEXOL 300 MG/ML  SOLN COMPARISON:  05/08/2022 FINDINGS: Lower chest: Lung bases are clear. Hepatobiliary: Gallbladder is somewhat contracted. Liver and biliary tree are normal. Pancreas: Normal. Spleen: Normal. Adrenals/Urinary Tract: Adrenal glands are normal. Kidneys are normal in size without hydronephrosis or nephrolithiasis. Possible small cyst over the upper pole left kidney unchanged. Bladder is normal. Stomach/Bowel: Stomach and small bowel are normal. Appendix not well visualized. Colon is decompressed distal to the splenic  flexure. Vascular/Lymphatic: Mild calcified plaque over the abdominal aorta which is normal in caliber. Stent unchanged over the right femoral artery. Known significant bulky adenopathy throughout the abdomen/mesentery and retroperitoneum with mild to moderate overall interval improvement. Improved external iliac chain adenopathy. Reproductive: Unremarkable. Other: Small amount of free fluid over the posterior pelvis which is new. Musculoskeletal: No acute findings. IMPRESSION: 1. No acute findings in the abdomen/pelvis. 2. Known significant bulky adenopathy throughout the abdomen/mesentery and retroperitoneum with mild to moderate overall improvement. Improved external iliac chain adenopathy. Findings compatible with known CLL and ongoing chemotherapy. 3. Small amount of nonspecific free fluid over the posterior pelvis which is new. 4. Aortic atherosclerosis. Aortic Atherosclerosis (ICD10-I70.0). Electronically Signed   By: DMarin OlpM.D.   On: 07/11/2022 09:05   DG Chest 2 View  Result Date: 07/11/2022 CLINICAL DATA:  Shortness of breath EXAM: CHEST - 2 VIEW COMPARISON:  06/15/2022 and prior studies FINDINGS: The cardiomediastinal silhouette is unremarkable. There is no evidence of  focal airspace disease, pulmonary edema, pleural effusion, or pneumothorax. No acute bony abnormalities are identified. Surgical material in the RIGHT axillary region noted. Little interval change since the prior study. IMPRESSION: No active cardiopulmonary disease. Electronically Signed   By: Margarette Canada M.D.   On: 07/11/2022 06:39   ECHOCARDIOGRAM COMPLETE  Result Date: 06/16/2022    ECHOCARDIOGRAM REPORT   Patient Name:   VETO MACQUEEN Date of Exam: 06/16/2022 Medical Rec #:  [id number]    Height:       68.0 in Accession #:    [id number]   Weight:       129.6 lb Date of Birth:  1951-09-21    BSA:          1.700 m Patient Age:    27 years     BP:           151/64 mmHg Patient Gender: M            HR:           43 bpm. Exam  Location:  [facility] Procedure: 2D Echo, Cardiac Doppler and Color Doppler Indications:     R55 Syncope  History:         Patient has no prior history of Echocardiogram examinations.                  Risk Factors:Dyslipidemia.  Sonographer:     Bernadene Person RDCS Referring Phys:  [id number] [facility] AMERY Diagnosing Phys: Nelva Bush MD IMPRESSIONS  1. Left ventricular ejection fraction, by estimation, is 55 to 60%. The left ventricle has normal function. The left ventricle has no regional wall motion abnormalities. Left ventricular diastolic parameters are indeterminate.  2. Right ventricular systolic function is low normal. The right ventricular size is mildly enlarged. There is normal pulmonary artery systolic pressure.  3. Right atrial size was mildly dilated.  4. The mitral valve is normal in structure. Mild mitral valve regurgitation. No evidence of mitral stenosis.  5. The aortic valve is tricuspid. There is mild thickening of the aortic valve. Aortic valve regurgitation is mild. Aortic valve sclerosis is present, with no evidence of aortic valve stenosis.  6. There is borderline dilatation of the ascending aorta, measuring 36 mm.  7. The inferior vena cava is normal in size with greater than 50% respiratory variability, suggesting right atrial pressure of 3 mmHg. FINDINGS  Left Ventricle: Left ventricular ejection fraction, by estimation, is 55 to 60%. The left ventricle has normal function. The left ventricle has no regional wall motion abnormalities. The left ventricular internal cavity size was normal in size. There is  no left ventricular hypertrophy. Left ventricular diastolic parameters are indeterminate. Right Ventricle: The right ventricular size is mildly enlarged. No increase in right ventricular wall thickness. Right ventricular systolic function is low normal. There is normal pulmonary artery systolic pressure. The tricuspid regurgitant velocity is 1.71 m/s, and with an assumed right atrial pressure of  3 mmHg, the estimated right ventricular systolic pressure is 70.0 mmHg. Left Atrium: Left atrial size was normal in size. Right Atrium: Right atrial size was mildly dilated. Pericardium: The pericardium was not well visualized. Mitral Valve: The mitral valve is normal in structure. Mild mitral valve regurgitation. No evidence of mitral valve stenosis. Tricuspid Valve: The tricuspid valve is normal in structure. Tricuspid valve regurgitation is trivial. Aortic Valve: The aortic valve is tricuspid. There is mild thickening of the aortic valve. Aortic valve regurgitation is mild. Aortic regurgitation PHT measures 770 msec. Aortic  valve sclerosis is present, with no evidence of aortic valve stenosis. Pulmonic Valve: The pulmonic valve was normal in structure. Pulmonic valve regurgitation is trivial. No evidence of pulmonic stenosis. Aorta: The aortic root is normal in size and structure. There is borderline dilatation of the ascending aorta, measuring 36 mm. Pulmonary Artery: The pulmonary artery is not well seen. Venous: The inferior vena cava is normal in size with greater than 50% respiratory variability, suggesting right atrial pressure of 3 mmHg. IAS/Shunts: The interatrial septum was not well visualized.  LEFT VENTRICLE PLAX 2D LVIDd:         5.21 cm     Diastology LVIDs:         3.23 cm     LV e' medial:    7.90 cm/s LV PW:         0.69 cm     LV E/e' medial:  6.8 LV IVS:        0.67 cm     LV e' lateral:   9.48 cm/s LVOT diam:     2.10 cm     LV E/e' lateral: 5.7 LV SV:         93 LV SV Index:   55 LVOT Area:     3.46 cm  LV Volumes (MOD) LV vol d, MOD A2C: 95.6 ml LV vol d, MOD A4C: 59.8 ml LV vol s, MOD A2C: 34.2 ml LV vol s, MOD A4C: 23.8 ml LV SV MOD A2C:     61.4 ml LV SV MOD A4C:     59.8 ml LV SV MOD BP:      48.7 ml RIGHT VENTRICLE RV Basal diam:  4.40 cm RV S prime:     9.89 cm/s TAPSE (M-mode): 2.5 cm LEFT ATRIUM           Index        RIGHT ATRIUM           Index LA diam:      3.90 cm 2.29 cm/m   RA  Area:     19.60 cm LA Vol (A2C): 45.3 ml 26.65 ml/m  RA Volume:   53.50 ml  31.47 ml/m LA Vol (A4C): 50.7 ml 29.83 ml/m  AORTIC VALVE LVOT Vmax:   111.00 cm/s LVOT Vmean:  73.500 cm/s LVOT VTI:    0.269 m AI PHT:      770 msec  AORTA Ao Root diam: 3.50 cm Ao Asc diam:  3.60 cm MITRAL VALVE               TRICUSPID VALVE MV Area (PHT): 2.32 cm    TR Peak grad:   11.7 mmHg MV Decel Time: 327 msec    TR Vmax:        171.00 cm/s MV E velocity: 53.90 cm/s MV A velocity: 65.20 cm/s  SHUNTS MV E/A ratio:  0.83        Systemic VTI:  0.27 m                            Systemic Diam: 2.10 cm Nelva Bush MD Electronically signed by Nelva Bush MD Signature Date/Time: 06/16/2022/2:50:44 PM    Final    DG Chest Portable 1 View  Result Date: 06/15/2022 CLINICAL DATA:  Single episode this morning. EXAM: PORTABLE CHEST 1 VIEW COMPARISON:  05/12/2021 FINDINGS: The heart size and mediastinal contours are within normal limits. Both lungs are clear. The visualized skeletal structures are  unremarkable. IMPRESSION: No active disease. Electronically Signed   By: Kathreen Devoid M.D.   On: 06/15/2022 11:20    Microbiology: Results for orders placed or performed during the hospital encounter of 07/11/22  Blood culture (routine x 2)     Status: Abnormal   Collection Time: 07/11/22  5:51 AM   Specimen: BLOOD  Result Value Ref Range Status   Specimen Description   Final    BLOOD BLOOD RIGHT FOREARM Performed at [facility], [address], [place], [place] [address]    Special Requests   Final    BOTTLES DRAWN AEROBIC AND ANAEROBIC Blood Culture adequate volume Performed at [facility], [address] [place], [place] [address]    Culture  Setup Time   Final    Organism ID to follow GRAM NEGATIVE RODS IN BOTH AEROBIC AND ANAEROBIC BOTTLES CRITICAL RESULT CALLED TO, READ BACK BY AND VERIFIED WITH: Molly Maduro PHARMD AT [place] 07/11/2022 DE    Culture ESCHERICHIA COLI (A)  Final    Report Status 07/13/2022 FINAL  Final   Organism ID, Bacteria ESCHERICHIA COLI  Final      Susceptibility   Escherichia coli - MIC*    AMPICILLIN >=32 RESISTANT Resistant     CEFAZOLIN <=4 SENSITIVE Sensitive     CEFEPIME <=0.12 SENSITIVE Sensitive     CEFTAZIDIME <=1 SENSITIVE Sensitive     CEFTRIAXONE <=0.25 SENSITIVE Sensitive     CIPROFLOXACIN <=0.25 SENSITIVE Sensitive     GENTAMICIN <=1 SENSITIVE Sensitive     IMIPENEM <=0.25 SENSITIVE Sensitive     TRIMETH/SULFA <=20 SENSITIVE Sensitive     AMPICILLIN/SULBACTAM >=32 RESISTANT Resistant     PIP/TAZO <=4 SENSITIVE Sensitive     * ESCHERICHIA COLI  Blood Culture ID Panel (Reflexed)     Status: Abnormal   Collection Time: 07/11/22  5:51 AM  Result Value Ref Range Status   Enterococcus faecalis NOT DETECTED NOT DETECTED Final   Enterococcus Faecium NOT DETECTED NOT DETECTED Final   Listeria monocytogenes NOT DETECTED NOT DETECTED Final   Staphylococcus species NOT DETECTED NOT DETECTED Final   Staphylococcus aureus (BCID) NOT DETECTED NOT DETECTED Final   Staphylococcus epidermidis NOT DETECTED NOT DETECTED Final   Staphylococcus lugdunensis NOT DETECTED NOT DETECTED Final   Streptococcus species NOT DETECTED NOT DETECTED Final   Streptococcus agalactiae NOT DETECTED NOT DETECTED Final   Streptococcus pneumoniae NOT DETECTED NOT DETECTED Final   Streptococcus pyogenes NOT DETECTED NOT DETECTED Final   A.calcoaceticus-baumannii NOT DETECTED NOT DETECTED Final   Bacteroides fragilis NOT DETECTED NOT DETECTED Final   Enterobacterales DETECTED (A) NOT DETECTED Final    Comment: Enterobacterales represent a large order of gram negative bacteria, not a single organism. CRITICAL RESULT CALLED TO, READ BACK BY AND VERIFIED WITH: TAMMY BRATHEWITE 07/11/22 1804 DE    Enterobacter cloacae complex NOT DETECTED NOT DETECTED Final   Escherichia coli DETECTED (A) NOT DETECTED Final    Comment: CRITICAL RESULT CALLED TO, READ BACK BY AND  VERIFIED WITH: TAMMY BRATHEWITE 07/11/22 1804 DE    Klebsiella aerogenes NOT DETECTED NOT DETECTED Final   Klebsiella oxytoca NOT DETECTED NOT DETECTED Final   Klebsiella pneumoniae NOT DETECTED NOT DETECTED Final   Proteus species NOT DETECTED NOT DETECTED Final   Salmonella species NOT DETECTED NOT DETECTED Final   Serratia marcescens NOT DETECTED NOT DETECTED Final   Haemophilus influenzae NOT DETECTED NOT DETECTED Final   Neisseria meningitidis NOT DETECTED NOT DETECTED Final   Pseudomonas aeruginosa NOT  DETECTED NOT DETECTED Final   Stenotrophomonas maltophilia NOT DETECTED NOT DETECTED Final   Candida albicans NOT DETECTED NOT DETECTED Final   Candida auris NOT DETECTED NOT DETECTED Final   Candida glabrata NOT DETECTED NOT DETECTED Final   Candida krusei NOT DETECTED NOT DETECTED Final   Candida parapsilosis NOT DETECTED NOT DETECTED Final   Candida tropicalis NOT DETECTED NOT DETECTED Final   Cryptococcus neoformans/gattii NOT DETECTED NOT DETECTED Final   CTX-M ESBL NOT DETECTED NOT DETECTED Final   Carbapenem resistance IMP NOT DETECTED NOT DETECTED Final   Carbapenem resistance KPC NOT DETECTED NOT DETECTED Final   Carbapenem resistance NDM NOT DETECTED NOT DETECTED Final   Carbapenem resist OXA 48 LIKE NOT DETECTED NOT DETECTED Final   Carbapenem resistance VIM NOT DETECTED NOT DETECTED Final    Comment: Performed at [facility], [place], [place] [address]  Resp Panel by RT-PCR (Flu A&B, Covid) Anterior Nasal Swab     Status: None   Collection Time: 07/11/22  6:19 AM   Specimen: Anterior Nasal Swab  Result Value Ref Range Status   SARS Coronavirus 2 by RT PCR NEGATIVE NEGATIVE Final    Comment: (NOTE) SARS-CoV-2 target nucleic acids are NOT DETECTED.  The SARS-CoV-2 RNA is generally detectable in upper respiratory specimens during the acute phase of infection. The lowest concentration of SARS-CoV-2 viral copies this assay can detect is 138  copies/mL. A negative result does not preclude SARS-Cov-2 infection and should not be used as the sole basis for treatment or other patient management decisions. A negative result may occur with  improper specimen collection/handling, submission of specimen other than nasopharyngeal swab, presence of viral mutation(s) within the areas targeted by this assay, and inadequate number of viral copies(<138 copies/mL). A negative result must be combined with clinical observations, patient history, and epidemiological information. The expected result is Negative.  Fact Sheet for Patients:  EntrepreneurPulse.com.au  Fact Sheet for Healthcare Providers:  IncredibleEmployment.be  This test is no t yet approved or cleared by the [place] FDA and  has been authorized for detection and/or diagnosis of SARS-CoV-2 by FDA under an Emergency Use Authorization (EUA). This EUA will remain  in effect (meaning this test can be used) for the duration of the COVID-19 declaration under Section 564(b)(1) of the Act, 21 U.S.C.section 360bbb-3(b)(1), unless the authorization is terminated  or revoked sooner.       Influenza A by PCR NEGATIVE NEGATIVE Final   Influenza B by PCR NEGATIVE NEGATIVE Final    Comment: (NOTE) The Xpert Xpress SARS-CoV-2/FLU/RSV plus assay is intended as an aid in the diagnosis of influenza from Nasopharyngeal swab specimens and should not be used as a sole basis for treatment. Nasal washings and aspirates are unacceptable for Xpert Xpress SARS-CoV-2/FLU/RSV testing.  Fact Sheet for Patients: EntrepreneurPulse.com.au  Fact Sheet for Healthcare Providers: IncredibleEmployment.be  This test is not yet approved or cleared by the [place] FDA and has been authorized for detection and/or diagnosis of SARS-CoV-2 by FDA under an Emergency Use Authorization (EUA). This EUA will remain in effect (meaning  this test can be used) for the duration of the COVID-19 declaration under Section 564(b)(1) of the Act, 21 U.S.C. section 360bbb-3(b)(1), unless the authorization is terminated or revoked.  Performed at [facility], [address] [place], [place] [address]   Blood culture (routine x 2)     Status: Abnormal   Collection Time: 07/11/22  8:09 AM   Specimen: BLOOD  Result Value Ref Range Status   Specimen Description   Final    BLOOD RIGHT ANTECUBITAL Performed at [facility], [address] [place], [place] [address]    Special Requests   Final    BOTTLES DRAWN AEROBIC AND ANAEROBIC Blood Culture adequate volume Performed at [facility], [address] [place], [place] [address]    Culture  Setup Time   Final    IN BOTH AEROBIC AND ANAEROBIC BOTTLES GRAM STAIN REVIEWED-AGREE WITH RESULT IN SINGLES GRAM NEGATIVE RODS CRITICAL RESULT CALLED TO, READ BACK BY AND VERIFIED WITH: Molly Maduro PHARMD [id number] 07/11/2022 DE Performed at [facility], [address] [place], [place] [address]    Culture (A)  Final    ESCHERICHIA COLI SUSCEPTIBILITIES PERFORMED ON PREVIOUS CULTURE WITHIN THE LAST 5 DAYS. Performed at [facility], [place] [address] [place], [place] [address]    Report Status 07/13/2022 FINAL  Final  Urine Culture     Status: Abnormal   Collection Time: 07/11/22  8:51 AM   Specimen: Urine, Clean Catch  Result Value Ref Range Status   Specimen Description   Final    URINE, CLEAN CATCH Performed at [facility], [address] [place], [place] [address]    Special Requests   Final    NONE Performed at [facility], [place],  [address]    Culture >=100,000 COLONIES/mL ESCHERICHIA COLI (A)  Final   Report Status 07/13/2022 FINAL  Final   Organism ID, Bacteria ESCHERICHIA COLI (A)  Final      Susceptibility   Escherichia coli - MIC*    AMPICILLIN >=32 RESISTANT Resistant      CEFAZOLIN <=4 SENSITIVE Sensitive     CEFEPIME <=0.12 SENSITIVE Sensitive     CEFTRIAXONE <=0.25 SENSITIVE Sensitive     CIPROFLOXACIN <=0.25 SENSITIVE Sensitive     GENTAMICIN <=1 SENSITIVE Sensitive     IMIPENEM <=0.25 SENSITIVE Sensitive     NITROFURANTOIN <=16 SENSITIVE Sensitive     TRIMETH/SULFA <=20 SENSITIVE Sensitive     AMPICILLIN/SULBACTAM 16 INTERMEDIATE Intermediate     PIP/TAZO <=4 SENSITIVE Sensitive     * >=100,000 COLONIES/mL ESCHERICHIA COLI  Gastrointestinal Panel by PCR , Stool     Status: None   Collection Time: 07/11/22  1:13 PM   Specimen: Stool  Result Value Ref Range Status   Campylobacter species NOT DETECTED NOT DETECTED Final   Plesimonas shigelloides NOT DETECTED NOT DETECTED Final   Salmonella species NOT DETECTED NOT DETECTED Final   Yersinia enterocolitica NOT DETECTED NOT DETECTED Final   Vibrio species NOT DETECTED NOT DETECTED Final   Vibrio cholerae NOT DETECTED NOT DETECTED Final   Enteroaggregative E coli (EAEC) NOT DETECTED NOT DETECTED Final   Enteropathogenic E coli (EPEC) NOT DETECTED NOT DETECTED Final   Enterotoxigenic E coli (ETEC) NOT DETECTED NOT DETECTED Final   Shiga like toxin producing E coli (STEC) NOT DETECTED NOT DETECTED Final   Shigella/Enteroinvasive E coli (EIEC) NOT DETECTED NOT DETECTED Final   Cryptosporidium NOT DETECTED NOT DETECTED Final   Cyclospora cayetanensis NOT DETECTED NOT DETECTED Final   Entamoeba histolytica NOT DETECTED NOT DETECTED Final   Giardia lamblia NOT DETECTED NOT DETECTED Final   Adenovirus F40/41 NOT DETECTED NOT DETECTED Final   Astrovirus NOT DETECTED NOT DETECTED Final   Norovirus GI/GII NOT DETECTED NOT DETECTED Final   Rotavirus A NOT DETECTED NOT DETECTED Final   Sapovirus (I, II, IV, and V)  NOT DETECTED NOT DETECTED Final    Comment: Performed at [facility], [place], [place] [address]  C Difficile Quick Screen w PCR reflex     Status: None   Collection Time:  07/11/22  1:13 PM   Specimen: STOOL  Result Value Ref Range Status   C Diff antigen NEGATIVE NEGATIVE Final   C Diff toxin NEGATIVE NEGATIVE Final   C Diff interpretation No C. difficile detected.  Final    Comment: Performed at [facility], [address] [place], [place] [address]    Labs: CBC: Recent Labs  Lab 07/11/22 [id number] 07/11/22 1449 07/12/22 0506 07/13/22 0647  WBC 143.7*  145.3*  --  86.4* 56.8*  NEUTROABS 3.7  --  4.3 3.8  HGB 7.2*  7.3* 6.5* 7.2* 8.0*  HCT 25.8*  26.4* 21.9* 23.1* 24.8*  MCV 90.5  91.3  --  84.0 84.1  PLT 258  257  --  206 680   Basic Metabolic Panel: Recent Labs  Lab 07/11/22 0551 07/12/22 0506 07/13/22 0647  NA 136 134* 139  K 3.8 4.1 4.2  CL 107 105 108  CO2 19* 20* 24  GLUCOSE 103* 83 113*  BUN 27* 25* 19  CREATININE 1.65* 1.45* 1.31*  CALCIUM 8.8* 8.5* 8.6*  MG  --  2.4  --   PHOS  --  3.4  --    Liver Function Tests: Recent Labs  Lab 07/11/22 0551 07/12/22 0506 07/13/22 0647  AST 32 27 58*  ALT 20 20 47*  ALKPHOS 118 87 79  BILITOT 0.9 1.2 0.6  PROT 6.9 5.4* 5.5*  ALBUMIN 3.2* 2.5* 2.4*     Discharge time spent: greater than 30 minutes.  Signed: Loletha Grayer, MD [facility] 07/13/2022

## 2022-07-13 NOTE — TOC Initial Note (Signed)
Transition of Care [facility]) - Initial/Assessment Note    Patient Details  Name: [name] MRN: [id number] Date of Birth: 1951/01/22  Transition of Care [facility]) CM/SW Contact:    Laurena Slimmer, RN Phone Number: 07/13/2022, 2:41 PM  Clinical Narrative:                  Transition of Care [facility]) Screening Note   Patient Details  Name: [name] Date of Birth: 06-11-1951   Transition of Care [facility]) CM/SW Contact:    Laurena Slimmer, RN Phone Number: 07/13/2022, 2:41 PM    Transition of Care Department [facility]) has reviewed patient and no TOC needs have been identified at this time. We will continue to monitor patient advancement through interdisciplinary progression rounds. If new patient transition needs arise, please place a TOC consult.          Patient Goals and CMS Choice        Expected Discharge Plan and Services                                                Prior Living Arrangements/Services                       Activities of Daily Living      Permission Sought/Granted                  Emotional Assessment              Admission diagnosis:  Dehydration [E86.0] AKI (acute kidney injury) ([place]) [N17.9] Nausea vomiting and diarrhea [R11.2, R19.7] Symptomatic anemia [D64.9] Urinary tract infection without hematuria, site unspecified [N39.0] Dyspnea, unspecified type [R06.00] Patient Active Problem List   Diagnosis Date Noted   Nausea vomiting and diarrhea    Severe sepsis ([place]) 07/12/2022   AKI (acute kidney injury) ([place]) 07/12/2022   Symptomatic anemia 07/12/2022   Hyponatremia 07/12/2022   Tobacco abuse 06/29/2022   CKD (chronic kidney disease) stage 2, GFR 60-89 ml/min 06/16/2022   Sinus bradycardia 06/16/2022   Syncope, vasovagal 06/15/2022   Syncope 06/15/2022   Encounter for antineoplastic chemotherapy 05/31/2022   Rectal itching 04/29/2022   Insomnia 04/06/2022   Normocytic anemia 02/19/2022   Goals of  care, counseling/discussion 02/19/2022   B12 deficiency 11/18/2021   Bradycardia 04/30/2021   Dizziness 04/30/2021   Left arm swelling 11/26/2019   Corns and callosities 11/23/2019   Corn of toe 11/23/2019   Hav (hallux abducto valgus), unspecified laterality 11/23/2019   Diarrhea 10/23/2019   Pancreatic insufficiency 10/23/2019   Cough 02/03/2019   Erectile dysfunction 07/01/2018   Claudication ([place]) 04/19/2018   Pain in both lower extremities 04/01/2018   History of prediabetes 01/11/2018   Gastroesophageal reflux disease 01/11/2018   Chest pressure 10/28/2017   Depression, recurrent ([place]) 09/29/2017   Encounter for screening for malignant neoplasm of respiratory organs 08/20/2017   Fatigue 07/23/2017   Coronary atherosclerosis 10/14/2016   Emphysema lung ([place]) 10/14/2016   BPH (benign prostatic hyperplasia) 10/14/2016   Peripheral vascular disease ([place]) 10/14/2016   Hyperlipidemia 10/14/2016   Weight loss 06/21/2016   Beta thalassemia minor 06/15/2015   Chronic lymphocytic leukemia (CLL), 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 [place] [address] [place] [address] Phone: [phone] Fax: [phone]  [place] [address] Phone: [phone] Fax: [phone]     Social Determinants of Health (SDOH) Interventions    Readmission Risk Interventions     No data to display

## 2022-07-13 NOTE — Progress Notes (Signed)
Hematology/Oncology Progress note [phone] [phone]     Patient Care Team: [name], FNP as PCP - General (Family Medicine) Rockey Situ Kathlene November, MD as PCP - Cardiology (Cardiology) Bary Castilla, Forest Gleason, MD (General Surgery) Forest Gleason, MD (Unknown Physician Specialty) Earlie Server, MD as Consulting Physician (Oncology)   Name of the patient: [name]  [id number]  October 25, 1951  Date of visit: 07/13/22   INTERVAL HISTORY-   Fever last mid night. This morning afebrile. He feels well. Today is his birthday and he wants to go home if he continues to do well.  He recently started Acarlabrutinib for CLL treatment. During the 2nd week of treatment, he developed diarrhea. Seen by Dr. Janese Banks yesterday. Acarlabrutinib on hold during acute infection setting.     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    Priority: High   Tobacco abuse 06/29/2022    Priority: Medium    Encounter for antineoplastic chemotherapy 05/31/2022    Priority: Medium    Normocytic anemia 02/19/2022    Priority: Medium    Beta thalassemia minor 06/15/2015    Priority: Medium    Goals of care, counseling/discussion 02/19/2022    Priority: Low   Severe sepsis ([place]) 07/12/2022   Acute kidney injury superimposed on CKD ([place]) 07/12/2022   Anemia of chronic disease 07/12/2022   Hyponatremia 07/12/2022   CKD (chronic kidney disease) stage 2, GFR 60-89 ml/min 06/16/2022   Sinus bradycardia 06/16/2022   Syncope, vasovagal 06/15/2022   Syncope 06/15/2022   Rectal itching 04/29/2022   Insomnia 04/06/2022   B12 deficiency 11/18/2021   Bradycardia 04/30/2021   Dizziness 04/30/2021   Left arm swelling 11/26/2019   Corns and callosities 11/23/2019   Corn of toe 11/23/2019   Hav (hallux abducto valgus),  unspecified laterality 11/23/2019   Diarrhea 10/23/2019   Pancreatic insufficiency 10/23/2019   Cough 02/03/2019   Erectile dysfunction 07/01/2018   Claudication ([place]) 04/19/2018   Pain in both lower extremities 04/01/2018   History of prediabetes 01/11/2018   Gastroesophageal reflux disease 01/11/2018   Chest pressure 10/28/2017   Depression, recurrent ([place]) 09/29/2017   Encounter for screening for malignant neoplasm of respiratory organs 08/20/2017   Fatigue 07/23/2017   Coronary atherosclerosis 10/14/2016   Emphysema lung ([place]) 10/14/2016   BPH (benign prostatic hyperplasia) 10/14/2016   Peripheral vascular disease ([place]) 10/14/2016   Hyperlipidemia 10/14/2016   Weight loss 06/21/2016   Iron deficiency anemia, unspecified 06/15/2015   CLL (chronic lymphocytic leukemia) ([place]) 06/15/2015   Seroma 09/18/2014     Past Medical History:  Diagnosis Date   Anemia 2008   Arthritis    Cervicalgia    Chronic lymphocytic leukemia (CLL), 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 Tasia Catchings   Hyperlipidemia    Low back pain    Peripheral vascular disease [facility])      Past Surgical History:  Procedure Laterality Date   COLONOSCOPY     COLONOSCOPY WITH PROPOFOL N/A 07/23/2015   Procedure: COLONOSCOPY WITH PROPOFOL;  Surgeon: [name], MD;  Location: [facility] ENDOSCOPY;  Service: Endoscopy;  Laterality: N/A;   COLONOSCOPY WITH PROPOFOL N/A 04/20/2022   Procedure: COLONOSCOPY WITH PROPOFOL;  Surgeon: Jonathon Bellows, MD;  Location: [facility] ENDOSCOPY;  Service: Gastroenterology;  Laterality: N/A;   ESOPHAGOGASTRODUODENOSCOPY (EGD) WITH PROPOFOL N/A 08/20/2015  Procedure: ESOPHAGOGASTRODUODENOSCOPY (EGD) WITH PROPOFOL;  Surgeon: [name], MD;  Location: [facility] ENDOSCOPY;  Service: Endoscopy;  Laterality: N/A;   LOWER EXTREMITY ANGIOGRAPHY Left 05/19/2018   Procedure: LOWER EXTREMITY ANGIOGRAPHY;  Surgeon: Algernon Huxley, MD;  Location: [place] CV LAB;  Service: Cardiovascular;  Laterality: Left;   LOWER  EXTREMITY ANGIOGRAPHY Right 06/15/2018   Procedure: LOWER EXTREMITY ANGIOGRAPHY;  Surgeon: Algernon Huxley, MD;  Location: [place] CV LAB;  Service: Cardiovascular;  Laterality: Right;   lymp node removal Right 2011   arm   left arm 2015   LYMPH NODE BIOPSY  08/2014    Social History   Socioeconomic History   Marital status: Married    Spouse name: Not on file   Number of children: Not on file   Years of education: Not on file   Highest education level: Not on file  Occupational History   Not on file  Tobacco Use   Smoking status: Former    Types: Cigars   Smokeless tobacco: Never   Tobacco comments:    .5 ppd last year, total of 48.5 pack year  Vaping Use   Vaping Use: Never used  Substance and Sexual Activity   Alcohol use: No   Drug use: No   Sexual activity: Yes  Other Topics Concern   Not on file  Social History Narrative   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    Let go from [facility] 11/2019 unexpectedly   Sons 2, daughters 4   10 grandchildren   Social Determinants of Radio broadcast assistant Strain: Not on file  Food Insecurity: Not on file  Transportation Needs: Not on file  Physical Activity: Not on file  Stress: Not on file  Social Connections: Not on file  Intimate Partner Violence: Not on file     Family History  Problem Relation Age of Onset   Heart attack Father    Kidney cancer Neg Hx        lung cancer   Bladder Cancer Neg Hx    Prostate cancer Neg Hx      Current Facility-Administered Medications:    0.9 %  sodium chloride infusion, 10 mL/hr, Intravenous, Once, Nena Polio, MD   acetaminophen (TYLENOL) tablet 650 mg, 650 mg, Oral, Q6H PRN, Kayleen Memos, DO, 650 mg at 07/13/22 1201   levofloxacin (LEVAQUIN) IVPB 750 mg, 750 mg, Intravenous, Q48H, Noralee Space, RPH, Last Rate: 100 mL/hr at 07/12/22 1119, 750 mg at 07/12/22 1119   melatonin tablet 5 mg, 5 mg, Oral, QHS PRN, Irene Pap N, DO, 5 mg at 07/11/22  2127   ondansetron (ZOFRAN) injection 4 mg, 4 mg, Intravenous, Q6H PRN, [place] Crane, Carole N, DO, 4 mg at 07/11/22 1320   oxyCODONE (Oxy IR/ROXICODONE) immediate release tablet 5 mg, 5 mg, Oral, Q6H PRN, Hall, Carole N, DO, 5 mg at 07/11/22 1226   traZODone (DESYREL) tablet 50 mg, 50 mg, Oral, QHS PRN, Sharion Settler, NP   traZODone (DESYREL) tablet 50 mg, 50 mg, Oral, QHS, Loletha Grayer, MD, 50 mg at 07/12/22 2120   Physical exam:  Vitals:   07/13/22 0200 07/13/22 0452 07/13/22 0806 07/13/22 1215  BP:  118/61 113/65 122/77  Pulse:  (!) 41 (!) 52 (!) 54  Resp: '20 20 20 19  '$ Temp:  97.8 F (36.6 C) 97.9 F (36.6 C) 98 F (36.7 C)  TempSrc:  Oral Oral   SpO2:  100% 100% 100%  Weight:  134 lb 6.4 oz (61 kg)    Height:       Physical Exam Constitutional:      General: He is not in acute distress.    Appearance: He is not diaphoretic.  HENT:     Head: Normocephalic and atraumatic.     Nose: Nose normal.     Mouth/Throat:     Pharynx: No oropharyngeal exudate.  Eyes:     General: No scleral icterus.    Pupils: Pupils are equal, round, and reactive to light.  Cardiovascular:     Rate and Rhythm: Normal rate and regular rhythm.     Heart sounds: No murmur heard. Pulmonary:     Effort: Pulmonary effort is normal. No respiratory distress.     Breath sounds: No rales.  Chest:     Chest wall: No tenderness.  Abdominal:     General: There is no distension.     Palpations: Abdomen is soft.     Tenderness: There is no abdominal tenderness.  Musculoskeletal:        General: Normal range of motion.     Cervical back: Normal range of motion and neck supple.  Skin:    General: Skin is warm and dry.     Findings: No erythema.  Neurological:     Mental Status: He is alert and oriented to person, place, and time.     Cranial Nerves: No cranial nerve deficit.     Motor: No abnormal muscle tone.     Coordination: Coordination normal.  Psychiatric:        Mood and Affect: Affect  normal.           Latest Ref Rng & Units 07/13/2022    6:47 AM  CMP  Glucose 70 - 99 mg/dL 113   BUN 8 - 23 mg/dL 19   Creatinine 0.61 - 1.24 mg/dL 1.31   Sodium 135 - 145 mmol/L 139   Potassium 3.5 - 5.1 mmol/L 4.2   Chloride 98 - 111 mmol/L 108   CO2 22 - 32 mmol/L 24   Calcium 8.9 - 10.3 mg/dL 8.6   Total Protein 6.5 - 8.1 g/dL 5.5   Total Bilirubin 0.3 - 1.2 mg/dL 0.6   Alkaline Phos 38 - 126 U/L 79   AST 15 - 41 U/L 58   ALT 0 - 44 U/L 47       Latest Ref Rng & Units 07/13/2022    6:47 AM  CBC  WBC 4.0 - 10.5 K/uL 56.8   Hemoglobin 13.0 - 17.0 g/dL 8.0   Hematocrit 39.0 - 52.0 % 24.8   Platelets 150 - 400 K/uL 240     RADIOGRAPHIC STUDIES: I have personally reviewed the radiological images as listed and agreed with the findings in the report. CT ABDOMEN PELVIS W CONTRAST  Result Date: 07/11/2022 CLINICAL DATA:  Abdominal pain with nausea, vomiting and diarrhea. Neutropenic. History of CLL. Currently on chemotherapy. EXAM: CT ABDOMEN AND PELVIS WITH CONTRAST TECHNIQUE: Multidetector CT imaging of the abdomen and pelvis was performed using the standard protocol following bolus administration of intravenous contrast. RADIATION DOSE REDUCTION: This exam was performed according to the departmental dose-optimization program which includes automated exposure control, adjustment of the mA and/or kV according to patient size and/or use of iterative reconstruction technique. CONTRAST:  153m OMNIPAQUE IOHEXOL 300 MG/ML  SOLN COMPARISON:  05/08/2022 FINDINGS: Lower chest: Lung bases are clear. Hepatobiliary: Gallbladder is somewhat contracted. Liver and biliary tree are normal.  Pancreas: Normal. Spleen: Normal. Adrenals/Urinary Tract: Adrenal glands are normal. Kidneys are normal in size without hydronephrosis or nephrolithiasis. Possible small cyst over the upper pole left kidney unchanged. Bladder is normal. Stomach/Bowel: Stomach and small bowel are normal. Appendix not well  visualized. Colon is decompressed distal to the splenic flexure. Vascular/Lymphatic: Mild calcified plaque over the abdominal aorta which is normal in caliber. Stent unchanged over the right femoral artery. Known significant bulky adenopathy throughout the abdomen/mesentery and retroperitoneum with mild to moderate overall interval improvement. Improved external iliac chain adenopathy. Reproductive: Unremarkable. Other: Small amount of free fluid over the posterior pelvis which is new. Musculoskeletal: No acute findings. IMPRESSION: 1. No acute findings in the abdomen/pelvis. 2. Known significant bulky adenopathy throughout the abdomen/mesentery and retroperitoneum with mild to moderate overall improvement. Improved external iliac chain adenopathy. Findings compatible with known CLL and ongoing chemotherapy. 3. Small amount of nonspecific free fluid over the posterior pelvis which is new. 4. Aortic atherosclerosis. Aortic Atherosclerosis (ICD10-I70.0). Electronically Signed   By: Marin Olp M.D.   On: 07/11/2022 09:05   DG Chest 2 View  Result Date: 07/11/2022 CLINICAL DATA:  Shortness of breath EXAM: CHEST - 2 VIEW COMPARISON:  06/15/2022 and prior studies FINDINGS: The cardiomediastinal silhouette is unremarkable. There is no evidence of focal airspace disease, pulmonary edema, pleural effusion, or pneumothorax. No acute bony abnormalities are identified. Surgical material in the RIGHT axillary region noted. Little interval change since the prior study. IMPRESSION: No active cardiopulmonary disease. Electronically Signed   By: Margarette Canada M.D.   On: 07/11/2022 06:39   ECHOCARDIOGRAM COMPLETE  Result Date: 06/16/2022    ECHOCARDIOGRAM REPORT   Patient Name:   DREZDEN SEITZINGER Date of Exam: 06/16/2022 Medical Rec #:  [id number]    Height:       68.0 in Accession #:    [id number]   Weight:       129.6 lb Date of Birth:  November 23, 1951    BSA:          1.700 m Patient Age:    37 years     BP:           151/64 mmHg  Patient Gender: M            HR:           43 bpm. Exam Location:  [facility] Procedure: 2D Echo, Cardiac Doppler and Color Doppler Indications:     R55 Syncope  History:         Patient has no prior history of Echocardiogram examinations.                  Risk Factors:Dyslipidemia.  Sonographer:     Bernadene Person RDCS Referring Phys:  [id number] [facility] AMERY Diagnosing Phys: Nelva Bush MD IMPRESSIONS  1. Left ventricular ejection fraction, by estimation, is 55 to 60%. The left ventricle has normal function. The left ventricle has no regional wall motion abnormalities. Left ventricular diastolic parameters are indeterminate.  2. Right ventricular systolic function is low normal. The right ventricular size is mildly enlarged. There is normal pulmonary artery systolic pressure.  3. Right atrial size was mildly dilated.  4. The mitral valve is normal in structure. Mild mitral valve regurgitation. No evidence of mitral stenosis.  5. The aortic valve is tricuspid. There is mild thickening of the aortic valve. Aortic valve regurgitation is mild. Aortic valve sclerosis is present, with no evidence of aortic valve stenosis.  6. There is borderline dilatation of the  ascending aorta, measuring 36 mm.  7. The inferior vena cava is normal in size with greater than 50% respiratory variability, suggesting right atrial pressure of 3 mmHg. FINDINGS  Left Ventricle: Left ventricular ejection fraction, by estimation, is 55 to 60%. The left ventricle has normal function. The left ventricle has no regional wall motion abnormalities. The left ventricular internal cavity size was normal in size. There is  no left ventricular hypertrophy. Left ventricular diastolic parameters are indeterminate. Right Ventricle: The right ventricular size is mildly enlarged. No increase in right ventricular wall thickness. Right ventricular systolic function is low normal. There is normal pulmonary artery systolic pressure. The tricuspid regurgitant velocity  is 1.71 m/s, and with an assumed right atrial pressure of 3 mmHg, the estimated right ventricular systolic pressure is 25.0 mmHg. Left Atrium: Left atrial size was normal in size. Right Atrium: Right atrial size was mildly dilated. Pericardium: The pericardium was not well visualized. Mitral Valve: The mitral valve is normal in structure. Mild mitral valve regurgitation. No evidence of mitral valve stenosis. Tricuspid Valve: The tricuspid valve is normal in structure. Tricuspid valve regurgitation is trivial. Aortic Valve: The aortic valve is tricuspid. There is mild thickening of the aortic valve. Aortic valve regurgitation is mild. Aortic regurgitation PHT measures 770 msec. Aortic valve sclerosis is present, with no evidence of aortic valve stenosis. Pulmonic Valve: The pulmonic valve was normal in structure. Pulmonic valve regurgitation is trivial. No evidence of pulmonic stenosis. Aorta: The aortic root is normal in size and structure. There is borderline dilatation of the ascending aorta, measuring 36 mm. Pulmonary Artery: The pulmonary artery is not well seen. Venous: The inferior vena cava is normal in size with greater than 50% respiratory variability, suggesting right atrial pressure of 3 mmHg. IAS/Shunts: The interatrial septum was not well visualized.  LEFT VENTRICLE PLAX 2D LVIDd:         5.21 cm     Diastology LVIDs:         3.23 cm     LV e' medial:    7.90 cm/s LV PW:         0.69 cm     LV E/e' medial:  6.8 LV IVS:        0.67 cm     LV e' lateral:   9.48 cm/s LVOT diam:     2.10 cm     LV E/e' lateral: 5.7 LV SV:         93 LV SV Index:   55 LVOT Area:     3.46 cm  LV Volumes (MOD) LV vol d, MOD A2C: 95.6 ml LV vol d, MOD A4C: 59.8 ml LV vol s, MOD A2C: 34.2 ml LV vol s, MOD A4C: 23.8 ml LV SV MOD A2C:     61.4 ml LV SV MOD A4C:     59.8 ml LV SV MOD BP:      48.7 ml RIGHT VENTRICLE RV Basal diam:  4.40 cm RV S prime:     9.89 cm/s TAPSE (M-mode): 2.5 cm LEFT ATRIUM           Index        RIGHT  ATRIUM           Index LA diam:      3.90 cm 2.29 cm/m   RA Area:     19.60 cm LA Vol (A2C): 45.3 ml 26.65 ml/m  RA Volume:   53.50 ml  31.47 ml/m LA Vol (A4C): 50.7 ml  29.83 ml/m  AORTIC VALVE LVOT Vmax:   111.00 cm/s LVOT Vmean:  73.500 cm/s LVOT VTI:    0.269 m AI PHT:      770 msec  AORTA Ao Root diam: 3.50 cm Ao Asc diam:  3.60 cm MITRAL VALVE               TRICUSPID VALVE MV Area (PHT): 2.32 cm    TR Peak grad:   11.7 mmHg MV Decel Time: 327 msec    TR Vmax:        171.00 cm/s MV E velocity: 53.90 cm/s MV A velocity: 65.20 cm/s  SHUNTS MV E/A ratio:  0.83        Systemic VTI:  0.27 m                            Systemic Diam: 2.10 cm Nelva Bush MD Electronically signed by Nelva Bush MD Signature Date/Time: 06/16/2022/2:50:44 PM    Final    DG Chest Portable 1 View  Result Date: 06/15/2022 CLINICAL DATA:  Single episode this morning. EXAM: PORTABLE CHEST 1 VIEW COMPARISON:  05/12/2021 FINDINGS: The heart size and mediastinal contours are within normal limits. Both lungs are clear. The visualized skeletal structures are unremarkable. IMPRESSION: No active disease. Electronically Signed   By: Kathreen Devoid M.D.   On: 06/15/2022 11:20    Assessment and plan-   Severe sepsis, UTI with E coli bacteriemia, on Levaquin.   CLL recently started on Acarlabrutinib, hold medication for now during acute infection setting.  Leukocytosis due to CLL/Acarlabrutinib, maybe also due to infection, trending down.  He has outpatient follow up appt with me next week.   Diarrhea is likely medication side effect.   AKI on CKD, creatinine has improved.   Symptomatic anemia, hemolysis work up negative. Adequate iron and folate. B12 pending. s/p PRBC transfusion. Hb improved. Monitor.    Thank you for allowing me to participate in the care of this patient.   Earlie Server, MD, PhD Hematology Oncology  07/13/2022

## 2022-07-14 ENCOUNTER — Ambulatory Visit: Payer: Self-pay | Admitting: *Deleted

## 2022-07-14 ENCOUNTER — Telehealth: Payer: Self-pay

## 2022-07-14 NOTE — Telephone Encounter (Signed)
Transition Care Management Unsuccessful Follow-up Telephone Call  Date of discharge and from where:  07/13/22  Attempts:  1st Attempt  Reason for unsuccessful TCM follow-up call:  Unable to leave message

## 2022-07-14 NOTE — Patient Outreach (Signed)
  Care Coordination [facility] Note Transition Care Management Unsuccessful Follow-up Telephone Call  Date of discharge and from where:  07/13/22 [place]  Attempts:  1st Attempt  Reason for unsuccessful TCM follow-up call:  Unable to leave message  Hubert Azure RN, MSN RN Care Management Coordinator  [place] [phone] [email or web address]

## 2022-07-15 ENCOUNTER — Other Ambulatory Visit: Payer: Self-pay | Admitting: *Deleted

## 2022-07-15 NOTE — Telephone Encounter (Signed)
Transition Care Management Unsuccessful Follow-up Telephone Call  Date of discharge and from where:  07/13/22 [facility]  Attempts:  2nd Attempt  Reason for unsuccessful TCM follow-up call:  Unable to reach patient. No voicemail set up. HFU scheduled 07/22/22 @ 11:30. Keep all scheduled appointments. Will follow as appropriate.

## 2022-07-15 NOTE — Patient Outreach (Signed)
  Care Coordination [facility] Note Transition Care Management Follow-up Telephone Call Date of discharge and from where: 07/13/22 [facility] How have you been since you were released from the hospital? Patient states he is feeling well Any questions or concerns? No  Items Reviewed: Did the pt receive and understand the discharge instructions provided? Yes  Medications obtained and verified? Yes  Other? No  Any new allergies since your discharge? No  Dietary orders reviewed? Yes Do you have support at home? Yes   Home Care and Equipment/Supplies: Were home health services ordered? no If so, what is the name of the agency? N/A  Has the agency set up a time to come to the patient's home? not applicable Were any new equipment or medical supplies ordered?  No What is the name of the medical supply agency? N/A Were you able to get the supplies/equipment? not applicable Do you have any questions related to the use of the equipment or supplies? No  Functional Questionnaire: (I = Independent and D = Dependent) ADLs: I  Bathing/Dressing- I  Meal Prep- I  Eating- I  Maintaining continence- I  Transferring/Ambulation- I  Managing Meds- I  Follow up appointments reviewed:  PCP Hospital f/u appt confirmed? Yes  Scheduled to see Arnett, NP on 07/22/22 @ 1130. [facility] f/u appt confirmed? Yes  Scheduled to see Dr. Tasia Catchings on 07/20/22 @ 1015. Are transportation arrangements needed? No  If their condition worsens, is the pt aware to call PCP or go to the Emergency Dept.? Yes Was the patient provided with contact information for the PCP's office or ED? Yes Was to pt encouraged to call back with questions or concerns? Yes  SDOH assessments and interventions completed:   Yes  Care Coordination Interventions Activated:  No Care Coordination Interventions:   N/A  Encounter Outcome:  Pt. Visit Completed  Emelia Loron RN, BSN [employer] [phone] [email or web address]

## 2022-07-16 NOTE — Telephone Encounter (Signed)
Transition Care Management Follow-up Telephone Call Date of discharge and from where: 07/13/22 [facility] How have you been since you were released from the hospital? Legs and ankles are still swollen since surgery, swelling seems to be worsening.  Elevates feet at bedtime only. He does not wear any socks as part of daily wardrobe. Nurse encourages patient to elevate feet when sitting during daytime as well and increase activity slowly. Taking antibiotic and all medications as directed. Surgical pain as expected. Denies nausea, vomiting,  diarrhea, chills, chest pain, abd pain and all other harmful symptoms. Any questions or concerns? No  Items Reviewed: Did the pt receive and understand the discharge instructions provided? Yes  Medications obtained and verified? Yes  Any new allergies since your discharge? No  Dietary orders reviewed? Yes Do you have support at home? Yes   Home Care and Equipment/Supplies: Were home health services ordered? No  Functional Questionnaire: (I = Independent and D = Dependent) ADLs: I  Bathing/Dressing- I  Meal Prep- I  Eating- I  Maintaining continence- I  Transferring/Ambulation- I  Managing Meds- I  Follow up appointments reviewed:  PCP Hospital f/u appt confirmed? Yes  Scheduled to see PCP on 07/22/22 @ 11:30. Keep all scheduled appointments.  [facility] f/u appt confirmed? Yes  Scheduled to see Oncology on 07/20/22.  Are transportation arrangements needed? No  If their condition worsens, is the pt aware to call PCP or go to the Emergency Dept.? Yes Was the patient provided with contact information for the PCP's office or ED? Yes Was to pt encouraged to call back with questions or concerns? Yes

## 2022-07-17 ENCOUNTER — Other Ambulatory Visit: Payer: Self-pay

## 2022-07-17 DIAGNOSIS — C9110 Chronic lymphocytic leukemia of B-cell type not having achieved remission: Secondary | ICD-10-CM

## 2022-07-20 ENCOUNTER — Inpatient Hospital Stay: Payer: Medicare HMO

## 2022-07-20 ENCOUNTER — Inpatient Hospital Stay: Payer: Medicare HMO | Admitting: Oncology

## 2022-07-20 ENCOUNTER — Encounter: Payer: Self-pay | Admitting: Oncology

## 2022-07-20 VITALS — BP 136/74 | HR 51 | Temp 97.2°F | Resp 18 | Wt 123.9 lb

## 2022-07-20 DIAGNOSIS — J432 Centrilobular emphysema: Secondary | ICD-10-CM | POA: Diagnosis not present

## 2022-07-20 DIAGNOSIS — C9110 Chronic lymphocytic leukemia of B-cell type not having achieved remission: Secondary | ICD-10-CM

## 2022-07-20 DIAGNOSIS — D649 Anemia, unspecified: Secondary | ICD-10-CM | POA: Diagnosis not present

## 2022-07-20 DIAGNOSIS — R5383 Other fatigue: Secondary | ICD-10-CM | POA: Diagnosis not present

## 2022-07-20 DIAGNOSIS — R630 Anorexia: Secondary | ICD-10-CM | POA: Diagnosis not present

## 2022-07-20 DIAGNOSIS — D509 Iron deficiency anemia, unspecified: Secondary | ICD-10-CM | POA: Diagnosis not present

## 2022-07-20 DIAGNOSIS — D561 Beta thalassemia: Secondary | ICD-10-CM | POA: Diagnosis not present

## 2022-07-20 LAB — CBC WITH DIFFERENTIAL/PLATELET
Abs Immature Granulocytes: 0.21 10*3/uL — ABNORMAL HIGH (ref 0.00–0.07)
Basophils Absolute: 0.1 10*3/uL (ref 0.0–0.1)
Basophils Relative: 0 %
Eosinophils Absolute: 0.1 10*3/uL (ref 0.0–0.5)
Eosinophils Relative: 0 %
HCT: 29.5 % — ABNORMAL LOW (ref 39.0–52.0)
Hemoglobin: 8.8 g/dL — ABNORMAL LOW (ref 13.0–17.0)
Immature Granulocytes: 0 %
Lymphocytes Relative: 90 %
Lymphs Abs: 78.5 10*3/uL — ABNORMAL HIGH (ref 0.7–4.0)
MCH: 26.7 pg (ref 26.0–34.0)
MCHC: 29.8 g/dL — ABNORMAL LOW (ref 30.0–36.0)
MCV: 89.7 fL (ref 80.0–100.0)
Monocytes Absolute: 4.2 10*3/uL — ABNORMAL HIGH (ref 0.1–1.0)
Monocytes Relative: 5 %
Neutro Abs: 4.3 10*3/uL (ref 1.7–7.7)
Neutrophils Relative %: 5 %
Platelets: 468 10*3/uL — ABNORMAL HIGH (ref 150–400)
RBC: 3.29 MIL/uL — ABNORMAL LOW (ref 4.22–5.81)
RDW: 17.2 % — ABNORMAL HIGH (ref 11.5–15.5)
Smear Review: NORMAL
WBC: 87.4 10*3/uL (ref 4.0–10.5)
nRBC: 0 % (ref 0.0–0.2)

## 2022-07-20 LAB — HEPATITIS PANEL, ACUTE
HCV Ab: NONREACTIVE
Hep A IgM: NONREACTIVE
Hep B C IgM: NONREACTIVE
Hepatitis B Surface Ag: NONREACTIVE

## 2022-07-20 LAB — COMPREHENSIVE METABOLIC PANEL
ALT: 22 U/L (ref 0–44)
AST: 18 U/L (ref 15–41)
Albumin: 3.2 g/dL — ABNORMAL LOW (ref 3.5–5.0)
Alkaline Phosphatase: 75 U/L (ref 38–126)
Anion gap: 5 (ref 5–15)
BUN: 11 mg/dL (ref 8–23)
CO2: 30 mmol/L (ref 22–32)
Calcium: 9.1 mg/dL (ref 8.9–10.3)
Chloride: 105 mmol/L (ref 98–111)
Creatinine, Ser: 1.25 mg/dL — ABNORMAL HIGH (ref 0.61–1.24)
GFR, Estimated: >60 mL/min (ref >60)
Glucose, Bld: 102 mg/dL — ABNORMAL HIGH (ref 70–99)
Potassium: 4.4 mmol/L (ref 3.5–5.1)
Sodium: 140 mmol/L (ref 135–145)
Total Bilirubin: 0.6 mg/dL (ref 0.3–1.2)
Total Protein: 6.2 g/dL — ABNORMAL LOW (ref 6.5–8.1)

## 2022-07-20 NOTE — Progress Notes (Signed)
Patient here for follow up. Pt is currently on antibiotic for "blood infection"

## 2022-07-20 NOTE — Progress Notes (Signed)
Hematology/Oncology Progress note [phone] [phone]   ASSESSMENT & PLAN:   Chronic lymphocytic leukemia (CLL), B-cell ([place]) He did not Acalabrutinib due to diarrhea. He prefers not to try reduced dose.  Option of switching to Zanubrutinib vs re-challenge with Rituximab which he previously responded well. He elected to try Rituximab.  Plan Rituximab weekly x 4   Normocytic anemia Secondary to CLL with bone marrow involvement Hemoglobin is stable.   Recent severe sepsis Complete course of antibiotics.  Clinically he is doing well.    Orders Placed This Encounter  Procedures   Hepatitis panel, acute    Standing Status:   Future    Number of Occurrences:   1    Standing Expiration Date:   07/21/2023    Follow-up  Rituximab next week. 1 week flex after 1st treatment, lab MD Rituximab.   All questions were answered. The patient knows to call the clinic with any problems, questions or concerns.  Earlie Server, MD, PhD [facility] Hematology Oncology 07/20/2022    Chief Complaint: Star Cheese is a 71 y.o. 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  HPI:  The patient was last seen in the medical onocology clinic on 12/25/2016. Patient reports feeling profound fatigue, lack of appetite, weight loss. He weight 128 pounds today, comparing to 137 pounds when he was here in January 2018.  He met with [name], registered dietitian on 01/04/2017. Patient used to follow up with Dr.[name].   Treatment history: He originally presented with hemolytic anemia in 01/2014.  He was treated with prednisone. With taper of prednisone, his hemolysis returned.I think it's only positives are Per Dr.[name]'s note, patient received 4 weekly cycles of Rituxan (10/02/2014- 10/23/2014).   07/23/2017 Decision was made to start Ibrutinib as patient is very symptomatic (fatigue and weight loss, lack of appetite). Patient did  not show up at the chemo education class. He was never started on Ibrutinib and his constitutional symptoms and is symptoms improved.  We agreed on not to proceed with treatments as it was not clear whether his symptoms are secondary to CLL or not. Patient was recommended for watchful waiting. 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   After visit on 06/03/2018, he lost follow-up  seek second opinion at [facility] and was evaluated by heme-onc Dr.Ellis.  Patient was recommended for watchful waiting.  Patient was seen by [facility] heme-onc in October 2020   Patient reestablish care on 05/07/2020 and lost follow-up again -reestablish care on 02/19/2022 and no-show to a follow-up appointment.  He has slowly progressive microcytic anemia.  Colonoscopy on 07/23/2015 revealed diverticulosis in the sigmoid colon.  EGD on 08/20/2015 was normal. Guaiac cards x 3 were negative in 06/2015.  His diet fluctuates with his appetite.  He denies any melana or hematochezia.  INTERVAL HISTORY 71 y.o. male with history of CLL presents for follow-up. off acalabrutinib 100 mg daily due to diarrhea. Recent hospitalization due to severe sepsis, UTI. He is completing his course of Levaquin today.  Symptomatic anemia, s/p 1 unit of PRBC transfusion.  07/11/22 CT abdomen pelvis w contrast showed Known significant bulky adenopathy throughout the abdomen/mesentery and retroperitoneum with mild to moderate overall improvement. Improved external iliac chain adenopathy. Today he feels well. No new complaints.     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 Tasia Catchings   Hyperlipidemia    Low back pain    Peripheral vascular disease [facility])     Past Surgical History:  Procedure Laterality Date   COLONOSCOPY     COLONOSCOPY WITH PROPOFOL N/A 07/23/2015   Procedure: COLONOSCOPY WITH PROPOFOL;  Surgeon: [name],  MD;  Location: [facility] ENDOSCOPY;  Service: Endoscopy;  Laterality: N/A;   COLONOSCOPY WITH PROPOFOL N/A 04/20/2022   Procedure: COLONOSCOPY WITH PROPOFOL;  Surgeon: Jonathon Bellows, MD;  Location: [facility] ENDOSCOPY;  Service: Gastroenterology;  Laterality: N/A;   ESOPHAGOGASTRODUODENOSCOPY (EGD) WITH PROPOFOL N/A 08/20/2015   Procedure: ESOPHAGOGASTRODUODENOSCOPY (EGD) WITH PROPOFOL;  Surgeon: [name], MD;  Location: [facility] ENDOSCOPY;  Service: Endoscopy;  Laterality: N/A;   LOWER EXTREMITY ANGIOGRAPHY Left 05/19/2018   Procedure: LOWER EXTREMITY ANGIOGRAPHY;  Surgeon: Algernon Huxley, MD;  Location: [place] CV LAB;  Service: Cardiovascular;  Laterality: Left;   LOWER EXTREMITY ANGIOGRAPHY Right 06/15/2018   Procedure: LOWER EXTREMITY ANGIOGRAPHY;  Surgeon: Algernon Huxley, MD;  Location: [place] CV LAB;  Service: Cardiovascular;  Laterality: Right;   lymp node removal Right 2011   arm   left arm 2015   LYMPH NODE BIOPSY  08/2014    Family History  Problem Relation Age of Onset   Heart attack Father    Kidney cancer Neg Hx        lung cancer   Bladder Cancer Neg Hx    Prostate cancer Neg Hx     Social History:  reports that he has quit smoking. His smoking use included cigars. He has never used smokeless tobacco. He reports that he does not drink alcohol and does not use drugs.  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  Allergies:  Allergies  Allergen Reactions   Penicillins     Other reaction(s): Rash, Hives ()   Latex Rash   Review of Systems:  Review of Systems  Constitutional:  Positive for malaise/fatigue and weight loss. Negative for chills and fever.  HENT:  Negative for ear discharge, ear pain, nosebleeds and sore throat.   Eyes:  Negative for photophobia, pain and redness.  Respiratory:  Negative for cough, hemoptysis, sputum production, shortness of breath and wheezing.   Cardiovascular:  Negative  for chest pain, palpitations and leg swelling.  Gastrointestinal:  Negative for abdominal pain, blood in stool, constipation, diarrhea, heartburn, nausea and vomiting.  Genitourinary:  Negative for dysuria and frequency.       Swelling of groin area.  Musculoskeletal:  Negative for myalgias and neck pain.  Skin:  Negative for rash.  Neurological:  Negative for dizziness, tingling, tremors, weakness and headaches.  Endo/Heme/Allergies:  Does not bruise/bleed easily.  Psychiatric/Behavioral:  Negative for depression, hallucinations and suicidal ideas.      Current Outpatient Medications on File Prior to Visit  Medication Sig Dispense Refill   hydrocortisone cream 1 % Apply 1 application. topically 2 (two) times daily. 30 g 0   levofloxacin (LEVAQUIN) 500 MG tablet Take 1 tablet (500 mg total) by mouth daily for 7 days. 7 tablet 0   loperamide (IMODIUM) 2 MG capsule Take 1 capsule (2 mg total) by mouth See admin instructions. Initial: 4 mg, followed by 2 mg after each loose stool; maximum: 16 mg/day 30 capsule 0   traZODone (DESYREL) 50 MG tablet Take 0.5-1 tablets (25-50 mg total) by mouth at bedtime as needed  for sleep. 30 tablet 3   No current facility-administered medications on file prior to visit.    Physical Exam: Blood pressure 136/74, pulse (!) 51, temperature (!) 97.2 F (36.2 C), resp. rate 18, weight 123 lb 14.4 oz (56.2 kg). Physical Exam Constitutional:      General: He is not in acute distress.    Appearance: He is not diaphoretic.  HENT:     Head: Normocephalic and atraumatic.     Nose: Nose normal.     Mouth/Throat:     Pharynx: No oropharyngeal exudate.  Eyes:     General: No scleral icterus.    Pupils: Pupils are equal, round, and reactive to light.  Cardiovascular:     Rate and Rhythm: Normal rate and regular rhythm.     Heart sounds: No murmur heard. Pulmonary:     Effort: Pulmonary effort is normal. No respiratory distress.     Breath sounds: No rales.   Chest:     Chest wall: No tenderness.  Abdominal:     General: There is no distension.     Palpations: Abdomen is soft. There is mass.     Tenderness: There is no abdominal tenderness.  Genitourinary:    Comments: Previously palpable left inguinal lymph node, tender Not checked during this encounter. Musculoskeletal:        General: Normal range of motion.     Cervical back: Normal range of motion and neck supple.  Skin:    General: Skin is warm and dry.     Findings: No erythema.  Neurological:     Mental Status: He is alert and oriented to person, place, and time.     Cranial Nerves: No cranial nerve deficit.     Motor: No abnormal muscle tone.     Coordination: Coordination normal.  Psychiatric:        Mood and Affect: Affect normal.     LABORATORY RESULTS. CBC    Component Value Date/Time   WBC 87.4 (HH) 07/20/2022 0955   RBC 3.29 (L) 07/20/2022 0955   HGB 8.8 (L) 07/20/2022 0955   HGB 13.0 03/13/2015 0852   HCT 29.5 (L) 07/20/2022 0955   HCT 40.9 03/13/2015 0852   PLT 468 (H) 07/20/2022 0955   PLT 217 03/13/2015 0852   MCV 89.7 07/20/2022 0955   MCV 78 (L) 03/13/2015 0852   MCH 26.7 07/20/2022 0955   MCHC 29.8 (L) 07/20/2022 0955   RDW 17.2 (H) 07/20/2022 0955   RDW 16.0 (H) 03/13/2015 0852   LYMPHSABS 78.5 (H) 07/20/2022 0955   LYMPHSABS 3.9 (H) 03/13/2015 0852   MONOABS 4.2 (H) 07/20/2022 0955   MONOABS 0.4 03/13/2015 0852   EOSABS 0.1 07/20/2022 0955   EOSABS 0.4 03/13/2015 0852   BASOSABS 0.1 07/20/2022 0955   BASOSABS 0.1 03/13/2015 0852   CMP     Component Value Date/Time   NA 140 07/20/2022 0955   NA 138 10/16/2019 0000   NA 136 03/13/2015 0852   K 4.4 07/20/2022 0955   K 3.9 03/13/2015 0852   CL 105 07/20/2022 0955   CL 104 03/13/2015 0852   CO2 30 07/20/2022 0955   CO2 26 03/13/2015 0852   GLUCOSE 102 (H) 07/20/2022 0955   GLUCOSE 96 03/13/2015 0852   BUN 11 07/20/2022 0955   BUN 14 10/16/2019 0000   BUN 11 03/13/2015 0852    CREATININE 1.25 (H) 07/20/2022 0955   CREATININE 1.17 02/03/2019 1612   CALCIUM 9.1 07/20/2022 0955   CALCIUM  9.3 03/13/2015 0852   PROT 6.2 (L) 07/20/2022 0955   PROT 8.1 03/13/2015 0852   ALBUMIN 3.2 (L) 07/20/2022 0955   ALBUMIN 4.5 03/13/2015 0852   AST 18 07/20/2022 0955   AST 26 03/13/2015 0852   ALT 22 07/20/2022 0955   ALT 12 (L) 03/13/2015 0852   ALKPHOS 75 07/20/2022 0955   ALKPHOS 57 03/13/2015 0852   BILITOT 0.6 07/20/2022 0955   BILITOT 1.2 03/13/2015 0852   GFRNONAA >60 07/20/2022 0955   GFRNONAA >60 03/13/2015 0852   GFRAA >60 04/25/2020 1123   GFRAA >60 03/13/2015 0852   PATHOLOGY:  11/02/2016 Peripheral blood FISH studies revealed 20% of nuclei positive for loss of 1 ATM signal, 63% of nuclei positive for trisomy 12, and 32% of nuclei positive for loss of 1 TP53 signal. Results for CCND1/IGH and 13q were normal.  09/13/2014 Left axillary node biopsy on  confirmed B-cell small lymphocytic lymphoma (B-CLL/SLL)   IMAGE STUDIES CT ABDOMEN PELVIS W CONTRAST  Result Date: 07/11/2022 CLINICAL DATA:  Abdominal pain with nausea, vomiting and diarrhea. Neutropenic. History of CLL. Currently on chemotherapy. EXAM: CT ABDOMEN AND PELVIS WITH CONTRAST TECHNIQUE: Multidetector CT imaging of the abdomen and pelvis was performed using the standard protocol following bolus administration of intravenous contrast. RADIATION DOSE REDUCTION: This exam was performed according to the departmental dose-optimization program which includes automated exposure control, adjustment of the mA and/or kV according to patient size and/or use of iterative reconstruction technique. CONTRAST:  OMNIPAQUE IOHEXOL 300 MG/ML  SOLN COMPARISON:  05/08/2022 FINDINGS: Lower chest: Lung bases are clear. Hepatobiliary: Gallbladder is somewhat contracted. Liver and biliary tree are normal. Pancreas: Normal. Spleen: Normal. Adrenals/Urinary Tract: Adrenal glands are normal. Kidneys are normal in size without  hydronephrosis or nephrolithiasis. Possible small cyst over the upper pole left kidney unchanged. Bladder is normal. Stomach/Bowel: Stomach and small bowel are normal. Appendix not well visualized. Colon is decompressed distal to the splenic flexure. Vascular/Lymphatic: Mild calcified plaque over the abdominal aorta which is normal in caliber. Stent unchanged over the right femoral artery. Known significant bulky adenopathy throughout the abdomen/mesentery and retroperitoneum with mild to moderate overall interval improvement. Improved external iliac chain adenopathy. Reproductive: Unremarkable. Other: Small amount of free fluid over the posterior pelvis which is new. Musculoskeletal: No acute findings. IMPRESSION: 1. No acute findings in the abdomen/pelvis. 2. Known significant bulky adenopathy throughout the abdomen/mesentery and retroperitoneum with mild to moderate overall improvement. Improved external iliac chain adenopathy. Findings compatible with known CLL and ongoing chemotherapy. 3. Small amount of nonspecific free fluid over the posterior pelvis which is new. 4. Aortic atherosclerosis. Aortic Atherosclerosis (ICD10-I70.0). Electronically Signed   By: Elberta Fortis M.D.   On: 07/11/2022 09:05   DG Chest 2 View  Result Date: 07/11/2022 CLINICAL DATA:  Shortness of breath EXAM: CHEST - 2 VIEW COMPARISON:  06/15/2022 and prior studies FINDINGS: The cardiomediastinal silhouette is unremarkable. There is no evidence of focal airspace disease, pulmonary edema, pleural effusion, or pneumothorax. No acute bony abnormalities are identified. Surgical material in the RIGHT axillary region noted. Little interval change since the prior study. IMPRESSION: No active cardiopulmonary disease. Electronically Signed   By: Harmon Pier M.D.   On: 07/11/2022 06:39   ECHOCARDIOGRAM COMPLETE  Result Date: 06/16/2022    ECHOCARDIOGRAM REPORT   Patient Name:   HANSFORD HIRT Date of Exam: 06/16/2022 Medical Rec #:  [id number]     Height:       68.0 in Accession #:  [id number]   Weight:       129.6 lb Date of Birth:  09-Jan-1951    BSA:          1.700 m Patient Age:    71 years     BP:           151/64 mmHg Patient Gender: M            HR:           43 bpm. Exam Location:  [facility] Procedure: 2D Echo, Cardiac Doppler and Color Doppler Indications:     R55 Syncope  History:         Patient has no prior history of Echocardiogram examinations.                  Risk Factors:Dyslipidemia.  Sonographer:     Bernadene Person RDCS Referring Phys:  [id number] [facility] Diagnosing Phys: Nelva Bush MD IMPRESSIONS  1. Left ventricular ejection fraction, by estimation, is 55 to 60%. The left ventricle has normal function. The left ventricle has no regional wall motion abnormalities. Left ventricular diastolic parameters are indeterminate.  2. Right ventricular systolic function is low normal. The right ventricular size is mildly enlarged. There is normal pulmonary artery systolic pressure.  3. Right atrial size was mildly dilated.  4. The mitral valve is normal in structure. Mild mitral valve regurgitation. No evidence of mitral stenosis.  5. The aortic valve is tricuspid. There is mild thickening of the aortic valve. Aortic valve regurgitation is mild. Aortic valve sclerosis is present, with no evidence of aortic valve stenosis.  6. There is borderline dilatation of the ascending aorta, measuring 36 mm.  7. The inferior vena cava is normal in size with greater than 50% respiratory variability, suggesting right atrial pressure of 3 mmHg. FINDINGS  Left Ventricle: Left ventricular ejection fraction, by estimation, is 55 to 60%. The left ventricle has normal function. The left ventricle has no regional wall motion abnormalities. The left ventricular internal cavity size was normal in size. There is  no left ventricular hypertrophy. Left ventricular diastolic parameters are indeterminate. Right Ventricle: The right ventricular size is mildly enlarged. No  increase in right ventricular wall thickness. Right ventricular systolic function is low normal. There is normal pulmonary artery systolic pressure. The tricuspid regurgitant velocity is 1.71 m/s, and with an assumed right atrial pressure of 3 mmHg, the estimated right ventricular systolic pressure is 26.9 mmHg. Left Atrium: Left atrial size was normal in size. Right Atrium: Right atrial size was mildly dilated. Pericardium: The pericardium was not well visualized. Mitral Valve: The mitral valve is normal in structure. Mild mitral valve regurgitation. No evidence of mitral valve stenosis. Tricuspid Valve: The tricuspid valve is normal in structure. Tricuspid valve regurgitation is trivial. Aortic Valve: The aortic valve is tricuspid. There is mild thickening of the aortic valve. Aortic valve regurgitation is mild. Aortic regurgitation PHT measures 770 msec. Aortic valve sclerosis is present, with no evidence of aortic valve stenosis. Pulmonic Valve: The pulmonic valve was normal in structure. Pulmonic valve regurgitation is trivial. No evidence of pulmonic stenosis. Aorta: The aortic root is normal in size and structure. There is borderline dilatation of the ascending aorta, measuring 36 mm. Pulmonary Artery: The pulmonary artery is not well seen. Venous: The inferior vena cava is normal in size with greater than 50% respiratory variability, suggesting right atrial pressure of 3 mmHg. IAS/Shunts: The interatrial septum was not well visualized.  LEFT VENTRICLE PLAX 2D LVIDd:  5.21 cm     Diastology LVIDs:         3.23 cm     LV e' medial:    7.90 cm/s LV PW:         0.69 cm     LV E/e' medial:  6.8 LV IVS:        0.67 cm     LV e' lateral:   9.48 cm/s LVOT diam:     2.10 cm     LV E/e' lateral: 5.7 LV SV:         93 LV SV Index:   55 LVOT Area:     3.46 cm  LV Volumes (MOD) LV vol d, MOD A2C: 95.6 ml LV vol d, MOD A4C: 59.8 ml LV vol s, MOD A2C: 34.2 ml LV vol s, MOD A4C: 23.8 ml LV SV MOD A2C:     61.4 ml LV  SV MOD A4C:     59.8 ml LV SV MOD BP:      48.7 ml RIGHT VENTRICLE RV Basal diam:  4.40 cm RV S prime:     9.89 cm/s TAPSE (M-mode): 2.5 cm LEFT ATRIUM           Index        RIGHT ATRIUM           Index LA diam:      3.90 cm 2.29 cm/m   RA Area:     19.60 cm LA Vol (A2C): 45.3 ml 26.65 ml/m  RA Volume:   53.50 ml  31.47 ml/m LA Vol (A4C): 50.7 ml 29.83 ml/m  AORTIC VALVE LVOT Vmax:   111.00 cm/s LVOT Vmean:  73.500 cm/s LVOT VTI:    0.269 m AI PHT:      770 msec  AORTA Ao Root diam: 3.50 cm Ao Asc diam:  3.60 cm MITRAL VALVE               TRICUSPID VALVE MV Area (PHT): 2.32 cm    TR Peak grad:   11.7 mmHg MV Decel Time: 327 msec    TR Vmax:        171.00 cm/s MV E velocity: 53.90 cm/s MV A velocity: 65.20 cm/s  SHUNTS MV E/A ratio:  0.83        Systemic VTI:  0.27 m                            Systemic Diam: 2.10 cm Nelva Bush MD Electronically signed by Nelva Bush MD Signature Date/Time: 06/16/2022/2:50:44 PM    Final    DG Chest Portable 1 View  Result Date: 06/15/2022 CLINICAL DATA:  Single episode this morning. EXAM: PORTABLE CHEST 1 VIEW COMPARISON:  05/12/2021 FINDINGS: The heart size and mediastinal contours are within normal limits. Both lungs are clear. The visualized skeletal structures are unremarkable. IMPRESSION: No active disease. Electronically Signed   By: Kathreen Devoid M.D.   On: 06/15/2022 11:20   [name] CORE BIOPSY (LYMPH NODES)  Result Date: 06/08/2022 INDICATION: CLL. EXAM: ULTRASOUND-GUIDED LEFT INGUINAL LYMPH NODE BIOPSY COMPARISON:  PET-CT, 05/28/2022.  CT CAP, 05/08/2022. MEDICATIONS: None ANESTHESIA/SEDATION: Local anesthetic was administered. COMPLICATIONS: None immediate. TECHNIQUE: Informed written consent was obtained from the patient and/or patient's representative after a discussion of the risks, benefits and alternatives to treatment. Questions regarding the procedure were encouraged and answered. Initial ultrasound scanning demonstrated pathologically-enlarged LEFT  inguinal lymph nodes. An ultrasound image was saved for  documentation purposes. The procedure was planned. A timeout was performed prior to the initiation of the procedure. The operative was prepped and draped in the usual sterile fashion, and a sterile drape was applied covering the operative field. A timeout was performed prior to the initiation of the procedure. Local anesthesia was provided with 1% lidocaine. Under direct ultrasound guidance, an 18 gauge core needle device was utilized to obtain to obtain 3 core needle biopsies of the LEFT inguinal lymph node. The samples were placed in saline and submitted to pathology. The needle was removed and hemostasis was achieved with manual compression. Post procedure scan was negative for significant hematoma. A dressing was placed. The patient tolerated the procedure well without immediate postprocedural complication. IMPRESSION: Successful ultrasound guided biopsy of the enlarged LEFT inguinal lymph node, as above. Michaelle Birks, MD [facility]iology Electronically Signed   By: Michaelle Birks M.D.   On: 06/08/2022 10:35   NM PET Image Restage (PS) Skull Base to Thigh (F-18 FDG)  Result Date: 05/30/2022 CLINICAL DATA:  Subsequent treatment strategy for chronic lymphocytic leukemia. EXAM: NUCLEAR MEDICINE PET SKULL BASE TO THIGH TECHNIQUE: 7.27 mCi F-18 FDG was injected intravenously. Full-ring PET imaging was performed from the skull base to thigh after the radiotracer. CT data was obtained and used for attenuation correction and anatomic localization. Fasting blood glucose: 92 mg/dl COMPARISON:  CT AP 05/08/2022 FINDINGS: Mediastinal blood pool activity: SUV max 1.94 Liver activity: SUV max 2.72. NECK: No hypermetabolic lymph nodes in the neck. Incidental CT findings: none CHEST: Again noted are prominent bilateral axillary lymph nodes. -Index right axillary lymph node adjacent to surgical clips measures 1.2 cm and  has an SUV max 1.49, image 83/2. -Index left axillary lymph node measures 1.1 cm with SUV max 1.52, image 63/2. No enlarged or tracer avid mediastinal, hilar or supraclavicular lymph nodes. No tracer avid pulmonary nodules. There are a few scattered subpleural nodules within the periphery of both upper lobes which are technically too small to reliably characterize measuring up to 4 mm, image 83/2. Incidental CT findings: Aortic atherosclerosis and coronary artery calcifications. Paraseptal and centrilobular emphysema. ABDOMEN/PELVIS: No abnormal tracer uptake identified within the liver, pancreas, or adrenal glands. -The spleen measures 11 cm in cranial caudal dimension and is displaced into the left upper quadrant of the abdomen by the bulky abdominal adenopathy. Mild diffuse increased uptake within the spleen has an SUV max 2.62 which is slightly less than background liver activity. Bulky abdominal adenopathy is again identified as reported on recent CT of the abdomen and pelvis from 05/08/2022. -Dominant nodal conglomeration within the abdomen which encases the mesenteric vasculature measures 18.5 x 9.4 by 22 cm has an SUV max 3.15, image 155/2. Bilateral pelvic adenopathy is identified, including: -Left external iliac lymph node measuring 2.5 cm with SUV max 2.89, image 199/2. -Right pelvic sidewall lymph node measures 1.5 cm with SUV max of 3.3, image 196/2. -Left inguinal lymph node measures 1.2 cm with SUV max of 2.4. Incidental CT findings: Aortic atherosclerotic calcifications. No ascites. SKELETON: No focal hypermetabolic activity to suggest skeletal metastasis. Incidental CT findings: none IMPRESSION: 1. Again seen is massive nodal conglomeration within the abdomen with mildly diffuse increased tracer uptake with SUV max of 3.15. There also enlarged bilateral pelvic and left inguinal lymph nodes which exhibit mild increased tracer uptake compatible with residual/recurrent lymphoma. Imaging findings are  compatible with residual/recurrent metabolically active low-grade lymphoma. 2. No significant tracer uptake associated with the borderline  enlarged bilateral axillary lymph nodes. 3. Unchanged appearance of small nonspecific subpleural nodules within the periphery of the upper lobes measuring up to 4 mm. These are too small to characterize by PET-CT. 4. Aortic Atherosclerosis (ICD10-I70.0) and Emphysema (ICD10-J43.9). 5. Coronary artery calcifications. 6. Normal size spleen with background activity similar to the liver. Electronically Signed   By: Kerby Moors M.D.   On: 05/30/2022 11:17   CT CHEST ABDOMEN PELVIS W CONTRAST  Result Date: 05/08/2022 CLINICAL DATA:  CLL, fatigue and weight loss, left groin lymphadenopathy palpated * Tracking Code: BO * EXAM: CT CHEST, ABDOMEN, AND PELVIS WITH CONTRAST TECHNIQUE: Multidetector CT imaging of the chest, abdomen and pelvis was performed following the standard protocol during bolus administration of intravenous contrast. RADIATION DOSE REDUCTION: This exam was performed according to the departmental dose-optimization program which includes automated exposure control, adjustment of the mA and/or kV according to patient size and/or use of iterative reconstruction technique. CONTRAST:  70mL OMNIPAQUE IOHEXOL 300 MG/ML SOLN, additional oral enteric contrast COMPARISON:  CT chest, 06/05/2021, PET-CT, 06/24/2016 FINDINGS: CT CHEST FINDINGS Cardiovascular: No significant vascular findings. Normal heart size. Three-vessel coronary artery calcifications. No pericardial effusion. Mediastinum/Nodes: When compared most recent prior CT examination of the chest dated 06/05/2021, unchanged enlarged bilateral axillary lymph nodes, right axillary nodes measuring up to 1.5 x 1.2 cm (series 2, image 16). Surgical clips in the right axilla. Unchanged prominent pretracheal nodes measuring up to 1.4 x 1.3 cm (series 2, image 26). Thyroid gland, trachea, and esophagus demonstrate no  significant findings. Lungs/Pleura: Mild centrilobular and paraseptal emphysema. Stable, benign small subpleural pulmonary nodules, for example a 0.5 cm nodule of the lingula (series 3, image 104). No further follow-up or characterization is required. No pleural effusion or pneumothorax. Musculoskeletal: No chest wall mass or suspicious osseous lesions identified. CT ABDOMEN PELVIS FINDINGS Hepatobiliary: No solid liver abnormality is seen. No gallstones, gallbladder wall thickening, or biliary dilatation. Pancreas: Unremarkable. No pancreatic ductal dilatation or surrounding inflammatory changes. Spleen: Normal in size without significant abnormality. Adrenals/Urinary Tract: Adrenal glands are unremarkable. Mild right hydronephrosis, the proximal ureter appears compressed or obstructed by overlying lymphadenopathy. No left-sided hydronephrosis. No calculi or renal mass. Bladder is unremarkable. Stomach/Bowel: Stomach is within normal limits. Appendix appears normal. No evidence of bowel wall thickening, distention, or inflammatory changes. Vascular/Lymphatic: Aortic atherosclerosis. When compared to most recent imaging of the abdomen and pelvis dated 06/24/2016, there are numerous, extremely bulky lymph nodes throughout the abdomen and pelvis, which are greatly increased in size. Due to bulky, confluent lymphadenopathy involving the upper abdominal, mesenteric, and retroperitoneal stations, these are difficult to discretely measure, however a large node conglomerate in the ventral abdomen measures at least 14.5 x 6.0 cm (series 2, image 75). Interval enlargement of left iliac/pelvic sidewall lymph nodes, measuring up to 3.4 x 2.9 cm, previously 2.5 x 1.8 cm (series 2, image 106). Newly enlarged left inguinal lymph nodes measuring up to 2.6 x 1.6 cm (series 2, image 111). Reproductive: No mass or other abnormality. Other: No abdominal wall hernia or abnormality. No ascites. Musculoskeletal: No acute osseous  findings. IMPRESSION: 1. When compared to most recent prior CT examination of the chest dated 06/05/2021, unchanged enlarged bilateral axillary lymph nodes and prominent pretracheal mediastinal lymph nodes. 2. However, when compared to most recent prior CT examination of the abdomen and pelvis dated 06/24/2016, there are numerous, extremely bulky lymph nodes throughout the abdomen and pelvis, which are greatly increased in size. Newly enlarged left inguinal lymph nodes, in  keeping with reported palpable lymphadenopathy. 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 3. Mild right hydronephrosis; the proximal ureter appears compressed or obstructed by overlying lymphadenopathy. 4. Emphysema. 5. Coronary artery disease. These results will be called to the ordering clinician or representative by the Radiologist Assistant, and communication documented in the PACS or [employer]. Aortic Atherosclerosis (ICD10-I70.0) and Emphysema (ICD10-J43.9). Electronically Signed   By: Delanna Ahmadi M.D.   On: 05/08/2022 20:00

## 2022-07-20 NOTE — Progress Notes (Signed)
DISCONTINUE ON PATHWAY REGIMEN - Lymphoma and CLL     A cycle is every 28 days:     Acalabrutinib   **Always confirm dose/schedule in your pharmacy ordering system**  REASON: Toxicities / Adverse Event PRIOR TREATMENT: [id number]: Acalabrutinib 100 mg PO BID Until Progression or Unacceptable Toxicity TREATMENT RESPONSE: Unable to Evaluate  START OFF PATHWAY REGIMEN - Lymphoma and CLL   [id number]:Rituximab (Weekly):   Administer weekly:     Rituximab-xxxx   **Always confirm dose/schedule in your pharmacy ordering system**  Patient Characteristics: Chronic Lymphocytic Leukemia (CLL), Treatment Indicated, Second Line Disease Type: Chronic Lymphocytic Leukemia (CLL) Disease Type: Not Applicable Disease Type: Not Applicable Treatment Indicated<= Treatment Indicated Line of Therapy: Second Line Intent of Therapy: Non-Curative / Palliative Intent, Discussed with Patient

## 2022-07-20 NOTE — Assessment & Plan Note (Addendum)
He did not Acalabrutinib due to diarrhea. He prefers not to try reduced dose.  Option of switching to Zanubrutinib vs re-challenge with Rituximab which he previously responded well. He elected to try Rituximab.  Plan Rituximab weekly x 4

## 2022-07-20 NOTE — Assessment & Plan Note (Signed)
Secondary to CLL with bone marrow involvement Hemoglobin is stable.

## 2022-07-21 ENCOUNTER — Other Ambulatory Visit: Payer: Self-pay

## 2022-07-22 ENCOUNTER — Encounter: Payer: Self-pay | Admitting: Family

## 2022-07-22 ENCOUNTER — Ambulatory Visit (INDEPENDENT_AMBULATORY_CARE_PROVIDER_SITE_OTHER): Payer: Medicare HMO | Admitting: Family

## 2022-07-22 VITALS — BP 118/72 | HR 63 | Temp 97.9°F | Ht 68.0 in | Wt 127.0 lb

## 2022-07-22 DIAGNOSIS — Z125 Encounter for screening for malignant neoplasm of prostate: Secondary | ICD-10-CM

## 2022-07-22 DIAGNOSIS — K219 Gastro-esophageal reflux disease without esophagitis: Secondary | ICD-10-CM | POA: Diagnosis not present

## 2022-07-22 DIAGNOSIS — R3911 Hesitancy of micturition: Secondary | ICD-10-CM

## 2022-07-22 LAB — PSA: PSA: 1.56 ng/mL (ref 0.10–4.00)

## 2022-07-22 MED ORDER — PANTOPRAZOLE SODIUM 20 MG PO TBEC: 20.0000 mg | DELAYED_RELEASE_TABLET | Freq: Every day | ORAL | 1 refills | Status: DC

## 2022-07-22 MED ORDER — TAMSULOSIN HCL 0.4 MG PO CAPS: 0.4000 mg | ORAL_CAPSULE | Freq: Every day | ORAL | 1 refills | Status: DC

## 2022-07-22 NOTE — Assessment & Plan Note (Addendum)
Presentation c/w GERD. Colonoscopy is up-to-date.  Repeat in 3 years.  Unable to find EGD report from 04/21/2022.  EGD 08/19/2025 showed normal esophagus. Trial of protonix for a few weeks to see if symptoms improved.  No cholelithiasis noted on CT abdomen and pelvis last month. if no resolution, plan to consult with Dr Vicente Males regarding repeat EGD.  Counseled on the importance of avoidance of NSAIDs.  Advised him to use Tylenol arthritis if needed

## 2022-07-22 NOTE — Assessment & Plan Note (Signed)
Symptoms consistent with prostate etiology.  Discussed benign versus malignant causes.  Trial of Flomax, pending PSA and referral to urology for further evaluation

## 2022-07-22 NOTE — Progress Notes (Signed)
Patient stated that he has congestion and "pressure" around his heart usually after eating...kind of like gas.Wants to get b-12 injection?

## 2022-07-22 NOTE — Progress Notes (Signed)
Subjective:    Patient ID: [name], male    DOB: August 13, 1951, 71 y.o.   MRN: [id number]  CC: [name] is a 71 y.o. male who presents today for follow up.   HPI: Accompanied by wife    No longer having diarrhea, fever.  He has gained 4 pounds.  He is working out [employer].  He is playing basketball.  Denies any shortness of breath or chest pain during physical activity  Today he complains of urinary hesitancy, nocturia which is going on for several months prior to hospitalization.  Urinary frequency has resolved.  He completed Levaquin.    He also complains of epigastric burning which has been going on for several weeks.  Denies chest pain shortness of breath left arm numbness.  He will burp with bitter taste after a meal and burning happens after meal.  Such as when he ate [employer] burger which triggered symptoms.  Endorses nausea and 'almost vomiting' after eating.  No alcohol use.  He will occasionally use ibuprofen.  Patient presented to emergency room 07/11/2022 for nausea and diarrhea chills and bilateral leg weakness.  He was admitted 722 chart 724 for severe sepsis, acute kidney injury, anemia, hyponatremia.  E. coli blood and urine culture.  Fever of 102 and leukocytosis.  Patient started on Levaquin and discharged on Levaquin 500 mg daily for 7 days.  Creatinine 1.31 at discharge.  Status post 1 unit packed red blood cells during hospital course.  Crt 1.25 WBC 87 Hemoglobin 8.8.  Follow up with Dr. Tasia Catchings 06/29/2022 for CLL, anemia, continue rituximab. He is not taking acalbrutinib since hospitalization.   Colonoscopy is up-to-date.  Repeat in 3 years.  Unable to find EGD report from 04/21/2022.  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 Tasia Catchings   Hyperlipidemia    Low back pain    Peripheral vascular disease [facility])     Past Surgical History:  Procedure Laterality Date   COLONOSCOPY     COLONOSCOPY WITH PROPOFOL N/A 07/23/2015   Procedure: COLONOSCOPY WITH PROPOFOL;  Surgeon: [name], MD;  Location: [facility] ENDOSCOPY;  Service: Endoscopy;  Laterality: N/A;   COLONOSCOPY WITH PROPOFOL N/A 04/20/2022   Procedure: COLONOSCOPY WITH PROPOFOL;  Surgeon: Jonathon Bellows, MD;  Location: [facility] ENDOSCOPY;  Service: Gastroenterology;  Laterality: N/A;   ESOPHAGOGASTRODUODENOSCOPY (EGD) WITH PROPOFOL N/A 08/20/2015   Procedure: ESOPHAGOGASTRODUODENOSCOPY (EGD) WITH PROPOFOL;  Surgeon: [name], MD;  Location: [facility] ENDOSCOPY;  Service: Endoscopy;  Laterality: N/A;   LOWER EXTREMITY ANGIOGRAPHY Left 05/19/2018   Procedure: LOWER EXTREMITY ANGIOGRAPHY;  Surgeon: Algernon Huxley, MD;  Location: [place] CV LAB;  Service: Cardiovascular;  Laterality: Left;   LOWER EXTREMITY ANGIOGRAPHY Right 06/15/2018   Procedure: LOWER EXTREMITY ANGIOGRAPHY;  Surgeon: Algernon Huxley, MD;  Location: [place] CV LAB;  Service: Cardiovascular;  Laterality: Right;   lymp node removal Right 2011   arm   left arm 2015   LYMPH NODE BIOPSY  08/2014   Family History  Problem Relation Age of Onset   Heart attack Father    Kidney cancer Neg Hx        lung cancer   Bladder Cancer Neg Hx    Prostate cancer Neg Hx     Allergies: Penicillins and Latex Current Outpatient Medications on File Prior to  Visit  Medication Sig Dispense Refill   hydrocortisone cream 1 % Apply 1 application. topically 2 (two) times daily. 30 g 0   loperamide (IMODIUM) 2 MG capsule Take 1 capsule (2 mg total) by mouth See admin instructions. Initial: 4 mg, followed by 2 mg after each loose stool; maximum: 16 mg/day 30 capsule 0   traZODone (DESYREL) 50 MG tablet Take 0.5-1 tablets (25-50 mg total) by mouth at bedtime as needed for sleep. 30 tablet 3   No current facility-administered medications on file prior to visit.    Social History   Tobacco Use    Smoking status: Former    Types: Cigars   Smokeless tobacco: Never   Tobacco comments:    .5 ppd last year, total of 48.5 pack year  Vaping Use   Vaping Use: Never used  Substance Use Topics   Alcohol use: No   Drug use: No    Review of Systems  Constitutional:  Negative for chills and fever.  Respiratory:  Negative for cough.   Cardiovascular:  Negative for chest pain and palpitations.  Gastrointestinal:  Negative for constipation, diarrhea and vomiting.  Genitourinary:  Positive for difficulty urinating. Negative for frequency.      Objective:    BP 118/72 (BP Location: Left Arm, Patient Position: Sitting, Cuff Size: Normal)   Pulse 63   Temp 97.9 F (36.6 C) (Oral)   Ht '5\' 8"'$  (1.727 m)   Wt 127 lb (57.6 kg)   SpO2 98%   BMI 19.31 kg/m  BP Readings from Last 3 Encounters:  07/22/22 118/72  07/20/22 136/74  07/13/22 129/72   Wt Readings from Last 3 Encounters:  07/22/22 127 lb (57.6 kg)  07/20/22 123 lb 14.4 oz (56.2 kg)  07/13/22 134 lb 6.4 oz (61 kg)    Physical Exam Vitals reviewed.  Constitutional:      Appearance: Normal appearance. He is well-developed.  Cardiovascular:     Rate and Rhythm: Regular rhythm.     Heart sounds: Normal heart sounds.  Pulmonary:     Effort: Pulmonary effort is normal. No respiratory distress.     Breath sounds: Normal breath sounds. No wheezing or rales.  Abdominal:     General: Bowel sounds are normal. There is no distension.     Palpations: Abdomen is soft. Abdomen is not rigid. There is no fluid wave or mass.     Tenderness: There is no abdominal tenderness. There is no guarding or rebound. Negative signs include Murphy's sign and McBurney's sign.  Skin:    General: Skin is warm and dry.  Neurological:     Mental Status: He is alert.  Psychiatric:        Speech: Speech normal.        Behavior: Behavior normal.        Assessment & Plan:   Problem List Items Addressed This Visit       Digestive    Gastroesophageal reflux disease    Presentation c/w GERD. Colonoscopy is up-to-date.  Repeat in 3 years.  Unable to find EGD report from 04/21/2022.  EGD 08/19/2025 showed normal esophagus. Trial of protonix for a few weeks to see if symptoms improved.  No cholelithiasis noted on CT abdomen and pelvis last month. if no resolution, plan to consult with Dr Vicente Males regarding repeat EGD.  Counseled on the importance of avoidance of NSAIDs.  Advised him to use Tylenol arthritis if needed      Relevant Medications   pantoprazole (PROTONIX)  20 MG tablet     Other   Urinary hesitancy - Primary    Symptoms consistent with prostate etiology.  Discussed benign versus malignant causes.  Trial of Flomax, pending PSA and referral to urology for further evaluation      Relevant Medications   tamsulosin (FLOMAX) 0.4 MG CAPS capsule   Other Relevant Orders   Ambulatory referral to Urology   Other Visit Diagnoses     Screening for prostate cancer       Relevant Orders   PSA        I am having Earney Hamburg start on pantoprazole and tamsulosin. I am also having him maintain his traZODone, hydrocortisone cream, and loperamide.   Meds ordered this encounter  Medications   pantoprazole (PROTONIX) 20 MG tablet    Sig: Take 1 tablet (20 mg total) by mouth daily. Take 30 min to 1 hr prior to breakfast. Take for 2-3 weeks    Dispense:  30 tablet    Refill:  1    Order Specific Question:   Supervising Provider    Answer:   Crecencio Mc [[id number]]   tamsulosin (FLOMAX) 0.4 MG CAPS capsule    Sig: Take 1 capsule (0.4 mg total) by mouth daily. Give 30    Dispense:  30 capsule    Refill:  1    Order Specific Question:   Supervising Provider    Answer:   Crecencio Mc [[id number]]    Return precautions given.   Risks, benefits, and alternatives of the medications and treatment plan prescribed today were discussed, and patient expressed understanding.   Education regarding symptom management and diagnosis given  to patient on AVS.  Continue to follow with [name], FNP for routine health maintenance.   Earney Hamburg and I agreed with plan.   [name], FNP

## 2022-07-22 NOTE — Patient Instructions (Signed)
Start Protonix, Flomax.  Referral to urology for further evaluation of your prostate.  Please avoid ibuprofen and use Tylenol arthritis instead as needed

## 2022-07-23 ENCOUNTER — Other Ambulatory Visit (HOSPITAL_COMMUNITY): Payer: Self-pay

## 2022-07-23 ENCOUNTER — Other Ambulatory Visit: Payer: Self-pay

## 2022-07-27 ENCOUNTER — Inpatient Hospital Stay: Payer: Medicare HMO | Attending: Oncology

## 2022-07-27 VITALS — BP 127/72 | HR 57 | Temp 96.0°F | Resp 18 | Wt 128.9 lb

## 2022-07-27 DIAGNOSIS — C9110 Chronic lymphocytic leukemia of B-cell type not having achieved remission: Secondary | ICD-10-CM | POA: Insufficient documentation

## 2022-07-27 DIAGNOSIS — Z5112 Encounter for antineoplastic immunotherapy: Secondary | ICD-10-CM | POA: Diagnosis not present

## 2022-07-27 DIAGNOSIS — D649 Anemia, unspecified: Secondary | ICD-10-CM | POA: Insufficient documentation

## 2022-07-27 MED ORDER — SODIUM CHLORIDE 0.9 % IV SOLN
Freq: Once | INTRAVENOUS | Status: AC
  Filled 2022-07-27: qty 250

## 2022-07-27 MED ORDER — DIPHENHYDRAMINE HCL 25 MG PO CAPS: 50.0000 mg | ORAL_CAPSULE | Freq: Once | ORAL | Status: DC

## 2022-07-27 MED ORDER — SODIUM CHLORIDE 0.9 % IV SOLN: 375.0000 mg/m2 | Freq: Once | INTRAVENOUS | Status: DC

## 2022-07-27 MED ORDER — ACETAMINOPHEN 325 MG PO TABS: 650.0000 mg | ORAL_TABLET | Freq: Once | ORAL | Status: DC

## 2022-07-27 NOTE — Progress Notes (Signed)
Due to drug availability pt unable to receive treatment as scheduled at this time. Pt aware, Per Dr. Tasia Catchings, MD will call pt to discuss plan. Pt verbalizes understanding and agrees with plan.  Pt stable at discharge.

## 2022-07-28 ENCOUNTER — Telehealth: Payer: Self-pay | Admitting: Oncology

## 2022-07-28 NOTE — Telephone Encounter (Signed)
Spoke with pt wife who confirmed upcoming appts.Marland KitchenKJ

## 2022-07-29 ENCOUNTER — Other Ambulatory Visit: Payer: Self-pay

## 2022-07-29 ENCOUNTER — Inpatient Hospital Stay: Payer: Medicare HMO

## 2022-07-29 VITALS — BP 135/72 | HR 57 | Temp 96.9°F | Resp 18 | Ht 68.0 in | Wt 125.7 lb

## 2022-07-29 DIAGNOSIS — D649 Anemia, unspecified: Secondary | ICD-10-CM | POA: Diagnosis not present

## 2022-07-29 DIAGNOSIS — C9110 Chronic lymphocytic leukemia of B-cell type not having achieved remission: Secondary | ICD-10-CM

## 2022-07-29 DIAGNOSIS — Z5112 Encounter for antineoplastic immunotherapy: Secondary | ICD-10-CM | POA: Diagnosis not present

## 2022-07-29 MED ORDER — DIPHENHYDRAMINE HCL 50 MG/ML IJ SOLN
50.0000 mg | Freq: Once | INTRAMUSCULAR | Status: AC | PRN
  Administered 2022-07-29: 50 mg via INTRAVENOUS

## 2022-07-29 MED ORDER — SODIUM CHLORIDE 0.9 % IV SOLN: 8.0000 mg | Freq: Once | INTRAVENOUS | Status: DC

## 2022-07-29 MED ORDER — SODIUM CHLORIDE 0.9 % IV SOLN
Freq: Once | INTRAVENOUS | Status: AC
  Filled 2022-07-29: qty 250

## 2022-07-29 MED ORDER — ONDANSETRON HCL 4 MG/2ML IJ SOLN
8.0000 mg | Freq: Once | INTRAMUSCULAR | Status: AC
  Administered 2022-07-29: 8 mg via INTRAVENOUS

## 2022-07-29 MED ORDER — SODIUM CHLORIDE 0.9 % IV SOLN
375.0000 mg/m2 | Freq: Once | INTRAVENOUS | Status: AC
  Administered 2022-07-29: 600 mg via INTRAVENOUS
  Filled 2022-07-29: qty 10

## 2022-07-29 MED ORDER — MONTELUKAST SODIUM 10 MG PO TABS: 10.0000 mg | ORAL_TABLET | ORAL | 0 refills | Status: DC

## 2022-07-29 MED ORDER — FAMOTIDINE IN NACL 20-0.9 MG/50ML-% IV SOLN: 20.0000 mg | Freq: Once | INTRAVENOUS | Status: DC | PRN

## 2022-07-29 MED ORDER — ALBUTEROL SULFATE (2.5 MG/3ML) 0.083% IN NEBU
2.5000 mg | INHALATION_SOLUTION | Freq: Once | RESPIRATORY_TRACT | Status: DC | PRN
  Filled 2022-07-29: qty 3

## 2022-07-29 MED ORDER — DIPHENHYDRAMINE HCL 25 MG PO CAPS
50.0000 mg | ORAL_CAPSULE | Freq: Once | ORAL | Status: AC
  Administered 2022-07-29: 50 mg via ORAL

## 2022-07-29 MED ORDER — SODIUM CHLORIDE 0.9 % IV SOLN
Freq: Once | INTRAVENOUS | Status: DC | PRN
  Filled 2022-07-29: qty 250

## 2022-07-29 MED ORDER — MEPERIDINE HCL 25 MG/ML IJ SOLN
25.0000 mg | Freq: Once | INTRAMUSCULAR | Status: AC
  Administered 2022-07-29: 25 mg via INTRAVENOUS

## 2022-07-29 MED ORDER — METHYLPREDNISOLONE SODIUM SUCC 125 MG IJ SOLR
125.0000 mg | Freq: Once | INTRAMUSCULAR | Status: AC | PRN
  Administered 2022-07-29: 125 mg via INTRAVENOUS

## 2022-07-29 MED ORDER — EPINEPHRINE 0.3 MG/0.3ML IJ SOAJ: 0.3000 mg | Freq: Once | INTRAMUSCULAR | Status: DC | PRN

## 2022-07-29 MED ORDER — ACETAMINOPHEN 325 MG PO TABS
650.0000 mg | ORAL_TABLET | Freq: Once | ORAL | Status: AC
  Administered 2022-07-29: 650 mg via ORAL

## 2022-07-29 NOTE — Patient Instructions (Signed)
[facility] CANCER CTR AT [place]  Discharge Instructions: Thank you for choosing [facility] to provide your oncology and hematology care.  If you have a lab appointment with the [facility], please go directly to the [facility] and check in at the registration area.  Wear comfortable clothing and clothing appropriate for easy access to any Portacath or PICC line.   We strive to give you quality time with your provider. You may need to reschedule your appointment if you arrive late (15 or more minutes).  Arriving late affects you and other patients whose appointments are after yours.  Also, if you miss three or more appointments without notifying the office, you may be dismissed from the clinic at the provider's discretion.      For prescription refill requests, have your pharmacy contact our office and allow 72 hours for refills to be completed.    Today you received the following chemotherapy and/or immunotherapy agents TRUXIMA      To help prevent nausea and vomiting after your treatment, we encourage you to take your nausea medication as directed.  BELOW ARE SYMPTOMS THAT SHOULD BE REPORTED IMMEDIATELY: *FEVER GREATER THAN 100.4 F (38 C) OR HIGHER *CHILLS OR SWEATING *NAUSEA AND VOMITING THAT IS NOT CONTROLLED WITH YOUR NAUSEA MEDICATION *UNUSUAL SHORTNESS OF BREATH *UNUSUAL BRUISING OR BLEEDING *URINARY PROBLEMS (pain or burning when urinating, or frequent urination) *BOWEL PROBLEMS (unusual diarrhea, constipation, pain near the anus) TENDERNESS IN MOUTH AND THROAT WITH OR WITHOUT PRESENCE OF ULCERS (sore throat, sores in mouth, or a toothache) UNUSUAL RASH, SWELLING OR PAIN  UNUSUAL VAGINAL DISCHARGE OR ITCHING   Items with * indicate a potential emergency and should be followed up as soon as possible or go to the Emergency Department if any problems should occur.  Please show the CHEMOTHERAPY ALERT CARD or IMMUNOTHERAPY ALERT CARD at check-in to the  Emergency Department and triage nurse.  Should you have questions after your visit or need to cancel or reschedule your appointment, please contact [facility] CANCER Standard AT [place]  [phone] and follow the prompts.  Office hours are 8:00 a.m. to 4:30 p.m. Monday - Friday. Please note that voicemails left after 4:00 p.m. may not be returned until the following business day.  We are closed weekends and major holidays. You have access to a nurse at all times for urgent questions. Please call the main number to the clinic [phone] and follow the prompts.  For any non-urgent questions, you may also contact your provider using MyChart. We now offer e-Visits for anyone 25 and older to request care online for non-urgent symptoms. For details visit [email or web address].   Also download the MyChart app! Go to the app store, search "MyChart", open the app, select [place], and log in with your MyChart username and password.  Masks are optional in the cancer centers. If you would like for your care team to wear a mask while they are taking care of you, please let them know. For doctor visits, patients may have with them one support person who is at least 70 years old. At this time, visitors are not allowed in the infusion area.  Rituximab Injection What is this medication? RITUXIMAB (ri TUX i mab) treats leukemia and lymphoma. It works by blocking a protein that causes cancer cells to grow and multiply. This helps to slow or stop the spread of cancer cells. It may also be used to treat autoimmune conditions, such as arthritis. It works by slowing  down an overactive immune system. It is a monoclonal antibody. This medicine may be used for other purposes; ask your health care provider or pharmacist if you have questions. COMMON BRAND NAME(S): RIABNI, Rituxan, RUXIENCE, truxima What should I tell my care team before I take this medication? They need to know if you have any of these  conditions: Chest pain Heart disease Immune system problems Infection, such as chickenpox, cold sores, hepatitis B, herpes Irregular heartbeat or rhythm Kidney disease Low blood counts, such as low white cells, platelets, red cells Lung disease Recent or upcoming vaccine An unusual or allergic reaction to rituximab, other medications, foods, dyes, or preservatives Pregnant or trying to get pregnant Breast-feeding How should I use this medication? This medication is injected into a vein. It is given by a care team in a hospital or clinic setting. A special MedGuide will be given to you before each treatment. Be sure to read this information carefully each time. Talk to your care team about the use of this medication in children. While this medication may be prescribed for children as young as 6 months for selected conditions, precautions do apply. Overdosage: If you think you have taken too much of this medicine contact a poison control center or emergency room at once. NOTE: This medicine is only for you. Do not share this medicine with others. What if I miss a dose? Keep appointments for follow-up doses. It is important not to miss your dose. Call your care team if you are unable to keep an appointment. What may interact with this medication? Do not take this medication with any of the following: Live vaccines This medication may also interact with the following: Cisplatin This list may not describe all possible interactions. Give your health care provider a list of all the medicines, herbs, non-prescription drugs, or dietary supplements you use. Also tell them if you smoke, drink alcohol, or use illegal drugs. Some items may interact with your medicine. What should I watch for while using this medication? Your condition will be monitored carefully while you are receiving this medication. You may need blood work while taking this medication. This medication can cause serious infusion  reactions. To reduce the risk your care team may give you other medications to take before receiving this one. Be sure to follow the directions from your care team. This medication may increase your risk of getting an infection. Call your care team for advice if you get a fever, chills, sore throat, or other symptoms of a cold or flu. Do not treat yourself. Try to avoid being around people who are sick. Call your care team if you are around anyone with measles, chickenpox, or if you develop sores or blisters that do not heal properly. Avoid taking medications that contain aspirin, acetaminophen, ibuprofen, naproxen, or ketoprofen unless instructed by your care team. These medications may hide a fever. This medication may cause serious skin reactions. They can happen weeks to months after starting the medication. Contact your care team right away if you notice fevers or flu-like symptoms with a rash. The rash may be red or purple and then turn into blisters or peeling of the skin. You may also notice a red rash with swelling of the face, lips, or lymph nodes in your neck or under your arms. In some patients, this medication may cause a serious brain infection that may cause death. If you have any problems seeing, thinking, speaking, walking, or standing, tell your care team right away.  If you cannot reach your care team, urgently seek another source of medical care. Talk to your care team if you may be pregnant. Serious birth defects can occur if you take this medication during pregnancy and for 12 months after the last dose. You will need a negative pregnancy test before starting this medication. Contraception is recommended while taking this medication and for 12 months after the last dose. Your care team can help you find the option that works for you. Do not breastfeed while taking this medication and for at least 6 months after the last dose. What side effects may I notice from receiving this  medication? Side effects that you should report to your care team as soon as possible: Allergic reactions or angioedema--skin rash, itching or hives, swelling of the face, eyes, lips, tongue, arms, or legs, trouble swallowing or breathing Bowel blockage--stomach cramping, unable to have a bowel movement or pass gas, loss of appetite, vomiting Dizziness, loss of balance or coordination, confusion or trouble speaking Heart attack--pain or tightness in the chest, shoulders, arms, or jaw, nausea, shortness of breath, cold or clammy skin, feeling faint or lightheaded Heart rhythm changes--fast or irregular heartbeat, dizziness, feeling faint or lightheaded, chest pain, trouble breathing Infection--fever, chills, cough, sore throat, wounds that don't heal, pain or trouble when passing urine, general feeling of discomfort or being unwell Infusion reactions--chest pain, shortness of breath or trouble breathing, feeling faint or lightheaded Kidney injury--decrease in the amount of urine, swelling of the ankles, hands, or feet Liver injury--right upper belly pain, loss of appetite, nausea, light-colored stool, dark yellow or brown urine, yellowing skin or eyes, unusual weakness or fatigue Redness, blistering, peeling, or loosening of the skin, including inside the mouth Stomach pain that is severe, does not go away, or gets worse Tumor lysis syndrome (TLS)--nausea, vomiting, diarrhea, decrease in the amount of urine, dark urine, unusual weakness or fatigue, confusion, muscle pain or cramps, fast or irregular heartbeat, joint pain Side effects that usually do not require medical attention (report to your care team if they continue or are bothersome): Headache Joint pain Nausea Runny or stuffy nose Unusual weakness or fatigue This list may not describe all possible side effects. Call your doctor for medical advice about side effects. You may report side effects to FDA at 1-800-FDA-1088. Where should I keep  my medication? This medication is given in a hospital or clinic. It will not be stored at home. NOTE: This sheet is a summary. It may not cover all possible information. If you have questions about this medicine, talk to your doctor, pharmacist, or health care provider.  2023 Elsevier/Gold Standard (2022-04-27 00:00:00)

## 2022-07-29 NOTE — Progress Notes (Signed)
1014- looked at pt and noticed rigors and SOB,. Med stopped (pt on third tier) called for help. Pt c/o severe back pain 1015- benadryl 50 IV , 1 L NSS wide ope, o2'@1L'$  1016-Solumedrol 125 mg IV , BP 137/94, HR 66, sat 94% 1017- J Borders NP called and at chairside 1018- Pepcid '20mg'$  IVP 1019- BP 166/90 96%  1025 Demerol 25 mg IV 1028- pt states starting to feel better 1020-- nausea 1034- Zofran 8 mg IV 1038- 141/72 HR 65 100% 1040- ambulated to bathroom  Per Josh -finish bolus and then restart at second tier- for 30 min- if tolerates, then continue  1445- pt tolerated re-challenge. Verbalized understanding to pickup Singular and take before next infusion.  Per Dr Tasia Catchings- ok to discharge

## 2022-07-30 ENCOUNTER — Telehealth: Payer: Self-pay | Admitting: Family

## 2022-07-30 ENCOUNTER — Telehealth: Payer: Self-pay | Admitting: Oncology

## 2022-07-30 NOTE — Telephone Encounter (Signed)
There is a hard stop to appt due to another encounter he has that day at 9am , Spoke with pt and notified him that PER AMY and Benjamine Mola due to reaction from treatment yesterday there has been a req to have him scheduled earlier.  Pt communicated that he would reach out to office and notify us when a change to that appt has been made.

## 2022-07-30 NOTE — Telephone Encounter (Signed)
Copied from [place] [id number]. Topic: Medicare AWV >> Jul 30, 2022 11:03 AM Devoria Glassing wrote: Reason for CRM: Called patient to schedule Annual Wellness Visit.  Please schedule with Nurse Health Advisor Denisa O'Brien-Blaney, LPN at [facility]. This appt can be telephone or office visit.  Please call [phone] ask for [facility]

## 2022-08-03 ENCOUNTER — Ambulatory Visit (INDEPENDENT_AMBULATORY_CARE_PROVIDER_SITE_OTHER): Payer: Medicare HMO

## 2022-08-03 ENCOUNTER — Encounter: Payer: Self-pay | Admitting: Urology

## 2022-08-03 ENCOUNTER — Ambulatory Visit: Payer: Self-pay | Admitting: Urology

## 2022-08-03 VITALS — Ht 68.0 in | Wt 125.0 lb

## 2022-08-03 DIAGNOSIS — Z0000 Encounter for general adult medical examination without abnormal findings: Secondary | ICD-10-CM | POA: Diagnosis not present

## 2022-08-03 NOTE — Telephone Encounter (Signed)
Looks like pt has cancelled his other appt on 8/16. Please move up appts, as requested by infsion RN [place]. Please update pt on time.

## 2022-08-03 NOTE — Progress Notes (Signed)
Subjective:   [name] is a 71 y.o. male who presents for an Initial Medicare Annual Wellness Visit.  Review of Systems    No ROS.  Medicare Wellness Virtual Visit.  Visual/audio telehealth visit, UTA vital signs.   See social history for additional risk factors.   Cardiac Risk Factors include: advanced age (>59mn, >>65women);male gender     Objective:    Today's Vitals   08/03/22 1232  Weight: 125 lb (56.7 kg)  Height: '5\' 8"'$  (1.727 m)   Body mass index is 19.01 kg/m.     08/03/2022   12:42 PM 07/29/2022    8:44 AM 07/20/2022   10:07 AM 07/11/2022    5:46 AM 06/29/2022    9:58 AM 06/15/2022   10:17 AM 05/15/2022   11:58 AM  Advanced Directives  Does Patient Have a Medical Advance Directive? No No No No No No No  Would patient like information on creating a medical advance directive? No - Patient declined No - Patient declined No - Patient declined No - Patient declined  No - Patient declined     Current Medications (verified) Outpatient Encounter Medications as of 08/03/2022  Medication Sig   hydrocortisone cream 1 % Apply 1 application. topically 2 (two) times daily.   loperamide (IMODIUM) 2 MG capsule Take 1 capsule (2 mg total) by mouth See admin instructions. Initial: 4 mg, followed by 2 mg after each loose stool; maximum: 16 mg/day   montelukast (SINGULAIR) 10 MG tablet Take 1 tablet (10 mg total) by mouth See admin instructions. Take 1 tablet on the day of next appointment   pantoprazole (PROTONIX) 20 MG tablet Take 1 tablet (20 mg total) by mouth daily. Take 30 min to 1 hr prior to breakfast. Take for 2-3 weeks   tamsulosin (FLOMAX) 0.4 MG CAPS capsule Take 1 capsule (0.4 mg total) by mouth daily. Give 30   traZODone (DESYREL) 50 MG tablet Take 0.5-1 tablets (25-50 mg total) by mouth at bedtime as needed for sleep.   No facility-administered encounter medications on file as of 08/03/2022.    Allergies (verified) Penicillins and Latex   History: Past Medical  History:  Diagnosis Date   Anemia 2008   Arthritis    Cervicalgia    [place] lymph[place] 06[place]15[place]   Dr. YTasia Catchings  Hyperlipidemia    Low back pain    Peripheral vascular disease ([facility]    Past Surgical History:  Procedure Laterality Date   COLONOSCOPY     COLONOSCOPY WITH PROPOFOL N/A 07/23/2015   Procedure: COLONOSCOPY WITH PROPOFOL;  Surgeon: JRobert Bellow MD;  [facility]  Service: Endoscopy;  Laterality: N/A;   COLONOSCOPY WITH PROPOFOL N/A 04/20/2022   Procedure: COLONOSCOPY WITH PROPOFOL;  Surgeon: AJonathon Bellows MD;  [facility];  Service: Gastroenterology;  Laterality: N/A;   ESOPHAGOGASTRODUODENOSCOPY (EGD) WITH PROPOFOL N/A 08/20/2015   Procedure: ESOPHAGOGASTRODUODENOSCOPY (EGD) WITH PROPOFOL;  Surgeon: JRobert Bellow MD;  Location: [facility] ENDOSCOPY;  Service: Endoscopy;  Laterality: N/A;   LOWER EXTREMITY ANGIOGRAPHY Left 05/19/2018   Procedure: LOWER EXTREMITY ANGIOGRAPHY;  Surgeon: DAlgernon Huxley MD;  [facility];  Service: Cardiovascular;  Laterality: Left;   LOWER EXTREMITY ANGIOGRAPHY Right 06/15/2018   Procedure: LOWER EXTREMITY ANGIOGRAPHY;  Surgeon: DAlgernon Huxley MD;  [facility];  Service: Cardiovascular;  Laterality: Right;   lymp node removal Right 2011   arm   left arm 2015   LYMPH NODE  BIOPSY  08/2014   Family History  Problem Relation Age of Onset   Heart attack Father    Kidney cancer Neg Hx        lung cancer   Bladder Cancer Neg Hx    Prostate cancer Neg Hx    Social History   Socioeconomic History   Marital status: Married    Spouse name: Not on file   Number of children: Not on file   Years of education: Not on file   Highest education level: Not on file  Occupational History   Not on file  Tobacco Use   Smoking status: Former    Types: Cigars   Smokeless tobacco: Never   Tobacco comments:    .5 ppd last year, total of 48.5 pack year  Vaping Use    Vaping Use: Never used  Substance and Sexual Activity   Alcohol use: No   Drug use: No   Sexual activity: Yes  Other Topics Concern   Not on file  Social History Narrative   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    Let go from [facility] 11/2019 unexpectedly   Sons 2, daughters 4   10 grandchildren   Social Determinants of Health   Financial Resource Strain: Low Risk  (08/03/2022)   Overall Financial Resource Strain (CARDIA)    Difficulty of Paying Living Expenses: Not hard at all  Food Insecurity: No Food Insecurity (08/03/2022)   Hunger Vital Sign    Worried About Running Out of Food in the Last Year: Never true    Ran Out of Food in the Last Year: Never true  Transportation Needs: No Transportation Needs (08/03/2022)   PRAPARE - Hydrologist (Medical): No    Lack of Transportation (Non-Medical): No  Physical Activity: Sufficiently Active (08/03/2022)   Exercise Vital Sign    Days of Exercise per Week: 7 days    Minutes of Exercise per Session: 30 min  Stress: No Stress Concern Present (08/03/2022)   [place]    Feeling of Stress : Not at all  Social Connections: Unknown (08/03/2022)   Social Connection and Isolation Panel [NHANES]    Frequency of Communication with Friends and Family: Not on file    Frequency of Social Gatherings with Friends and Family: Not on file    Attends Religious Services: Not on file    Active Member of Clubs or Organizations: Not on file    Attends Archivist Meetings: Not on file    Marital Status: Married    Tobacco Counseling Counseling given: Not Answered Tobacco comments: .5 ppd last year, total of 48.5 pack year  Clinical Intake: Pre-visit preparation completed: Yes        Diabetes: No  How often do you need to have someone help you when you read instructions, pamphlets, or other written materials from your doctor or  pharmacy?: 1 - Never   Interpreter Needed?: No    Activities of Daily Living    08/03/2022   12:43 PM 06/15/2022    8:30 PM  In your present state of health, do you have any difficulty performing the following activities:  Hearing? 0 1  Vision? 0 0  Difficulty concentrating or making decisions? 0 0  Walking or climbing stairs? 0 0  Dressing or bathing? 0 0  Doing errands, shopping? 0 0  Preparing Food and eating ? N   Using the  Toilet? N   In the past six months, have you accidently leaked urine? N   Do you have problems with loss of bowel control? N   Managing your Medications? N   Managing your Finances? N   Housekeeping or managing your Housekeeping? N    Patient Care Team: [name], FNP as PCP - General (Family Medicine) Rockey Situ Kathlene November, MD as PCP - Cardiology (Cardiology) Byrnett, Forest Gleason, MD (General Surgery) Forest Gleason, MD (Unknown Physician Specialty) Earlie Server, MD as Consulting Physician (Oncology)  Indicate any recent Medical Services you may have received from other than [facility] providers in the past year (date may be approximate).     Assessment:   This is a routine wellness examination for [name].  Virtual Visit via Telephone Note  I connected with  [name] on 08/03/22 at 12:30 PM EDT by telephone and verified that I am speaking with the correct person using two identifiers.  Location: Patient: home  Provider: office Persons participating in the virtual visit: patient/Nurse Health Advisor   I discussed the limitations of performing an evaluation and management service by telehealth. We continued and completed visit with audio only. Some vital signs may be absent or patient reported.   Hearing/Vision screen Hearing Screening - Comments:: Followed by [facility] Does not currently wear hearing aids Vision Screening - Comments:: Does not wear glasses  Dietary issues and exercise activities discussed: Current Exercise Habits: Home  exercise routine, Intensity: Mild Regular diet; fruits Good water intake Daily nutritional shake   Goals Addressed             This Visit's Progress    Maintain Healthy Lifestyle       Stay active Healthy diet       Depression Screen    08/03/2022   12:41 PM 07/22/2022   11:41 AM 07/22/2022   11:25 AM 04/29/2022    2:17 PM 04/06/2022    1:31 PM 01/16/2022   10:36 AM 04/30/2021    4:33 PM  PHQ 2/9 Scores  PHQ - 2 Score 0 0 0 0 0 2 0  PHQ- 9 Score    0 0 11 7    Fall Risk    08/03/2022   12:43 PM 07/22/2022   11:25 AM 04/29/2022    2:17 PM 04/06/2022    1:31 PM 01/16/2022   10:36 AM  [place] in the past year? 0 0 0 0 0  Number falls in past yr: 0 0 0 0 0  Injury with Fall?  0 0 0 0  Risk for fall due to :  No Fall Risks No Fall Risks No Fall Risks No Fall Risks  Follow up Falls evaluation completed Falls evaluation completed Falls evaluation completed Falls evaluation completed Falls evaluation completed   [place]: Home free of loose throw rugs in walkways, pet beds, electrical cords, etc? Yes  Adequate lighting in your home to reduce risk of falls? Yes   ASSISTIVE DEVICES UTILIZED TO PREVENT FALLS: Life alert? No  Use of a cane, walker or w/c? No  Grab bars in the bathroom? Yes   TIMED UP AND GO: Was the test performed? No .   Cognitive Function:  Patient is alert and oriented x3.  100% independent in managing his own medications and finances. Denies difficulty focusing, making decisions, memory loss.      Immunizations Immunization History  Administered Date(s) Administered   Fluad Quad(high Dose  65+) 11/30/2019, 10/22/2020   Influenza,inj,Quad PF,6+ Mos 10/08/2017, 10/10/2018   PFIZER(Purple Top)SARS-COV-2 Vaccination 02/19/2020, 03/20/2020   Pneumococcal Conjugate-13 10/10/2018   Tdap 02/13/2009, 04/01/2018   Pneumococcal vaccine status: Due, Education has been provided regarding the importance of this vaccine.  Advised may receive this vaccine at local pharmacy or Health Dept. Aware to provide a copy of the vaccination record if obtained from local pharmacy or Health Dept. Verbalized acceptance and understanding.  Shingrix Completed?: No.    Education has been provided regarding the importance of this vaccine. Patient has been advised to call insurance company to determine out of pocket expense if they have not yet received this vaccine. Advised may also receive vaccine at local pharmacy or Health Dept. Verbalized acceptance and understanding.  Screening Tests Health Maintenance  Topic Date Due   INFLUENZA VACCINE  07/21/2022   COVID-19 Vaccine (3 - Pfizer risk series) 08/07/2022 (Originally 04/17/2020)   Zoster Vaccines- Shingrix (1 of 2) 11/03/2022 (Originally 07/13/1970)   Pneumonia Vaccine 97+ Years old (2 - PPSV23 or PCV20) 08/04/2023 (Originally 12/05/2018)   COLONOSCOPY (Pts 45-58yr Insurance coverage will need to be confirmed)  04/20/2025   TETANUS/TDAP  04/01/2028   Hepatitis C Screening  Completed   HPV VACCINES  Aged Out   Health Maintenance Health Maintenance Due  Topic Date Due   INFLUENZA VACCINE  07/21/2022   DG Chest 2 View: completed 07/11/22  Vision Screening: Recommended annual ophthalmology exams for early detection of glaucoma and other disorders of the eye.  Dental Screening: Recommended annual dental exams for proper oral hygiene  Community Resource Referral / Chronic Care Management: CRR required this visit?  No   CCM required this visit?  No      Plan:   Keep all routine maintenance appointments.   I have personally reviewed and noted the following in the patient's chart:   Medical and social history Use of alcohol, tobacco or illicit drugs  Current medications and supplements including opioid prescriptions. Patient is not currently taking opioid prescriptions. Functional ability and status Nutritional status Physical activity Advanced directives List of  other physicians Hospitalizations, surgeries, and ER visits in previous 12 months Vitals Screenings to include cognitive, depression, and falls Referrals and appointments  In addition, I have reviewed and discussed with patient certain preventive protocols, quality metrics, and best practice recommendations. A written personalized care plan for preventive services as well as general preventive health recommendations were provided to patient.     OVarney Biles LPN   [id number]

## 2022-08-03 NOTE — Patient Instructions (Addendum)
  [name] , Thank you for taking time to come for your Medicare Wellness Visit. I appreciate your ongoing commitment to your health goals. Please review the following plan we discussed and let me know if I can assist you in the future.   These are the goals we discussed:  Goals      Maintain Healthy Lifestyle     Stay active Healthy diet        This is a list of the screening recommended for you and due dates:  Health Maintenance  Topic Date Due   Flu Shot  07/21/2022   COVID-19 Vaccine (3 - Pfizer risk series) 08/07/2022*   Zoster (Shingles) Vaccine (1 of 2) 11/03/2022*   Pneumonia Vaccine (2 - PPSV23 or PCV20) 08/04/2023*   Colon Cancer Screening  04/20/2025   Tetanus Vaccine  04/01/2028   Hepatitis C Screening: USPSTF Recommendation to screen - Ages 18-79 yo.  Completed   HPV Vaccine  Aged Out  *Topic was postponed. The date shown is not the original due date.

## 2022-08-04 ENCOUNTER — Inpatient Hospital Stay: Payer: Medicare HMO | Admitting: Oncology

## 2022-08-04 ENCOUNTER — Inpatient Hospital Stay: Payer: Medicare HMO

## 2022-08-05 ENCOUNTER — Encounter: Payer: Self-pay | Admitting: Oncology

## 2022-08-05 ENCOUNTER — Ambulatory Visit: Payer: Medicare HMO | Admitting: Family

## 2022-08-05 ENCOUNTER — Inpatient Hospital Stay: Payer: Medicare HMO

## 2022-08-05 ENCOUNTER — Inpatient Hospital Stay: Payer: Medicare HMO | Admitting: Oncology

## 2022-08-05 ENCOUNTER — Other Ambulatory Visit: Payer: Self-pay

## 2022-08-05 ENCOUNTER — Inpatient Hospital Stay (HOSPITAL_BASED_OUTPATIENT_CLINIC_OR_DEPARTMENT_OTHER): Payer: Medicare HMO | Admitting: Oncology

## 2022-08-05 VITALS — BP 122/73 | HR 53

## 2022-08-05 VITALS — BP 114/74 | HR 64 | Temp 96.8°F | Wt 123.0 lb

## 2022-08-05 DIAGNOSIS — Z5111 Encounter for antineoplastic chemotherapy: Secondary | ICD-10-CM

## 2022-08-05 DIAGNOSIS — D649 Anemia, unspecified: Secondary | ICD-10-CM

## 2022-08-05 DIAGNOSIS — Z5112 Encounter for antineoplastic immunotherapy: Secondary | ICD-10-CM | POA: Diagnosis not present

## 2022-08-05 DIAGNOSIS — C9110 Chronic lymphocytic leukemia of B-cell type not having achieved remission: Secondary | ICD-10-CM

## 2022-08-05 LAB — CBC WITH DIFFERENTIAL/PLATELET
Abs Immature Granulocytes: 0.06 10*3/uL (ref 0.00–0.07)
Basophils Absolute: 0.1 10*3/uL (ref 0.0–0.1)
Basophils Relative: 1 %
Eosinophils Absolute: 0.2 10*3/uL (ref 0.0–0.5)
Eosinophils Relative: 2 %
HCT: 32.3 % — ABNORMAL LOW (ref 39.0–52.0)
Hemoglobin: 10 g/dL — ABNORMAL LOW (ref 13.0–17.0)
Immature Granulocytes: 1 %
Lymphocytes Relative: 59 %
Lymphs Abs: 7.8 10*3/uL — ABNORMAL HIGH (ref 0.7–4.0)
MCH: 27.1 pg (ref 26.0–34.0)
MCHC: 31 g/dL (ref 30.0–36.0)
MCV: 87.5 fL (ref 80.0–100.0)
Monocytes Absolute: 2 10*3/uL — ABNORMAL HIGH (ref 0.1–1.0)
Monocytes Relative: 16 %
Neutro Abs: 2.8 10*3/uL (ref 1.7–7.7)
Neutrophils Relative %: 21 %
Platelets: 231 10*3/uL (ref 150–400)
RBC: 3.69 MIL/uL — ABNORMAL LOW (ref 4.22–5.81)
RDW: 17 % — ABNORMAL HIGH (ref 11.5–15.5)
Smear Review: NORMAL
WBC: 13 10*3/uL — ABNORMAL HIGH (ref 4.0–10.5)
nRBC: 0 % (ref 0.0–0.2)

## 2022-08-05 LAB — COMPREHENSIVE METABOLIC PANEL
ALT: 10 U/L (ref 0–44)
AST: 18 U/L (ref 15–41)
Albumin: 3.9 g/dL (ref 3.5–5.0)
Alkaline Phosphatase: 65 U/L (ref 38–126)
Anion gap: 7 (ref 5–15)
BUN: 19 mg/dL (ref 8–23)
CO2: 25 mmol/L (ref 22–32)
Calcium: 9.2 mg/dL (ref 8.9–10.3)
Chloride: 106 mmol/L (ref 98–111)
Creatinine, Ser: 1.34 mg/dL — ABNORMAL HIGH (ref 0.61–1.24)
GFR, Estimated: 57 mL/min — ABNORMAL LOW (ref >60)
Glucose, Bld: 108 mg/dL — ABNORMAL HIGH (ref 70–99)
Potassium: 4.5 mmol/L (ref 3.5–5.1)
Sodium: 138 mmol/L (ref 135–145)
Total Bilirubin: 0.8 mg/dL (ref 0.3–1.2)
Total Protein: 7 g/dL (ref 6.5–8.1)

## 2022-08-05 MED ORDER — ACETAMINOPHEN 325 MG PO TABS
650.0000 mg | ORAL_TABLET | Freq: Once | ORAL | Status: AC
  Administered 2022-08-05: 650 mg via ORAL

## 2022-08-05 MED ORDER — SODIUM CHLORIDE 0.9 % IV SOLN
Freq: Once | INTRAVENOUS | Status: AC
  Filled 2022-08-05: qty 250

## 2022-08-05 MED ORDER — SODIUM CHLORIDE 0.9 % IV SOLN
375.0000 mg/m2 | Freq: Once | INTRAVENOUS | Status: AC
  Administered 2022-08-05: 600 mg via INTRAVENOUS
  Filled 2022-08-05: qty 20

## 2022-08-05 MED ORDER — DIPHENHYDRAMINE HCL 25 MG PO CAPS
50.0000 mg | ORAL_CAPSULE | Freq: Once | ORAL | Status: AC
  Administered 2022-08-05: 50 mg via ORAL

## 2022-08-05 MED ORDER — SODIUM CHLORIDE 0.9 % IV SOLN
10.0000 mg | Freq: Once | INTRAVENOUS | Status: AC
  Administered 2022-08-05: 10 mg via INTRAVENOUS
  Filled 2022-08-05: qty 10

## 2022-08-05 NOTE — Assessment & Plan Note (Signed)
Secondary to CLL with bone marrow involvement Hemoglobin has improved

## 2022-08-05 NOTE — Progress Notes (Signed)
Patient states that he took Singulair prior to coming here this morning.

## 2022-08-05 NOTE — Assessment & Plan Note (Signed)
He did not Acalabrutinib due to diarrhea. He prefers not to try reduced dose.  Plan Rituximab weekly x 4 Patient developed infusion reaction during first rituximab treatment, infusion  was stopped temporarily, patient was given steroids, Benadryl Zofran, with improvement of symptoms and he was able to be started the treatment and finish. I recommend patient to take Singulair 10 mg daily prior and 2 to 3 days after rituximab treatments. He forgot to take Singulair yesterday but he took a singular 10 mg prior to this visit.  He voices understanding about above Singulair recommendation and will follow instructions for next treatments.

## 2022-08-05 NOTE — Progress Notes (Signed)
Hematology/Oncology Progress note [phone] [phone]   ASSESSMENT & PLAN:   Chronic lymphocytic leukemia (CLL), B-cell ([place]) He did not Acalabrutinib due to diarrhea. He prefers not to try reduced dose.  Plan Rituximab weekly x 4 Patient developed infusion reaction during first rituximab treatment, infusion  was stopped temporarily, patient was given steroids, Benadryl Zofran, with improvement of symptoms and he was able to be started the treatment and finish. I recommend patient to take Singulair 10 mg daily prior and 2 to 3 days after rituximab treatments. He forgot to take Singulair yesterday but he took a singular 10 mg prior to this visit.  He voices understanding about above Singulair recommendation and will follow instructions for next treatments.   Encounter for antineoplastic chemotherapy CLL treatment as listed above  Normocytic anemia Secondary to CLL with bone marrow involvement Hemoglobin has improved   Follow-up  Rituximab today. Labs rituximab in 1 week and in 2 weeks. Lab encounter in 5 weeks 2 months, follow-up with lab MD  All questions were answered. The patient knows to call the clinic with any problems, questions or concerns.  Earlie Server, MD, PhD [facility] Hematology Oncology 08/05/2022    Chief Complaint: [name] is a 71 y.o. 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  HPI:  The patient was last seen in the medical onocology clinic on 12/25/2016. Patient reports feeling profound fatigue, lack of appetite, weight loss. He weight 128 pounds today, comparing to 137 pounds when he was here in January 2018.  He met with [name], registered dietitian on 01/04/2017. Patient used to follow up with Dr.[name].   Treatment history: He originally presented with hemolytic anemia in 01/2014.  He was treated with prednisone. With taper of prednisone, his hemolysis returned.I  think it's only positives are Per Dr.[name]'s note, patient received 4 weekly cycles of Rituxan (10/02/2014- 10/23/2014).   07/23/2017 Decision was made to start Ibrutinib as patient is very symptomatic (fatigue and weight loss, lack of appetite). Patient did not show up at the chemo education class. He was never started on Ibrutinib and his constitutional symptoms and is symptoms improved.  We agreed on not to proceed with treatments as it was not clear whether his symptoms are secondary to CLL or not. Patient was recommended for watchful waiting. 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   After visit on 06/03/2018, he lost follow-up  seek second opinion at [facility] and was evaluated by heme-onc Dr.Ellis.  Patient was recommended for watchful waiting.  Patient was seen by [facility] heme-onc in October 2020   Patient reestablish care on 05/07/2020 and lost follow-up again -reestablish care on 02/19/2022 and no-show to a follow-up appointment.  He has slowly progressive microcytic anemia.  Colonoscopy on 07/23/2015 revealed diverticulosis in the sigmoid colon.  EGD on 08/20/2015 was normal. Guaiac cards x 3 were negative in 06/2015.  His diet fluctuates with his appetite.  He denies any melana or hematochezia.  INTERVAL HISTORY 71 y.o. male with history of CLL presents for follow-up. off acalabrutinib 100 mg daily due to diarrhea. Recent hospitalization due to severe sepsis, UTI. He is completing his course of Levaquin today.  Symptomatic anemia, s/p 1 unit of PRBC transfusion.  07/11/22 CT abdomen pelvis w contrast showed Known significant bulky adenopathy throughout the abdomen/mesentery and retroperitoneum with mild to moderate overall improvement. Improved external iliac chain adenopathy. Today he feels well. No new complaints.  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 Tasia Catchings    Hyperlipidemia    Low back pain    Peripheral vascular disease [facility])     Past Surgical History:  Procedure Laterality Date   COLONOSCOPY     COLONOSCOPY WITH PROPOFOL N/A 07/23/2015   Procedure: COLONOSCOPY WITH PROPOFOL;  Surgeon: [name], MD;  Location: [facility] ENDOSCOPY;  Service: Endoscopy;  Laterality: N/A;   COLONOSCOPY WITH PROPOFOL N/A 04/20/2022   Procedure: COLONOSCOPY WITH PROPOFOL;  Surgeon: Jonathon Bellows, MD;  Location: [facility] ENDOSCOPY;  Service: Gastroenterology;  Laterality: N/A;   ESOPHAGOGASTRODUODENOSCOPY (EGD) WITH PROPOFOL N/A 08/20/2015   Procedure: ESOPHAGOGASTRODUODENOSCOPY (EGD) WITH PROPOFOL;  Surgeon: [name], MD;  Location: [facility] ENDOSCOPY;  Service: Endoscopy;  Laterality: N/A;   LOWER EXTREMITY ANGIOGRAPHY Left 05/19/2018   Procedure: LOWER EXTREMITY ANGIOGRAPHY;  Surgeon: Algernon Huxley, MD;  Location: [place] CV LAB;  Service: Cardiovascular;  Laterality: Left;   LOWER EXTREMITY ANGIOGRAPHY Right 06/15/2018   Procedure: LOWER EXTREMITY ANGIOGRAPHY;  Surgeon: Algernon Huxley, MD;  Location: [place] CV LAB;  Service: Cardiovascular;  Laterality: Right;   lymp node removal Right 2011   arm   left arm 2015   LYMPH NODE BIOPSY  08/2014    Family History  Problem Relation Age of Onset   Heart attack Father    Kidney cancer Neg Hx        lung cancer   Bladder Cancer Neg Hx    Prostate cancer Neg Hx     Social History:  reports that he has quit smoking. His smoking use included cigars. He has never used smokeless tobacco. He reports that he does not drink alcohol and does not use drugs.  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  Allergies:  Allergies  Allergen Reactions   Penicillins     Other reaction(s): Rash, Hives ()   Latex Rash   Review of Systems:  Review of Systems  Constitutional:  Positive for malaise/fatigue and weight loss. Negative for chills and  fever.  HENT:  Negative for ear discharge, ear pain, nosebleeds and sore throat.   Eyes:  Negative for photophobia, pain and redness.  Respiratory:  Negative for cough, hemoptysis, sputum production, shortness of breath and wheezing.   Cardiovascular:  Negative for chest pain, palpitations and leg swelling.  Gastrointestinal:  Negative for abdominal pain, blood in stool, constipation, diarrhea, heartburn, nausea and vomiting.  Genitourinary:  Negative for dysuria and frequency.       Swelling of groin area.  Musculoskeletal:  Negative for myalgias and neck pain.  Skin:  Negative for rash.  Neurological:  Negative for dizziness, tingling, tremors, weakness and headaches.  Endo/Heme/Allergies:  Does not bruise/bleed easily.  Psychiatric/Behavioral:  Negative for depression, hallucinations and suicidal ideas.      Current Outpatient Medications on File Prior to Visit  Medication Sig Dispense Refill   hydrocortisone cream 1 % Apply 1 application. topically 2 (two) times daily. 30 g 0   loperamide (IMODIUM) 2 MG capsule Take 1 capsule (2 mg total) by mouth See admin instructions. Initial: 4 mg, followed by 2 mg after each loose stool; maximum: 16 mg/day 30 capsule 0   montelukast (SINGULAIR) 10 MG tablet Take 1 tablet (10 mg total) by mouth  See admin instructions. Take 1 tablet on the day of next appointment 1 tablet 0   pantoprazole (PROTONIX) 20 MG tablet Take 1 tablet (20 mg total) by mouth daily. Take 30 min to 1 hr prior to breakfast. Take for 2-3 weeks 30 tablet 1   tamsulosin (FLOMAX) 0.4 MG CAPS capsule Take 1 capsule (0.4 mg total) by mouth daily. Give 30 30 capsule 1   traZODone (DESYREL) 50 MG tablet Take 0.5-1 tablets (25-50 mg total) by mouth at bedtime as needed for sleep. 30 tablet 3   No current facility-administered medications on file prior to visit.    Physical Exam: Blood pressure 114/74, pulse 64, temperature (!) 96.8 F (36 C), temperature source Tympanic, weight 123 lb  (55.8 kg). Physical Exam Constitutional:      General: He is not in acute distress.    Appearance: He is not diaphoretic.  HENT:     Head: Normocephalic and atraumatic.     Nose: Nose normal.     Mouth/Throat:     Pharynx: No oropharyngeal exudate.  Eyes:     General: No scleral icterus.    Pupils: Pupils are equal, round, and reactive to light.  Cardiovascular:     Rate and Rhythm: Normal rate and regular rhythm.     Heart sounds: No murmur heard. Pulmonary:     Effort: Pulmonary effort is normal. No respiratory distress.     Breath sounds: No rales.  Chest:     Chest wall: No tenderness.  Abdominal:     General: There is no distension.     Palpations: Abdomen is soft. There is mass.     Tenderness: There is no abdominal tenderness.  Genitourinary:    Comments: Previously palpable left inguinal lymph node, tender Not checked during this encounter. Musculoskeletal:        General: Normal range of motion.     Cervical back: Normal range of motion and neck supple.  Skin:    General: Skin is warm and dry.     Findings: No erythema.  Neurological:     Mental Status: He is alert and oriented to person, place, and time.     Cranial Nerves: No cranial nerve deficit.     Motor: No abnormal muscle tone.     Coordination: Coordination normal.  Psychiatric:        Mood and Affect: Affect normal.     LABORATORY RESULTS. CBC    Component Value Date/Time   WBC 13.0 (H) 08/05/2022 0828   RBC 3.69 (L) 08/05/2022 0828   HGB 10.0 (L) 08/05/2022 0828   HGB 13.0 03/13/2015 0852   HCT 32.3 (L) 08/05/2022 0828   HCT 40.9 03/13/2015 0852   PLT 231 08/05/2022 0828   PLT 217 03/13/2015 0852   MCV 87.5 08/05/2022 0828   MCV 78 (L) 03/13/2015 0852   MCH 27.1 08/05/2022 0828   MCHC 31.0 08/05/2022 0828   RDW 17.0 (H) 08/05/2022 0828   RDW 16.0 (H) 03/13/2015 0852   LYMPHSABS 7.8 (H) 08/05/2022 0828   LYMPHSABS 3.9 (H) 03/13/2015 0852   MONOABS 2.0 (H) 08/05/2022 0828   MONOABS  0.4 03/13/2015 0852   EOSABS 0.2 08/05/2022 0828   EOSABS 0.4 03/13/2015 0852   BASOSABS 0.1 08/05/2022 0828   BASOSABS 0.1 03/13/2015 0852   CMP     Component Value Date/Time   NA 138 08/05/2022 0828   NA 138 10/16/2019 0000   NA 136 03/13/2015 0852   K 4.5 08/05/2022 3220  K 3.9 03/13/2015 0852   CL 106 08/05/2022 0828   CL 104 03/13/2015 0852   CO2 25 08/05/2022 0828   CO2 26 03/13/2015 0852   GLUCOSE 108 (H) 08/05/2022 0828   GLUCOSE 96 03/13/2015 0852   BUN 19 08/05/2022 0828   BUN 14 10/16/2019 0000   BUN 11 03/13/2015 0852   CREATININE 1.34 (H) 08/05/2022 0828   CREATININE 1.17 02/03/2019 1612   CALCIUM 9.2 08/05/2022 0828   CALCIUM 9.3 03/13/2015 0852   PROT 7.0 08/05/2022 0828   PROT 8.1 03/13/2015 0852   ALBUMIN 3.9 08/05/2022 0828   ALBUMIN 4.5 03/13/2015 0852   AST 18 08/05/2022 0828   AST 26 03/13/2015 0852   ALT 10 08/05/2022 0828   ALT 12 (L) 03/13/2015 0852   ALKPHOS 65 08/05/2022 0828   ALKPHOS 57 03/13/2015 0852   BILITOT 0.8 08/05/2022 0828   BILITOT 1.2 03/13/2015 0852   GFRNONAA 57 (L) 08/05/2022 0828   GFRNONAA >60 03/13/2015 0852   GFRAA >60 04/25/2020 1123   GFRAA >60 03/13/2015 0852   PATHOLOGY:  11/02/2016 Peripheral blood FISH studies revealed 20% of nuclei positive for loss of 1 ATM signal, 63% of nuclei positive for trisomy 12, and 32% of nuclei positive for loss of 1 TP53 signal. Results for CCND1/IGH and 13q were normal.  09/13/2014 Left axillary node biopsy on  confirmed B-cell small lymphocytic lymphoma (B-CLL/SLL)   IMAGE STUDIES CT ABDOMEN PELVIS W CONTRAST  Result Date: 07/11/2022 CLINICAL DATA:  Abdominal pain with nausea, vomiting and diarrhea. Neutropenic. History of CLL. Currently on chemotherapy. EXAM: CT ABDOMEN AND PELVIS WITH CONTRAST TECHNIQUE: Multidetector CT imaging of the abdomen and pelvis was performed using the standard protocol following bolus administration of intravenous contrast. RADIATION DOSE REDUCTION:  This exam was performed according to the departmental dose-optimization program which includes automated exposure control, adjustment of the mA and/or kV according to patient size and/or use of iterative reconstruction technique. CONTRAST:  175m OMNIPAQUE IOHEXOL 300 MG/ML  SOLN COMPARISON:  05/08/2022 FINDINGS: Lower chest: Lung bases are clear. Hepatobiliary: Gallbladder is somewhat contracted. Liver and biliary tree are normal. Pancreas: Normal. Spleen: Normal. Adrenals/Urinary Tract: Adrenal glands are normal. Kidneys are normal in size without hydronephrosis or nephrolithiasis. Possible small cyst over the upper pole left kidney unchanged. Bladder is normal. Stomach/Bowel: Stomach and small bowel are normal. Appendix not well visualized. Colon is decompressed distal to the splenic flexure. Vascular/Lymphatic: Mild calcified plaque over the abdominal aorta which is normal in caliber. Stent unchanged over the right femoral artery. Known significant bulky adenopathy throughout the abdomen/mesentery and retroperitoneum with mild to moderate overall interval improvement. Improved external iliac chain adenopathy. Reproductive: Unremarkable. Other: Small amount of free fluid over the posterior pelvis which is new. Musculoskeletal: No acute findings. IMPRESSION: 1. No acute findings in the abdomen/pelvis. 2. Known significant bulky adenopathy throughout the abdomen/mesentery and retroperitoneum with mild to moderate overall improvement. Improved external iliac chain adenopathy. Findings compatible with known CLL and ongoing chemotherapy. 3. Small amount of nonspecific free fluid over the posterior pelvis which is new. 4. Aortic atherosclerosis. Aortic Atherosclerosis (ICD10-I70.0). Electronically Signed   By: DMarin OlpM.D.   On: 07/11/2022 09:05   DG Chest 2 View  Result Date: 07/11/2022 CLINICAL DATA:  Shortness of breath EXAM: CHEST - 2 VIEW COMPARISON:  06/15/2022 and prior studies FINDINGS: The  cardiomediastinal silhouette is unremarkable. There is no evidence of focal airspace disease, pulmonary edema, pleural effusion, or pneumothorax. No acute bony abnormalities are identified.  Surgical material in the RIGHT axillary region noted. Little interval change since the prior study. IMPRESSION: No active cardiopulmonary disease. Electronically Signed   By: Margarette Canada M.D.   On: 07/11/2022 06:39   ECHOCARDIOGRAM COMPLETE  Result Date: 06/16/2022    ECHOCARDIOGRAM REPORT   Patient Name:   JAMAINE QUINTIN Date of Exam: 06/16/2022 Medical Rec #:  [id number]    Height:       68.0 in Accession #:    [id number]   Weight:       129.6 lb Date of Birth:  [birth year]    BSA:          1.700 m Patient Age:    38 years     BP:           151/64 mmHg Patient Gender: M            HR:           43 bpm. Exam Location:  [facility] Procedure: 2D Echo, Cardiac Doppler and Color Doppler Indications:     R55 Syncope  History:         Patient has no prior history of Echocardiogram examinations.                  Risk Factors:Dyslipidemia.  Sonographer:     Bernadene Person RDCS Referring Phys:  [id number] [facility] AMERY Diagnosing Phys: Nelva Bush MD IMPRESSIONS  1. Left ventricular ejection fraction, by estimation, is 55 to 60%. The left ventricle has normal function. The left ventricle has no regional wall motion abnormalities. Left ventricular diastolic parameters are indeterminate.  2. Right ventricular systolic function is low normal. The right ventricular size is mildly enlarged. There is normal pulmonary artery systolic pressure.  3. Right atrial size was mildly dilated.  4. The mitral valve is normal in structure. Mild mitral valve regurgitation. No evidence of mitral stenosis.  5. The aortic valve is tricuspid. There is mild thickening of the aortic valve. Aortic valve regurgitation is mild. Aortic valve sclerosis is present, with no evidence of aortic valve stenosis.  6. There is borderline dilatation of the ascending aorta, measuring  36 mm.  7. The inferior vena cava is normal in size with greater than 50% respiratory variability, suggesting right atrial pressure of 3 mmHg. FINDINGS  Left Ventricle: Left ventricular ejection fraction, by estimation, is 55 to 60%. The left ventricle has normal function. The left ventricle has no regional wall motion abnormalities. The left ventricular internal cavity size was normal in size. There is  no left ventricular hypertrophy. Left ventricular diastolic parameters are indeterminate. Right Ventricle: The right ventricular size is mildly enlarged. No increase in right ventricular wall thickness. Right ventricular systolic function is low normal. There is normal pulmonary artery systolic pressure. The tricuspid regurgitant velocity is 1.71 m/s, and with an assumed right atrial pressure of 3 mmHg, the estimated right ventricular systolic pressure is 97.0 mmHg. Left Atrium: Left atrial size was normal in size. Right Atrium: Right atrial size was mildly dilated. Pericardium: The pericardium was not well visualized. Mitral Valve: The mitral valve is normal in structure. Mild mitral valve regurgitation. No evidence of mitral valve stenosis. Tricuspid Valve: The tricuspid valve is normal in structure. Tricuspid valve regurgitation is trivial. Aortic Valve: The aortic valve is tricuspid. There is mild thickening of the aortic valve. Aortic valve regurgitation is mild. Aortic regurgitation PHT measures 770 msec. Aortic valve sclerosis is present, with no evidence of aortic valve stenosis. Pulmonic Valve: The pulmonic valve  was normal in structure. Pulmonic valve regurgitation is trivial. No evidence of pulmonic stenosis. Aorta: The aortic root is normal in size and structure. There is borderline dilatation of the ascending aorta, measuring 36 mm. Pulmonary Artery: The pulmonary artery is not well seen. Venous: The inferior vena cava is normal in size with greater than 50% respiratory variability, suggesting right  atrial pressure of 3 mmHg. IAS/Shunts: The interatrial septum was not well visualized.  LEFT VENTRICLE PLAX 2D LVIDd:         5.21 cm     Diastology LVIDs:         3.23 cm     LV e' medial:    7.90 cm/s LV PW:         0.69 cm     LV E/e' medial:  6.8 LV IVS:        0.67 cm     LV e' lateral:   9.48 cm/s LVOT diam:     2.10 cm     LV E/e' lateral: 5.7 LV SV:         93 LV SV Index:   55 LVOT Area:     3.46 cm  LV Volumes (MOD) LV vol d, MOD A2C: 95.6 ml LV vol d, MOD A4C: 59.8 ml LV vol s, MOD A2C: 34.2 ml LV vol s, MOD A4C: 23.8 ml LV SV MOD A2C:     61.4 ml LV SV MOD A4C:     59.8 ml LV SV MOD BP:      48.7 ml RIGHT VENTRICLE RV Basal diam:  4.40 cm RV S prime:     9.89 cm/s TAPSE (M-mode): 2.5 cm LEFT ATRIUM           Index        RIGHT ATRIUM           Index LA diam:      3.90 cm 2.29 cm/m   RA Area:     19.60 cm LA Vol (A2C): 45.3 ml 26.65 ml/m  RA Volume:   53.50 ml  31.47 ml/m LA Vol (A4C): 50.7 ml 29.83 ml/m  AORTIC VALVE LVOT Vmax:   111.00 cm/s LVOT Vmean:  73.500 cm/s LVOT VTI:    0.269 m AI PHT:      770 msec  AORTA Ao Root diam: 3.50 cm Ao Asc diam:  3.60 cm MITRAL VALVE               TRICUSPID VALVE MV Area (PHT): 2.32 cm    TR Peak grad:   11.7 mmHg MV Decel Time: 327 msec    TR Vmax:        171.00 cm/s MV E velocity: 53.90 cm/s MV A velocity: 65.20 cm/s  SHUNTS MV E/A ratio:  0.83        Systemic VTI:  0.27 m                            Systemic Diam: 2.10 cm Nelva Bush MD Electronically signed by Nelva Bush MD Signature Date/Time: 06/16/2022/2:50:44 PM    Final    DG Chest Portable 1 View  Result Date: 06/15/2022 CLINICAL DATA:  Single episode this morning. EXAM: PORTABLE CHEST 1 VIEW COMPARISON:  05/12/2021 FINDINGS: The heart size and mediastinal contours are within normal limits. Both lungs are clear. The visualized skeletal structures are unremarkable. IMPRESSION: No active disease. Electronically Signed   By: [place] Service.D.  On: 06/15/2022 11:20   [name] CORE BIOPSY (LYMPH  NODES)  Result Date: 06/08/2022 INDICATION: CLL. EXAM: ULTRASOUND-GUIDED LEFT INGUINAL LYMPH NODE BIOPSY COMPARISON:  PET-CT, 05/28/2022.  CT CAP, 05/08/2022. MEDICATIONS: None ANESTHESIA/SEDATION: Local anesthetic was administered. COMPLICATIONS: None immediate. TECHNIQUE: Informed written consent was obtained from the patient and/or patient's representative after a discussion of the risks, benefits and alternatives to treatment. Questions regarding the procedure were encouraged and answered. Initial ultrasound scanning demonstrated pathologically-enlarged LEFT inguinal lymph nodes. An ultrasound image was saved for documentation purposes. The procedure was planned. A timeout was performed prior to the initiation of the procedure. The operative was prepped and draped in the usual sterile fashion, and a sterile drape was applied covering the operative field. A timeout was performed prior to the initiation of the procedure. Local anesthesia was provided with 1% lidocaine. Under direct ultrasound guidance, an 18 gauge core needle device was utilized to obtain to obtain 3 core needle biopsies of the LEFT inguinal lymph node. The samples were placed in saline and submitted to pathology. The needle was removed and hemostasis was achieved with manual compression. Post procedure scan was negative for significant hematoma. A dressing was placed. The patient tolerated the procedure well without immediate postprocedural complication. IMPRESSION: Successful ultrasound guided biopsy of the enlarged LEFT inguinal lymph node, as above. Michaelle Birks, MD [facility] Electronically Signed   By: Michaelle Birks M.D.   On: 06/08/2022 10:35   NM PET Image Restage (PS) Skull Base to Thigh (F-18 FDG)  Result Date: 05/30/2022 CLINICAL DATA:  Subsequent treatment strategy for chronic lymphocytic leukemia. EXAM: NUCLEAR MEDICINE PET SKULL BASE TO THIGH TECHNIQUE: 7.27 mCi F-18 FDG  was injected intravenously. Full-ring PET imaging was performed from the skull base to thigh after the radiotracer. CT data was obtained and used for attenuation correction and anatomic localization. Fasting blood glucose: 92 mg/dl COMPARISON:  CT AP 05/08/2022 FINDINGS: Mediastinal blood pool activity: SUV max 1.94 Liver activity: SUV max 2.72. NECK: No hypermetabolic lymph nodes in the neck. Incidental CT findings: none CHEST: Again noted are prominent bilateral axillary lymph nodes. -Index right axillary lymph node adjacent to surgical clips measures 1.2 cm and has an SUV max 1.49, image 83/2. -Index left axillary lymph node measures 1.1 cm with SUV max 1.52, image 63/2. No enlarged or tracer avid mediastinal, hilar or supraclavicular lymph nodes. No tracer avid pulmonary nodules. There are a few scattered subpleural nodules within the periphery of both upper lobes which are technically too small to reliably characterize measuring up to 4 mm, image 83/2. Incidental CT findings: Aortic atherosclerosis and coronary artery calcifications. Paraseptal and centrilobular emphysema. ABDOMEN/PELVIS: No abnormal tracer uptake identified within the liver, pancreas, or adrenal glands. -The spleen measures 11 cm in cranial caudal dimension and is displaced into the left upper quadrant of the abdomen by the bulky abdominal adenopathy. Mild diffuse increased uptake within the spleen has an SUV max 2.62 which is slightly less than background liver activity. Bulky abdominal adenopathy is again identified as reported on recent CT of the abdomen and pelvis from 05/08/2022. -Dominant nodal conglomeration within the abdomen which encases the mesenteric vasculature measures 18.5 x 9.4 by 22 cm has an SUV max 3.15, image 155/2. Bilateral pelvic adenopathy is identified, including: -Left external iliac lymph node measuring 2.5 cm with SUV max 2.89, image 199/2. -Right pelvic sidewall lymph node measures 1.5 cm with SUV max of 3.3, image  196/2. -Left inguinal lymph node measures 1.2  cm with SUV max of 2.4. Incidental CT findings: Aortic atherosclerotic calcifications. No ascites. SKELETON: No focal hypermetabolic activity to suggest skeletal metastasis. Incidental CT findings: none IMPRESSION: 1. Again seen is massive nodal conglomeration within the abdomen with mildly diffuse increased tracer uptake with SUV max of 3.15. There also enlarged bilateral pelvic and left inguinal lymph nodes which exhibit mild increased tracer uptake compatible with residual/recurrent lymphoma. Imaging findings are compatible with residual/recurrent metabolically active low-grade lymphoma. 2. No significant tracer uptake associated with the borderline enlarged bilateral axillary lymph nodes. 3. Unchanged appearance of small nonspecific subpleural nodules within the periphery of the upper lobes measuring up to 4 mm. These are too small to characterize by PET-CT. 4. Aortic Atherosclerosis (ICD10-I70.0) and Emphysema (ICD10-J43.9). 5. Coronary artery calcifications. 6. Normal size spleen with background activity similar to the liver. Electronically Signed   By: Kerby Moors M.D.   On: 05/30/2022 11:17   CT CHEST ABDOMEN PELVIS W CONTRAST  Result Date: 05/08/2022 CLINICAL DATA:  CLL, fatigue and weight loss, left groin lymphadenopathy palpated * Tracking Code: BO * EXAM: CT CHEST, ABDOMEN, AND PELVIS WITH CONTRAST TECHNIQUE: Multidetector CT imaging of the chest, abdomen and pelvis was performed following the standard protocol during bolus administration of intravenous contrast. RADIATION DOSE REDUCTION: This exam was performed according to the departmental dose-optimization program which includes automated exposure control, adjustment of the mA and/or kV according to patient size and/or use of iterative reconstruction technique. CONTRAST:  11m OMNIPAQUE IOHEXOL 300 MG/ML SOLN, additional oral enteric contrast COMPARISON:  CT chest, 06/05/2021, PET-CT, 06/24/2016  FINDINGS: CT CHEST FINDINGS Cardiovascular: No significant vascular findings. Normal heart size. Three-vessel coronary artery calcifications. No pericardial effusion. Mediastinum/Nodes: When compared most recent prior CT examination of the chest dated 06/05/2021, unchanged enlarged bilateral axillary lymph nodes, right axillary nodes measuring up to 1.5 x 1.2 cm (series 2, image 16). Surgical clips in the right axilla. Unchanged prominent pretracheal nodes measuring up to 1.4 x 1.3 cm (series 2, image 26). Thyroid gland, trachea, and esophagus demonstrate no significant findings. Lungs/Pleura: Mild centrilobular and paraseptal emphysema. Stable, benign small subpleural pulmonary nodules, for example a 0.5 cm nodule of the lingula (series 3, image 104). No further follow-up or characterization is required. No pleural effusion or pneumothorax. Musculoskeletal: No chest wall mass or suspicious osseous lesions identified. CT ABDOMEN PELVIS FINDINGS Hepatobiliary: No solid liver abnormality is seen. No gallstones, gallbladder wall thickening, or biliary dilatation. Pancreas: Unremarkable. No pancreatic ductal dilatation or surrounding inflammatory changes. Spleen: Normal in size without significant abnormality. Adrenals/Urinary Tract: Adrenal glands are unremarkable. Mild right hydronephrosis, the proximal ureter appears compressed or obstructed by overlying lymphadenopathy. No left-sided hydronephrosis. No calculi or renal mass. Bladder is unremarkable. Stomach/Bowel: Stomach is within normal limits. Appendix appears normal. No evidence of bowel wall thickening, distention, or inflammatory changes. Vascular/Lymphatic: Aortic atherosclerosis. When compared to most recent imaging of the abdomen and pelvis dated 06/24/2016, there are numerous, extremely bulky lymph nodes throughout the abdomen and pelvis, which are greatly increased in size. Due to bulky, confluent lymphadenopathy involving the upper abdominal, mesenteric,  and retroperitoneal stations, these are difficult to discretely measure, however a large node conglomerate in the ventral abdomen measures at least 14.5 x 6.0 cm (series 2, image 75). Interval enlargement of left iliac/pelvic sidewall lymph nodes, measuring up to 3.4 x 2.9 cm, previously 2.5 x 1.8 cm (series 2, image 106). Newly enlarged left inguinal lymph nodes measuring up to 2.6 x 1.6 cm (series 2, image 111). Reproductive:  No mass or other abnormality. Other: No abdominal wall hernia or abnormality. No ascites. Musculoskeletal: No acute osseous findings. IMPRESSION: 1. When compared to most recent prior CT examination of the chest dated 06/05/2021, unchanged enlarged bilateral axillary lymph nodes and prominent pretracheal mediastinal lymph nodes. 2. However, when compared to most recent prior CT examination of the abdomen and pelvis dated 06/24/2016, there are numerous, extremely bulky lymph nodes throughout the abdomen and pelvis, which are greatly increased in size. Newly enlarged left inguinal lymph nodes, in keeping with reported palpable lymphadenopathy. 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 3. Mild right hydronephrosis; the proximal ureter appears compressed or obstructed by overlying lymphadenopathy. 4. Emphysema. 5. Coronary artery disease. These results will be called to the ordering clinician or representative by the Radiologist Assistant, and communication documented in the PACS or [employer]. Aortic Atherosclerosis (ICD10-I70.0) and Emphysema (ICD10-J43.9). Electronically Signed   By: Delanna Ahmadi M.D.   On: 05/08/2022 20:00

## 2022-08-05 NOTE — Assessment & Plan Note (Addendum)
CLL treatment as listed above 

## 2022-08-12 MED FILL — Dexamethasone Sodium Phosphate Inj 100 MG/10ML: INTRAMUSCULAR | Qty: 1 | Status: AC

## 2022-08-13 ENCOUNTER — Ambulatory Visit: Payer: Medicare HMO

## 2022-08-13 ENCOUNTER — Other Ambulatory Visit: Payer: Self-pay

## 2022-08-13 ENCOUNTER — Inpatient Hospital Stay: Payer: Medicare HMO

## 2022-08-13 ENCOUNTER — Ambulatory Visit: Payer: Medicare HMO | Admitting: Oncology

## 2022-08-13 ENCOUNTER — Other Ambulatory Visit: Payer: Medicare HMO

## 2022-08-13 VITALS — BP 126/83 | HR 49 | Temp 96.5°F | Resp 18

## 2022-08-13 DIAGNOSIS — Z5112 Encounter for antineoplastic immunotherapy: Secondary | ICD-10-CM | POA: Diagnosis not present

## 2022-08-13 DIAGNOSIS — C9110 Chronic lymphocytic leukemia of B-cell type not having achieved remission: Secondary | ICD-10-CM | POA: Diagnosis not present

## 2022-08-13 DIAGNOSIS — D649 Anemia, unspecified: Secondary | ICD-10-CM | POA: Diagnosis not present

## 2022-08-13 LAB — COMPREHENSIVE METABOLIC PANEL
ALT: 10 U/L (ref 0–44)
AST: 17 U/L (ref 15–41)
Albumin: 4 g/dL (ref 3.5–5.0)
Alkaline Phosphatase: 54 U/L (ref 38–126)
Anion gap: 5 (ref 5–15)
BUN: 16 mg/dL (ref 8–23)
CO2: 25 mmol/L (ref 22–32)
Calcium: 9 mg/dL (ref 8.9–10.3)
Chloride: 109 mmol/L (ref 98–111)
Creatinine, Ser: 1.33 mg/dL — ABNORMAL HIGH (ref 0.61–1.24)
GFR, Estimated: 58 mL/min — ABNORMAL LOW (ref >60)
Glucose, Bld: 110 mg/dL — ABNORMAL HIGH (ref 70–99)
Potassium: 4.4 mmol/L (ref 3.5–5.1)
Sodium: 139 mmol/L (ref 135–145)
Total Bilirubin: 1 mg/dL (ref 0.3–1.2)
Total Protein: 6.9 g/dL (ref 6.5–8.1)

## 2022-08-13 LAB — CBC WITH DIFFERENTIAL/PLATELET
Abs Immature Granulocytes: 0.07 10*3/uL (ref 0.00–0.07)
Basophils Absolute: 0.1 10*3/uL (ref 0.0–0.1)
Basophils Relative: 1 %
Eosinophils Absolute: 0.2 10*3/uL (ref 0.0–0.5)
Eosinophils Relative: 2 %
HCT: 32.9 % — ABNORMAL LOW (ref 39.0–52.0)
Hemoglobin: 10.3 g/dL — ABNORMAL LOW (ref 13.0–17.0)
Immature Granulocytes: 1 %
Lymphocytes Relative: 49 %
Lymphs Abs: 4.1 10*3/uL — ABNORMAL HIGH (ref 0.7–4.0)
MCH: 27.1 pg (ref 26.0–34.0)
MCHC: 31.3 g/dL (ref 30.0–36.0)
MCV: 86.6 fL (ref 80.0–100.0)
Monocytes Absolute: 1.4 10*3/uL — ABNORMAL HIGH (ref 0.1–1.0)
Monocytes Relative: 17 %
Neutro Abs: 2.5 10*3/uL (ref 1.7–7.7)
Neutrophils Relative %: 30 %
Platelets: 222 10*3/uL (ref 150–400)
RBC: 3.8 MIL/uL — ABNORMAL LOW (ref 4.22–5.81)
RDW: 16.7 % — ABNORMAL HIGH (ref 11.5–15.5)
Smear Review: NORMAL
WBC: 8.3 10*3/uL (ref 4.0–10.5)
nRBC: 0 % (ref 0.0–0.2)

## 2022-08-13 MED ORDER — SODIUM CHLORIDE 0.9 % IV SOLN
Freq: Once | INTRAVENOUS | Status: AC
  Filled 2022-08-13: qty 250

## 2022-08-13 MED ORDER — MONTELUKAST SODIUM 10 MG PO TABS: 10.0000 mg | ORAL_TABLET | Freq: Once | ORAL | Status: DC

## 2022-08-13 MED ORDER — SODIUM CHLORIDE 0.9 % IV SOLN
10.0000 mg | Freq: Once | INTRAVENOUS | Status: AC
  Administered 2022-08-13: 10 mg via INTRAVENOUS
  Filled 2022-08-13: qty 10

## 2022-08-13 MED ORDER — DIPHENHYDRAMINE HCL 25 MG PO CAPS
50.0000 mg | ORAL_CAPSULE | Freq: Once | ORAL | Status: AC
  Administered 2022-08-13: 50 mg via ORAL
  Filled 2022-08-13: qty 2

## 2022-08-13 MED ORDER — ACETAMINOPHEN 325 MG PO TABS
650.0000 mg | ORAL_TABLET | Freq: Once | ORAL | Status: AC
  Administered 2022-08-13: 650 mg via ORAL
  Filled 2022-08-13: qty 2

## 2022-08-13 MED ORDER — SODIUM CHLORIDE 0.9 % IV SOLN
375.0000 mg/m2 | Freq: Once | INTRAVENOUS | Status: AC
  Administered 2022-08-13: 600 mg via INTRAVENOUS
  Filled 2022-08-13: qty 10

## 2022-08-13 NOTE — Progress Notes (Signed)
Pt took home dose of Singulair prior to appointment this morning. Per pharmacy and MD, may initiate today's infusion with subsequent titration.

## 2022-08-13 NOTE — Patient Instructions (Signed)
[facility]  Discharge Instructions: Thank you for choosing [facility] to provide your oncology and hematology care.  If you have a lab appointment with the Cancer Center, please go directly to the Cancer Center and check in at the registration area.  Wear comfortable clothing and clothing appropriate for easy access to any Portacath or PICC line.   We strive to give you quality time with your provider. You may need to reschedule your appointment if you arrive late (15 or more minutes).  Arriving late affects you and other patients whose appointments are after yours.  Also, if you miss three or more appointments without notifying the office, you may be dismissed from the clinic at the provider's discretion.      For prescription refill requests, have your pharmacy contact our office and allow 72 hours for refills to be completed.    Today you received the following chemotherapy and/or immunotherapy agents- Rituximab      To help prevent nausea and vomiting after your treatment, we encourage you to take your nausea medication as directed.  BELOW ARE SYMPTOMS THAT SHOULD BE REPORTED IMMEDIATELY: *FEVER GREATER THAN 100.4 F (38 C) OR HIGHER *CHILLS OR SWEATING *NAUSEA AND VOMITING THAT IS NOT CONTROLLED WITH YOUR NAUSEA MEDICATION *UNUSUAL SHORTNESS OF BREATH *UNUSUAL BRUISING OR BLEEDING *URINARY PROBLEMS (pain or burning when urinating, or frequent urination) *BOWEL PROBLEMS (unusual diarrhea, constipation, pain near the anus) TENDERNESS IN MOUTH AND THROAT WITH OR WITHOUT PRESENCE OF ULCERS (sore throat, sores in mouth, or a toothache) UNUSUAL RASH, SWELLING OR PAIN  UNUSUAL VAGINAL DISCHARGE OR ITCHING   Items with * indicate a potential emergency and should be followed up as soon as possible or go to the Emergency Department if any problems should occur.  Please show the CHEMOTHERAPY ALERT CARD or IMMUNOTHERAPY ALERT CARD at check-in to  the Emergency Department and triage nurse.  Should you have questions after your visit or need to cancel or reschedule your appointment, please contact [facility]  [phone] and follow the prompts.  Office hours are 8:00 a.m. to 4:30 p.m. Monday - Friday. Please note that voicemails left after 4:00 p.m. may not be returned until the following business day.  We are closed weekends and major holidays. You have access to a nurse at all times for urgent questions. Please call the main number to the clinic [phone] and follow the prompts.  For any non-urgent questions, you may also contact your provider using MyChart. We now offer e-Visits for anyone 18 and older to request care online for non-urgent symptoms. For details visit [email or web address].   Also download the MyChart app! Go to the app store, search "MyChart", open the app, select [facility], and log in with your MyChart username and password.  Masks are optional in the cancer centers. If you would like for your care team to wear a mask while they are taking care of you, please let them know. For doctor visits, patients may have with them one support person who is at least 69 years old. At this time, visitors are not allowed in the infusion area.   

## 2022-08-19 ENCOUNTER — Other Ambulatory Visit: Payer: Self-pay

## 2022-08-19 DIAGNOSIS — C9110 Chronic lymphocytic leukemia of B-cell type not having achieved remission: Secondary | ICD-10-CM

## 2022-08-20 ENCOUNTER — Inpatient Hospital Stay: Payer: Medicare HMO

## 2022-08-20 ENCOUNTER — Other Ambulatory Visit (HOSPITAL_COMMUNITY): Payer: Self-pay

## 2022-08-20 VITALS — BP 123/67 | HR 68 | Temp 96.5°F | Resp 18 | Ht 68.0 in | Wt 127.4 lb

## 2022-08-20 DIAGNOSIS — D649 Anemia, unspecified: Secondary | ICD-10-CM | POA: Diagnosis not present

## 2022-08-20 DIAGNOSIS — C9110 Chronic lymphocytic leukemia of B-cell type not having achieved remission: Secondary | ICD-10-CM

## 2022-08-20 DIAGNOSIS — Z5112 Encounter for antineoplastic immunotherapy: Secondary | ICD-10-CM | POA: Diagnosis not present

## 2022-08-20 LAB — COMPREHENSIVE METABOLIC PANEL
ALT: 12 U/L (ref 0–44)
AST: 17 U/L (ref 15–41)
Albumin: 3.9 g/dL (ref 3.5–5.0)
Alkaline Phosphatase: 53 U/L (ref 38–126)
Anion gap: 7 (ref 5–15)
BUN: 17 mg/dL (ref 8–23)
CO2: 26 mmol/L (ref 22–32)
Calcium: 9 mg/dL (ref 8.9–10.3)
Chloride: 104 mmol/L (ref 98–111)
Creatinine, Ser: 1.33 mg/dL — ABNORMAL HIGH (ref 0.61–1.24)
GFR, Estimated: 58 mL/min — ABNORMAL LOW (ref >60)
Glucose, Bld: 96 mg/dL (ref 70–99)
Potassium: 4.2 mmol/L (ref 3.5–5.1)
Sodium: 137 mmol/L (ref 135–145)
Total Bilirubin: 0.7 mg/dL (ref 0.3–1.2)
Total Protein: 6.7 g/dL (ref 6.5–8.1)

## 2022-08-20 LAB — CBC WITH DIFFERENTIAL/PLATELET
Abs Immature Granulocytes: 0.1 10*3/uL — ABNORMAL HIGH (ref 0.00–0.07)
Basophils Absolute: 0 10*3/uL (ref 0.0–0.1)
Basophils Relative: 0 %
Eosinophils Absolute: 0.2 10*3/uL (ref 0.0–0.5)
Eosinophils Relative: 3 %
HCT: 33.6 % — ABNORMAL LOW (ref 39.0–52.0)
Hemoglobin: 10.5 g/dL — ABNORMAL LOW (ref 13.0–17.0)
Lymphocytes Relative: 46 %
Lymphs Abs: 3.2 10*3/uL (ref 0.7–4.0)
MCH: 27.4 pg (ref 26.0–34.0)
MCHC: 31.3 g/dL (ref 30.0–36.0)
MCV: 87.7 fL (ref 80.0–100.0)
Monocytes Absolute: 0.6 10*3/uL (ref 0.1–1.0)
Monocytes Relative: 9 %
Myelocytes: 1 %
Neutro Abs: 2.8 10*3/uL (ref 1.7–7.7)
Neutrophils Relative %: 41 %
Platelets: 210 10*3/uL (ref 150–400)
RBC: 3.83 MIL/uL — ABNORMAL LOW (ref 4.22–5.81)
RDW: 16.1 % — ABNORMAL HIGH (ref 11.5–15.5)
Smear Review: NORMAL
WBC: 6.9 10*3/uL (ref 4.0–10.5)
nRBC: 0 % (ref 0.0–0.2)

## 2022-08-20 MED ORDER — MONTELUKAST SODIUM 10 MG PO TABS
10.0000 mg | ORAL_TABLET | Freq: Once | ORAL | Status: AC
  Administered 2022-08-20: 10 mg via ORAL
  Filled 2022-08-20: qty 1

## 2022-08-20 MED ORDER — DIPHENHYDRAMINE HCL 25 MG PO CAPS
50.0000 mg | ORAL_CAPSULE | Freq: Once | ORAL | Status: AC
  Administered 2022-08-20: 50 mg via ORAL
  Filled 2022-08-20: qty 2

## 2022-08-20 MED ORDER — SODIUM CHLORIDE 0.9 % IV SOLN
10.0000 mg | Freq: Once | INTRAVENOUS | Status: AC
  Administered 2022-08-20: 10 mg via INTRAVENOUS
  Filled 2022-08-20: qty 10

## 2022-08-20 MED ORDER — SODIUM CHLORIDE 0.9 % IV SOLN: 375.0000 mg/m2 | Freq: Once | INTRAVENOUS | Status: DC

## 2022-08-20 MED ORDER — ACETAMINOPHEN 325 MG PO TABS
650.0000 mg | ORAL_TABLET | Freq: Once | ORAL | Status: AC
  Administered 2022-08-20: 650 mg via ORAL
  Filled 2022-08-20: qty 2

## 2022-08-20 MED ORDER — SODIUM CHLORIDE 0.9 % IV SOLN
Freq: Once | INTRAVENOUS | Status: AC
  Filled 2022-08-20: qty 250

## 2022-08-20 MED ORDER — SODIUM CHLORIDE 0.9 % IV SOLN
375.0000 mg/m2 | Freq: Once | INTRAVENOUS | Status: AC
  Administered 2022-08-20: 600 mg via INTRAVENOUS
  Filled 2022-08-20: qty 50

## 2022-08-20 NOTE — Patient Instructions (Signed)
[facility] CANCER CTR AT [place]  Discharge Instructions: Thank you for choosing [facility] to provide your oncology and hematology care.  If you have a lab appointment with the [facility], please go directly to the [facility] and check in at the registration area.  Wear comfortable clothing and clothing appropriate for easy access to any Portacath or PICC line.   We strive to give you quality time with your provider. You may need to reschedule your appointment if you arrive late (15 or more minutes).  Arriving late affects you and other patients whose appointments are after yours.  Also, if you miss three or more appointments without notifying the office, you may be dismissed from the clinic at the provider's discretion.      For prescription refill requests, have your pharmacy contact our office and allow 72 hours for refills to be completed.    Today you received the following chemotherapy and/or immunotherapy agents TRUXIMA      To help prevent nausea and vomiting after your treatment, we encourage you to take your nausea medication as directed.  BELOW ARE SYMPTOMS THAT SHOULD BE REPORTED IMMEDIATELY: *FEVER GREATER THAN 100.4 F (38 C) OR HIGHER *CHILLS OR SWEATING *NAUSEA AND VOMITING THAT IS NOT CONTROLLED WITH YOUR NAUSEA MEDICATION *UNUSUAL SHORTNESS OF BREATH *UNUSUAL BRUISING OR BLEEDING *URINARY PROBLEMS (pain or burning when urinating, or frequent urination) *BOWEL PROBLEMS (unusual diarrhea, constipation, pain near the anus) TENDERNESS IN MOUTH AND THROAT WITH OR WITHOUT PRESENCE OF ULCERS (sore throat, sores in mouth, or a toothache) UNUSUAL RASH, SWELLING OR PAIN  UNUSUAL VAGINAL DISCHARGE OR ITCHING   Items with * indicate a potential emergency and should be followed up as soon as possible or go to the Emergency Department if any problems should occur.  Please show the CHEMOTHERAPY ALERT CARD or IMMUNOTHERAPY ALERT CARD at check-in to the  Emergency Department and triage nurse.  Should you have questions after your visit or need to cancel or reschedule your appointment, please contact [facility] CANCER Standard AT [place]  [phone] and follow the prompts.  Office hours are 8:00 a.m. to 4:30 p.m. Monday - Friday. Please note that voicemails left after 4:00 p.m. may not be returned until the following business day.  We are closed weekends and major holidays. You have access to a nurse at all times for urgent questions. Please call the main number to the clinic [phone] and follow the prompts.  For any non-urgent questions, you may also contact your provider using MyChart. We now offer e-Visits for anyone 25 and older to request care online for non-urgent symptoms. For details visit [email or web address].   Also download the MyChart app! Go to the app store, search "MyChart", open the app, select [place], and log in with your MyChart username and password.  Masks are optional in the cancer centers. If you would like for your care team to wear a mask while they are taking care of you, please let them know. For doctor visits, patients may have with them one support person who is at least 70 years old. At this time, visitors are not allowed in the infusion area.  Rituximab Injection What is this medication? RITUXIMAB (ri TUX i mab) treats leukemia and lymphoma. It works by blocking a protein that causes cancer cells to grow and multiply. This helps to slow or stop the spread of cancer cells. It may also be used to treat autoimmune conditions, such as arthritis. It works by slowing  down an overactive immune system. It is a monoclonal antibody. This medicine may be used for other purposes; ask your health care provider or pharmacist if you have questions. COMMON BRAND NAME(S): RIABNI, Rituxan, RUXIENCE, truxima What should I tell my care team before I take this medication? They need to know if you have any of these  conditions: Chest pain Heart disease Immune system problems Infection, such as chickenpox, cold sores, hepatitis B, herpes Irregular heartbeat or rhythm Kidney disease Low blood counts, such as low white cells, platelets, red cells Lung disease Recent or upcoming vaccine An unusual or allergic reaction to rituximab, other medications, foods, dyes, or preservatives Pregnant or trying to get pregnant Breast-feeding How should I use this medication? This medication is injected into a vein. It is given by a care team in a hospital or clinic setting. A special MedGuide will be given to you before each treatment. Be sure to read this information carefully each time. Talk to your care team about the use of this medication in children. While this medication may be prescribed for children as young as 6 months for selected conditions, precautions do apply. Overdosage: If you think you have taken too much of this medicine contact a poison control center or emergency room at once. NOTE: This medicine is only for you. Do not share this medicine with others. What if I miss a dose? Keep appointments for follow-up doses. It is important not to miss your dose. Call your care team if you are unable to keep an appointment. What may interact with this medication? Do not take this medication with any of the following: Live vaccines This medication may also interact with the following: Cisplatin This list may not describe all possible interactions. Give your health care provider a list of all the medicines, herbs, non-prescription drugs, or dietary supplements you use. Also tell them if you smoke, drink alcohol, or use illegal drugs. Some items may interact with your medicine. What should I watch for while using this medication? Your condition will be monitored carefully while you are receiving this medication. You may need blood work while taking this medication. This medication can cause serious infusion  reactions. To reduce the risk your care team may give you other medications to take before receiving this one. Be sure to follow the directions from your care team. This medication may increase your risk of getting an infection. Call your care team for advice if you get a fever, chills, sore throat, or other symptoms of a cold or flu. Do not treat yourself. Try to avoid being around people who are sick. Call your care team if you are around anyone with measles, chickenpox, or if you develop sores or blisters that do not heal properly. Avoid taking medications that contain aspirin, acetaminophen, ibuprofen, naproxen, or ketoprofen unless instructed by your care team. These medications may hide a fever. This medication may cause serious skin reactions. They can happen weeks to months after starting the medication. Contact your care team right away if you notice fevers or flu-like symptoms with a rash. The rash may be red or purple and then turn into blisters or peeling of the skin. You may also notice a red rash with swelling of the face, lips, or lymph nodes in your neck or under your arms. In some patients, this medication may cause a serious brain infection that may cause death. If you have any problems seeing, thinking, speaking, walking, or standing, tell your care team right away.  If you cannot reach your care team, urgently seek another source of medical care. Talk to your care team if you may be pregnant. Serious birth defects can occur if you take this medication during pregnancy and for 12 months after the last dose. You will need a negative pregnancy test before starting this medication. Contraception is recommended while taking this medication and for 12 months after the last dose. Your care team can help you find the option that works for you. Do not breastfeed while taking this medication and for at least 6 months after the last dose. What side effects may I notice from receiving this  medication? Side effects that you should report to your care team as soon as possible: Allergic reactions or angioedema--skin rash, itching or hives, swelling of the face, eyes, lips, tongue, arms, or legs, trouble swallowing or breathing Bowel blockage--stomach cramping, unable to have a bowel movement or pass gas, loss of appetite, vomiting Dizziness, loss of balance or coordination, confusion or trouble speaking Heart attack--pain or tightness in the chest, shoulders, arms, or jaw, nausea, shortness of breath, cold or clammy skin, feeling faint or lightheaded Heart rhythm changes--fast or irregular heartbeat, dizziness, feeling faint or lightheaded, chest pain, trouble breathing Infection--fever, chills, cough, sore throat, wounds that don't heal, pain or trouble when passing urine, general feeling of discomfort or being unwell Infusion reactions--chest pain, shortness of breath or trouble breathing, feeling faint or lightheaded Kidney injury--decrease in the amount of urine, swelling of the ankles, hands, or feet Liver injury--right upper belly pain, loss of appetite, nausea, light-colored stool, dark yellow or brown urine, yellowing skin or eyes, unusual weakness or fatigue Redness, blistering, peeling, or loosening of the skin, including inside the mouth Stomach pain that is severe, does not go away, or gets worse Tumor lysis syndrome (TLS)--nausea, vomiting, diarrhea, decrease in the amount of urine, dark urine, unusual weakness or fatigue, confusion, muscle pain or cramps, fast or irregular heartbeat, joint pain Side effects that usually do not require medical attention (report to your care team if they continue or are bothersome): Headache Joint pain Nausea Runny or stuffy nose Unusual weakness or fatigue This list may not describe all possible side effects. Call your doctor for medical advice about side effects. You may report side effects to FDA at 1-800-FDA-1088. Where should I keep  my medication? This medication is given in a hospital or clinic. It will not be stored at home. NOTE: This sheet is a summary. It may not cover all possible information. If you have questions about this medicine, talk to your doctor, pharmacist, or health care provider.  2023 Elsevier/Gold Standard (2022-04-27 00:00:00)

## 2022-08-30 ENCOUNTER — Other Ambulatory Visit: Payer: Self-pay | Admitting: Oncology

## 2022-09-08 ENCOUNTER — Other Ambulatory Visit: Payer: Self-pay

## 2022-09-08 DIAGNOSIS — C9110 Chronic lymphocytic leukemia of B-cell type not having achieved remission: Secondary | ICD-10-CM

## 2022-09-09 ENCOUNTER — Inpatient Hospital Stay: Payer: Medicare HMO | Attending: Oncology

## 2022-09-15 ENCOUNTER — Encounter: Payer: Self-pay | Admitting: Family

## 2022-09-15 ENCOUNTER — Ambulatory Visit (INDEPENDENT_AMBULATORY_CARE_PROVIDER_SITE_OTHER): Payer: Medicare HMO | Admitting: Family

## 2022-09-15 VITALS — BP 120/80 | HR 59 | Temp 98.1°F | Ht 67.0 in | Wt 134.6 lb

## 2022-09-15 DIAGNOSIS — R3911 Hesitancy of micturition: Secondary | ICD-10-CM | POA: Diagnosis not present

## 2022-09-15 DIAGNOSIS — Z23 Encounter for immunization: Secondary | ICD-10-CM | POA: Diagnosis not present

## 2022-09-15 DIAGNOSIS — K219 Gastro-esophageal reflux disease without esophagitis: Secondary | ICD-10-CM | POA: Diagnosis not present

## 2022-09-15 DIAGNOSIS — I2510 Atherosclerotic heart disease of native coronary artery without angina pectoris: Secondary | ICD-10-CM | POA: Diagnosis not present

## 2022-09-15 MED ORDER — ROSUVASTATIN CALCIUM 10 MG PO TABS: 10.0000 mg | ORAL_TABLET | Freq: Every day | ORAL | 3 refills | Status: DC

## 2022-09-15 NOTE — Patient Instructions (Addendum)
I agree that you are overdue to follow-up with cardiology, Dr. Rockey Situ.  You are still an established patient.  You may call number below to schedule an appointment:  [phone]  I have also reached out Dr. Rockey Situ today in regards to scheduling appointment.  We will let you know as soon as we hear  I have replaced referral to urology for concern for enlarged prostate. Let us know if you dont hear back within a week in regards to an appointment being scheduled.    Burping may be related to foods you are eating or acid reflux. You may trial over the counter Gas X if you would like. The other option would start over the counter pepcid ac at bedtime. Continue protonix '20mg'$  as you are on.    Food Choices for Gastroesophageal Reflux Disease, Adult When you have gastroesophageal reflux disease (GERD), the foods you eat and your eating habits are very important. Choosing the right foods can help ease the discomfort of GERD. Consider working with a dietitian to help you make healthy food choices. What are tips for following this plan? Reading food labels Look for foods that are low in saturated fat. Foods that have less than 5% of daily value (DV) of fat and 0 g of trans fats may help with your symptoms. Cooking Cook foods using methods other than frying. This may include baking, steaming, grilling, or broiling. These are all methods that do not need a lot of fat for cooking. To add flavor, try to use herbs that are low in spice and acidity. Meal planning  Choose healthy foods that are low in fat, such as fruits, vegetables, whole grains, low-fat dairy products, lean meats, fish, and poultry. Eat frequent, small meals instead of three large meals each day. Eat your meals slowly, in a relaxed setting. Avoid bending over or lying down until 2-3 hours after eating. Limit high-fat foods such as fatty meats or fried foods. Limit your intake of fatty foods, such as oils, butter, and shortening. Avoid  the following as told by your health care provider: Foods that cause symptoms. These may be different for different people. Keep a food diary to keep track of foods that cause symptoms. Alcohol. Drinking large amounts of liquid with meals. Eating meals during the 2-3 hours before bed. Lifestyle Maintain a healthy weight. Ask your health care provider what weight is healthy for you. If you need to lose weight, work with your health care provider to do so safely. Exercise for at least 30 minutes on 5 or more days each week, or as told by your health care provider. Avoid wearing clothes that fit tightly around your waist and chest. Do not use any products that contain nicotine or tobacco. These products include cigarettes, chewing tobacco, and vaping devices, such as e-cigarettes. If you need help quitting, ask your health care provider. Sleep with the head of your bed raised. Use a wedge under the mattress or blocks under the bed frame to raise the head of the bed. Chew sugar-free gum after mealtimes. What foods should I eat?  Eat a healthy, well-balanced diet of fruits, vegetables, whole grains, low-fat dairy products, lean meats, fish, and poultry. Each person is different. Foods that may trigger symptoms in one person may not trigger any symptoms in another person. Work with your health care provider to identify foods that are safe for you. The items listed above may not be a complete list of recommended foods and beverages. Contact a  dietitian for more information. What foods should I avoid? Limiting some of these foods may help manage the symptoms of GERD. Everyone is different. Consult a dietitian or your health care provider to help you identify the exact foods to avoid, if any. Fruits Any fruits prepared with added fat. Any fruits that cause symptoms. For some people this may include citrus fruits, such as oranges, grapefruit, pineapple, and lemons. Vegetables Deep-fried vegetables. [place]  fries. Any vegetables prepared with added fat. Any vegetables that cause symptoms. For some people, this may include tomatoes and tomato products, chili peppers, onions and garlic, and horseradish. Grains Pastries or quick breads with added fat. Meats and other proteins High-fat meats, such as fatty beef or pork, hot dogs, ribs, ham, sausage, salami, and bacon. Fried meat or protein, including fried fish and fried chicken. Nuts and nut butters, in large amounts. Dairy Whole milk and chocolate milk. Sour cream. Cream. Ice cream. Cream cheese. Milkshakes. Fats and oils Butter. Margarine. Shortening. Ghee. Beverages Coffee and tea, with or without caffeine. Carbonated beverages. Sodas. Energy drinks. Fruit juice made with acidic fruits, such as orange or grapefruit. Tomato juice. Alcoholic drinks. Sweets and desserts Chocolate and cocoa. Donuts. Seasonings and condiments Pepper. Peppermint and spearmint. Added salt. Any condiments, herbs, or seasonings that cause symptoms. For some people, this may include curry, hot sauce, or vinegar-based salad dressings. The items listed above may not be a complete list of foods and beverages to avoid. Contact a dietitian for more information. Questions to ask your health care provider Diet and lifestyle changes are usually the first steps that are taken to manage symptoms of GERD. If diet and lifestyle changes do not improve your symptoms, talk with your health care provider about taking medicines. Where to find more information International Foundation for Gastrointestinal Disorders: aboutgerd.org Summary When you have gastroesophageal reflux disease (GERD), food and lifestyle choices may be very helpful in easing the discomfort of GERD. Eat frequent, small meals instead of three large meals each day. Eat your meals slowly, in a relaxed setting. Avoid bending over or lying down until 2-3 hours after eating. Limit high-fat foods such as fatty meats or fried  foods. This information is not intended to replace advice given to you by your health care provider. Make sure you discuss any questions you have with your health care provider. Document Revised: 06/17/2020 Document Reviewed: 06/17/2020 Elsevier Patient Education  [employer].

## 2022-09-15 NOTE — Assessment & Plan Note (Signed)
Urinary hesitancy has improved on Flomax.  Advised to continue Flomax.  Experiencing nocturia.  He does snore however low suspicion for sleep apnea at this time.  Greater concern for enlarged prostate based on prostate exam today.  I placed referral again to urology.  Of note, patient requested Viagra today.  In the setting of coronary artery disease and pending cardiac cath with Dr. Rockey Situ, advised patient to wait on starting Viagra until he has completed cardiac evaluation.  He verbalized understanding.

## 2022-09-15 NOTE — Assessment & Plan Note (Addendum)
Patient has been noncompliant with pravastatin.  Again, today I stressed the importance of remaining on a statin with a known history of coronary artery disease.  I have opted instead to discontinue pravastatin as he has not been taking and start Crestor 10 mg.  He will have hepatic enzymes and lipid panel checked with labs done from Dr. Tasia Catchings.  We are also working on follow-up appointment with Dr. Rockey Situ as he is overdue for heart catheterization after CT calcium score

## 2022-09-15 NOTE — Progress Notes (Signed)
Subjective:    Patient ID: [name], male    DOB: 1951-06-03, 71 y.o.   MRN: [id number]  CC: [name] is a 70 y.o. male who presents today for follow up.   HPI: He complains of nocturia past few weeks. He will urinate 2-3 times per night. This is interruptive for sleep.   Denies urinary frequency, dysuria.   urinary hesitancy has resolved since starting flomax.   He occassional has epigastric burning. Endorses belching, flatuence which can be bothersome for him.  Compliant protonix '20mg'$  qd. He drinks caffeine. He thinks sometimes related to overeating. No trouble swallowing, pain with swallowing, abdominal pain, weight loss.   He stopped smoking cigars  He works out and very active during the day. Denies CP.   'Energy is great.'          Trial of Flomax 07/22/2022 for urinary hesitancy.  PSA 1.56.  He never saw urology.  Viagra He is not on vasodilator. Non smoker.   H/o CAD. CT calcium score 06/05/21 with significant stenosis.  Recommended heart cath.   No h/o CHF, DM  No h/o CVA within the last 6 months.   Following with Dr. Tasia Catchings for chronic lymphocytic leukemia.  Last seen 08/05/2022.  Currently on chemotherapy.  Hemoglobin stable 10.5 ( prior 10.3)  Colonoscopy up-to-date Dr. 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 Tasia Catchings   Hyperlipidemia    Low back pain    Peripheral vascular disease [facility])    Past Surgical History:  Procedure Laterality Date   COLONOSCOPY     COLONOSCOPY WITH PROPOFOL N/A 07/23/2015   Procedure: COLONOSCOPY WITH PROPOFOL;  Surgeon: [name], MD;  Location: [facility] ENDOSCOPY;  Service: Endoscopy;  Laterality: N/A;   COLONOSCOPY WITH PROPOFOL N/A 04/20/2022   Procedure: COLONOSCOPY WITH PROPOFOL;  Surgeon: Jonathon Bellows, MD;  Location: [facility] ENDOSCOPY;  Service: Gastroenterology;   Laterality: N/A;   ESOPHAGOGASTRODUODENOSCOPY (EGD) WITH PROPOFOL N/A 08/20/2015   Procedure: ESOPHAGOGASTRODUODENOSCOPY (EGD) WITH PROPOFOL;  Surgeon: [name], MD;  Location: [facility] ENDOSCOPY;  Service: Endoscopy;  Laterality: N/A;   LOWER EXTREMITY ANGIOGRAPHY Left 05/19/2018   Procedure: LOWER EXTREMITY ANGIOGRAPHY;  Surgeon: Algernon Huxley, MD;  Location: [place] CV LAB;  Service: Cardiovascular;  Laterality: Left;   LOWER EXTREMITY ANGIOGRAPHY Right 06/15/2018   Procedure: LOWER EXTREMITY ANGIOGRAPHY;  Surgeon: Algernon Huxley, MD;  Location: [place] CV LAB;  Service: Cardiovascular;  Laterality: Right;   lymp node removal Right 2011   arm   left arm 2015   LYMPH NODE BIOPSY  08/2014   Family History  Problem Relation Age of Onset   Heart attack Father    Kidney cancer Neg Hx        lung cancer   Bladder Cancer Neg Hx    Prostate cancer Neg Hx     Allergies: Penicillins and Latex Current Outpatient Medications on File Prior to Visit  Medication Sig Dispense Refill   hydrocortisone cream 1 % Apply 1 application. topically 2 (two) times daily. 30 g 0   loperamide (IMODIUM) 2 MG capsule Take 1 capsule (2 mg total) by mouth See admin instructions. Initial: 4 mg, followed by 2 mg after each loose stool; maximum: 16 mg/day 30 capsule 0   montelukast (SINGULAIR) 10 MG tablet Take  1 tablet (10 mg total) by mouth See admin instructions. Take 1 tablet on the day of next appointment 1 tablet 0   pantoprazole (PROTONIX) 20 MG tablet Take 1 tablet (20 mg total) by mouth daily. Take 30 min to 1 hr prior to breakfast. Take for 2-3 weeks 30 tablet 1   tamsulosin (FLOMAX) 0.4 MG CAPS capsule Take 1 capsule (0.4 mg total) by mouth daily. Give 30 30 capsule 1   traZODone (DESYREL) 50 MG tablet Take 0.5-1 tablets (25-50 mg total) by mouth at bedtime as needed for sleep. 30 tablet 3   No current facility-administered medications on file prior to visit.    Social History   Tobacco Use    Smoking status: Former    Types: Cigars   Smokeless tobacco: Never   Tobacco comments:    .5 ppd last year, total of 48.5 pack year  Vaping Use   Vaping Use: Never used  Substance Use Topics   Alcohol use: No   Drug use: No    Review of Systems  Constitutional:  Negative for chills and fever.  Respiratory:  Negative for cough.   Cardiovascular:  Negative for chest pain and palpitations.  Gastrointestinal:  Negative for abdominal pain, nausea and vomiting.  Genitourinary:  Negative for difficulty urinating.      Objective:    BP 120/80 (BP Location: Left Arm, Patient Position: Sitting, Cuff Size: Normal)   Pulse (!) 59   Temp 98.1 F (36.7 C) (Oral)   Ht '5\' 7"'$  (1.702 m)   Wt 134 lb 9.6 oz (61.1 kg)   SpO2 96%   BMI 21.08 kg/m  BP Readings from Last 3 Encounters:  09/15/22 120/80  08/20/22 123/67  08/13/22 126/83   Wt Readings from Last 3 Encounters:  09/15/22 134 lb 9.6 oz (61.1 kg)  08/20/22 127 lb 6.8 oz (57.8 kg)  08/05/22 123 lb (55.8 kg)    Physical Exam Vitals reviewed.  Constitutional:      Appearance: He is well-developed.  Cardiovascular:     Rate and Rhythm: Regular rhythm.     Heart sounds: Normal heart sounds.  Pulmonary:     Effort: Pulmonary effort is normal. No respiratory distress.     Breath sounds: Normal breath sounds. No wheezing, rhonchi or rales.  Genitourinary:    Prostate: Enlarged. Not tender.     Comments: On digital exam, prostate felt slightly enlarged.  No masses appreciated Skin:    General: Skin is warm and dry.  Neurological:     Mental Status: He is alert.  Psychiatric:        Speech: Speech normal.        Behavior: Behavior normal.        Assessment & Plan:   Problem List Items Addressed This Visit       Cardiovascular and Mediastinum   Coronary atherosclerosis    Patient has been noncompliant with pravastatin.  Again, today I stressed the importance of remaining on a statin with a known history of coronary  artery disease.  I have opted instead to discontinue pravastatin as he has not been taking and start Crestor 10 mg.  He will have hepatic enzymes and lipid panel checked with labs done from Dr. Tasia Catchings.  We are also working on follow-up appointment with Dr. Rockey Situ as he is overdue for heart catheterization after CT calcium score      Relevant Medications   rosuvastatin (CRESTOR) 10 MG tablet   Other Relevant Orders  Lipid panel     Digestive   Gastroesophageal reflux disease    Symptoms of excessive burping, flatulence and I question if related to suboptimal control of GERD.  Counseled him on lifestyle intervention as it relates to food choices, eating smaller more frequent meals.  He is compliant with Protonix 20 mg daily.  Advised he may trial over-the-counter Pepcid AC once daily.  Also advised he may try either Gas-X or Beano for excessive flatulence and burping to see if this is helpful.         Other   Urinary hesitancy - Primary    Urinary hesitancy has improved on Flomax.  Advised to continue Flomax.  Experiencing nocturia.  He does snore however low suspicion for sleep apnea at this time.  Greater concern for enlarged prostate based on prostate exam today.  I placed referral again to urology.  Of note, patient requested Viagra today.  In the setting of coronary artery disease and pending cardiac cath with Dr. Rockey Situ, advised patient to wait on starting Viagra until he has completed cardiac evaluation.  He verbalized understanding.      Relevant Orders   Ambulatory referral to Urology   Other Visit Diagnoses     Need for immunization against influenza       Relevant Orders   Flu Vaccine QUAD High Dose(Fluad) (Completed)        I am having Earney Hamburg start on rosuvastatin. I am also having him maintain his traZODone, hydrocortisone cream, loperamide, pantoprazole, tamsulosin, and montelukast.   Meds ordered this encounter  Medications   rosuvastatin (CRESTOR) 10 MG tablet     Sig: Take 1 tablet (10 mg total) by mouth daily.    Dispense:  90 tablet    Refill:  3    Order Specific Question:   Supervising Provider    Answer:   Crecencio Mc [[id number]]    Return precautions given.   Risks, benefits, and alternatives of the medications and treatment plan prescribed today were discussed, and patient expressed understanding.   Education regarding symptom management and diagnosis given to patient on AVS.  Continue to follow with [name], FNP for routine health maintenance.   Earney Hamburg and I agreed with plan.   [name], FNP

## 2022-09-15 NOTE — Assessment & Plan Note (Signed)
Symptoms of excessive burping, flatulence and I question if related to suboptimal control of GERD.  Counseled him on lifestyle intervention as it relates to food choices, eating smaller more frequent meals.  He is compliant with Protonix 20 mg daily.  Advised he may trial over-the-counter Pepcid AC once daily.  Also advised he may try either Gas-X or Beano for excessive flatulence and burping to see if this is helpful.

## 2022-09-16 ENCOUNTER — Telehealth: Payer: Self-pay | Admitting: Family

## 2022-09-16 NOTE — Telephone Encounter (Signed)
Call pt  Doesn't appear he has an appt with cardiology  Would you call [facility] dr Rockey Situ and sch f/u appt?    He was supposed to discuss heart catheter   Please help pt coordinate appt

## 2022-09-17 NOTE — Telephone Encounter (Signed)
Spoke to patient in regards to appt with Cardiologist and he already has appt in November. And he stated that they placed him on the cancellation list just in case someone cancelled before then.

## 2022-09-21 NOTE — Telephone Encounter (Signed)
noted 

## 2022-09-29 ENCOUNTER — Encounter: Payer: Self-pay | Admitting: Family

## 2022-10-05 ENCOUNTER — Inpatient Hospital Stay: Payer: Medicare HMO | Attending: Oncology

## 2022-10-05 ENCOUNTER — Inpatient Hospital Stay: Payer: Medicare HMO | Admitting: Oncology

## 2022-10-09 ENCOUNTER — Ambulatory Visit: Payer: Self-pay | Admitting: Urology

## 2022-10-29 ENCOUNTER — Ambulatory Visit: Payer: Medicare HMO

## 2022-11-09 ENCOUNTER — Emergency Department: Payer: Medicare HMO

## 2022-11-09 ENCOUNTER — Ambulatory Visit (INDEPENDENT_AMBULATORY_CARE_PROVIDER_SITE_OTHER): Payer: Medicare HMO | Admitting: Family

## 2022-11-09 ENCOUNTER — Encounter: Payer: Self-pay | Admitting: Intensive Care

## 2022-11-09 ENCOUNTER — Encounter: Payer: Self-pay | Admitting: Family

## 2022-11-09 ENCOUNTER — Other Ambulatory Visit: Payer: Self-pay

## 2022-11-09 ENCOUNTER — Emergency Department
Admission: EM | Admit: 2022-11-09 | Discharge: 2022-11-09 | Disposition: A | Discharge status: Home / Self Care | Payer: Medicare HMO | Attending: Emergency Medicine | Admitting: Emergency Medicine

## 2022-11-09 VITALS — BP 118/82 | HR 61 | Temp 97.5°F | Wt 142.4 lb

## 2022-11-09 DIAGNOSIS — R109 Unspecified abdominal pain: Secondary | ICD-10-CM | POA: Diagnosis not present

## 2022-11-09 DIAGNOSIS — G4700 Insomnia, unspecified: Secondary | ICD-10-CM

## 2022-11-09 DIAGNOSIS — Z856 Personal history of leukemia: Secondary | ICD-10-CM | POA: Diagnosis not present

## 2022-11-09 DIAGNOSIS — G8929 Other chronic pain: Secondary | ICD-10-CM

## 2022-11-09 DIAGNOSIS — R001 Bradycardia, unspecified: Secondary | ICD-10-CM | POA: Diagnosis not present

## 2022-11-09 DIAGNOSIS — K219 Gastro-esophageal reflux disease without esophagitis: Secondary | ICD-10-CM

## 2022-11-09 DIAGNOSIS — R350 Frequency of micturition: Secondary | ICD-10-CM | POA: Diagnosis not present

## 2022-11-09 DIAGNOSIS — K8020 Calculus of gallbladder without cholecystitis without obstruction: Secondary | ICD-10-CM | POA: Diagnosis not present

## 2022-11-09 DIAGNOSIS — R0602 Shortness of breath: Secondary | ICD-10-CM | POA: Diagnosis not present

## 2022-11-09 DIAGNOSIS — M545 Low back pain, unspecified: Secondary | ICD-10-CM | POA: Insufficient documentation

## 2022-11-09 DIAGNOSIS — R600 Localized edema: Secondary | ICD-10-CM | POA: Insufficient documentation

## 2022-11-09 DIAGNOSIS — D72829 Elevated white blood cell count, unspecified: Secondary | ICD-10-CM | POA: Insufficient documentation

## 2022-11-09 DIAGNOSIS — R1011 Right upper quadrant pain: Secondary | ICD-10-CM | POA: Diagnosis not present

## 2022-11-09 DIAGNOSIS — R590 Localized enlarged lymph nodes: Secondary | ICD-10-CM | POA: Diagnosis not present

## 2022-11-09 DIAGNOSIS — I2510 Atherosclerotic heart disease of native coronary artery without angina pectoris: Secondary | ICD-10-CM

## 2022-11-09 DIAGNOSIS — N189 Chronic kidney disease, unspecified: Secondary | ICD-10-CM | POA: Insufficient documentation

## 2022-11-09 DIAGNOSIS — I700 Atherosclerosis of aorta: Secondary | ICD-10-CM | POA: Diagnosis not present

## 2022-11-09 DIAGNOSIS — R1110 Vomiting, unspecified: Secondary | ICD-10-CM | POA: Diagnosis not present

## 2022-11-09 DIAGNOSIS — R1900 Intra-abdominal and pelvic swelling, mass and lump, unspecified site: Secondary | ICD-10-CM | POA: Diagnosis present

## 2022-11-09 DIAGNOSIS — R140 Abdominal distension (gaseous): Secondary | ICD-10-CM

## 2022-11-09 DIAGNOSIS — R0609 Other forms of dyspnea: Secondary | ICD-10-CM | POA: Insufficient documentation

## 2022-11-09 DIAGNOSIS — R609 Edema, unspecified: Secondary | ICD-10-CM

## 2022-11-09 LAB — CBC WITH DIFFERENTIAL/PLATELET
Abs Immature Granulocytes: 0.19 10*3/uL — ABNORMAL HIGH (ref 0.00–0.07)
Basophils Absolute: 0.2 10*3/uL — ABNORMAL HIGH (ref 0.0–0.1)
Basophils Relative: 0 %
Eosinophils Absolute: 0.2 10*3/uL (ref 0.0–0.5)
Eosinophils Relative: 1 %
HCT: 29.3 % — ABNORMAL LOW (ref 39.0–52.0)
Hemoglobin: 9.1 g/dL — ABNORMAL LOW (ref 13.0–17.0)
Immature Granulocytes: 1 %
Lymphocytes Relative: 71 %
Lymphs Abs: 28.6 10*3/uL — ABNORMAL HIGH (ref 0.7–4.0)
MCH: 25.6 pg — ABNORMAL LOW (ref 26.0–34.0)
MCHC: 31.1 g/dL (ref 30.0–36.0)
MCV: 82.5 fL (ref 80.0–100.0)
Monocytes Absolute: 9.4 10*3/uL — ABNORMAL HIGH (ref 0.1–1.0)
Monocytes Relative: 23 %
Neutro Abs: 1.5 10*3/uL — ABNORMAL LOW (ref 1.7–7.7)
Neutrophils Relative %: 4 %
Platelets: 152 10*3/uL (ref 150–400)
RBC: 3.55 MIL/uL — ABNORMAL LOW (ref 4.22–5.81)
RDW: 15.9 % — ABNORMAL HIGH (ref 11.5–15.5)
Smear Review: NORMAL
WBC: 40 10*3/uL — ABNORMAL HIGH (ref 4.0–10.5)
nRBC: 0.1 % (ref 0.0–0.2)

## 2022-11-09 LAB — COMPREHENSIVE METABOLIC PANEL
ALT: 13 U/L (ref 0–44)
AST: 27 U/L (ref 15–41)
Albumin: 3.8 g/dL (ref 3.5–5.0)
Alkaline Phosphatase: 75 U/L (ref 38–126)
Anion gap: 5 (ref 5–15)
BUN: 21 mg/dL (ref 8–23)
CO2: 23 mmol/L (ref 22–32)
Calcium: 9.1 mg/dL (ref 8.9–10.3)
Chloride: 115 mmol/L — ABNORMAL HIGH (ref 98–111)
Creatinine, Ser: 1.51 mg/dL — ABNORMAL HIGH (ref 0.61–1.24)
GFR, Estimated: 50 mL/min — ABNORMAL LOW (ref >60)
Glucose, Bld: 119 mg/dL — ABNORMAL HIGH (ref 70–99)
Potassium: 4.1 mmol/L (ref 3.5–5.1)
Sodium: 143 mmol/L (ref 135–145)
Total Bilirubin: 1.2 mg/dL (ref 0.3–1.2)
Total Protein: 6.1 g/dL — ABNORMAL LOW (ref 6.5–8.1)

## 2022-11-09 LAB — LIPASE, BLOOD: Lipase: 40 U/L (ref 11–51)

## 2022-11-09 LAB — URINALYSIS, ROUTINE W REFLEX MICROSCOPIC
Bacteria, UA: NONE SEEN
Bilirubin Urine: NEGATIVE
Glucose, UA: NEGATIVE mg/dL
Ketones, ur: NEGATIVE mg/dL
Leukocytes,Ua: NEGATIVE
Nitrite: NEGATIVE
Protein, ur: 30 mg/dL — AB
Specific Gravity, Urine: 1.018 (ref 1.005–1.030)
Squamous Epithelial / HPF: NONE SEEN (ref 0–5)
pH: 5 (ref 5.0–8.0)

## 2022-11-09 LAB — BRAIN NATRIURETIC PEPTIDE: B Natriuretic Peptide: 132 pg/mL — ABNORMAL HIGH (ref 0.0–100.0)

## 2022-11-09 LAB — TROPONIN I (HIGH SENSITIVITY)
Troponin I (High Sensitivity): 6 ng/L (ref <18)
Troponin I (High Sensitivity): 6 ng/L (ref <18)

## 2022-11-09 LAB — PATHOLOGIST SMEAR REVIEW

## 2022-11-09 MED ORDER — PANTOPRAZOLE SODIUM 40 MG PO TBEC: 40.0000 mg | DELAYED_RELEASE_TABLET | Freq: Every day | ORAL | 3 refills | Status: DC

## 2022-11-09 MED ORDER — MONTELUKAST SODIUM 10 MG PO TABS: 10.0000 mg | ORAL_TABLET | ORAL | 0 refills | Status: DC

## 2022-11-09 MED ORDER — ROSUVASTATIN CALCIUM 10 MG PO TABS: 10.0000 mg | ORAL_TABLET | Freq: Every day | ORAL | 3 refills | Status: DC

## 2022-11-09 MED ORDER — IOHEXOL 300 MG/ML  SOLN
80.0000 mL | Freq: Once | INTRAMUSCULAR | Status: AC | PRN
  Administered 2022-11-09: 80 mL via INTRAVENOUS

## 2022-11-09 MED ORDER — FUROSEMIDE 10 MG/ML IJ SOLN
20.0000 mg | Freq: Once | INTRAMUSCULAR | Status: AC
  Administered 2022-11-09: 20 mg via INTRAVENOUS
  Filled 2022-11-09: qty 4

## 2022-11-09 MED ORDER — FUROSEMIDE 20 MG PO TABS: 20.0000 mg | ORAL_TABLET | Freq: Every day | ORAL | 0 refills | Status: DC

## 2022-11-09 MED ORDER — POTASSIUM CHLORIDE CRYS ER 20 MEQ PO TBCR: 20.0000 meq | EXTENDED_RELEASE_TABLET | Freq: Every day | ORAL | 0 refills | Status: DC

## 2022-11-09 MED ORDER — TRAZODONE HCL 50 MG PO TABS: 25.0000 mg | ORAL_TABLET | Freq: Every evening | ORAL | 3 refills | Status: DC | PRN

## 2022-11-09 NOTE — Assessment & Plan Note (Signed)
Low back was not examined during visit today as we focused on shortness of breath.  At follow-up would like to arrange baseline lumbar x-ray.

## 2022-11-09 NOTE — Assessment & Plan Note (Signed)
Uncontrolled. Advised to increase protonix to '40mg'$  qam. Will follow.

## 2022-11-09 NOTE — Patient Instructions (Signed)
Please go straight to emergency room as your oxygen is dropping to 88% when walking . Normal is at least above 90% or closer to 100%.   I would like emergency room to evaluate abdominal distention and shortness of breath. I suspect that you may need oxygen at discharge.    Stay safe and I will let [facility] emergency room know you are coming.

## 2022-11-09 NOTE — ED Provider Triage Note (Signed)
Emergency Medicine Provider Triage Evaluation Note  [name] , a 71 y.o. male  was evaluated in triage.  Pt complains of shortness of breath, abdominal swelling, cannot feels like he cannot eat and has vomiting.  Some swelling in lower extremities also.  Review of Systems  Positive:  Negative:   Physical Exam  BP 133/84 (BP Location: Left Arm)   Pulse 61   Temp 97.8 F (36.6 C) (Oral)   Resp 20   SpO2 100%  Gen:   Awake, no distress   Resp:  Normal effort  MSK:   Moves extremities without difficulty  Other:  Abdomen tender right upper quadrant, does appear to be swollen  Medical Decision Making  Medically screening exam initiated at 2:21 PM.  Appropriate orders placed.  Aurel Nguyen was informed that the remainder of the evaluation will be completed by another provider, this initial triage assessment does not replace that evaluation, and the importance of remaining in the ED until their evaluation is complete.  Labs and imaging ordered, shortness of breath added chest x-ray and Valere Dross, PA-C 11/09/22 1421

## 2022-11-09 NOTE — Progress Notes (Signed)
Subjective:    Patient ID: [name], male    DOB: 1951-07-12, 71 y.o.   MRN: [id number]  CC: [name] is a 71 y.o. male who presents today for an acute visit.    HPI: Complains of abdominal bloating and early satiety, epigastric burning past couple of weeks. Worse after eating. Endorses excessive gas.   BM are daily.   He has gained weight.   Compliant with protonix '20mg'$  QD which is helpful.  No caffeine. No NSAIDs  No fever, diarrhea.   SOB with exertion has increased x 2 weeks. Previously used to exercise but no longer can as gets winded when walking around house.  He feels around his ankles that he is swollen, bilaterally.   No cp, left arm numbness, jaw pain, cough. No regular exercise.  No recent travel. No SOB with talking.   He has not had cardiac cath.   He also complains of low back for past 2 weeks, improved past couple of days. No back pain today. Occasional right buttocks pain. No numbness in legs, saddle anesthesia, urinary or bowel incontinence.  Pain with bending  Tylenol '500mg'$  with relief.    H/o CLL ( undergoing chemotherapy, Dr Tasia Catchings, last infusion 08/2022) , CAD, PVD  Follow up with Dr Tasia Catchings 11/18/22, Dr Rockey Situ 11/30/22  He smokes cigars every now and then. 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 Tasia Catchings   Hyperlipidemia    Low back pain    Peripheral vascular disease [facility])    Past Surgical History:  Procedure Laterality Date   COLONOSCOPY     COLONOSCOPY WITH PROPOFOL N/A 07/23/2015   Procedure: COLONOSCOPY WITH PROPOFOL;  Surgeon: [name], MD;  Location: [facility] ENDOSCOPY;  Service: Endoscopy;  Laterality: N/A;   COLONOSCOPY WITH PROPOFOL N/A 04/20/2022   Procedure: COLONOSCOPY WITH PROPOFOL;  Surgeon: Jonathon Bellows, MD;  Location: [facility] ENDOSCOPY;   Service: Gastroenterology;  Laterality: N/A;   ESOPHAGOGASTRODUODENOSCOPY (EGD) WITH PROPOFOL N/A 08/20/2015   Procedure: ESOPHAGOGASTRODUODENOSCOPY (EGD) WITH PROPOFOL;  Surgeon: [name], MD;  Location: [facility] ENDOSCOPY;  Service: Endoscopy;  Laterality: N/A;   LOWER EXTREMITY ANGIOGRAPHY Left 05/19/2018   Procedure: LOWER EXTREMITY ANGIOGRAPHY;  Surgeon: Algernon Huxley, MD;  Location: [place] CV LAB;  Service: Cardiovascular;  Laterality: Left;   LOWER EXTREMITY ANGIOGRAPHY Right 06/15/2018   Procedure: LOWER EXTREMITY ANGIOGRAPHY;  Surgeon: Algernon Huxley, MD;  Location: [place] CV LAB;  Service: Cardiovascular;  Laterality: Right;   lymp node removal Right 2011   arm   left arm 2015   LYMPH NODE BIOPSY  08/2014   Family History  Problem Relation Age of Onset   Heart attack Father    Kidney cancer Neg Hx        lung cancer   Bladder Cancer Neg Hx    Prostate cancer Neg Hx     Allergies: Penicillins and Latex Current Outpatient Medications on File Prior to Visit  Medication Sig Dispense Refill   hydrocortisone cream 1 % Apply 1 application. topically 2 (two) times daily. 30 g 0   montelukast (SINGULAIR) 10 MG tablet Take 1 tablet (10 mg total) by mouth See admin instructions. Take 1 tablet on the day of next appointment 1 tablet 0   rosuvastatin (CRESTOR)  10 MG tablet Take 1 tablet (10 mg total) by mouth daily. 90 tablet 3   tamsulosin (FLOMAX) 0.4 MG CAPS capsule Take 1 capsule (0.4 mg total) by mouth daily. Give 30 30 capsule 1   traZODone (DESYREL) 50 MG tablet Take 0.5-1 tablets (25-50 mg total) by mouth at bedtime as needed for sleep. 30 tablet 3   No current facility-administered medications on file prior to visit.    Social History   Tobacco Use   Smoking status: Former    Types: Cigars   Smokeless tobacco: Never   Tobacco comments:    .5 ppd last year, total of 48.5 pack year  Vaping Use   Vaping Use: Never used  Substance Use Topics   Alcohol use: No    Drug use: No    Review of Systems  Constitutional:  Negative for chills and fever.  Respiratory:  Positive for shortness of breath. Negative for cough.   Cardiovascular:  Positive for leg swelling. Negative for chest pain and palpitations.  Gastrointestinal:  Positive for abdominal distention. Negative for blood in stool, constipation, diarrhea, nausea and vomiting.  Musculoskeletal:  Positive for back pain.      Objective:    BP 118/82 (BP Location: Left Arm, Patient Position: Sitting, Cuff Size: Normal)   Pulse 61   Temp (!) 97.5 F (36.4 C) (Oral)   Wt 142 lb 6.4 oz (64.6 kg)   SpO2 99%   BMI 22.30 kg/m   Wt Readings from Last 3 Encounters:  11/09/22 142 lb 6.4 oz (64.6 kg)  09/15/22 134 lb 9.6 oz (61.1 kg)  08/20/22 127 lb 6.8 oz (57.8 kg)    Physical Exam Vitals reviewed.  Constitutional:      Appearance: Normal appearance. He is well-developed.  Cardiovascular:     Rate and Rhythm: Regular rhythm.     Heart sounds: Normal heart sounds.  Pulmonary:     Effort: Pulmonary effort is normal. No respiratory distress.     Breath sounds: Normal breath sounds. No wheezing or rales.  Abdominal:     General: Bowel sounds are normal. There is distension.     Palpations: Abdomen is not rigid. There is no fluid wave or mass.     Tenderness: There is no abdominal tenderness. There is no guarding or rebound. Negative signs include Murphy's sign and McBurney's sign.     Comments: Round, distended and firm abdomen. No pain elicited but difficult to press.   Skin:    General: Skin is warm and dry.  Neurological:     Mental Status: He is alert.  Psychiatric:        Speech: Speech normal.        Behavior: Behavior normal.        Assessment & Plan:   Problem List Items Addressed This Visit       Digestive   Gastroesophageal reflux disease    Uncontrolled. Advised to increase protonix to '40mg'$  qam. Will follow.       Relevant Medications   pantoprazole (PROTONIX) 40  MG tablet     Other   Abdominal distension    Concerning abdominal distension on exam. Patient doesn't endorse constipation. H/o CLL. Advised him to bring this to ED provider attention as well as had planned to arrange CT a/p to evaluate for ascites, liver disease.  However with hypoxia, advised patient safest to have higher level of care in ED and imaging , labs arranged while in the hospital.  DOE (dyspnea on exertion)    Patient is in no acute respiratory distress while in the exam room.  He is not labored in his speech.  However when we walked down the hallway with a pulse oximeter and oxygen dropped from 99 to 88% and patient felt very winded , difficultly speaking with me  within a minute or so of walking.  EKG shows sinus bradycardia and unchanged compared to previous EKG 05/01/21, 06/15/22.  Patient has history of cigarette smoking and is currently smoking cigars, while infrequently.  No evidence of lung sounds, appreciable leg swelling.  Previous echocardiogram showed normal ejection fraction.  I am concerned hypoxia is related to history of COPD which would warrant hospitalization stays at the social work can arrange oxygen at home. Suspect he will require supplemental O2 with ambulation. He also has h/o anemia ( hemoglobin 10 07/2022).  He declined emergency transport from our office.  AGAINST MEDICAL ADVICE due to safety in setting of hypoxia, patient  states he will return home to check on his niece and allow his wife to drive him to [facility] emergency room.  Triage report has been given to [place].       Low back pain    Low back was not examined during visit today as we focused on shortness of breath.  At follow-up would like to arrange baseline lumbar x-ray.      Other Visit Diagnoses     Shortness of breath    -  Primary   Relevant Orders   EKG 12-Lead (Completed)         I have discontinued Luby Tengan's loperamide and pantoprazole. I am also having him start on  pantoprazole. Additionally, I am having him maintain his traZODone, hydrocortisone cream, tamsulosin, montelukast, and rosuvastatin.   Meds ordered this encounter  Medications   pantoprazole (PROTONIX) 40 MG tablet    Sig: Take 1 tablet (40 mg total) by mouth daily. Prior to breakfast, 30 min to 1 hr    Dispense:  30 tablet    Refill:  3    Order Specific Question:   Supervising Provider    Answer:   Crecencio Mc [[id number]]    Return precautions given.   Risks, benefits, and alternatives of the medications and treatment plan prescribed today were discussed, and patient expressed understanding.   Education regarding symptom management and diagnosis given to patient on AVS.  Continue to follow with [name], FNP for routine health maintenance.   Earney Hamburg and I agreed with plan.   [name], FNP  I have spent 41 minutes with a patient including precharting, exam, reviewing medical records, and discussion plan of care included ED .

## 2022-11-09 NOTE — Assessment & Plan Note (Addendum)
Patient is in no acute respiratory distress while in the exam room.  He is not labored in his speech.  However when we walked down the hallway with a pulse oximeter and oxygen dropped from 99 to 88% and patient felt very winded , difficultly speaking with me  within a minute or so of walking.  EKG shows sinus bradycardia and unchanged compared to previous EKG 05/01/21, 06/15/22.  Patient has history of cigarette smoking and is currently smoking cigars, while infrequently.  No evidence of lung sounds, appreciable leg swelling.  Previous echocardiogram showed normal ejection fraction.  I am concerned hypoxia is related to history of COPD which would warrant hospitalization stays at the social work can arrange oxygen at home. Suspect he will require supplemental O2 with ambulation. He also has h/o anemia ( hemoglobin 10 07/2022).  He declined emergency transport from our office.  AGAINST MEDICAL ADVICE due to safety in setting of hypoxia, patient  states he will return home to check on his niece and allow his wife to drive him to [facility] emergency room.  Triage report has been given to [place].

## 2022-11-09 NOTE — ED Triage Notes (Signed)
Patient c/o sob and abdominal pain/bloating. Reports some N/V/D.

## 2022-11-09 NOTE — ED Provider Notes (Signed)
[facility] Provider Note    Event Date/Time   First MD Initiated Contact with Patient 11/09/22 1711     (approximate)   History   Shortness of Breath   HPI  [name] is a 71 y.o. male here with shortness of breath.  Patient has history of CLL.  He states that for the last week or 2, he has had progressive worsening abdominal swelling and distention.  He had associated shortness of breath.  He states that he has had early satiety, some shortness of breath, and difficulty sleeping.  He otherwise has been overall feeling well.  No fevers.  He had normal bowel movements.  He does state that he has had increased urinary frequency and some fullness in his bladder area.  No history of retention.  No rashes.     Physical Exam   Triage Vital Signs: ED Triage Vitals  Enc Vitals Group     BP 11/09/22 1418 133/84     Pulse Rate 11/09/22 1418 61     Resp 11/09/22 1418 20     Temp 11/09/22 1418 97.8 F (36.6 C)     Temp Source 11/09/22 1418 Oral     SpO2 11/09/22 1418 100 %     Weight 11/09/22 1420 142 lb 6.4 oz (64.6 kg)     Height 11/09/22 1420 '5\' 8"'$  (1.727 m)     Head Circumference --      Peak Flow --      Pain Score 11/09/22 1420 0     Pain Loc --      Pain Edu? --      Excl. in [place]? --     Most recent vital signs: Vitals:   11/09/22 2014 11/09/22 2051  BP:    Pulse:  (!) 51  Resp:  20  Temp: 97.7 F (36.5 C) 98 F (36.7 C)  SpO2:  99%     General: Awake, no distress.  CV:  Good peripheral perfusion.  Regular rate and rhythm.  No murmurs. Resp:  Normal effort.  Lungs clear to auscultation bilaterally. Abd:  Mild distention noted, mild ascites.  No rebound or guarding.  No peritonitis. Other:  Trace pitting edema bilaterally.   ED Results / Procedures / Treatments   Labs (all labs ordered are listed, but only abnormal results are displayed) Labs Reviewed  COMPREHENSIVE METABOLIC PANEL - Abnormal; Notable for the following  components:      Result Value   Chloride 115 (*)    Glucose, Bld 119 (*)    Creatinine, Ser 1.51 (*)    Total Protein 6.1 (*)    GFR, Estimated 50 (*)    All other components within normal limits  BRAIN NATRIURETIC PEPTIDE - Abnormal; Notable for the following components:   B Natriuretic Peptide 132.0 (*)    All other components within normal limits  CBC WITH DIFFERENTIAL/PLATELET - Abnormal; Notable for the following components:   WBC 40.0 (*)    RBC 3.55 (*)    Hemoglobin 9.1 (*)    HCT 29.3 (*)    MCH 25.6 (*)    RDW 15.9 (*)    Neutro Abs 1.5 (*)    Lymphs Abs 28.6 (*)    Monocytes Absolute 9.4 (*)    Basophils Absolute 0.2 (*)    Abs Immature Granulocytes 0.19 (*)    All other components within normal limits  URINALYSIS, ROUTINE W REFLEX MICROSCOPIC - Abnormal; Notable for the following components:  Color, Urine YELLOW (*)    APPearance CLEAR (*)    Hgb urine dipstick SMALL (*)    Protein, ur 30 (*)    All other components within normal limits  LIPASE, BLOOD  PATHOLOGIST SMEAR REVIEW  TROPONIN I (HIGH SENSITIVITY)  TROPONIN I (HIGH SENSITIVITY)     EKG Sinus rhythm, trickle rate 52.  QRS 80, QTc 394.  Baseline artifact.  No acute ST elevations.   RADIOLOGY Chest x-ray: Low lung volumes with bronchovascular crowding CT abdomen/pelvis: Progressive bulky retroperitoneal mesenteric lymphadenopathy, no obstruction small amount of free pelvic fluid Ultrasound abdomen: Cholelithiasis, possible gallbladder polyp    I also independently reviewed and agree with radiologist interpretations.   PROCEDURES:  Critical Care performed: No    MEDICATIONS ORDERED IN ED: Medications  iohexol (OMNIPAQUE) 300 MG/ML solution 80 mL (80 mLs Intravenous Contrast Given 11/09/22 1823)  furosemide (LASIX) injection 20 mg (20 mg Intravenous Given 11/09/22 2008)     IMPRESSION / MDM / ASSESSMENT AND PLAN / ED COURSE  I reviewed the triage vital signs and the nursing notes.                                This patient presents to the ED for concern of abdominal distention, shortness of breath,, this involves an extensive number of treatment options, and is a complaint that carries with it a high risk of complications and morbidity.  The differential diagnosis includes CHF, worsening peritoneal lymphadenopathy, obstruction, urinary retention with bladder distention, renal failure, liver failure   Co morbidities that complicate the patient evaluation  CLL, peripheral vascular disease, history of diastolic dysfunction on prior echocardiogram   Additional history obtained:  Additional history obtained from significant other External records from outside source obtained and reviewed including echocardiogram 05/2022 which showed possible diastolic dysfunction, oncology notes noting treatment completion in August   Lab Tests:  I Ordered, and personally interpreted labs.  The pertinent results include:   CBC shows white count 40,000, increased from 6.9 on 8/31. BNP elevated at 132. Troponin 6  CMP with baseline mild CKD, possibly slightly above last check. Lipase normal   Imaging Studies ordered:  I ordered imaging studies including CT abdomen/pelvis, chest x-ray, ultrasound I independently visualized and interpreted imaging which showed: Chest x-ray shows bronchovascular crowding.  CT shows bulky lymphadenopathy which I suspect is contributing to his distention.  Ultrasound shows cholelithiasis without cholecystitis. I agree with the radiologist interpretation   Cardiac Monitoring: / EKG:  The patient was maintained on a cardiac monitor.  I personally viewed and interpreted the cardiac monitored which showed an underlying rhythm of: Sinus bradycardia   Consultations Obtained:  I requested consultation with the oncologist on-call,  and discussed lab and imaging findings as well as pertinent plan - they recommend: Outpatient follow-up   Problem List / ED  Course / Critical interventions / Medication management  Abdominal distention, edema LIkely related to combination of intra-abdominal and RP lymphadenopathy and mild CHF. Pt is otherwise very well appearing. No signs of pulmonary edema. He does have elevated BNP. No CP or signs of ischemia and EKG is normal. Will give lasix. DIscussed case with on call oncologist and will have him f/u with Dr. Tasia Catchings - no emergent treatment needed for his leukocytosis. He has no fever or signs to suggest infection. I ordered medication including lasix  for swelling  Reevaluation of the patient after these medicines showed that the  patient improved I have reviewed the patients home medicines and have made adjustments as needed   FINAL CLINICAL IMPRESSION(S) / ED DIAGNOSES   Final diagnoses:  Peripheral edema  Abdominal lymphadenopathy     Rx / DC Orders   ED Discharge Orders          Ordered    furosemide (LASIX) 20 MG tablet  Daily        11/09/22 2030    potassium chloride SA (KLOR-CON M) 20 MEQ tablet  Daily        11/09/22 2030             Note:  This document was prepared using Dragon voice recognition software and may include unintentional dictation errors.   Duffy Bruce, MD 11/10/22 [phone]

## 2022-11-09 NOTE — Discharge Instructions (Signed)
Take the lasix in the mornings with a dose of potassium  Call Dr. Tasia Catchings and Dr. Rockey Situ for follow-up in the next week

## 2022-11-09 NOTE — Assessment & Plan Note (Signed)
Concerning abdominal distension on exam. Patient doesn't endorse constipation. H/o CLL. Advised him to bring this to ED provider attention as well as had planned to arrange CT a/p to evaluate for ascites, liver disease.  However with hypoxia, advised patient safest to have higher level of care in ED and imaging , labs arranged while in the hospital.

## 2022-11-10 ENCOUNTER — Telehealth: Payer: Self-pay | Admitting: Family

## 2022-11-10 DIAGNOSIS — R0609 Other forms of dyspnea: Secondary | ICD-10-CM

## 2022-11-10 NOTE — Telephone Encounter (Signed)
Call pt  Please see how patient is feeling after being seen emergency room yesterday.  I see he was provided with Lasix.   Please reiterate the importance of follow-up with Dr. Tasia Catchings and Dr. Rockey Situ.   I am concerned in regards to his shortness of breath particularly with walking.  I would recommend a pulmonology consult as I still think he may require oxygen with ambulation.  I have placed this referral.  Let us know if you dont hear back within a week in regards to an appointment being scheduled.   So that you are aware, if you are [facility] MyChart user , please pay attention to your MyChart messages as you may receive a MyChart message with a phone number to call and schedule this test/appointment own your own from our referral coordinator. This is a new process so I do not want you to miss this message.  If you are not a MyChart user, you will receive a phone call.

## 2022-11-10 NOTE — Telephone Encounter (Signed)
Called to speak to patient but was unable to leave VM at the time.

## 2022-11-18 ENCOUNTER — Encounter: Payer: Self-pay | Admitting: Oncology

## 2022-11-18 ENCOUNTER — Other Ambulatory Visit
Admission: RE | Admit: 2022-11-18 | Discharge: 2022-11-18 | Disposition: A | Discharge status: Home / Self Care | Payer: Medicare HMO | Source: Ambulatory Visit | Attending: Student in an Organized Health Care Education/Training Program | Admitting: Student in an Organized Health Care Education/Training Program

## 2022-11-18 ENCOUNTER — Telehealth: Payer: Self-pay | Admitting: *Deleted

## 2022-11-18 ENCOUNTER — Inpatient Hospital Stay (HOSPITAL_BASED_OUTPATIENT_CLINIC_OR_DEPARTMENT_OTHER): Payer: Medicare HMO | Admitting: Oncology

## 2022-11-18 ENCOUNTER — Telehealth: Payer: Self-pay

## 2022-11-18 ENCOUNTER — Encounter: Payer: Self-pay | Admitting: Student in an Organized Health Care Education/Training Program

## 2022-11-18 ENCOUNTER — Ambulatory Visit: Payer: Medicare HMO | Admitting: Student in an Organized Health Care Education/Training Program

## 2022-11-18 ENCOUNTER — Inpatient Hospital Stay: Payer: Medicare HMO | Attending: Oncology

## 2022-11-18 VITALS — BP 139/81 | HR 51 | Temp 97.8°F | Resp 18 | Wt 142.6 lb

## 2022-11-18 VITALS — BP 130/70 | HR 55 | Temp 97.8°F | Ht 68.0 in | Wt 144.0 lb

## 2022-11-18 DIAGNOSIS — D649 Anemia, unspecified: Secondary | ICD-10-CM | POA: Insufficient documentation

## 2022-11-18 DIAGNOSIS — C9110 Chronic lymphocytic leukemia of B-cell type not having achieved remission: Secondary | ICD-10-CM | POA: Diagnosis not present

## 2022-11-18 DIAGNOSIS — R0602 Shortness of breath: Secondary | ICD-10-CM

## 2022-11-18 DIAGNOSIS — R1900 Intra-abdominal and pelvic swelling, mass and lump, unspecified site: Secondary | ICD-10-CM | POA: Diagnosis not present

## 2022-11-18 DIAGNOSIS — J432 Centrilobular emphysema: Secondary | ICD-10-CM

## 2022-11-18 DIAGNOSIS — Z720 Tobacco use: Secondary | ICD-10-CM

## 2022-11-18 DIAGNOSIS — Z91199 Patient's noncompliance with other medical treatment and regimen due to unspecified reason: Secondary | ICD-10-CM

## 2022-11-18 LAB — COMPREHENSIVE METABOLIC PANEL
ALT: 14 U/L (ref 0–44)
AST: 25 U/L (ref 15–41)
Albumin: 4.1 g/dL (ref 3.5–5.0)
Alkaline Phosphatase: 87 U/L (ref 38–126)
Anion gap: 8 (ref 5–15)
BUN: 22 mg/dL (ref 8–23)
CO2: 22 mmol/L (ref 22–32)
Calcium: 9 mg/dL (ref 8.9–10.3)
Chloride: 109 mmol/L (ref 98–111)
Creatinine, Ser: 1.81 mg/dL — ABNORMAL HIGH (ref 0.61–1.24)
GFR, Estimated: 40 mL/min — ABNORMAL LOW (ref >60)
Glucose, Bld: 90 mg/dL (ref 70–99)
Potassium: 4.5 mmol/L (ref 3.5–5.1)
Sodium: 139 mmol/L (ref 135–145)
Total Bilirubin: 1 mg/dL (ref 0.3–1.2)
Total Protein: 6.6 g/dL (ref 6.5–8.1)

## 2022-11-18 LAB — CBC WITH DIFFERENTIAL/PLATELET
Abs Immature Granulocytes: 0.17 10*3/uL — ABNORMAL HIGH (ref 0.00–0.07)
Abs Immature Granulocytes: 0.19 10*3/uL — ABNORMAL HIGH (ref 0.00–0.07)
Basophils Absolute: 0.1 10*3/uL (ref 0.0–0.1)
Basophils Absolute: 0.2 10*3/uL — ABNORMAL HIGH (ref 0.0–0.1)
Basophils Relative: 0 %
Basophils Relative: 0 %
Eosinophils Absolute: 0.2 10*3/uL (ref 0.0–0.5)
Eosinophils Absolute: 0.2 10*3/uL (ref 0.0–0.5)
Eosinophils Relative: 0 %
Eosinophils Relative: 0 %
HCT: 28.7 % — ABNORMAL LOW (ref 39.0–52.0)
HCT: 29.3 % — ABNORMAL LOW (ref 39.0–52.0)
Hemoglobin: 8.7 g/dL — ABNORMAL LOW (ref 13.0–17.0)
Hemoglobin: 8.8 g/dL — ABNORMAL LOW (ref 13.0–17.0)
Immature Granulocytes: 0 %
Immature Granulocytes: 0 %
Lymphocytes Relative: 69 %
Lymphocytes Relative: 77 %
Lymphs Abs: 41.8 10*3/uL — ABNORMAL HIGH (ref 0.7–4.0)
Lymphs Abs: 48.6 10*3/uL — ABNORMAL HIGH (ref 0.7–4.0)
MCH: 24.9 pg — ABNORMAL LOW (ref 26.0–34.0)
MCH: 25.4 pg — ABNORMAL LOW (ref 26.0–34.0)
MCHC: 29.7 g/dL — ABNORMAL LOW (ref 30.0–36.0)
MCHC: 30.7 g/dL (ref 30.0–36.0)
MCV: 82.9 fL (ref 80.0–100.0)
MCV: 83.7 fL (ref 80.0–100.0)
Monocytes Absolute: 13.5 10*3/uL — ABNORMAL HIGH (ref 0.1–1.0)
Monocytes Absolute: 17 10*3/uL — ABNORMAL HIGH (ref 0.1–1.0)
Monocytes Relative: 21 %
Monocytes Relative: 28 %
Neutro Abs: 1.5 10*3/uL — ABNORMAL LOW (ref 1.7–7.7)
Neutro Abs: 1.5 10*3/uL — ABNORMAL LOW (ref 1.7–7.7)
Neutrophils Relative %: 2 %
Neutrophils Relative %: 3 %
Platelets: 139 10*3/uL — ABNORMAL LOW (ref 150–400)
Platelets: 144 10*3/uL — ABNORMAL LOW (ref 150–400)
RBC: 3.46 MIL/uL — ABNORMAL LOW (ref 4.22–5.81)
RBC: 3.5 MIL/uL — ABNORMAL LOW (ref 4.22–5.81)
RDW: 16.3 % — ABNORMAL HIGH (ref 11.5–15.5)
RDW: 16.5 % — ABNORMAL HIGH (ref 11.5–15.5)
Smear Review: NORMAL
Smear Review: NORMAL
WBC Morphology: ABNORMAL
WBC: 60.8 10*3/uL (ref 4.0–10.5)
WBC: 64.2 10*3/uL (ref 4.0–10.5)
nRBC: 0 % (ref 0.0–0.2)
nRBC: 0 % (ref 0.0–0.2)

## 2022-11-18 LAB — BASIC METABOLIC PANEL
Anion gap: 7 (ref 5–15)
BUN: 21 mg/dL (ref 8–23)
CO2: 25 mmol/L (ref 22–32)
Calcium: 9 mg/dL (ref 8.9–10.3)
Chloride: 114 mmol/L — ABNORMAL HIGH (ref 98–111)
Creatinine, Ser: 1.82 mg/dL — ABNORMAL HIGH (ref 0.61–1.24)
GFR, Estimated: 40 mL/min — ABNORMAL LOW (ref >60)
Glucose, Bld: 89 mg/dL (ref 70–99)
Potassium: 4.8 mmol/L (ref 3.5–5.1)
Sodium: 146 mmol/L — ABNORMAL HIGH (ref 135–145)

## 2022-11-18 LAB — LACTATE DEHYDROGENASE: LDH: 261 U/L — ABNORMAL HIGH (ref 98–192)

## 2022-11-18 NOTE — Assessment & Plan Note (Signed)
He did not Acalabrutinib due to diarrhea. He prefers not to try reduced dose.  S/p Rituximab weekly x 4 Refractory disease to rituximab and rapid progression now with bulky lymphadenopathy.  I recommended repeat PET scan for further evaluation.  Rule out Richter's transformation.  Patient declines PET scan.  I recommend to repeat a biopsy of the intra-abdominal lymph node.  He agrees.  Recommend patient to stop aspirin 81 mg now until after biopsy. Consider switching to zanubrutinib or Venetoclax continue regimen.

## 2022-11-18 NOTE — Progress Notes (Signed)
Pt here for follow up. Pt reports that yesterday morning he threw up "big blobs of white mucus." Reports that he has been having abdominal distention and shortness of breath when walking.

## 2022-11-18 NOTE — Addendum Note (Signed)
Addended byArmando Reichert on: 11/18/2022 03:03 PM   Modules accepted: Orders

## 2022-11-18 NOTE — Assessment & Plan Note (Signed)
Secondary to CLL with bone marrow involvement Hemoglobin is worse.

## 2022-11-18 NOTE — Progress Notes (Signed)
Synopsis: Referred in for shortness of breath by Joseph Hawthorne, FNP  Assessment & Plan:   1. Shortness of breath 2. CLL 3. Beta-thalassemia   The patient is presenting for acute onset shortness of breath of 3 weeks in duration prior to which he reports having been in his usual state of health.  Workup so far in the emergency department has included a CT scan of his abdomen notable for bulky lymphadenopathy and blood work notable for anemia (chronic) and an AKI.  Review of his imaging is notable for a CT scan from May 2023 that shows mild centrilobular and paraseptal emphysema which would not explain the sudden decline in his breathing and sudden shortness of breath.  His lungs are clear on auscultation with no wheezing. POCUS of the lungs is notable for bilateral B-lines, and POCUS of the heart does not show a pericardial effusion or a grossly dysfunctional RV. The differential for his shortness of breath includes venous thromboembolism, enlarging mediastinal lymphadenopathy compressing on his airway or great vessels, cardiomyopathy (with possible pulmonary edema), and anemia.  I will obtain a CBC with differential, a basic metabolic panel, an echocardiogram, and a CT chest with PE protocol stat to rule out VTE.  The patient is scheduled to see his oncologist today and has scheduled an appointment with cardiology on 11 December.  He has been started on Lasix in the ER which he will continue and for which I will monitor his kidney function today. I will see him in follow up in 2 weeks to review the results of his studies and follow ups and decide on further testing (such as PFT).  - ECHOCARDIOGRAM COMPLETE; Future - CT Angio Chest Pulmonary Embolism (PE) W or WO Contrast; Future - CBC w/Diff; Future - Basic metabolic panel; Future   Return in about 2 weeks (around 12/02/2022).  I spent 60 minutes caring for this patient today, including preparing to see the patient, obtaining a medical  history , reviewing a separately obtained history, performing a medically appropriate examination and/or evaluation, counseling and educating the patient/family/caregiver, ordering medications, tests, or procedures, documenting clinical information in the electronic health record, and independently interpreting results (not separately reported/billed) and communicating results to the patient/family/caregiver  Joseph Reichert, MD [facility] 11/18/2022 12:30 PM    End of visit medications:  No orders of the defined types were placed in this encounter.    Current Outpatient Medications:    hydrocortisone cream 1 %, Apply 1 application. topically 2 (two) times daily., Disp: 30 g, Rfl: 0   montelukast (SINGULAIR) 10 MG tablet, Take 1 tablet (10 mg total) by mouth See admin instructions. Take 1 tablet on the day of next appointment, Disp: 1 tablet, Rfl: 0   pantoprazole (PROTONIX) 40 MG tablet, Take 1 tablet (40 mg total) by mouth daily. Prior to breakfast, 30 min to 1 hr, Disp: 30 tablet, Rfl: 3   rosuvastatin (CRESTOR) 10 MG tablet, Take 1 tablet (10 mg total) by mouth daily., Disp: 90 tablet, Rfl: 3   tamsulosin (FLOMAX) 0.4 MG CAPS capsule, Take 1 capsule (0.4 mg total) by mouth daily. Give 30, Disp: 30 capsule, Rfl: 1   traZODone (DESYREL) 50 MG tablet, Take 0.5-1 tablets (25-50 mg total) by mouth at bedtime as needed for sleep., Disp: 30 tablet, Rfl: 3   furosemide (LASIX) 20 MG tablet, Take 1 tablet (20 mg total) by mouth daily for 5 days., Disp: 5 tablet, Rfl: 0   potassium chloride SA (  KLOR-CON M) 20 MEQ tablet, Take 1 tablet (20 mEq total) by mouth daily for 5 days., Disp: 5 tablet, Rfl: 0   Subjective:   PATIENT ID: [name] GENDER: male DOB: [birth year], MRN: [id number]  Chief Complaint  Patient presents with   pulmonary consult    SOB with exertion, sneezing and prod cough with clear sputum.     HPI  [name] is a pleasant 71 year old male patient with a  history of CLL who presents to clinic for the evaluation of shortness of breath.  Patient feels that his symptoms were sudden in onset and started around 3 weeks ago.  He started having shortness of breath with exertion and at rest with notable lower extremity edema.  He feels that his symptoms were also associated with a cough that is productive of a whitish sputum.  This is also associated with increased abdominal girth.  He was seen in the emergency department on 11/09/2022 for his lower extremity edema where a CT scan of the abdomen was notable for increased lymphadenopathy consistent with progressive CLL, a diagnosis he carries.  He was given Lasix which she has been using daily with mild improvement. The lower extremity edema is significantly improved, however.  Otherwise, he has no fevers, no chills, no night sweats, and no rashes.  He does endorse feeling weak and fatigued which has also been progressive.  He was diagnosed with CLL in 2015 after initially presenting with hemolytic anemia, status posttreatment with steroids and rituximab.  He also has a history of but the thalassemia minor with chronic microcytic anemia.  He was last seen by [name] in oncology clinic in August and is scheduled to see her this afternoon.  He is also been seen by cardiology in May 2022 for bradycardia and peripheral arterial disease.  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  He reports very distant history of smoking cigarettes but quit many many years ago and currently only smokes cigars every now and then.  He denies any illicit drug use or other inhalational exposures.  Ancillary information including prior medications, full medical/surgical/family/social histories, and PFTs (when available) are listed below and have been reviewed.   Review of Systems  Constitutional:  Positive for malaise/fatigue and weight loss. Negative for chills and fever.   Respiratory:  Positive for cough, sputum production and shortness of breath. Negative for hemoptysis and wheezing.   Cardiovascular:  Positive for leg swelling and PND. Negative for chest pain.  Gastrointestinal:  Positive for heartburn. Negative for abdominal pain.       Abdominal fullness  Genitourinary:  Negative for dysuria.     Objective:   Vitals:   11/18/22 1150  BP: 130/70  Pulse: (!) 55  Temp: 97.8 F (36.6 C)  TempSrc: Temporal  SpO2: 100%  Weight: 144 lb (65.3 kg)  Height: '5\' 8"'$  (1.727 m)   100% on RA  BMI Readings from Last 3 Encounters:  11/18/22 21.90 kg/m  11/09/22 21.65 kg/m  11/09/22 22.30 kg/m   Wt Readings from Last 3 Encounters:  11/18/22 144 lb (65.3 kg)  11/09/22 142 lb 6.4 oz (64.6 kg)  11/09/22 142 lb 6.4 oz (64.6 kg)    Physical Exam Constitutional:      General: He is not in acute distress.    Appearance: Normal appearance. He is not ill-appearing.  HENT:     Mouth/Throat:     Mouth: Mucous membranes are  moist.  Eyes:     Pupils: Pupils are equal, round, and reactive to light.  Cardiovascular:     Rate and Rhythm: Regular rhythm. Bradycardia present.     Pulses: Normal pulses.     Heart sounds: Normal heart sounds.  Pulmonary:     Effort: Pulmonary effort is normal.     Breath sounds: Normal breath sounds. No wheezing or rales.  Abdominal:     General: There is distension.     Palpations: Abdomen is soft.  Musculoskeletal:        General: Normal range of motion.     Right lower leg: No edema.     Left lower leg: No edema.  Neurological:     General: No focal deficit present.     Mental Status: He is alert and oriented to person, place, and time. Mental status is at baseline.    POCUS Bedside point-of-care ultrasound of the heart notable for grossly normal RV size and function.  There was no pericardial effusion.  Lung ultrasound notable for bilateral B-lines.   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   [name]   Hyperlipidemia    Low back pain    Peripheral vascular disease ([place])      Family History  Problem Relation Age of Onset   Heart attack Father    Kidney cancer Neg Hx        lung cancer   Bladder Cancer Neg Hx    Prostate cancer Neg Hx      Past Surgical History:  Procedure Laterality Date   COLONOSCOPY     COLONOSCOPY WITH PROPOFOL N/A 07/23/2015   Procedure: COLONOSCOPY WITH PROPOFOL;  Surgeon: [name], MD;  Location: [facility] ENDOSCOPY;  Service: Endoscopy;  Laterality: N/A;   COLONOSCOPY WITH PROPOFOL N/A 04/20/2022   Procedure: COLONOSCOPY WITH PROPOFOL;  Surgeon: Jonathon Bellows, MD;  Location: [facility] ENDOSCOPY;  Service: Gastroenterology;  Laterality: N/A;   ESOPHAGOGASTRODUODENOSCOPY (EGD) WITH PROPOFOL N/A 08/20/2015   Procedure: ESOPHAGOGASTRODUODENOSCOPY (EGD) WITH PROPOFOL;  Surgeon: [name], MD;  Location: [facility] ENDOSCOPY;  Service: Endoscopy;  Laterality: N/A;   LOWER EXTREMITY ANGIOGRAPHY Left 05/19/2018   Procedure: LOWER EXTREMITY ANGIOGRAPHY;  Surgeon: Algernon Huxley, MD;  Location: [facility];  Service: Cardiovascular;  Laterality: Left;   LOWER EXTREMITY ANGIOGRAPHY Right 06/15/2018   Procedure: LOWER EXTREMITY ANGIOGRAPHY;  Surgeon: Algernon Huxley, MD;  Location: [facility];  Service: Cardiovascular;  Laterality: Right;   lymp node removal Right 2011   arm   left arm 2015   LYMPH NODE BIOPSY  08/2014    Social History   Socioeconomic History   Marital status: Married    Spouse name: Not on file   Number of children: Not on file   Years of education: Not on file   Highest education level: Not on file  Occupational History   Not on file  Tobacco Use   Smoking status: Former    Types: Cigars    Quit date: 07/2022    Years since quitting: 0.3   Smokeless tobacco: Never   Tobacco comments:    .5 ppd  last year, total of 48.5 pack year  Vaping Use   Vaping Use: Never used  Substance and Sexual Activity   Alcohol use: No   Drug use: No  Sexual activity: Yes  Other Topics Concern   Not on file  Social History Narrative   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    Let go from [facility] 11/2019 unexpectedly   Sons 2, daughters 4   10 grandchildren   Social Determinants of Health   Financial Resource Strain: Low Risk  (08/03/2022)   Overall Financial Resource Strain (CARDIA)    Difficulty of Paying Living Expenses: Not hard at all  Food Insecurity: No Food Insecurity (08/03/2022)   Hunger Vital Sign    Worried About Running Out of Food in the Last Year: Never true    Ran Out of Food in the Last Year: Never true  Transportation Needs: No Transportation Needs (08/03/2022)   PRAPARE - Hydrologist (Medical): No    Lack of Transportation (Non-Medical): No  Physical Activity: Sufficiently Active (08/03/2022)   Exercise Vital Sign    Days of Exercise per Week: 7 days    Minutes of Exercise per Session: 30 min  Stress: No Stress Concern Present (08/03/2022)   [place]    Feeling of Stress : Not at all  Social Connections: Unknown (08/03/2022)   Social Connection and Isolation Panel [NHANES]    Frequency of Communication with Friends and Family: Not on file    Frequency of Social Gatherings with Friends and Family: Not on file    Attends Religious Services: Not on file    Active Member of Clubs or Organizations: Not on file    Attends Archivist Meetings: Not on file    Marital Status: Married  Intimate Partner Violence: Not At Risk (08/03/2022)   Humiliation, Afraid, Rape, and Kick questionnaire    Fear of Current or Ex-Partner: No    Emotionally Abused: No    Physically Abused: No    Sexually Abused: No     Allergies  Allergen Reactions   Penicillins     Other  reaction(s): Rash, Hives ()   Latex Rash     CBC    Component Value Date/Time   WBC 40.0 (H) 11/09/2022 1429   RBC 3.55 (L) 11/09/2022 1429   HGB 9.1 (L) 11/09/2022 1429   HGB 13.0 03/13/2015 0852   HCT 29.3 (L) 11/09/2022 1429   HCT 40.9 03/13/2015 0852   PLT 152 11/09/2022 1429   PLT 217 03/13/2015 0852   MCV 82.5 11/09/2022 1429   MCV 78 (L) 03/13/2015 0852   MCH 25.6 (L) 11/09/2022 1429   MCHC 31.1 11/09/2022 1429   RDW 15.9 (H) 11/09/2022 1429   RDW 16.0 (H) 03/13/2015 0852   LYMPHSABS 28.6 (H) 11/09/2022 1429   LYMPHSABS 3.9 (H) 03/13/2015 0852   MONOABS 9.4 (H) 11/09/2022 1429   MONOABS 0.4 03/13/2015 0852   EOSABS 0.2 11/09/2022 1429   EOSABS 0.4 03/13/2015 0852   BASOSABS 0.2 (H) 11/09/2022 1429   BASOSABS 0.1 03/13/2015 2706    Pulmonary Functions Testing Results:     No data to display          Outpatient Medications Prior to Visit  Medication Sig Dispense Refill   hydrocortisone cream 1 % Apply 1 application. topically 2 (two) times daily. 30 g 0   montelukast (SINGULAIR) 10 MG tablet Take 1 tablet (10 mg total) by mouth See admin instructions. Take 1 tablet on the day of next appointment 1 tablet 0   pantoprazole (PROTONIX) 40 MG tablet Take 1 tablet (40  mg total) by mouth daily. Prior to breakfast, 30 min to 1 hr 30 tablet 3   rosuvastatin (CRESTOR) 10 MG tablet Take 1 tablet (10 mg total) by mouth daily. 90 tablet 3   tamsulosin (FLOMAX) 0.4 MG CAPS capsule Take 1 capsule (0.4 mg total) by mouth daily. Give 30 30 capsule 1   traZODone (DESYREL) 50 MG tablet Take 0.5-1 tablets (25-50 mg total) by mouth at bedtime as needed for sleep. 30 tablet 3   furosemide (LASIX) 20 MG tablet Take 1 tablet (20 mg total) by mouth daily for 5 days. 5 tablet 0   potassium chloride SA (KLOR-CON M) 20 MEQ tablet Take 1 tablet (20 mEq total) by mouth daily for 5 days. 5 tablet 0   No facility-administered medications prior to visit.

## 2022-11-18 NOTE — Telephone Encounter (Signed)
Seen by pulmonology today

## 2022-11-18 NOTE — Progress Notes (Signed)
Hematology/Oncology Progress note [phone] [phone]   ASSESSMENT & PLAN:   Chronic lymphocytic leukemia (CLL), B-cell ([place]) He did not Acalabrutinib due to diarrhea. He prefers not to try reduced dose.  S/p Rituximab weekly x 4 Refractory disease to rituximab and rapid progression now with bulky lymphadenopathy.  I recommended repeat PET scan for further evaluation.  Rule out Richter's transformation.  Patient declines PET scan.  I recommend to repeat a biopsy of the intra-abdominal lymph node.  He agrees.  Recommend patient to stop aspirin 81 mg now until after biopsy. Consider switching to zanubrutinib or Venetoclax continue regimen.    Noncompliance Patient has history of noncompliance of follow-up.  We emphasized importance of follow-up visits today.  Normocytic anemia Secondary to CLL with bone marrow involvement Hemoglobin is worse.  Tobacco abuse Recommend smoke cessation.   Follow-up  Per LOS  All questions were answered. The patient knows to call the clinic with any problems, questions or concerns.  Earlie Server, MD, PhD [facility] Hematology Oncology 11/18/2022    Chief Complaint: [name] is a 70 y.o. 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  HPI:  The patient was last seen in the medical onocology clinic on 12/25/2016. Patient reports feeling profound fatigue, lack of appetite, weight loss. He weight 128 pounds today, comparing to 137 pounds when he was here in January 2018.  He met with [name], registered dietitian on 01/04/2017. Patient used to follow up with Dr.[name].   Treatment history: He originally presented with hemolytic anemia in 01/2014.  He was treated with prednisone. With taper of prednisone, his hemolysis returned.I think it's only positives are Per Dr.[name]'s note, patient received 4 weekly cycles of Rituxan (10/02/2014- 10/23/2014).   07/23/2017 Decision  was made to start Ibrutinib as patient is very symptomatic (fatigue and weight loss, lack of appetite). Patient did not show up at the chemo education class. He was never started on Ibrutinib and his constitutional symptoms and is symptoms improved.  We agreed on not to proceed with treatments as it was not clear whether his symptoms are secondary to CLL or not. Patient was recommended for watchful waiting. 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   After visit on 06/03/2018, he lost follow-up  seek second opinion at [facility] and was evaluated by heme-onc Dr.Ellis.  Patient was recommended for watchful waiting.  Patient was seen by [facility] heme-onc in October 2020   Patient reestablish care on 05/07/2020 and lost follow-up again -reestablish care on 02/19/2022 and no-show to a follow-up appointment.  He has slowly progressive microcytic anemia.  Colonoscopy on 07/23/2015 revealed diverticulosis in the sigmoid colon.  EGD on 08/20/2015 was normal. Guaiac cards x 3 were negative in 06/2015.  His diet fluctuates with his appetite.  He denies any melana or hematochezia.  He did not  tolerate Aacalabrutinib 100 mg daily due to diarrhea. Recent hospitalization due to severe sepsis, UTI. He is completing his course of Levaquin today.  Symptomatic anemia, s/p 1 unit of PRBC transfusion.  07/11/22 CT abdomen pelvis w contrast showed Known significant bulky adenopathy throughout the abdomen/mesentery and retroperitoneum with mild to moderate overall improvement. Improved external iliac chain adenopathy.  07/29/2022-08/20/2022 patient underwent weekly rituximab 370m/m2 x 4.  INTERVAL HISTORY 71y.o. male with history of CLL presents for follow-up.  Patient no-show to his appointment on 10/05/2022.  11/09/2022, patient presented to emergency room for evaluation of shortness of breath.  He has noticed progressive worsening of abdominal swelling and distention.  Associated with shortness of breath.   Patient was started on diuretics.   11/09/2022 CT abdomen pelvis with contrast showed progressive bulky retroperitoneal and mesenteric lymphadenopathy.  Conglomerate nodes in the gastrohepatic ligament measuring up to 12.9 x 10.1 cm.  Conglomerate nodes surrounding the mesenteric vessels measuring up to 16.9 x 11.7cm. There is extrinsic compression of the IVC and the renal veins by the lymphadenopathy but no evidence of large vessel occlusion.  Patient was accompanied by his wife today. Past Medical History:  Diagnosis Date   Anemia 2008   Arthritis    Cervicalgia    Chronic lymphocytic leukemia (CLL), B-cell ([place]) 06/15/2015   Dr. Tasia Catchings   Hyperlipidemia    Low back pain    Peripheral vascular disease [facility])     Past Surgical History:  Procedure Laterality Date   COLONOSCOPY     COLONOSCOPY WITH PROPOFOL N/A 07/23/2015   Procedure: COLONOSCOPY WITH PROPOFOL;  Surgeon: [name], MD;  Location: [facility] ENDOSCOPY;  Service: Endoscopy;  Laterality: N/A;   COLONOSCOPY WITH PROPOFOL N/A 04/20/2022   Procedure: COLONOSCOPY WITH PROPOFOL;  Surgeon: Jonathon Bellows, MD;  Location: [facility] ENDOSCOPY;  Service: Gastroenterology;  Laterality: N/A;   ESOPHAGOGASTRODUODENOSCOPY (EGD) WITH PROPOFOL N/A 08/20/2015   Procedure: ESOPHAGOGASTRODUODENOSCOPY (EGD) WITH PROPOFOL;  Surgeon: [name], MD;  Location: [facility] ENDOSCOPY;  Service: Endoscopy;  Laterality: N/A;   LOWER EXTREMITY ANGIOGRAPHY Left 05/19/2018   Procedure: LOWER EXTREMITY ANGIOGRAPHY;  Surgeon: Algernon Huxley, MD;  Location: [place] CV LAB;  Service: Cardiovascular;  Laterality: Left;   LOWER EXTREMITY ANGIOGRAPHY Right 06/15/2018   Procedure: LOWER EXTREMITY ANGIOGRAPHY;  Surgeon: Algernon Huxley, MD;  Location: [place] CV LAB;  Service: Cardiovascular;  Laterality: Right;   lymp node removal Right 2011   arm   left arm 2015   LYMPH NODE BIOPSY  08/2014    Family History  Problem Relation Age of Onset   Heart attack Father     Kidney cancer Neg Hx        lung cancer   Bladder Cancer Neg Hx    Prostate cancer Neg Hx     Social History:  reports that he quit smoking about 3 months ago. His smoking use included cigars. He has never used smokeless tobacco. He reports that he does not drink alcohol and does not use drugs.  Allergies:  Allergies  Allergen Reactions   Penicillins     Other reaction(s): Rash, Hives ()   Latex Rash   Review of Systems:  Review of Systems  Constitutional:  Positive for malaise/fatigue and weight loss. Negative for chills and fever.  HENT:  Negative for ear discharge, ear pain, nosebleeds and sore throat.   Eyes:  Negative for photophobia, pain and redness.  Respiratory:  Negative for cough, hemoptysis, sputum production, shortness of breath and wheezing.   Cardiovascular:  Positive for leg swelling. Negative for chest pain and palpitations.  Gastrointestinal:  Positive for abdominal pain. Negative for blood in stool, constipation, diarrhea, heartburn, nausea and vomiting.       Abdominal distention  Genitourinary:  Negative for dysuria and frequency.       Swelling of groin area.  Musculoskeletal:  Negative for myalgias and neck pain.  Skin:  Negative for rash.  Neurological:  Negative for dizziness, tingling, tremors, weakness and headaches.  Endo/Heme/Allergies:  Does not bruise/bleed easily.  Psychiatric/Behavioral:  Negative for depression, hallucinations and suicidal ideas.  Current Outpatient Medications on File Prior to Visit  Medication Sig Dispense Refill   hydrocortisone cream 1 % Apply 1 application. topically 2 (two) times daily. 30 g 0   montelukast (SINGULAIR) 10 MG tablet Take 1 tablet (10 mg total) by mouth See admin instructions. Take 1 tablet on the day of next appointment 1 tablet 0   pantoprazole (PROTONIX) 40 MG tablet Take 1 tablet (40 mg total) by mouth daily. Prior to breakfast, 30 min to 1 hr 30 tablet 3   rosuvastatin (CRESTOR) 10 MG tablet Take 1  tablet (10 mg total) by mouth daily. 90 tablet 3   tamsulosin (FLOMAX) 0.4 MG CAPS capsule Take 1 capsule (0.4 mg total) by mouth daily. Give 30 30 capsule 1   traZODone (DESYREL) 50 MG tablet Take 0.5-1 tablets (25-50 mg total) by mouth at bedtime as needed for sleep. 30 tablet 3   furosemide (LASIX) 20 MG tablet Take 1 tablet (20 mg total) by mouth daily for 5 days. (Patient not taking: Reported on 11/18/2022) 5 tablet 0   potassium chloride SA (KLOR-CON M) 20 MEQ tablet Take 1 tablet (20 mEq total) by mouth daily for 5 days. (Patient not taking: Reported on 11/18/2022) 5 tablet 0   No current facility-administered medications on file prior to visit.    Physical Exam: Blood pressure 139/81, pulse (!) 51, temperature 97.8 F (36.6 C), resp. rate 18, weight 142 lb 9.6 oz (64.7 kg). Physical Exam Constitutional:      General: He is not in acute distress.    Appearance: He is not diaphoretic.  HENT:     Head: Normocephalic and atraumatic.     Nose: Nose normal.     Mouth/Throat:     Pharynx: No oropharyngeal exudate.  Eyes:     General: No scleral icterus.    Pupils: Pupils are equal, round, and reactive to light.  Cardiovascular:     Rate and Rhythm: Normal rate and regular rhythm.     Heart sounds: No murmur heard. Pulmonary:     Effort: Pulmonary effort is normal. No respiratory distress.     Breath sounds: No rales.  Chest:     Chest wall: No tenderness.  Abdominal:     General: There is distension.     Palpations: Abdomen is soft. There is mass.     Tenderness: There is no abdominal tenderness.  Genitourinary:    Comments: Previously palpable left inguinal lymph node, tender Not checked during this encounter. Musculoskeletal:        General: Normal range of motion.     Cervical back: Normal range of motion and neck supple.  Skin:    General: Skin is warm and dry.     Findings: No erythema.  Neurological:     Mental Status: He is alert and oriented to person, place,  and time.     Cranial Nerves: No cranial nerve deficit.     Motor: No abnormal muscle tone.     Coordination: Coordination normal.  Psychiatric:        Mood and Affect: Affect normal.     LABORATORY RESULTS.    Latest Ref Rng & Units 11/18/2022    1:02 PM 11/18/2022   12:45 PM 11/09/2022    2:29 PM  CBC  WBC 4.0 - 10.5 K/uL 60.8  64.2  40.0   Hemoglobin 13.0 - 17.0 g/dL 8.8  8.7  9.1   Hematocrit 39.0 - 52.0 % 28.7  29.3  29.3  Platelets 150 - 400 K/uL 144  139  152       Latest Ref Rng & Units 11/18/2022    1:02 PM 11/18/2022   12:45 PM 11/09/2022    2:29 PM  CMP  Glucose 70 - 99 mg/dL 90  89  119   BUN 8 - 23 mg/dL _0 Creatinine 0.61 - 1.24 mg/dL 1.81  1.82  1.51   Sodium 135 - 145 mmol/L 139  146  143   Potassium 3.5 - 5.1 mmol/L 4.5  4.8  4.1   Chloride 98 - 111 mmol/L 109  114  115   CO2 22 - 32 mmol/L _1 Calcium 8.9 - 10.3 mg/dL 9.0  9.0  9.1   Total Protein 6.5 - 8.1 g/dL 6.6   6.1   Total Bilirubin 0.3 - 1.2 mg/dL 1.0   1.2   Alkaline Phos 38 - 126 U/L 87   75   AST 15 - 41 U/L 25   27   ALT 0 - 44 U/L 14   13      PATHOLOGY:  11/02/2016 Peripheral blood FISH studies revealed 20% of nuclei positive for loss of 1 ATM signal, 63% of nuclei positive for trisomy 12, and 32% of nuclei positive for loss of 1 TP53 signal. Results for CCND1/IGH and 13q were normal.  09/13/2014 Left axillary node biopsy on  confirmed B-cell small lymphocytic lymphoma (B-CLL/SLL)   IMAGE STUDIES CT ABDOMEN PELVIS W CONTRAST  Result Date: 11/09/2022 CLINICAL DATA:  Abdominal pain, acute, nonlocalized. Shortness of breath with abdominal pain, bloating, nausea, vomiting and diarrhea. History of chronic lymphocytic leukemia. * Tracking Code: BO * EXAM: CT ABDOMEN AND PELVIS WITH CONTRAST TECHNIQUE: Multidetector CT imaging of the abdomen and pelvis was performed using the standard protocol following bolus administration of intravenous contrast. RADIATION DOSE  REDUCTION: This exam was performed according to the departmental dose-optimization program which includes automated exposure control, adjustment of the mA and/or kV according to patient size and/or use of iterative reconstruction technique. CONTRAST:  31m OMNIPAQUE IOHEXOL 300 MG/ML  SOLN COMPARISON:  Abdominopelvic CT 07/11/2022 and 05/08/2022. PET-CT 05/28/2022. FINDINGS: Lower chest: Clear lung bases. No significant pleural or pericardial effusion. Coronary artery atherosclerosis noted. Partially imaged right infrahilar lymph node measuring 9 mm on image 1/2. Increasing right retrocrural adenopathy, measuring up to 1.4 cm short axis on image 18/2. Hepatobiliary: The liver is normal in density without suspicious focal abnormality. No evidence of gallstones, gallbladder wall thickening or biliary dilatation. Pancreas: Anteriorly and superiorly displaced by progressive extensive retroperitoneal adenopathy. No pancreatic ductal dilatation or surrounding inflammation. Spleen: Normal in size without focal abnormality. Adrenals/Urinary Tract: Both adrenal glands appear normal. No evidence of urinary tract calculus, suspicious renal lesion or hydronephrosis. The bladder appears unremarkable for its degree of distention. Stomach/Bowel: No enteric contrast administered. The stomach is anteriorly and superiorly displaced by progressive retroperitoneal and mesenteric lymphadenopathy. No evidence of bowel wall thickening, distention or surrounding inflammation. The appendix appears normal. Vascular/Lymphatic: Bulky retroperitoneal and mesenteric lymphadenopathy has progressed compared with the most recent CT. Conglomerate nodes in the gastrohepatic ligament measure up to 12.9 x 10.1 cm on image 20/2 (previously approximately 7.3 cm maximally). At the level of the iliac crest, there is a conglomerate of nodes surrounding the mesenteric vessels which measures up to 16.9 x 11.7 cm on image 49/2. Small pelvic and inguinal lymph  nodes have mildly increased in size and number.  Diffuse aortic and branch vessel atherosclerosis without evidence of aneurysm. Right femoral artery stent appears chronically occluded. There is extrinsic compression of the IVC and renal veins by the lymphadenopathy, but no evidence of large vessel occlusion. Reproductive: Stable calcifications within the prostate gland. Other: Small amount of free pelvic fluid appears similar to previous study. Small umbilical hernia containing only fat. No free air. Musculoskeletal: No acute or significant osseous findings. Chronic degenerative disc disease at L5-S1 with associated discogenic sclerosis. IMPRESSION: 1. Progressive bulky retroperitoneal and mesenteric lymphadenopathy consistent with progressive lymphoma. 2. No evidence of bowel obstruction, perforation or abscess. 3. Stable small amount of free pelvic fluid. 4. Aortic Atherosclerosis (ICD10-I70.0). Chronically occluded right femoral arterial stent. Electronically Signed   By: Richardean Sale M.D.   On: 11/09/2022 18:58   US Abdomen Limited RUQ (LIVER/GB)  Result Date: 11/09/2022 CLINICAL DATA:  [id number] Right upper quadrant pain [id number] EXAM: ULTRASOUND ABDOMEN LIMITED RIGHT UPPER QUADRANT COMPARISON:  CT abdomen pelvis 07/11/2022 FINDINGS: Gallbladder: Calcified gallstones within the gallbladder lumen. Non mobile hyperechoic foci along the gallbladder wall measuring up to 6 mm. No gallbladder wall thickening or pericholecystic fluid visualized. No sonographic Murphy sign noted by sonographer. Common bile duct: Diameter: 4 mm Liver: No focal lesion identified. Within normal limits in parenchymal echogenicity. Portal vein is patent on color Doppler imaging with normal direction of blood flow towards the liver. Other: Heterogeneous perigastric mass poorly visualized. IMPRESSION: 1. Cholelithiasis with no of acute cholecystitis. 2. Non-mobile hyperechoic foci of 6 mm along the gallbladder wall that may represent a  gallbladder polyp. Recommend follow-up ultrasound in 6 months to evaluate stability in size. 3. Heterogeneous perigastric mass poorly visualized. Finding may correlate to finding of bulky adenopathy noted on CT abdomen pelvis 07/11/2022. Electronically Signed   By: Iven Finn M.D.   On: 11/09/2022 15:46   DG Chest 2 View  Result Date: 11/09/2022 CLINICAL DATA:  Two-week history of shortness of breath, abdominal pain, and bloating EXAM: CHEST - 2 VIEW COMPARISON:  Chest radiograph dated 07/12/2019 FINDINGS: Low lung volumes with bronchovascular crowding. No focal consolidations. No pleural effusion or pneumothorax. The heart size and mediastinal contours are within normal limits. The visualized skeletal structures are unremarkable. Right axillary surgical clips. IMPRESSION: Low lung volumes with bronchovascular crowding. No focal consolidations. Electronically Signed   By: Darrin Nipper M.D.   On: 11/09/2022 14:48

## 2022-11-18 NOTE — Assessment & Plan Note (Signed)
Patient has history of noncompliance of follow-up.  We emphasized importance of follow-up visits today.

## 2022-11-18 NOTE — Telephone Encounter (Signed)
Request for abdominal biopsy faxed to IR. Pt will see MD 1 week after biopsy for results

## 2022-11-18 NOTE — Progress Notes (Signed)
Please call the patient and ask them to stop taking their furosemide. I will order repeat blood work for Friday to follow up on kidney function.

## 2022-11-18 NOTE — Assessment & Plan Note (Signed)
Recommend smoke cessation.  

## 2022-11-18 NOTE — Telephone Encounter (Signed)
        Patient  visited [facility] on 11/09/2022  for SOB   Telephone encounter attempt :  1st Unable to leave message  . [place] [phone] [address] , [place] [address] Email : [email or web address]

## 2022-11-19 ENCOUNTER — Ambulatory Visit: Payer: Medicare HMO

## 2022-11-19 ENCOUNTER — Encounter: Payer: Self-pay | Admitting: Student in an Organized Health Care Education/Training Program

## 2022-11-19 ENCOUNTER — Telehealth: Payer: Self-pay | Admitting: *Deleted

## 2022-11-19 NOTE — Telephone Encounter (Signed)
     Patient  visit on 11/08/2022  at [facility] was for pain  Have you been able to follow up with your primary care physician? Has reached to PCP and is having more testing   The patient was able to obtain any needed medicine or equipment.  Are there diet recommendations that you are having difficulty following?  Patient expresses understanding of discharge instructions and education provided has no other needs at this time.    [place] [phone] [address] , [place] [address] Email : [email or web address]

## 2022-11-23 ENCOUNTER — Other Ambulatory Visit: Payer: Self-pay | Admitting: Internal Medicine

## 2022-11-23 DIAGNOSIS — C9110 Chronic lymphocytic leukemia of B-cell type not having achieved remission: Secondary | ICD-10-CM

## 2022-11-23 NOTE — Telephone Encounter (Signed)
Spoke to pt and he is agreeable with procedure date time.

## 2022-11-23 NOTE — H&P (Signed)
Chief Complaint: Intra abdominal mass. Abdominal Mass Biopsy. Possible paracentesis  Referring Physician(s): Yu,Zhou  Supervising Physician: Juliet Rude  Patient Status: [facility] - Out-pt  History of Present Illness: [name] is a 71 y.o. male outpatient. Former Smoker. History of CLL, HLD, anemia, PCD. Presented to the ED at [facility] with [facility],  abdominal pain,swelling and distention. CT Abd pelvis from 11.20.23 reads  Progressive bulky retroperitoneal and mesenteric lymphadenopathy consistent with progressive lymphoma.Team is requesting abdominal mass biopsy. Case approved by IR Attending Dr. Rolla Plate.  Patient endorses abdominal pain and diarrhea overnight.  Patient alert and laying in bed,calm. Denies any fevers, headache, chest pain, SOB, cough nausea, vomiting or bleeding. Return precautions and treatment recommendations and follow-up discussed with the patient  who is agreeable with the plan.   .  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 Tasia Catchings   Hyperlipidemia    Low back pain    Peripheral vascular disease [facility])     Past Surgical History:  Procedure Laterality Date   COLONOSCOPY     COLONOSCOPY WITH PROPOFOL N/A 07/23/2015   Procedure: COLONOSCOPY WITH PROPOFOL;  Surgeon: [name], MD;  Location: [facility] ENDOSCOPY;  Service: Endoscopy;  Laterality: N/A;   COLONOSCOPY WITH PROPOFOL N/A 04/20/2022   Procedure: COLONOSCOPY WITH PROPOFOL;  Surgeon: Jonathon Bellows, MD;  [facility];  Service: Gastroenterology;  Laterality: N/A;   ESOPHAGOGASTRODUODENOSCOPY (EGD) WITH PROPOFOL N/A 08/20/2015   Procedure: ESOPHAGOGASTRODUODENOSCOPY (EGD) WITH PROPOFOL;  Surgeon: [name], MD;  Location: [facility] ENDOSCOPY;  Service: Endoscopy;  Laterality: N/A;   LOWER EXTREMITY ANGIOGRAPHY Left 05/19/2018   Procedure: LOWER EXTREMITY ANGIOGRAPHY;  Surgeon: Algernon Huxley, MD;  Location: [place] CV LAB;  Service: Cardiovascular;  Laterality: Left;   LOWER EXTREMITY ANGIOGRAPHY Right 06/15/2018   Procedure: LOWER EXTREMITY ANGIOGRAPHY;  Surgeon: Algernon Huxley, MD;  Location: [place] CV LAB;  Service: Cardiovascular;  Laterality: Right;   lymp node removal Right 2011   arm   left arm 2015   LYMPH NODE BIOPSY  08/2014    Allergies: Penicillins and Latex  Medications: Prior to Admission medications   Medication Sig Start Date End Date Taking? Authorizing Provider  furosemide (LASIX) 20 MG tablet Take 1 tablet (20 mg total) by mouth daily for 5 days. Patient not taking: Reported on 11/18/2022 11/09/22 11/14/22  Duffy Bruce, MD  hydrocortisone cream 1 % Apply 1 application. topically 2 (two) times daily. 04/29/22   [name], FNP  montelukast (SINGULAIR) 10 MG tablet Take 1 tablet (10 mg total) by mouth See admin instructions. Take 1 tablet on the day of next appointment 11/09/22   [name], FNP  pantoprazole (PROTONIX) 40 MG tablet Take 1 tablet (40 mg total) by mouth daily. Prior to breakfast, 30 min to 1 hr 11/09/22   [name], FNP  potassium chloride SA (KLOR-CON M) 20 MEQ tablet Take 1 tablet (20 mEq total) by mouth daily for 5 days. Patient not taking: Reported on 11/18/2022 11/09/22 11/14/22  Duffy Bruce, MD  rosuvastatin (CRESTOR) 10 MG tablet Take 1 tablet (10 mg total) by mouth daily. 11/09/22   [name], FNP  tamsulosin (FLOMAX) 0.4 MG CAPS capsule Take 1 capsule (0.4 mg total) by mouth daily. Give 30 07/22/22   [name], FNP  traZODone (DESYREL) 50 MG tablet Take  0.5-1 tablets (25-50 mg total) by mouth at bedtime as needed for sleep. 11/09/22   [name], FNP     Family History  Problem Relation Age of Onset   Heart attack Father    Kidney cancer Neg Hx        lung cancer   Bladder Cancer Neg Hx    Prostate cancer Neg Hx     Social History   Socioeconomic History    Marital status: Married    Spouse name: Not on file   Number of children: Not on file   Years of education: Not on file   Highest education level: Not on file  Occupational History   Not on file  Tobacco Use   Smoking status: Former    Types: Cigars    Quit date: 07/2022    Years since quitting: 0.3   Smokeless tobacco: Never   Tobacco comments:    .5 ppd last year, total of 48.5 pack year  Vaping Use   Vaping Use: Never used  Substance and Sexual Activity   Alcohol use: No   Drug use: No   Sexual activity: Yes  Other Topics Concern   Not on file  Social History Narrative   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    Let go from [facility] 11/2019 unexpectedly   Sons 2, daughters 4   10 grandchildren   Social Determinants of Health   Financial Resource Strain: Low Risk  (08/03/2022)   Overall Financial Resource Strain (CARDIA)    Difficulty of Paying Living Expenses: Not hard at all  Food Insecurity: No Food Insecurity (08/03/2022)   Hunger Vital Sign    Worried About Running Out of Food in the Last Year: Never true    Ran Out of Food in the Last Year: Never true  Transportation Needs: No Transportation Needs (08/03/2022)   PRAPARE - Hydrologist (Medical): No    Lack of Transportation (Non-Medical): No  Physical Activity: Sufficiently Active (08/03/2022)   Exercise Vital Sign    Days of Exercise per Week: 7 days    Minutes of Exercise per Session: 30 min  Stress: No Stress Concern Present (08/03/2022)   [place]    Feeling of Stress : Not at all  Social Connections: Unknown (08/03/2022)   Social Connection and Isolation Panel [NHANES]    Frequency of Communication with Friends and Family: Not on file    Frequency of Social Gatherings with Friends and Family: Not on file    Attends Religious Services: Not on file    Active Member of Clubs or Organizations: Not on file     Attends Archivist Meetings: Not on file    Marital Status: Married     Review of Systems: A 12 point ROS discussed and pertinent positives are indicated in the HPI above.  All other systems are negative.  Review of Systems  Constitutional:  Negative for fever.  HENT:  Negative for congestion.   Respiratory:  Negative for cough and shortness of breath.   Cardiovascular:  Negative for chest pain.  Gastrointestinal:  Positive for abdominal pain and diarrhea.  Neurological:  Negative for headaches.  Psychiatric/Behavioral:  Negative for behavioral problems and confusion.     Vital Signs: BP (!) 148/85   Pulse (!) 55   Temp 97.8 F (36.6 C)   Resp 18   Ht '5\' 8"'$  (1.727 m)  Wt 142 lb (64.4 kg)   SpO2 98%   BMI 21.59 kg/m    Physical Exam Vitals and nursing note reviewed.  Constitutional:      Appearance: He is well-developed.  HENT:     Head: Normocephalic.  Cardiovascular:     Rate and Rhythm: Regular rhythm. Bradycardia present.     Heart sounds: Normal heart sounds.  Pulmonary:     Effort: Pulmonary effort is normal.     Breath sounds: Normal breath sounds.  Abdominal:     General: There is distension.     Comments: Hyperactive bowel sounds  Musculoskeletal:        General: Normal range of motion.     Cervical back: Normal range of motion.  Skin:    General: Skin is dry.  Neurological:     General: No focal deficit present.     Mental Status: He is alert and oriented to person, place, and time.  Psychiatric:        Mood and Affect: Mood normal.        Behavior: Behavior normal.        Thought Content: Thought content normal.        Judgment: Judgment normal.     Imaging: CT ABDOMEN PELVIS W CONTRAST  Result Date: 11/09/2022 CLINICAL DATA:  Abdominal pain, acute, nonlocalized. Shortness of breath with abdominal pain, bloating, nausea, vomiting and diarrhea. History of chronic lymphocytic leukemia. * Tracking Code: BO * EXAM: CT ABDOMEN AND  PELVIS WITH CONTRAST TECHNIQUE: Multidetector CT imaging of the abdomen and pelvis was performed using the standard protocol following bolus administration of intravenous contrast. RADIATION DOSE REDUCTION: This exam was performed according to the departmental dose-optimization program which includes automated exposure control, adjustment of the mA and/or kV according to patient size and/or use of iterative reconstruction technique. CONTRAST:  78m OMNIPAQUE IOHEXOL 300 MG/ML  SOLN COMPARISON:  Abdominopelvic CT 07/11/2022 and 05/08/2022. PET-CT 05/28/2022. FINDINGS: Lower chest: Clear lung bases. No significant pleural or pericardial effusion. Coronary artery atherosclerosis noted. Partially imaged right infrahilar lymph node measuring 9 mm on image 1/2. Increasing right retrocrural adenopathy, measuring up to 1.4 cm short axis on image 18/2. Hepatobiliary: The liver is normal in density without suspicious focal abnormality. No evidence of gallstones, gallbladder wall thickening or biliary dilatation. Pancreas: Anteriorly and superiorly displaced by progressive extensive retroperitoneal adenopathy. No pancreatic ductal dilatation or surrounding inflammation. Spleen: Normal in size without focal abnormality. Adrenals/Urinary Tract: Both adrenal glands appear normal. No evidence of urinary tract calculus, suspicious renal lesion or hydronephrosis. The bladder appears unremarkable for its degree of distention. Stomach/Bowel: No enteric contrast administered. The stomach is anteriorly and superiorly displaced by progressive retroperitoneal and mesenteric lymphadenopathy. No evidence of bowel wall thickening, distention or surrounding inflammation. The appendix appears normal. Vascular/Lymphatic: Bulky retroperitoneal and mesenteric lymphadenopathy has progressed compared with the most recent CT. Conglomerate nodes in the gastrohepatic ligament measure up to 12.9 x 10.1 cm on image 20/2 (previously approximately 7.3 cm  maximally). At the level of the iliac crest, there is a conglomerate of nodes surrounding the mesenteric vessels which measures up to 16.9 x 11.7 cm on image 49/2. Small pelvic and inguinal lymph nodes have mildly increased in size and number. Diffuse aortic and branch vessel atherosclerosis without evidence of aneurysm. Right femoral artery stent appears chronically occluded. There is extrinsic compression of the IVC and renal veins by the lymphadenopathy, but no evidence of large vessel occlusion. Reproductive: Stable calcifications within the prostate gland. Other:  Small amount of free pelvic fluid appears similar to previous study. Small umbilical hernia containing only fat. No free air. Musculoskeletal: No acute or significant osseous findings. Chronic degenerative disc disease at L5-S1 with associated discogenic sclerosis. IMPRESSION: 1. Progressive bulky retroperitoneal and mesenteric lymphadenopathy consistent with progressive lymphoma. 2. No evidence of bowel obstruction, perforation or abscess. 3. Stable small amount of free pelvic fluid. 4. Aortic Atherosclerosis (ICD10-I70.0). Chronically occluded right femoral arterial stent. Electronically Signed   By: Richardean Sale M.D.   On: 11/09/2022 18:58   US Abdomen Limited RUQ (LIVER/GB)  Result Date: 11/09/2022 CLINICAL DATA:  [id number] Right upper quadrant pain [id number] EXAM: ULTRASOUND ABDOMEN LIMITED RIGHT UPPER QUADRANT COMPARISON:  CT abdomen pelvis 07/11/2022 FINDINGS: Gallbladder: Calcified gallstones within the gallbladder lumen. Non mobile hyperechoic foci along the gallbladder wall measuring up to 6 mm. No gallbladder wall thickening or pericholecystic fluid visualized. No sonographic Murphy sign noted by sonographer. Common bile duct: Diameter: 4 mm Liver: No focal lesion identified. Within normal limits in parenchymal echogenicity. Portal vein is patent on color Doppler imaging with normal direction of blood flow towards the liver. Other:  Heterogeneous perigastric mass poorly visualized. IMPRESSION: 1. Cholelithiasis with no of acute cholecystitis. 2. Non-mobile hyperechoic foci of 6 mm along the gallbladder wall that may represent a gallbladder polyp. Recommend follow-up ultrasound in 6 months to evaluate stability in size. 3. Heterogeneous perigastric mass poorly visualized. Finding may correlate to finding of bulky adenopathy noted on CT abdomen pelvis 07/11/2022. Electronically Signed   By: Iven Finn M.D.   On: 11/09/2022 15:46   DG Chest 2 View  Result Date: 11/09/2022 CLINICAL DATA:  Two-week history of shortness of breath, abdominal pain, and bloating EXAM: CHEST - 2 VIEW COMPARISON:  Chest radiograph dated 07/12/2019 FINDINGS: Low lung volumes with bronchovascular crowding. No focal consolidations. No pleural effusion or pneumothorax. The heart size and mediastinal contours are within normal limits. The visualized skeletal structures are unremarkable. Right axillary surgical clips. IMPRESSION: Low lung volumes with bronchovascular crowding. No focal consolidations. Electronically Signed   By: Darrin Nipper M.D.   On: 11/09/2022 14:48    Labs:  CBC: Recent Labs    08/20/22 0806 11/09/22 1429 11/18/22 1245 11/18/22 1302  WBC 6.9 40.0* 64.2* 60.8*  HGB 10.5* 9.1* 8.7* 8.8*  HCT 33.6* 29.3* 29.3* 28.7*  PLT 210 152 139* 144*    COAGS: No results for input(s): "INR", "APTT" in the last 8760 hours.  BMP: Recent Labs    08/20/22 0806 11/09/22 1429 11/18/22 1245 11/18/22 1302  NA 137 143 146* 139  K 4.2 4.1 4.8 4.5  CL 104 115* 114* 109  CO2 '26 23 25 22  '$ GLUCOSE 96 119* 89 90  BUN '17 21 21 22  '$ CALCIUM 9.0 9.1 9.0 9.0  CREATININE 1.33* 1.51* 1.82* 1.81*  GFRNONAA 58* 50* 40* 40*    LIVER FUNCTION TESTS: Recent Labs    08/13/22 0826 08/20/22 0806 11/09/22 1429 11/18/22 1302  BILITOT 1.0 0.7 1.2 1.0  AST '17 17 27 25  '$ ALT '10 12 13 14  '$ ALKPHOS 54 53 75 87  PROT 6.9 6.7 6.1* 6.6  ALBUMIN 4.0 3.9 3.8  4.1     Assessment and Plan:  71 y.o. male outpatient. Former smoker. History of CLL, HLD, anemia, PCD. Presented to the ED at [facility] with [facility],  abdominal pain,swelling and distention. CT Abd pelvis from 11.20.23 reads  Progressive bulky retroperitoneal and mesenteric lymphadenopathy consistent with progressive lymphoma.Team is requesting abdominal mass  biopsy. Case approved by IR Attending Dr. Rolla Plate.  Labs from 11.29.23 shows WBC 6.8, GFR < 40, BNP 132, Cr 1.81. Patient is on ASA. Last dose on 11.24.23. Allergies include PCN and latex. Patient has been NPO since midnight..   Risks and benefits of abdominal mass biopsy. was discussed with the patient and/or patient's family including, but not limited to bleeding, infection, damage to adjacent structures or low yield requiring additional tests.  All of the questions were answered and there is agreement to proceed.  Consent signed and in chart.   Thank you for this interesting consult.  I greatly enjoyed meeting [name] and look forward to participating in their care.  A copy of this report was sent to the requesting provider on this date.  Electronically Signed: Jacqualine Mau, NP 11/24/2022, 12:33 PM   I spent a total of  30 Minutes   in face to face in clinical consultation, greater than 50% of which was counseling/coordinating care for intra abdominal mass biopsy

## 2022-11-23 NOTE — Progress Notes (Signed)
Patient for CT guided Abd Mass Biopsy on Tues 11/24/2022, I called and spoke with the patient on the phone and gave pre-procedure instructions. Pt was made aware to be here at 11:30a at the new entrance, last dose of Aspirin '81mg'$  was Wed 11/18/2022 per pt and Dr Earlie Server, MD, NPO after MN prior to procedure as well as driver post procedure/recovery/discharge. Pt stated understanding.  Called 11/23/2022

## 2022-11-24 ENCOUNTER — Ambulatory Visit
Admission: RE | Admit: 2022-11-24 | Discharge: 2022-11-24 | Disposition: A | Discharge status: Home / Self Care | Payer: Medicare HMO | Source: Ambulatory Visit | Attending: Oncology | Admitting: Oncology

## 2022-11-24 DIAGNOSIS — R0602 Shortness of breath: Secondary | ICD-10-CM | POA: Insufficient documentation

## 2022-11-24 DIAGNOSIS — R1900 Intra-abdominal and pelvic swelling, mass and lump, unspecified site: Secondary | ICD-10-CM | POA: Insufficient documentation

## 2022-11-24 DIAGNOSIS — E785 Hyperlipidemia, unspecified: Secondary | ICD-10-CM | POA: Insufficient documentation

## 2022-11-24 DIAGNOSIS — Z79899 Other long term (current) drug therapy: Secondary | ICD-10-CM | POA: Diagnosis not present

## 2022-11-24 DIAGNOSIS — Z87891 Personal history of nicotine dependence: Secondary | ICD-10-CM | POA: Diagnosis not present

## 2022-11-24 DIAGNOSIS — D649 Anemia, unspecified: Secondary | ICD-10-CM | POA: Insufficient documentation

## 2022-11-24 DIAGNOSIS — R109 Unspecified abdominal pain: Secondary | ICD-10-CM | POA: Insufficient documentation

## 2022-11-24 DIAGNOSIS — R591 Generalized enlarged lymph nodes: Secondary | ICD-10-CM | POA: Insufficient documentation

## 2022-11-24 DIAGNOSIS — C9110 Chronic lymphocytic leukemia of B-cell type not having achieved remission: Secondary | ICD-10-CM | POA: Insufficient documentation

## 2022-11-24 MED ORDER — MIDAZOLAM HCL 2 MG/2ML IJ SOLN
INTRAMUSCULAR | Status: AC
  Filled 2022-11-24: qty 2

## 2022-11-24 MED ORDER — FENTANYL CITRATE (PF) 100 MCG/2ML IJ SOLN
INTRAMUSCULAR | Status: AC
  Filled 2022-11-24: qty 2

## 2022-11-24 MED ORDER — SODIUM CHLORIDE 0.9 % IV SOLN: INTRAVENOUS | Status: DC

## 2022-11-24 MED ORDER — MIDAZOLAM HCL 5 MG/5ML IJ SOLN
INTRAMUSCULAR | Status: AC | PRN
  Administered 2022-11-24: 1 mg via INTRAVENOUS

## 2022-11-24 MED ORDER — FENTANYL CITRATE (PF) 100 MCG/2ML IJ SOLN
INTRAMUSCULAR | Status: AC | PRN
  Administered 2022-11-24: 50 ug via INTRAVENOUS

## 2022-11-24 MED ORDER — LIDOCAINE HCL (PF) 1 % IJ SOLN: 10.0000 mL | Freq: Once | INTRAMUSCULAR | Status: DC

## 2022-11-24 NOTE — Procedures (Signed)
Interventional Radiology Procedure Note  Date of Procedure: 11/24/2022  Procedure: CT abdominal mass biopsy   Findings:  1. CT abdominal mass biopsy 18ga x6 cores    Complications: No immediate complications noted.   Estimated Blood Loss: minimal  Follow-up and Recommendations: 1. Bedrest 1 hour    Albin Felling, MD  Vascular & Interventional Radiology  11/24/2022 1:30 PM

## 2022-11-24 NOTE — Progress Notes (Signed)
Patient remains clinically stable, denies complaints, discharge instructions given to patient and wife, questions answered, vitals stable.

## 2022-11-25 ENCOUNTER — Encounter: Payer: Self-pay | Admitting: Oncology

## 2022-11-25 LAB — SURGICAL PATHOLOGY

## 2022-11-27 NOTE — Progress Notes (Unsigned)
Cardiology Office Note  Date:  11/27/2022   ID:  [name], DOB 03-Dec-1951, MRN [id number]  PCP:  Joseph Hawthorne, FNP   No chief complaint on file.   HPI:  [name] is a 71 year old gentleman with past medical history of CLL Hyperlipidemia PAD Smoker, former, no cigs in 30 years, now cigars Presenting by referral from [name] for consultation of his bradycardia, dizziness, shortness of breath, chest tightness  Last seen by myself in clinic May 2022  EKG in the PMD office heart rate 42 bpm EKG not available for review Notes indicating a History of longstanding bradycardia some heart rates into the low to mid 40s Currently not on any rate controlling agents  In follow-up today reports having significant peripheral arterial disease, stents to both legs bilaterally as detailed below  Feels he is having worsening SOB, low energy, some chest tightness Has to stop from time to time when working in the backyard to catch his breath, secondary to chest pain and energy  CT chest: screening study images pulled up and reviewed 2 vessel CAD, mild to moderate calcification  Peripheral disease history June 2019 = left PTA of SFA to Pop and stent  May 2019 = Right PTA of SFA to pop with stent in SFA and stent in Pop.   No claudication sx recently  Reports currently in the process of signing up for Medicare  EKG personally reviewed by myself on todays visit Sinus bradycardia rate 52 bpm T wave abnormality anterolateral leads   PMH:   has a past medical history of Anemia (2008), Arthritis, Cervicalgia, Chronic lymphocytic leukemia (CLL), B-cell ([place]) (06/15/2015), Hyperlipidemia, Low back pain, and Peripheral vascular disease ([place]).  PSH:    Past Surgical History:  Procedure Laterality Date   COLONOSCOPY     COLONOSCOPY WITH PROPOFOL N/A 07/23/2015   Procedure: COLONOSCOPY WITH PROPOFOL;  Surgeon: [name], MD;  Location: [facility] ENDOSCOPY;  Service:  Endoscopy;  Laterality: N/A;   COLONOSCOPY WITH PROPOFOL N/A 04/20/2022   Procedure: COLONOSCOPY WITH PROPOFOL;  Surgeon: Jonathon Bellows, MD;  Location: [facility] ENDOSCOPY;  Service: Gastroenterology;  Laterality: N/A;   ESOPHAGOGASTRODUODENOSCOPY (EGD) WITH PROPOFOL N/A 08/20/2015   Procedure: ESOPHAGOGASTRODUODENOSCOPY (EGD) WITH PROPOFOL;  Surgeon: [name], MD;  Location: [facility] ENDOSCOPY;  Service: Endoscopy;  Laterality: N/A;   LOWER EXTREMITY ANGIOGRAPHY Left 05/19/2018   Procedure: LOWER EXTREMITY ANGIOGRAPHY;  Surgeon: Algernon Huxley, MD;  Location: [place] CV LAB;  Service: Cardiovascular;  Laterality: Left;   LOWER EXTREMITY ANGIOGRAPHY Right 06/15/2018   Procedure: LOWER EXTREMITY ANGIOGRAPHY;  Surgeon: Algernon Huxley, MD;  Location: [place] CV LAB;  Service: Cardiovascular;  Laterality: Right;   lymp node removal Right 2011   arm   left arm 2015   LYMPH NODE BIOPSY  08/2014    Current Outpatient Medications  Medication Sig Dispense Refill   furosemide (LASIX) 20 MG tablet Take 1 tablet (20 mg total) by mouth daily for 5 days. (Patient not taking: Reported on 11/18/2022) 5 tablet 0   hydrocortisone cream 1 % Apply 1 application. topically 2 (two) times daily. 30 g 0   montelukast (SINGULAIR) 10 MG tablet Take 1 tablet (10 mg total) by mouth See admin instructions. Take 1 tablet on the day of next appointment 1 tablet 0   pantoprazole (PROTONIX) 40 MG tablet Take 1 tablet (40 mg total) by mouth daily. Prior to breakfast, 30 min to 1 hr 30 tablet 3   potassium chloride SA (  KLOR-CON M) 20 MEQ tablet Take 1 tablet (20 mEq total) by mouth daily for 5 days. (Patient not taking: Reported on 11/18/2022) 5 tablet 0   rosuvastatin (CRESTOR) 10 MG tablet Take 1 tablet (10 mg total) by mouth daily. 90 tablet 3   tamsulosin (FLOMAX) 0.4 MG CAPS capsule Take 1 capsule (0.4 mg total) by mouth daily. Give 30 30 capsule 1   traZODone (DESYREL) 50 MG tablet Take 0.5-1 tablets (25-50 mg total) by  mouth at bedtime as needed for sleep. 30 tablet 3   No current facility-administered medications for this visit.     Allergies:   Penicillins and Latex   Social History:  The patient  reports that he quit smoking about 4 months ago. His smoking use included cigars. He has never used smokeless tobacco. He reports that he does not drink alcohol and does not use drugs.   Family History:   family history includes Heart attack in his father.    Review of Systems: Review of Systems  Constitutional: Negative.   HENT: Negative.    Respiratory: Negative.    Cardiovascular: Negative.   Gastrointestinal: Negative.   Musculoskeletal: Negative.   Neurological: Negative.   Psychiatric/Behavioral: Negative.    All other systems reviewed and are negative.   PHYSICAL EXAM: VS:  There were no vitals taken for this visit. , BMI There is no height or weight on file to calculate BMI. GEN: Well nourished, well developed, in no acute distress HEENT: normal Neck: no JVD, carotid bruits, or masses Cardiac: RRR; no murmurs, rubs, or gallops,no edema  Respiratory:  clear to auscultation bilaterally, normal work of breathing GI: soft, nontender, nondistended, + BS MS: no deformity or atrophy Skin: warm and dry, no rash Neuro:  Strength and sensation are intact Psych: euthymic mood, full affect   Recent Labs: 06/15/2022: TSH 2.323 07/12/2022: Magnesium 2.4 11/09/2022: B Natriuretic Peptide 132.0 11/18/2022: ALT 14; BUN 22; Creatinine, Ser 1.81; Hemoglobin 8.8; Platelets 144; Potassium 4.5; Sodium 139    Lipid Panel Lab Results  Component Value Date   CHOL 165 04/30/2021   HDL 38.90 (L) 04/30/2021   LDLCALC 109 (H) 04/30/2021   TRIG 88.0 04/30/2021      Wt Readings from Last 3 Encounters:  11/24/22 142 lb (64.4 kg)  11/18/22 142 lb 9.6 oz (64.7 kg)  11/18/22 144 lb (65.3 kg)      ASSESSMENT AND PLAN:  Problem List Items Addressed This Visit   None Chest pain/angina Coronary  disease noted on CT scan chest Known peripheral arterial disease with stenting to legs bilaterally Reports progressive shortness of breath on exertion, Discussed potential options for ischemic work-up including cardiac CTA versus stress testing -Order placed for cardiac CTA to be done once he has signed up for his Medicare which he reports will be in place next week -Smoking cessation recommended  Smoker We have encouraged her to continue to work on weaning her cigarettes and smoking cessation. She will continue to work on this and does not want any assistance with chantix.   Hyperlipidemia Continue statin, may need to add Zetia 10 mg daily achieve goal LDL less than 10  PAD Followed by vein and vascular in [place], Smoking cessation recommended, goal LDL less than 70 Denies claudication symptoms  Bradycardia Unrelated to above symptoms, relatively asymptomatic, blood pressure stable No indication for pacemaker Would allow liberal blood pressure    Total encounter time more than 45 minutes  Greater than 50% was spent in counseling and  coordination of care with the patient    Signed, Esmond Plants, M.D., Ph.D. [place], [place]

## 2022-11-30 ENCOUNTER — Ambulatory Visit: Admission: RE | Admit: 2022-11-30 | Payer: Medicare HMO | Source: Ambulatory Visit

## 2022-11-30 ENCOUNTER — Ambulatory Visit: Payer: Medicare HMO | Attending: Cardiovascular Disease | Admitting: Cardiovascular Disease

## 2022-11-30 ENCOUNTER — Encounter: Payer: Self-pay | Admitting: Cardiovascular Disease

## 2022-11-30 ENCOUNTER — Telehealth: Payer: Self-pay

## 2022-11-30 VITALS — BP 112/62 | HR 57 | Ht 68.0 in | Wt 137.4 lb

## 2022-11-30 DIAGNOSIS — R0602 Shortness of breath: Secondary | ICD-10-CM | POA: Diagnosis not present

## 2022-11-30 DIAGNOSIS — J439 Emphysema, unspecified: Secondary | ICD-10-CM | POA: Diagnosis not present

## 2022-11-30 DIAGNOSIS — R42 Dizziness and giddiness: Secondary | ICD-10-CM | POA: Diagnosis not present

## 2022-11-30 DIAGNOSIS — I209 Angina pectoris, unspecified: Secondary | ICD-10-CM | POA: Diagnosis not present

## 2022-11-30 DIAGNOSIS — I739 Peripheral vascular disease, unspecified: Secondary | ICD-10-CM

## 2022-11-30 DIAGNOSIS — R001 Bradycardia, unspecified: Secondary | ICD-10-CM

## 2022-11-30 DIAGNOSIS — I2510 Atherosclerotic heart disease of native coronary artery without angina pectoris: Secondary | ICD-10-CM | POA: Diagnosis not present

## 2022-11-30 NOTE — Telephone Encounter (Signed)
[name] can you please schedule and notify patient of date and time.

## 2022-11-30 NOTE — Telephone Encounter (Signed)
-----   Message from Earlie Server, MD sent at 11/29/2022 10:01 PM EST ----- Please arrange him to see me asap to discuss biopsy results. Thanks. MD only

## 2022-11-30 NOTE — Patient Instructions (Addendum)
Medication Instructions:  No changes  If you need a refill on your cardiac medications before your next appointment, please call your pharmacy.   Lab work: No new labs needed  Testing/Procedures: No new testing needed  Follow-Up: At [facility], you and your health needs are our priority.  As part of our continuing mission to provide you with exceptional heart care, we have created designated Provider Care Teams.  These Care Teams include your primary Cardiologist (physician) and Advanced Practice Providers (APPs -  Physician Assistants and Nurse Practitioners) who all work together to provide you with the care you need, when you need it.  You will need a follow up appointment in 12 months  Providers on your designated Care Team:   Christopher Berge, NP Ryan Dunn, PA-C Cadence Furth, PA-C  COVID-19 Vaccine Information can be found at: [email or web address] For questions related to vaccine distribution or appointments, please email [email or web address] or call [phone].   

## 2022-11-30 NOTE — Progress Notes (Signed)
Phone note created 

## 2022-12-02 ENCOUNTER — Encounter: Payer: Self-pay | Admitting: Student in an Organized Health Care Education/Training Program

## 2022-12-02 ENCOUNTER — Ambulatory Visit (INDEPENDENT_AMBULATORY_CARE_PROVIDER_SITE_OTHER): Payer: Medicare HMO | Admitting: Student in an Organized Health Care Education/Training Program

## 2022-12-02 ENCOUNTER — Inpatient Hospital Stay: Payer: Medicare HMO | Attending: Oncology | Admitting: Oncology

## 2022-12-02 ENCOUNTER — Encounter: Payer: Self-pay | Admitting: Oncology

## 2022-12-02 VITALS — BP 124/78 | HR 61 | Temp 97.5°F | Ht 68.0 in | Wt 138.2 lb

## 2022-12-02 VITALS — BP 129/81 | HR 60 | Temp 96.3°F | Resp 18 | Wt 138.1 lb

## 2022-12-02 DIAGNOSIS — R0602 Shortness of breath: Secondary | ICD-10-CM

## 2022-12-02 DIAGNOSIS — C9110 Chronic lymphocytic leukemia of B-cell type not having achieved remission: Secondary | ICD-10-CM

## 2022-12-02 DIAGNOSIS — R197 Diarrhea, unspecified: Secondary | ICD-10-CM | POA: Diagnosis not present

## 2022-12-02 DIAGNOSIS — Z720 Tobacco use: Secondary | ICD-10-CM | POA: Diagnosis not present

## 2022-12-02 DIAGNOSIS — J432 Centrilobular emphysema: Secondary | ICD-10-CM | POA: Diagnosis not present

## 2022-12-02 DIAGNOSIS — D649 Anemia, unspecified: Secondary | ICD-10-CM | POA: Diagnosis not present

## 2022-12-02 DIAGNOSIS — Z5111 Encounter for antineoplastic chemotherapy: Secondary | ICD-10-CM

## 2022-12-02 MED ORDER — ZANUBRUTINIB 80 MG PO CAPS: 160.0000 mg | ORAL_CAPSULE | Freq: Two times a day (BID) | ORAL | 0 refills | Status: DC

## 2022-12-02 MED ORDER — TRAMADOL HCL 50 MG PO TABS: 50.0000 mg | ORAL_TABLET | Freq: Four times a day (QID) | ORAL | 0 refills | Status: DC | PRN

## 2022-12-02 NOTE — Progress Notes (Signed)
Pt reports feeling a lot pressure to abdominal area and that he has only had small bowel movements.

## 2022-12-02 NOTE — Assessment & Plan Note (Signed)
CLL treatment as listed above

## 2022-12-02 NOTE — Progress Notes (Signed)
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 He prefers not to try reduced dose.  S/p Rituximab weekly x 4, CT shows disease progression. Refractory disease rapid progression now with bulky lymphadenopathy.   Patient declines PET scan. Abdominal mass biopsy confirmed CLL, no evidence of transformation Discussed about treatment option of Zanubrutinib 160 mg twice daily.  Rationale and potential side effects were reviewed and discussed with patient.  He agrees with the plan.  Will check insurance coverage.  Normocytic anemia Secondary to CLL with bone marrow involvement Hemoglobin is worse.  Encounter for antineoplastic chemotherapy CLL treatment as listed above  Tobacco abuse Recommend smoke cessation.    Follow-up  Per LOS  All questions were answered. The patient knows to call the clinic with any problems, questions or concerns.  Earlie Server, MD, PhD [facility] Hematology Oncology 12/02/2022    Chief Complaint: [name] is a 71 y.o. male presents to follow up for chronic lymphocytic lymphoma.  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  He was treated with prednisone. 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  -Oct 2020 second opinion at [facility] and was evaluated by heme-onc Dr.Ellis.  Patient was recommended for watchful waiting   10/02/2014 - 10/23/2014 Chemotherapy   -10/02/2014- 10/23/2014  4 weekly cycles of Rituxan  -07/23/2017 Decision was made to start Ibrutinib as patient is very symptomatic (fatigue and weight loss, lack of appetite). Patient did not show up at the chemo education class   05/08/2022 Imaging   CT CHEST, ABDOMEN, AND PELVIS WITH CONTRAST 1 unchanged enlarged bilateral axillary lymph nodes and prominent pretracheal mediastinal lymph nodes. 2. However, when compared to most recent prior CT examination of the abdomen and pelvis dated 06/24/2016, there are numerous, extremely bulky lymph nodes throughout the abdomen and pelvis, which are greatly increased in size. Newly enlarged left inguinal lymph nodes, in keeping with reported palpable lymphadenopathy. 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 3. Mild right hydronephrosis; the proximal ureter appears compressed or obstructed by overlying lymphadenopathy. 4. Emphysema.5. Coronary artery disease.   05/30/2022 Imaging   PET scan showed  1. Again seen is massive nodal conglomeration within the abdomen with mildly diffuse increased tracer uptake with SUV max of 3.15.There also enlarged bilateral pelvic and left inguinal lymph nodes which exhibit mild increased tracer uptake compatible with residual/recurrent lymphoma. Imaging findings are compatible with residual/recurrent metabolically active low-grade lymphoma. 2. No significant tracer uptake associated with the borderline enlarged bilateral axillary lymph nodes.3. Unchanged appearance of small nonspecific subpleural nodules within the periphery of the upper lobes measuring up to 4 mm. These are too small to characterize by PET-CT.4. Aortic Atherosclerosis (ICD10-I70.0) and Emphysema (ICD10-J43.9). 5. Coronary artery calcifications. 6. Normal size  spleen with background activity similar to the liver   06/22/2022 -  Chemotherapy   acalabrutinib 100  mg twice daily.   07/27/2022 - 08/20/2022 Chemotherapy   Patient is on Treatment Plan : ITP Rituximab q7d x 4 cycles     11/09/2022 Imaging   CT abdomen pelvis w contrast  1. Progressive bulky retroperitoneal and mesenteric lymphadenopathy consistent with progressive lymphoma. 2. No evidence of bowel obstruction, perforation or abscess. 3. Stable small amount of free pelvic fluid. 4. Aortic Atherosclerosis (ICD10-I70.0). Chronically occluded right femoral arterial stent    11/09/2022 [facility] Admission   Patient presented to emergency room for evaluation of shortness of breath.  He has noticed progressive worsening of abdominal swelling and distention.  Associated with shortness of breath.  Patient was started on diuretics.     11/24/2022 Procedure   CT guided biopsy of abdomen mass  Pathology showed CLL/SLL Core biopsy sections demonstrate lymphoid tissue with partially effaced follicular architecture.  The bulk of the tissue consists of sheets of small lymphocytes with dense chromatin and scant cytoplasm.  There is no significant increase in larger prolymphocytic type cells.  Relatively few small germinal centers are appreciated.  Concurrent flow cytometric studies demonstrate a CD5+, CD23 + monoclonal B-cell population comprising 87% of total lymphoid cells and 100% of B cells.  The phenotype is compatible with chronic lymphocytic leukemia/small lymphocytic lymphoma.  There are no features in the current sample to suggest progression to higher grade process. Clinical and radiographic correlation is recommended.      INTERVAL HISTORY 71 y.o. male with history of CLL presents for follow-up. He has had multiple no show appointments.  No-show to this morning's appointment.  He was seen by pulmonology for shortness of breath today.  Respiratory symptom was felt to be primarily due to bulky  abdominal lymph nodes. Appointment rescheduled this afternoon and he was accompanied by his wife. Abdominal pain,he takes Tylenol with no significant improvement.  SOB with exertion.  No other new complaints.   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 Tasia Catchings   Hyperlipidemia    Low back pain    Peripheral vascular disease [employer])     Past Surgical History:  Procedure Laterality Date   COLONOSCOPY     COLONOSCOPY WITH PROPOFOL N/A 07/23/2015   Procedure: COLONOSCOPY WITH PROPOFOL;  Surgeon: [name], MD;  Location: [facility] ENDOSCOPY;  Service: Endoscopy;  Laterality: N/A;   COLONOSCOPY WITH PROPOFOL N/A 04/20/2022   Procedure: COLONOSCOPY WITH PROPOFOL;  Surgeon: Jonathon Bellows, MD;  Location[facility] ENDOSCOPY;  Service: Gastroenterology;  Laterality: N/A;   ESOPHAGOGASTRODUODENOSCOPY (EGD) WITH PROPOFOL N/A 08/20/2015   Procedure: ESOPHAGOGASTRODUODENOSCOPY (EGD) WITH PROPOFOL;  Surgeon: [name], MD;  Location: [facility] ENDOSCOPY;  Service: Endoscopy;  Laterality: N/A;   LOWER EXTREMITY ANGIOGRAPHY Left 05/19/2018   Procedure: LOWER EXTREMITY ANGIOGRAPHY;  Surgeon: Algernon Huxley, MD;  Location: [place] CV LAB;  Service: Cardiovascular;  Laterality: Left;   LOWER EXTREMITY ANGIOGRAPHY Right 06/15/2018   Procedure: LOWER EXTREMITY ANGIOGRAPHY;  Surgeon: Algernon Huxley, MD;  Location: [place] CV LAB;  Service: Cardiovascular;  Laterality: Right;   lymp node removal Right 2011   arm   left arm 2015   LYMPH NODE BIOPSY  08/2014    Family History  Problem Relation Age of Onset   Heart attack Father    Kidney cancer Neg Hx        lung cancer   Bladder Cancer Neg  Hx    Prostate cancer Neg Hx     Social History:  reports that he quit smoking about 4 months ago. His smoking use included cigars. He has never used smokeless tobacco. He reports that he does not drink alcohol and  does not use drugs.  Allergies:  Allergies  Allergen Reactions   Penicillins     Other reaction(s): Rash, Hives ()   Latex Rash   Review of Systems:  Review of Systems  Constitutional:  Positive for malaise/fatigue and weight loss. Negative for chills and fever.  HENT:  Negative for ear discharge, ear pain, nosebleeds and sore throat.   Eyes:  Negative for photophobia, pain and redness.  Respiratory:  Negative for cough, hemoptysis, sputum production, shortness of breath and wheezing.   Cardiovascular:  Positive for leg swelling. Negative for chest pain and palpitations.  Gastrointestinal:  Positive for abdominal pain. Negative for blood in stool, constipation, diarrhea, heartburn, nausea and vomiting.       Abdominal distention  Genitourinary:  Negative for dysuria and frequency.       Swelling of groin area.  Musculoskeletal:  Negative for myalgias and neck pain.  Skin:  Negative for rash.  Neurological:  Negative for dizziness, tingling, tremors, weakness and headaches.  Endo/Heme/Allergies:  Does not bruise/bleed easily.  Psychiatric/Behavioral:  Negative for depression, hallucinations and suicidal ideas.      Current Outpatient Medications on File Prior to Visit  Medication Sig Dispense Refill   aspirin EC 81 MG tablet Take 1 tablet (81 mg total) by mouth daily. Swallow whole. 90 tablet 3   hydrocortisone cream 1 % Apply 1 application. topically 2 (two) times daily. 30 g 0   montelukast (SINGULAIR) 10 MG tablet Take 1 tablet (10 mg total) by mouth See admin instructions. Take 1 tablet on the day of next appointment 1 tablet 0   pantoprazole (PROTONIX) 40 MG tablet Take 1 tablet (40 mg total) by mouth daily. Prior to breakfast, 30 min to 1 hr 30 tablet 3   rosuvastatin (CRESTOR) 10 MG tablet Take 1 tablet (10 mg total) by mouth daily. 90 tablet 3   tamsulosin (FLOMAX) 0.4 MG CAPS capsule Take 1 capsule (0.4 mg total) by mouth daily. Give 30 30 capsule 1   traZODone (DESYREL) 50  MG tablet Take 0.5-1 tablets (25-50 mg total) by mouth at bedtime as needed for sleep. 30 tablet 3   No current facility-administered medications on file prior to visit.    Physical Exam: Blood pressure 129/81, pulse 60, temperature (!) 96.3 F (35.7 C), resp. rate 18, weight 138 lb 1.6 oz (62.6 kg). Physical Exam Constitutional:      General: He is not in acute distress.    Appearance: He is not diaphoretic.  HENT:     Head: Normocephalic and atraumatic.     Nose: Nose normal.     Mouth/Throat:     Pharynx: No oropharyngeal exudate.  Eyes:     General: No scleral icterus.    Pupils: Pupils are equal, round, and reactive to light.  Cardiovascular:     Rate and Rhythm: Normal rate and regular rhythm.     Heart sounds: No murmur heard. Pulmonary:     Effort: Pulmonary effort is normal. No respiratory distress.     Breath sounds: No rales.  Chest:     Chest wall: No tenderness.  Abdominal:     General: There is distension.     Palpations: Abdomen is soft. There is mass.  Tenderness: There is no abdominal tenderness.  Genitourinary:    Comments: palpable left inguinal lymph node, tender  Musculoskeletal:        General: Normal range of motion.     Cervical back: Normal range of motion and neck supple.  Skin:    General: Skin is warm and dry.     Findings: No erythema.  Neurological:     Mental Status: He is alert and oriented to person, place, and time.     Cranial Nerves: No cranial nerve deficit.     Motor: No abnormal muscle tone.     Coordination: Coordination normal.  Psychiatric:        Mood and Affect: Affect normal.     LABORATORY RESULTS.    Latest Ref Rng & Units 11/18/2022    1:02 PM 11/18/2022   12:45 PM 11/09/2022    2:29 PM  CBC  WBC 4.0 - 10.5 K/uL 60.8  64.2  40.0   Hemoglobin 13.0 - 17.0 g/dL 8.8  8.7  9.1   Hematocrit 39.0 - 52.0 % 28.7  29.3  29.3   Platelets 150 - 400 K/uL 144  139  152       Latest Ref Rng & Units 11/18/2022    1:02  PM 11/18/2022   12:45 PM 11/09/2022    2:29 PM  CMP  Glucose 70 - 99 mg/dL 90  89  119   BUN 8 - 23 mg/dL _0 Creatinine 0.61 - 1.24 mg/dL 1.81  1.82  1.51   Sodium 135 - 145 mmol/L 139  146  143   Potassium 3.5 - 5.1 mmol/L 4.5  4.8  4.1   Chloride 98 - 111 mmol/L 109  114  115   CO2 22 - 32 mmol/L _1 Calcium 8.9 - 10.3 mg/dL 9.0  9.0  9.1   Total Protein 6.5 - 8.1 g/dL 6.6   6.1   Total Bilirubin 0.3 - 1.2 mg/dL 1.0   1.2   Alkaline Phos 38 - 126 U/L 87   75   AST 15 - 41 U/L 25   27   ALT 0 - 44 U/L 14   13      PATHOLOGY:  11/02/2016 Peripheral blood FISH studies revealed 20% of nuclei positive for loss of 1 ATM signal, 63% of nuclei positive for trisomy 12, and 32% of nuclei positive for loss of 1 TP53 signal. Results for CCND1/IGH and 13q were normal.  09/13/2014 Left axillary node biopsy on  confirmed B-cell small lymphocytic lymphoma (B-CLL/SLL)   IMAGE STUDIES CT ABDOMINAL MASS BIOPSY  Result Date: 11/24/2022 INDICATION: Chronic lymphocytic leukemia EXAM: CT-guided core needle biopsy of abdominal mass MEDICATIONS: None. ANESTHESIA/SEDATION: Moderate (conscious) sedation was employed during this procedure. A total of Versed 1 mg and Fentanyl 50 mcg was administered intravenously. Moderate Sedation Time: 12 minutes. The patient's level of consciousness and vital signs were monitored continuously by radiology nursing throughout the procedure under my direct supervision. FLUOROSCOPY TIME:  N/a COMPLICATIONS: None immediate. PROCEDURE: Informed written consent was obtained from the patient after a thorough discussion of the procedural risks, benefits and alternatives. All questions were addressed. Maximal Sterile Barrier Technique was utilized including caps, mask, sterile gowns, sterile gloves, sterile drape, hand hygiene and skin antiseptic. A timeout was performed prior to the initiation of the procedure. The patient was placed supine on the exam table. Limited  CT of the abdomen and pelvis  was performed for planning purposes. This demonstrated large abdominal mass. Skin entry site was marked, and the overlying skin was prepped and draped in the standard sterile fashion. Local analgesia was obtained with 1% lidocaine. Using intermittent CT fluoroscopy, a 17 gauge introducer needle was advanced towards the identified lesion. Subsequently, core needle biopsy was performed using an 18 gauge core biopsy device x6 total passes. Specimens were submitted in saline to pathology for further handling. Limited postprocedure imaging demonstrated no complicating feature. The patient tolerated the procedure well, and was transferred to recovery in stable condition. IMPRESSION: Successful CT-guided core needle biopsy of large abdominal mass. Electronically Signed   By: Albin Felling M.D.   On: 11/24/2022 14:07   CT ABDOMEN PELVIS W CONTRAST  Result Date: 11/09/2022 CLINICAL DATA:  Abdominal pain, acute, nonlocalized. Shortness of breath with abdominal pain, bloating, nausea, vomiting and diarrhea. History of chronic lymphocytic leukemia. * Tracking Code: BO * EXAM: CT ABDOMEN AND PELVIS WITH CONTRAST TECHNIQUE: Multidetector CT imaging of the abdomen and pelvis was performed using the standard protocol following bolus administration of intravenous contrast. RADIATION DOSE REDUCTION: This exam was performed according to the departmental dose-optimization program which includes automated exposure control, adjustment of the mA and/or kV according to patient size and/or use of iterative reconstruction technique. CONTRAST:  16m OMNIPAQUE IOHEXOL 300 MG/ML  SOLN COMPARISON:  Abdominopelvic CT 07/11/2022 and 05/08/2022. PET-CT 05/28/2022. FINDINGS: Lower chest: Clear lung bases. No significant pleural or pericardial effusion. Coronary artery atherosclerosis noted. Partially imaged right infrahilar lymph node measuring 9 mm on image 1/2. Increasing right retrocrural adenopathy, measuring  up to 1.4 cm short axis on image 18/2. Hepatobiliary: The liver is normal in density without suspicious focal abnormality. No evidence of gallstones, gallbladder wall thickening or biliary dilatation. Pancreas: Anteriorly and superiorly displaced by progressive extensive retroperitoneal adenopathy. No pancreatic ductal dilatation or surrounding inflammation. Spleen: Normal in size without focal abnormality. Adrenals/Urinary Tract: Both adrenal glands appear normal. No evidence of urinary tract calculus, suspicious renal lesion or hydronephrosis. The bladder appears unremarkable for its degree of distention. Stomach/Bowel: No enteric contrast administered. The stomach is anteriorly and superiorly displaced by progressive retroperitoneal and mesenteric lymphadenopathy. No evidence of bowel wall thickening, distention or surrounding inflammation. The appendix appears normal. Vascular/Lymphatic: Bulky retroperitoneal and mesenteric lymphadenopathy has progressed compared with the most recent CT. Conglomerate nodes in the gastrohepatic ligament measure up to 12.9 x 10.1 cm on image 20/2 (previously approximately 7.3 cm maximally). At the level of the iliac crest, there is a conglomerate of nodes surrounding the mesenteric vessels which measures up to 16.9 x 11.7 cm on image 49/2. Small pelvic and inguinal lymph nodes have mildly increased in size and number. Diffuse aortic and branch vessel atherosclerosis without evidence of aneurysm. Right femoral artery stent appears chronically occluded. There is extrinsic compression of the IVC and renal veins by the lymphadenopathy, but no evidence of large vessel occlusion. Reproductive: Stable calcifications within the prostate gland. Other: Small amount of free pelvic fluid appears similar to previous study. Small umbilical hernia containing only fat. No free air. Musculoskeletal: No acute or significant osseous findings. Chronic degenerative disc disease at L5-S1 with  associated discogenic sclerosis. IMPRESSION: 1. Progressive bulky retroperitoneal and mesenteric lymphadenopathy consistent with progressive lymphoma. 2. No evidence of bowel obstruction, perforation or abscess. 3. Stable small amount of free pelvic fluid. 4. Aortic Atherosclerosis (ICD10-I70.0). Chronically occluded right femoral arterial stent. Electronically Signed   By: WRichardean SaleM.D.   On: 11/09/2022 18:58  US Abdomen Limited RUQ (LIVER/GB)  Result Date: 11/09/2022 CLINICAL DATA:  [id number] Right upper quadrant pain [id number] EXAM: ULTRASOUND ABDOMEN LIMITED RIGHT UPPER QUADRANT COMPARISON:  CT abdomen pelvis 07/11/2022 FINDINGS: Gallbladder: Calcified gallstones within the gallbladder lumen. Non mobile hyperechoic foci along the gallbladder wall measuring up to 6 mm. No gallbladder wall thickening or pericholecystic fluid visualized. No sonographic Murphy sign noted by sonographer. Common bile duct: Diameter: 4 mm Liver: No focal lesion identified. Within normal limits in parenchymal echogenicity. Portal vein is patent on color Doppler imaging with normal direction of blood flow towards the liver. Other: Heterogeneous perigastric mass poorly visualized. IMPRESSION: 1. Cholelithiasis with no of acute cholecystitis. 2. Non-mobile hyperechoic foci of 6 mm along the gallbladder wall that may represent a gallbladder polyp. Recommend follow-up ultrasound in 6 months to evaluate stability in size. 3. Heterogeneous perigastric mass poorly visualized. Finding may correlate to finding of bulky adenopathy noted on CT abdomen pelvis 07/11/2022. Electronically Signed   By: Iven Finn M.D.   On: 11/09/2022 15:46   DG Chest 2 View  Result Date: 11/09/2022 CLINICAL DATA:  Two-week history of shortness of breath, abdominal pain, and bloating EXAM: CHEST - 2 VIEW COMPARISON:  Chest radiograph dated 07/12/2019 FINDINGS: Low lung volumes with bronchovascular crowding. No focal consolidations. No pleural effusion  or pneumothorax. The heart size and mediastinal contours are within normal limits. The visualized skeletal structures are unremarkable. Right axillary surgical clips. IMPRESSION: Low lung volumes with bronchovascular crowding. No focal consolidations. Electronically Signed   By: Darrin Nipper M.D.   On: 11/09/2022 14:48

## 2022-12-02 NOTE — Progress Notes (Signed)
Assessment & Plan:   1. Shortness of breath 2. CLL 3. Emphysema 4. Beta-thalassemia    The patient is presenting for follow up on shortness of breath. Workup so far in the emergency department has included a CT scan of his abdomen notable for bulky lymphadenopathy and blood work notable for anemia (chronic) and an AKI.   Review of his imaging is notable for a CT scan from May 2023 that shows mild centrilobular and paraseptal emphysema which would not explain the sudden decline in his breathing and sudden shortness of breath.  His lungs are clear on auscultation with no wheezing. The differential for his shortness of breath includes venous thromboembolism, enlarging mediastinal lymphadenopathy compressing on his airway or great vessels, cardiomyopathy (with possible pulmonary edema), and anemia. Held off on obtaining the CT/PE given the AKI. I suspect that his symptoms are mostly driven by the bulky lymphadenopathy, compressing his great vessels and resulting in the lower extremity edema as well as limiting the movement of his diaphragm. I suspect there is significant mediastinal lymphadenopathy as well. He will be seeing oncology today to re-initiate therapy. I will see him for follow up in 3 months to re-assess symptoms.  Return in about 3 months (around 03/03/2023).  I spent 25 minutes caring for this patient today, including preparing to see the patient, obtaining a medical history , reviewing a separately obtained history, performing a medically appropriate examination and/or evaluation, counseling and educating the patient/family/caregiver, and documenting clinical information in the electronic health record  Armando Reichert, MD [facility] 12/02/2022 4:14 PM    End of visit medications:  No orders of the defined types were placed in this encounter.    Current Outpatient Medications:    aspirin EC 81 MG tablet, Take 1 tablet (81 mg total) by mouth daily. Swallow  whole., Disp: 90 tablet, Rfl: 3   hydrocortisone cream 1 %, Apply 1 application. topically 2 (two) times daily., Disp: 30 g, Rfl: 0   montelukast (SINGULAIR) 10 MG tablet, Take 1 tablet (10 mg total) by mouth See admin instructions. Take 1 tablet on the day of next appointment, Disp: 1 tablet, Rfl: 0   pantoprazole (PROTONIX) 40 MG tablet, Take 1 tablet (40 mg total) by mouth daily. Prior to breakfast, 30 min to 1 hr, Disp: 30 tablet, Rfl: 3   rosuvastatin (CRESTOR) 10 MG tablet, Take 1 tablet (10 mg total) by mouth daily., Disp: 90 tablet, Rfl: 3   tamsulosin (FLOMAX) 0.4 MG CAPS capsule, Take 1 capsule (0.4 mg total) by mouth daily. Give 30, Disp: 30 capsule, Rfl: 1   traZODone (DESYREL) 50 MG tablet, Take 0.5-1 tablets (25-50 mg total) by mouth at bedtime as needed for sleep., Disp: 30 tablet, Rfl: 3   traMADol (ULTRAM) 50 MG tablet, Take 1 tablet (50 mg total) by mouth every 6 (six) hours as needed., Disp: 30 tablet, Rfl: 0   Subjective:   PATIENT ID: [name] GENDER: male DOB: [birth year], MRN: [id number]  Chief Complaint  Patient presents with   Follow-up    SOB with every step he takes. No wheezing. Cough with clear sputum.    HPI  [name] is a pleasant 71 year old male patient with a history of CLL who presents to clinic for the evaluation of shortness of breath. I last saw him on 11/18/2022 for an initial evaluation.  Symptoms are unchanged since his last visit. He was seen by cardiology on Monday but no showed the TTE. His BMP  showed an AKI so we held off on the CT/PE study. Abdominal imaging did show rapid progression with bulky lymphadenopathy.  He was seen in the emergency department on 11/09/2022 for his lower extremity edema where a CT scan of the abdomen was notable for increased lymphadenopathy consistent with progressive CLL, a diagnosis he carries. He was given Lasix which she has been using daily with mild improvement. The lower extremity edema is significantly  improved. He underwent repeat biopsy 11/24/2022. Otherwise, he has no fevers, no chills, no night sweats, and no rashes.  He does endorse feeling weak and fatigued which has also been progressive.   He was diagnosed with CLL in 2015 after initially presenting with hemolytic anemia, status posttreatment with steroids and rituximab. He also has a history of but the thalassemia minor with chronic microcytic anemia.   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  He reports very distant history of smoking cigarettes but quit many many years ago and currently only smokes cigars every now and then.  He denies any illicit drug use or other inhalational exposures.  Ancillary information including prior medications, full medical/surgical/family/social histories, and PFTs (when available) are listed below and have been reviewed.   Review of Systems  Constitutional:  Positive for malaise/fatigue and weight loss. Negative for chills and fever.  Respiratory:  Positive for shortness of breath. Negative for cough, hemoptysis, sputum production and wheezing.   Cardiovascular:  Positive for PND. Negative for chest pain and leg swelling.  Gastrointestinal:  Positive for heartburn. Negative for abdominal pain.       Abdominal fullness  Genitourinary:  Negative for dysuria.     Objective:   Vitals:   12/02/22 1446  BP: 124/78  Pulse: 61  Temp: (!) 97.5 F (36.4 C)  SpO2: 98%  Weight: 138 lb 3.2 oz (62.7 kg)  Height: '5\' 8"'$  (1.727 m)   98% on RA  BMI Readings from Last 3 Encounters:  12/02/22 21.01 kg/m  12/02/22 21.00 kg/m  11/30/22 20.89 kg/m   Wt Readings from Last 3 Encounters:  12/02/22 138 lb 3.2 oz (62.7 kg)  12/02/22 138 lb 1.6 oz (62.6 kg)  11/30/22 137 lb 6 oz (62.3 kg)    Physical Exam Constitutional:      General: He is not in acute distress.    Appearance: Normal appearance. He is not ill-appearing.  HENT:      Mouth/Throat:     Mouth: Mucous membranes are moist.  Eyes:     Pupils: Pupils are equal, round, and reactive to light.  Cardiovascular:     Rate and Rhythm: Regular rhythm. Bradycardia present.     Pulses: Normal pulses.     Heart sounds: Normal heart sounds.  Pulmonary:     Effort: Pulmonary effort is normal.     Breath sounds: Normal breath sounds. No wheezing or rales.  Abdominal:     General: There is distension.     Palpations: Abdomen is soft.  Musculoskeletal:        General: Normal range of motion.     Right lower leg: No edema.     Left lower leg: No edema.  Neurological:     General: No focal deficit present.     Mental Status: He is alert and oriented to person, place, and time. Mental status is at baseline.       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 Tasia Catchings   Hyperlipidemia    Low back pain    Peripheral vascular disease ([place])      Family History  Problem Relation Age of Onset   Heart attack Father    Kidney cancer Neg Hx        lung cancer   Bladder Cancer Neg Hx    Prostate cancer Neg Hx      Past Surgical History:  Procedure Laterality Date   COLONOSCOPY     COLONOSCOPY WITH PROPOFOL N/A 07/23/2015   Procedure: COLONOSCOPY WITH PROPOFOL;  Surgeon: [name], MD;  Location: [facility] ENDOSCOPY;  Service: Endoscopy;  Laterality: N/A;   COLONOSCOPY WITH PROPOFOL N/A 04/20/2022   Procedure: COLONOSCOPY WITH PROPOFOL;  Surgeon: Jonathon Bellows, MD;  Location: [facility] ENDOSCOPY;  Service: Gastroenterology;  Laterality: N/A;   ESOPHAGOGASTRODUODENOSCOPY (EGD) WITH PROPOFOL N/A 08/20/2015   Procedure: ESOPHAGOGASTRODUODENOSCOPY (EGD) WITH PROPOFOL;  Surgeon: [name], MD;  Location: [facility] ENDOSCOPY;  Service: Endoscopy;  Laterality: N/A;   LOWER EXTREMITY ANGIOGRAPHY Left 05/19/2018   Procedure: LOWER EXTREMITY ANGIOGRAPHY;   Surgeon: Algernon Huxley, MD;  Location: [place] CV LAB;  Service: Cardiovascular;  Laterality: Left;   LOWER EXTREMITY ANGIOGRAPHY Right 06/15/2018   Procedure: LOWER EXTREMITY ANGIOGRAPHY;  Surgeon: Algernon Huxley, MD;  Location: [place] CV LAB;  Service: Cardiovascular;  Laterality: Right;   lymp node removal Right 2011   arm   left arm 2015   LYMPH NODE BIOPSY  08/2014    Social History   Socioeconomic History   Marital status: Married    Spouse name: Not on file   Number of children: Not on file   Years of education: Not on file   Highest education level: Not on file  Occupational History   Not on file  Tobacco Use   Smoking status: Former    Types: Cigars    Quit date: 07/2022    Years since quitting: 0.3   Smokeless tobacco: Never   Tobacco comments:    .5 ppd last year, total of 48.5 pack year  Vaping Use   Vaping Use: Never used  Substance and Sexual Activity   Alcohol use: No   Drug use: No   Sexual activity: Yes  Other Topics Concern   Not on file  Social History Narrative   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    Let go from [facility] 11/2019 unexpectedly   Sons 2, daughters 4   10 grandchildren   Social Determinants of Health   Financial Resource Strain: Low Risk  (08/03/2022)   Overall Financial Resource Strain (CARDIA)    Difficulty of Paying Living Expenses: Not hard at all  Food Insecurity: No Food Insecurity (08/03/2022)   Hunger Vital Sign    Worried About Running Out of Food in the Last Year: Never true    Ran Out of Food in the Last Year: Never true  Transportation Needs: No Transportation Needs (08/03/2022)   PRAPARE - Hydrologist (Medical): No    Lack of Transportation (Non-Medical): No  Physical Activity: Sufficiently Active (08/03/2022)   Exercise Vital Sign    Days of Exercise per Week: 7 days    Minutes of Exercise per Session: 30 min  Stress: No Stress Concern Present (08/03/2022)   [place] -  Occupational Stress Questionnaire    Feeling of Stress : Not at all  Social Connections: Unknown (08/03/2022)   Social Connection and Isolation Panel [NHANES]    Frequency of Communication with Friends and Family: Not on file    Frequency of Social Gatherings with Friends and Family: Not on file    Attends Religious Services: Not on file    Active Member of Clubs or Organizations: Not on file    Attends Archivist Meetings: Not on file    Marital Status: Married  Intimate Partner Violence: Not At Risk (08/03/2022)   Humiliation, Afraid, Rape, and Kick questionnaire    Fear of Current or Ex-Partner: No    Emotionally Abused: No    Physically Abused: No    Sexually Abused: No     Allergies  Allergen Reactions   Penicillins     Other reaction(s): Rash, Hives ()   Latex Rash     CBC    Component Value Date/Time   WBC 60.8 (HH) 11/18/2022 1302   RBC 3.46 (L) 11/18/2022 1302   HGB 8.8 (L) 11/18/2022 1302   HGB 13.0 03/13/2015 0852   HCT 28.7 (L) 11/18/2022 1302   HCT 40.9 03/13/2015 0852   PLT 144 (L) 11/18/2022 1302   PLT 217 03/13/2015 0852   MCV 82.9 11/18/2022 1302   MCV 78 (L) 03/13/2015 0852   MCH 25.4 (L) 11/18/2022 1302   MCHC 30.7 11/18/2022 1302   RDW 16.5 (H) 11/18/2022 1302   RDW 16.0 (H) 03/13/2015 0852   LYMPHSABS 41.8 (H) 11/18/2022 1302   LYMPHSABS 3.9 (H) 03/13/2015 0852   MONOABS 17.0 (H) 11/18/2022 1302   MONOABS 0.4 03/13/2015 0852   EOSABS 0.2 11/18/2022 1302   EOSABS 0.4 03/13/2015 0852   BASOSABS 0.1 11/18/2022 1302   BASOSABS 0.1 03/13/2015 0852    Pulmonary Functions Testing Results:     No data to display          Outpatient Medications Prior to Visit  Medication Sig Dispense Refill   aspirin EC 81 MG tablet Take 1 tablet (81 mg total) by mouth daily. Swallow whole. 90 tablet 3   hydrocortisone cream 1 % Apply 1 application. topically 2 (two) times daily. 30 g 0   montelukast (SINGULAIR) 10  MG tablet Take 1 tablet (10 mg total) by mouth See admin instructions. Take 1 tablet on the day of next appointment 1 tablet 0   pantoprazole (PROTONIX) 40 MG tablet Take 1 tablet (40 mg total) by mouth daily. Prior to breakfast, 30 min to 1 hr 30 tablet 3   rosuvastatin (CRESTOR) 10 MG tablet Take 1 tablet (10 mg total) by mouth daily. 90 tablet 3   tamsulosin (FLOMAX) 0.4 MG CAPS capsule Take 1 capsule (0.4 mg total) by mouth daily. Give 30 30 capsule 1   traZODone (DESYREL) 50 MG tablet Take 0.5-1 tablets (25-50 mg total) by mouth at bedtime as needed for sleep. 30 tablet 3   No facility-administered medications prior to visit.

## 2022-12-02 NOTE — Addendum Note (Signed)
Addended by: Earlie Server on: 12/02/2022 07:12 PM   Modules accepted: Orders

## 2022-12-02 NOTE — Assessment & Plan Note (Signed)
Recommend smoke cessation.  

## 2022-12-02 NOTE — Assessment & Plan Note (Signed)
Secondary to CLL with bone marrow involvement Hemoglobin is worse.

## 2022-12-02 NOTE — Assessment & Plan Note (Addendum)
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 He prefers not to try reduced dose.  S/p Rituximab weekly x 4, CT shows disease progression. Refractory disease rapid progression now with bulky lymphadenopathy.   Patient declines PET scan. Abdominal mass biopsy confirmed CLL, no evidence of transformation Discussed about treatment option of Zanubrutinib 160 mg twice daily.  Rationale and potential side effects were reviewed and discussed with patient.  He agrees with the plan.  Will check insurance coverage.

## 2022-12-03 ENCOUNTER — Telehealth: Payer: Self-pay

## 2022-12-03 ENCOUNTER — Other Ambulatory Visit (HOSPITAL_COMMUNITY): Payer: Self-pay

## 2022-12-03 ENCOUNTER — Telehealth: Payer: Self-pay | Admitting: Pharmacist

## 2022-12-03 DIAGNOSIS — C9110 Chronic lymphocytic leukemia of B-cell type not having achieved remission: Secondary | ICD-10-CM

## 2022-12-03 MED ORDER — ZANUBRUTINIB 80 MG PO CAPS
160.0000 mg | ORAL_CAPSULE | Freq: Two times a day (BID) | ORAL | 0 refills | Status: DC
  Filled 2022-12-03 – 2022-12-07 (×2): qty 120, 30d supply, fill #0

## 2022-12-03 NOTE — Telephone Encounter (Signed)
Oral Oncology Patient Advocate Encounter  Prior Authorization for Brukinsa has been approved.    PA# [id number]  Effective dates: 12/03/22 through 06/01/23  Patients co-pay is $775.05.  Patient has active grants to bring co-pay to $0.    [name], [place] Patient [place]  [phone] (phone) [phone] (fax) 12/03/2022 9:19 AM

## 2022-12-03 NOTE — Telephone Encounter (Signed)
Oral Oncology Patient Advocate Encounter   Received notification that prior authorization for Brukinsa is required.   PA submitted on 12.14.23  Key [id number]  Status is pending     [name], [place] Patient [place]  [phone] (phone) [phone] (fax) 12/03/2022 9:18 AM

## 2022-12-03 NOTE — Telephone Encounter (Signed)
Oral Oncology Pharmacist Encounter  Received new prescription for Brukinsa (zanubrutinib) for the treatment of CLL, planned duration until disease progression or unacceptable drug toxicity.  CMP and CBC w/ Diff from 11/18/22 assessed, patient with SCr of 1.81 mg/dL (CrCl ~34.1 mL/min). Patient with WBC of 60.8 K/uL, secondary to CLL, Hgb 8.8 g/dL and pltc 144 K/uL. LDH elevated at 261 U/L. No baseline dose adjustments required at this time for Brukinsa.   Current medication list in Epic reviewed, DDIs with Brukinsa identified: Category C drug-drug interaction between Brukinsa and Aspirin - Brukinsa may increase antiplatelet effects of aspirin. Recommend continued monitoring for s/sx of bleeding as patient with Hgb of 8.8 g/dL and pltc 144 K/uL.  Evaluated chart and no patient barriers to medication adherence noted.   Patient agreement for treatment documented in MD note on 12/02/22.  Prescription has been e-scribed to the [facility] for benefits analysis and approval.  Oral Oncology Clinic will continue to follow for insurance authorization, copayment issues, initial counseling and start date.  Leron Croak, PharmD, BCPS, [facility] Hematology/Oncology Clinical Pharmacist [facility] and [place] [phone] 12/03/2022 8:35 AM

## 2022-12-04 NOTE — Telephone Encounter (Signed)
I have attempted to call patient to set up first fill and have not heard back yet.  First calls - 12/03/22  Second calls - 12/04/22    [name], [facility] Patient [place]  [phone] (phone) [phone] (fax) 12/04/2022 10:12 AM

## 2022-12-07 ENCOUNTER — Other Ambulatory Visit: Payer: Self-pay

## 2022-12-07 ENCOUNTER — Other Ambulatory Visit (HOSPITAL_COMMUNITY): Payer: Self-pay

## 2022-12-07 ENCOUNTER — Telehealth: Payer: Self-pay

## 2022-12-07 NOTE — Telephone Encounter (Signed)
-----   Message from Britt Boozer, [facility] sent at 12/07/2022  1:03 PM EST ----- Regarding: Brukinsa start date 12/09/22 Hi Dr. Tasia Catchings,  We were able to get in touch with Mr. Carmickle today. He will start his Brukinsa on 12/20. I wanted to pass this along so you were aware for follow up.  Thanks! Wells Guiles (pharmacist covering for [employer]) ----- Message ----- From: Joslyn Devon, CPhT Sent: 12/03/2022   7:58 AM EST To: Britt Boozer, [facility]   ----- Message ----- From: Earlie Server, MD Sent: 12/02/2022   7:18 PM EST To: Evelina Dun, RN; Oneida Arenas, [place]; #  Milta Deiters,  I plan to start him on Zanubrutinib treatment as soon as possible. He has recurrent CLL, with bulky abdominal lymph nodes. Not very complaint with follow up and sometime hard to reach.  If by any chance you can not reach him, please call his wife and leave message if she does not answer.  I plan to see him 1 week after the start of chemo.  Thanks.   Talbert Cage

## 2022-12-07 NOTE — Telephone Encounter (Signed)
Please schedule pt for lab/MD (cbc,cmp) approx 2 weeks from 12/20 and inform pt's wife of appt.

## 2022-12-07 NOTE — Telephone Encounter (Signed)
Oral Chemotherapy Pharmacist Encounter  I spoke with patient for overview of: Brukinsa (zanubrutinib) for the treatment of CLL, planned duration until disease progression or unacceptable toxicity.   Counseled patient on administration, dosing, side effects, monitoring, drug-food interactions, safe handling, storage, and disposal.  Patient will take Brukinsa '80mg'$  capsules, 2 capsules ('160mg'$ ) by mouth every 12 hours.  Patient knows to avoid grapefruit or grapefruit juice while on therapy with Brukinsa.  Brukinsa start date: 12/09/22  Adverse effects include but are not limited to: bruising, increased bleeding, decreased blood counts, rash, diarrhea, fatigue, musculoskeletal pain/arthralgias, hyperglycemia. Patient will obtain anti diarrheal and alert the office of 4 or more loose stools above baseline.  Reviewed with patient importance of keeping a medication schedule and plan for any missed doses. No barriers to medication adherence identified.  Medication reconciliation performed and medication/allergy list updated.  All questions answered.  Mr. Kleinpeter voiced understanding and appreciation.   Medication education handout placed in mail for patient. Patient knows to call the office with questions or concerns. Oral Chemotherapy Clinic phone number provided to patient.   Leron Croak, PharmD, BCPS, [facility] Hematology/Oncology Clinical Pharmacist [facility] and [place] [phone] 12/07/2022 12:58 PM

## 2022-12-07 NOTE — Telephone Encounter (Signed)
Was able to get in touch with patient for [employer] and setting up delivery through [facility].   [name], [place] Patient [place]  [phone] (phone) [phone] (fax) 12/07/2022 1:03 PM

## 2022-12-16 ENCOUNTER — Ambulatory Visit: Payer: Medicare HMO | Admitting: Family

## 2022-12-19 DIAGNOSIS — R252 Cramp and spasm: Secondary | ICD-10-CM | POA: Diagnosis not present

## 2022-12-22 ENCOUNTER — Other Ambulatory Visit (HOSPITAL_COMMUNITY): Payer: Self-pay

## 2022-12-23 ENCOUNTER — Other Ambulatory Visit: Payer: Self-pay

## 2022-12-23 ENCOUNTER — Encounter: Payer: Self-pay | Admitting: Oncology

## 2022-12-23 ENCOUNTER — Inpatient Hospital Stay: Payer: Medicare HMO | Admitting: Oncology

## 2022-12-23 ENCOUNTER — Inpatient Hospital Stay: Payer: Medicare HMO | Attending: Oncology

## 2022-12-23 ENCOUNTER — Other Ambulatory Visit: Payer: Self-pay | Admitting: Oncology

## 2022-12-23 VITALS — BP 133/74 | HR 98 | Temp 98.0°F | Wt 117.0 lb

## 2022-12-23 DIAGNOSIS — Z87891 Personal history of nicotine dependence: Secondary | ICD-10-CM | POA: Diagnosis not present

## 2022-12-23 DIAGNOSIS — Z720 Tobacco use: Secondary | ICD-10-CM | POA: Diagnosis not present

## 2022-12-23 DIAGNOSIS — C9110 Chronic lymphocytic leukemia of B-cell type not having achieved remission: Secondary | ICD-10-CM

## 2022-12-23 DIAGNOSIS — D649 Anemia, unspecified: Secondary | ICD-10-CM | POA: Insufficient documentation

## 2022-12-23 DIAGNOSIS — R634 Abnormal weight loss: Secondary | ICD-10-CM

## 2022-12-23 DIAGNOSIS — Z5111 Encounter for antineoplastic chemotherapy: Secondary | ICD-10-CM

## 2022-12-23 LAB — COMPREHENSIVE METABOLIC PANEL
ALT: 12 U/L (ref 0–44)
AST: 27 U/L (ref 15–41)
Albumin: 3.9 g/dL (ref 3.5–5.0)
Alkaline Phosphatase: 94 U/L (ref 38–126)
Anion gap: 10 (ref 5–15)
BUN: 27 mg/dL — ABNORMAL HIGH (ref 8–23)
CO2: 23 mmol/L (ref 22–32)
Calcium: 8.7 mg/dL — ABNORMAL LOW (ref 8.9–10.3)
Chloride: 99 mmol/L (ref 98–111)
Creatinine, Ser: 1.62 mg/dL — ABNORMAL HIGH (ref 0.61–1.24)
GFR, Estimated: 46 mL/min — ABNORMAL LOW (ref >60)
Glucose, Bld: 113 mg/dL — ABNORMAL HIGH (ref 70–99)
Potassium: 4.7 mmol/L (ref 3.5–5.1)
Sodium: 132 mmol/L — ABNORMAL LOW (ref 135–145)
Total Bilirubin: 1.3 mg/dL — ABNORMAL HIGH (ref 0.3–1.2)
Total Protein: 7.3 g/dL (ref 6.5–8.1)

## 2022-12-23 LAB — CBC WITH DIFFERENTIAL/PLATELET
Abs Immature Granulocytes: 0.08 10*3/uL — ABNORMAL HIGH (ref 0.00–0.07)
Basophils Absolute: 0.1 10*3/uL (ref 0.0–0.1)
Basophils Relative: 0 %
Eosinophils Absolute: 0.1 10*3/uL (ref 0.0–0.5)
Eosinophils Relative: 0 %
HCT: 26 % — ABNORMAL LOW (ref 39.0–52.0)
Hemoglobin: 7 g/dL — ABNORMAL LOW (ref 13.0–17.0)
Immature Granulocytes: 0 %
Lymphocytes Relative: 94 %
Lymphs Abs: 234.2 10*3/uL — ABNORMAL HIGH (ref 0.7–4.0)
MCH: 23.6 pg — ABNORMAL LOW (ref 26.0–34.0)
MCHC: 26.9 g/dL — ABNORMAL LOW (ref 30.0–36.0)
MCV: 87.5 fL (ref 80.0–100.0)
Monocytes Absolute: 11.2 10*3/uL — ABNORMAL HIGH (ref 0.1–1.0)
Monocytes Relative: 5 %
Neutro Abs: 1.5 10*3/uL — ABNORMAL LOW (ref 1.7–7.7)
Neutrophils Relative %: 1 %
Platelets: 178 10*3/uL (ref 150–400)
RBC: 2.97 MIL/uL — ABNORMAL LOW (ref 4.22–5.81)
RDW: 20.9 % — ABNORMAL HIGH (ref 11.5–15.5)
Smear Review: NORMAL
WBC: 247.1 10*3/uL (ref 4.0–10.5)
nRBC: 0 % (ref 0.0–0.2)

## 2022-12-23 LAB — PREPARE RBC (CROSSMATCH)

## 2022-12-23 MED ORDER — DEXAMETHASONE 4 MG PO TABS: 4.0000 mg | ORAL_TABLET | Freq: Every day | ORAL | 0 refills | Status: DC

## 2022-12-23 NOTE — Progress Notes (Signed)
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 He prefers not to try reduced dose.  S/p Rituximab weekly x 4, CT shows disease progression. Refractory disease rapid progression now with bulky lymphadenopathy.   Patient declines PET scan. Abdominal mass biopsy confirmed CLL, no evidence of transformation Labs reviewed and discussed with patient.  Overall he tolerates Zanubrutinib 160 mg twice daily.  Clinical he is responding.  Leukocytosis is expected. Recommend patient to continue.  Tobacco abuse Recommend smoke cessation.  Normocytic anemia Secondary to CLL with bone marrow involvement Hemoglobin 7, patient symptomatic.  Recommend 1 unit of PRBC transfusion  Encounter for antineoplastic chemotherapy CLL treatment as listed above  Weight loss Refer to nutritionist  Recommend dexamethasone 4 mg daily for 5 to 6 days to increase appetite. Samples of nutritional supplements provided to patient.    Follow-up  Per LOS  All questions were answered. The patient knows to call the clinic with any problems, questions or concerns.  Joseph Server, MD, PhD [facility] Hematology Oncology 12/23/2022    Chief Complaint: [name] is a 72 y.o. male presents to follow up for chronic lymphocytic lymphoma.  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  He was treated with prednisone. 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  -Oct 2020 second opinion at [facility] and was evaluated by heme-onc [name].  Patient was recommended for watchful waiting   10/02/2014 - 10/23/2014 Chemotherapy   -10/02/2014- 10/23/2014  4 weekly cycles of Rituxan  -07/23/2017 Decision was made to start Ibrutinib as patient is very symptomatic (fatigue and weight loss, lack of appetite). Patient did not show up at the chemo education class   05/08/2022 Imaging   CT CHEST, ABDOMEN, AND PELVIS WITH CONTRAST 1 unchanged enlarged bilateral axillary lymph nodes and prominent pretracheal mediastinal lymph nodes. 2. However, when compared to most recent prior CT examination of the abdomen and pelvis dated 06/24/2016, there are numerous, extremely bulky lymph nodes throughout the abdomen and pelvis, which are greatly increased in size. Newly enlarged left inguinal lymph nodes, in keeping with reported palpable lymphadenopathy. 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 3. Mild right hydronephrosis; the proximal ureter appears compressed or obstructed by overlying lymphadenopathy. 4. Emphysema.5. Coronary artery disease.   05/30/2022 Imaging   PET scan showed  1. Again seen is massive nodal conglomeration within the abdomen with mildly diffuse increased tracer uptake with SUV max of 3.15.There also enlarged bilateral pelvic and left inguinal lymph nodes which exhibit mild increased tracer uptake compatible with residual/recurrent lymphoma. Imaging findings are compatible with residual/recurrent metabolically active low-grade lymphoma. 2. No significant tracer uptake associated with the borderline enlarged bilateral axillary lymph nodes.3.  Unchanged appearance of small nonspecific subpleural nodules within the periphery of the upper lobes measuring up to  4 mm. These are too small to characterize by PET-CT.4. Aortic Atherosclerosis (ICD10-I70.0) and Emphysema (ICD10-J43.9). 5. Coronary artery calcifications. 6. Normal size spleen with background activity similar to the liver   06/22/2022 -  Chemotherapy   acalabrutinib 100 mg twice daily.   07/27/2022 - 08/20/2022 Chemotherapy   Patient is on Treatment Plan : ITP Rituximab q7d x 4 cycles     11/09/2022 Imaging   CT abdomen pelvis w contrast  1. Progressive bulky retroperitoneal and mesenteric lymphadenopathy consistent with progressive lymphoma. 2. No evidence of bowel obstruction, perforation or abscess. 3. Stable small amount of free pelvic fluid. 4. Aortic Atherosclerosis (ICD10-I70.0). Chronically occluded right femoral arterial stent    11/09/2022 [facility] Admission   Patient presented to emergency room for evaluation of shortness of breath.  He has noticed progressive worsening of abdominal swelling and distention.  Associated with shortness of breath.  Patient was started on diuretics.     11/24/2022 Procedure   CT guided biopsy of abdomen mass  Pathology showed CLL/SLL Core biopsy sections demonstrate lymphoid tissue with partially effaced follicular architecture.  The bulk of the tissue consists of sheets of small lymphocytes with dense chromatin and scant cytoplasm.  There is no significant increase in larger prolymphocytic type cells.  Relatively few small germinal centers are appreciated.  Concurrent flow cytometric studies demonstrate a CD5+, CD23 + monoclonal B-cell population comprising 87% of total lymphoid cells and 100% of B cells.  The phenotype is compatible with chronic lymphocytic leukemia/small lymphocytic lymphoma.  There are no features in the current sample to suggest progression to higher grade process. Clinical and radiographic correlation is  recommended.      INTERVAL HISTORY 72 y.o. male with history of CLL presents for follow-up. Patient has been on Zanubrutinib 160 mg twice daily for 2 weeks. Patient has felt decreased appetite for the past 4 days.  Weight loss Increased fatigue.  Denies any nausea vomiting diarrhea. Abdominal pressure/discomfort has decreased.  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 Tasia Catchings   Hyperlipidemia    Low back pain    Peripheral vascular disease [facility])     Past Surgical History:  Procedure Laterality Date   COLONOSCOPY     COLONOSCOPY WITH PROPOFOL N/A 07/23/2015   Procedure: COLONOSCOPY WITH PROPOFOL;  Surgeon: [name], MD;  Location: [facility] ENDOSCOPY;  Service: Endoscopy;  Laterality: N/A;   COLONOSCOPY WITH PROPOFOL N/A 04/20/2022   Procedure: COLONOSCOPY WITH PROPOFOL;  Surgeon: Jonathon Bellows, MD;  Location: [facility] ENDOSCOPY;  Service: Gastroenterology;  Laterality: N/A;   ESOPHAGOGASTRODUODENOSCOPY (EGD) WITH PROPOFOL N/A 08/20/2015   Procedure: ESOPHAGOGASTRODUODENOSCOPY (EGD) WITH PROPOFOL;  Surgeon: [name], MD;  Location: [facility] ENDOSCOPY;  Service: Endoscopy;  Laterality: N/A;   LOWER EXTREMITY ANGIOGRAPHY Left 05/19/2018   Procedure: LOWER EXTREMITY ANGIOGRAPHY;  Surgeon: Algernon Huxley, MD;  Location: [place] CV LAB;  Service: Cardiovascular;  Laterality: Left;   LOWER EXTREMITY ANGIOGRAPHY Right 06/15/2018   Procedure: LOWER EXTREMITY ANGIOGRAPHY;  Surgeon: Algernon Huxley, MD;  Location: [place] CV LAB;  Service: Cardiovascular;  Laterality: Right;   lymp node removal Right 2011   arm   left arm 2015   LYMPH NODE BIOPSY  08/2014    Family History  Problem Relation Age of Onset   Heart  attack Father    Kidney cancer Neg Hx        lung cancer   Bladder Cancer Neg Hx    Prostate cancer Neg Hx      Social History:  reports that he quit smoking about 5 months ago. His smoking use included cigars. He has never used smokeless tobacco. He reports that he does not drink alcohol and does not use drugs.  Allergies:  Allergies  Allergen Reactions   Penicillins     Other reaction(s): Rash, Hives ()   Latex Rash   Review of Systems:  Review of Systems  Constitutional:  Positive for malaise/fatigue and weight loss. Negative for chills and fever.  HENT:  Negative for ear discharge, ear pain, nosebleeds and sore throat.   Eyes:  Negative for photophobia, pain and redness.  Respiratory:  Negative for cough, hemoptysis, sputum production, shortness of breath and wheezing.   Cardiovascular:  Negative for chest pain, palpitations and leg swelling.  Gastrointestinal:  Negative for abdominal pain, blood in stool, constipation, diarrhea, heartburn, nausea and vomiting.       Abdominal distention  Genitourinary:  Negative for dysuria and frequency.       Swelling of groin area.  Musculoskeletal:  Negative for myalgias and neck pain.  Skin:  Negative for rash.  Neurological:  Negative for dizziness, tingling, tremors, weakness and headaches.  Endo/Heme/Allergies:  Does not bruise/bleed easily.  Psychiatric/Behavioral:  Negative for depression, hallucinations and suicidal ideas.      Current Outpatient Medications on File Prior to Visit  Medication Sig Dispense Refill   aspirin EC 81 MG tablet Take 1 tablet (81 mg total) by mouth daily. Swallow whole. 90 tablet 3   hydrocortisone cream 1 % Apply 1 application. topically 2 (two) times daily. 30 g 0   montelukast (SINGULAIR) 10 MG tablet Take 1 tablet (10 mg total) by mouth See admin instructions. Take 1 tablet on the day of next appointment 1 tablet 0   pantoprazole (PROTONIX) 40 MG tablet Take 1 tablet (40 mg total) by mouth daily. Prior to breakfast, 30 min to 1 hr 30 tablet 3   rosuvastatin (CRESTOR) 10 MG tablet Take 1 tablet (10 mg total)  by mouth daily. 90 tablet 3   tamsulosin (FLOMAX) 0.4 MG CAPS capsule Take 1 capsule (0.4 mg total) by mouth daily. Give 30 30 capsule 1   traMADol (ULTRAM) 50 MG tablet Take 1 tablet (50 mg total) by mouth every 6 (six) hours as needed. 30 tablet 0   traZODone (DESYREL) 50 MG tablet Take 0.5-1 tablets (25-50 mg total) by mouth at bedtime as needed for sleep. 30 tablet 3   zanubrutinib (BRUKINSA) 80 MG capsule Take 2 capsules (160 mg total) by mouth 2 (two) times daily. 120 capsule 0   No current facility-administered medications on file prior to visit.    Physical Exam: Blood pressure 133/74, pulse 98, temperature 98 F (36.7 C), temperature source Tympanic, weight 117 lb (53.1 kg), SpO2 100 %. Physical Exam Constitutional:      General: He is not in acute distress.    Appearance: He is not diaphoretic.  HENT:     Head: Normocephalic and atraumatic.     Nose: Nose normal.     Mouth/Throat:     Pharynx: No oropharyngeal exudate.  Eyes:     General: No scleral icterus.    Pupils: Pupils are equal, round, and reactive to light.  Cardiovascular:     Rate and Rhythm: Normal rate  and regular rhythm.     Heart sounds: No murmur heard. Pulmonary:     Effort: Pulmonary effort is normal. No respiratory distress.     Breath sounds: No rales.  Chest:     Chest wall: No tenderness.  Abdominal:     General: There is no distension.     Palpations: Abdomen is soft.     Tenderness: There is no abdominal tenderness.     Comments: Abdominal distention/mass has decreased  Genitourinary:    Comments: palpable left inguinal lymph node, tender, decreased in size.  Musculoskeletal:        General: Normal range of motion.     Cervical back: Normal range of motion and neck supple.  Skin:    General: Skin is warm and dry.     Findings: No erythema.  Neurological:     Mental Status: He is alert and oriented to person, place, and time.     Cranial Nerves: No cranial nerve deficit.     Motor: No  abnormal muscle tone.     Coordination: Coordination normal.  Psychiatric:        Mood and Affect: Affect normal.    Crenshaw served as chaperone during physical examination. LABORATORY RESULTS.    Latest Ref Rng & Units 12/23/2022    1:07 PM 11/18/2022    1:02 PM 11/18/2022   12:45 PM  CBC  WBC 4.0 - 10.5 K/uL 247.1  60.8  64.2   Hemoglobin 13.0 - 17.0 g/dL 7.0  8.8  8.7   Hematocrit 39.0 - 52.0 % 26.0  28.7  29.3   Platelets 150 - 400 K/uL 178  144  139       Latest Ref Rng & Units 12/23/2022    1:07 PM 11/18/2022    1:02 PM 11/18/2022   12:45 PM  CMP  Glucose 70 - 99 mg/dL 113  90  89   BUN 8 - 23 mg/dL _0 Creatinine 0.61 - 1.24 mg/dL 1.62  1.81  1.82   Sodium 135 - 145 mmol/L 132  139  146   Potassium 3.5 - 5.1 mmol/L 4.7  4.5  4.8   Chloride 98 - 111 mmol/L 99  109  114   CO2 22 - 32 mmol/L _1 Calcium 8.9 - 10.3 mg/dL 8.7  9.0  9.0   Total Protein 6.5 - 8.1 g/dL 7.3  6.6    Total Bilirubin 0.3 - 1.2 mg/dL 1.3  1.0    Alkaline Phos 38 - 126 U/L 94  87    AST 15 - 41 U/L 27  25    ALT 0 - 44 U/L 12  14       PATHOLOGY:  11/02/2016 Peripheral blood FISH studies revealed 20% of nuclei positive for loss of 1 ATM signal, 63% of nuclei positive for trisomy 12, and 32% of nuclei positive for loss of 1 TP53 signal. Results for CCND1/IGH and 13q were normal.  09/13/2014 Left axillary node biopsy on  confirmed B-cell small lymphocytic lymphoma (B-CLL/SLL)   IMAGE STUDIES CT ABDOMINAL MASS BIOPSY  Result Date: 11/24/2022 INDICATION: Chronic lymphocytic leukemia EXAM: CT-guided core needle biopsy of abdominal mass MEDICATIONS: None. ANESTHESIA/SEDATION: Moderate (conscious) sedation was employed during this procedure. A total of Versed 1 mg and Fentanyl 50 mcg was administered intravenously. Moderate Sedation Time: 12 minutes. The patient's level of consciousness and vital signs were monitored continuously by radiology nursing  throughout the procedure  under my direct supervision. FLUOROSCOPY TIME:  N/a COMPLICATIONS: None immediate. PROCEDURE: Informed written consent was obtained from the patient after a thorough discussion of the procedural risks, benefits and alternatives. All questions were addressed. Maximal Sterile Barrier Technique was utilized including caps, mask, sterile gowns, sterile gloves, sterile drape, hand hygiene and skin antiseptic. A timeout was performed prior to the initiation of the procedure. The patient was placed supine on the exam table. Limited CT of the abdomen and pelvis was performed for planning purposes. This demonstrated large abdominal mass. Skin entry site was marked, and the overlying skin was prepped and draped in the standard sterile fashion. Local analgesia was obtained with 1% lidocaine. Using intermittent CT fluoroscopy, a 17 gauge introducer needle was advanced towards the identified lesion. Subsequently, core needle biopsy was performed using an 18 gauge core biopsy device x6 total passes. Specimens were submitted in saline to pathology for further handling. Limited postprocedure imaging demonstrated no complicating feature. The patient tolerated the procedure well, and was transferred to recovery in stable condition. IMPRESSION: Successful CT-guided core needle biopsy of large abdominal mass. Electronically Signed   By: Albin Felling M.D.   On: 11/24/2022 14:07   CT ABDOMEN PELVIS W CONTRAST  Result Date: 11/09/2022 CLINICAL DATA:  Abdominal pain, acute, nonlocalized. Shortness of breath with abdominal pain, bloating, nausea, vomiting and diarrhea. History of chronic lymphocytic leukemia. * Tracking Code: BO * EXAM: CT ABDOMEN AND PELVIS WITH CONTRAST TECHNIQUE: Multidetector CT imaging of the abdomen and pelvis was performed using the standard protocol following bolus administration of intravenous contrast. RADIATION DOSE REDUCTION: This exam was performed according to the departmental dose-optimization program  which includes automated exposure control, adjustment of the mA and/or kV according to patient size and/or use of iterative reconstruction technique. CONTRAST:  56m OMNIPAQUE IOHEXOL 300 MG/ML  SOLN COMPARISON:  Abdominopelvic CT 07/11/2022 and 05/08/2022. PET-CT 05/28/2022. FINDINGS: Lower chest: Clear lung bases. No significant pleural or pericardial effusion. Coronary artery atherosclerosis noted. Partially imaged right infrahilar lymph node measuring 9 mm on image 1/2. Increasing right retrocrural adenopathy, measuring up to 1.4 cm short axis on image 18/2. Hepatobiliary: The liver is normal in density without suspicious focal abnormality. No evidence of gallstones, gallbladder wall thickening or biliary dilatation. Pancreas: Anteriorly and superiorly displaced by progressive extensive retroperitoneal adenopathy. No pancreatic ductal dilatation or surrounding inflammation. Spleen: Normal in size without focal abnormality. Adrenals/Urinary Tract: Both adrenal glands appear normal. No evidence of urinary tract calculus, suspicious renal lesion or hydronephrosis. The bladder appears unremarkable for its degree of distention. Stomach/Bowel: No enteric contrast administered. The stomach is anteriorly and superiorly displaced by progressive retroperitoneal and mesenteric lymphadenopathy. No evidence of bowel wall thickening, distention or surrounding inflammation. The appendix appears normal. Vascular/Lymphatic: Bulky retroperitoneal and mesenteric lymphadenopathy has progressed compared with the most recent CT. Conglomerate nodes in the gastrohepatic ligament measure up to 12.9 x 10.1 cm on image 20/2 (previously approximately 7.3 cm maximally). At the level of the iliac crest, there is a conglomerate of nodes surrounding the mesenteric vessels which measures up to 16.9 x 11.7 cm on image 49/2. Small pelvic and inguinal lymph nodes have mildly increased in size and number. Diffuse aortic and branch vessel  atherosclerosis without evidence of aneurysm. Right femoral artery stent appears chronically occluded. There is extrinsic compression of the IVC and renal veins by the lymphadenopathy, but no evidence of large vessel occlusion. Reproductive: Stable calcifications within the prostate gland. Other: Small amount of free pelvic  fluid appears similar to previous study. Small umbilical hernia containing only fat. No free air. Musculoskeletal: No acute or significant osseous findings. Chronic degenerative disc disease at L5-S1 with associated discogenic sclerosis. IMPRESSION: 1. Progressive bulky retroperitoneal and mesenteric lymphadenopathy consistent with progressive lymphoma. 2. No evidence of bowel obstruction, perforation or abscess. 3. Stable small amount of free pelvic fluid. 4. Aortic Atherosclerosis (ICD10-I70.0). Chronically occluded right femoral arterial stent. Electronically Signed   By: Richardean Sale M.D.   On: 11/09/2022 18:58   US Abdomen Limited RUQ (LIVER/GB)  Result Date: 11/09/2022 CLINICAL DATA:  [id number] Right upper quadrant pain [id number] EXAM: ULTRASOUND ABDOMEN LIMITED RIGHT UPPER QUADRANT COMPARISON:  CT abdomen pelvis 07/11/2022 FINDINGS: Gallbladder: Calcified gallstones within the gallbladder lumen. Non mobile hyperechoic foci along the gallbladder wall measuring up to 6 mm. No gallbladder wall thickening or pericholecystic fluid visualized. No sonographic Murphy sign noted by sonographer. Common bile duct: Diameter: 4 mm Liver: No focal lesion identified. Within normal limits in parenchymal echogenicity. Portal vein is patent on color Doppler imaging with normal direction of blood flow towards the liver. Other: Heterogeneous perigastric mass poorly visualized. IMPRESSION: 1. Cholelithiasis with no of acute cholecystitis. 2. Non-mobile hyperechoic foci of 6 mm along the gallbladder wall that may represent a gallbladder polyp. Recommend follow-up ultrasound in 6 months to evaluate stability  in size. 3. Heterogeneous perigastric mass poorly visualized. Finding may correlate to finding of bulky adenopathy noted on CT abdomen pelvis 07/11/2022. Electronically Signed   By: Iven Finn M.D.   On: 11/09/2022 15:46   DG Chest 2 View  Result Date: 11/09/2022 CLINICAL DATA:  Two-week history of shortness of breath, abdominal pain, and bloating EXAM: CHEST - 2 VIEW COMPARISON:  Chest radiograph dated 07/12/2019 FINDINGS: Low lung volumes with bronchovascular crowding. No focal consolidations. No pleural effusion or pneumothorax. The heart size and mediastinal contours are within normal limits. The visualized skeletal structures are unremarkable. Right axillary surgical clips. IMPRESSION: Low lung volumes with bronchovascular crowding. No focal consolidations. Electronically Signed   By: Darrin Nipper M.D.   On: 11/09/2022 14:48

## 2022-12-23 NOTE — Assessment & Plan Note (Signed)
Recommend smoke cessation.  

## 2022-12-23 NOTE — Assessment & Plan Note (Signed)
Secondary to CLL with bone marrow involvement Hemoglobin 7, patient symptomatic.  Recommend 1 unit of PRBC transfusion

## 2022-12-23 NOTE — Assessment & Plan Note (Signed)
Refer to nutritionist  Recommend dexamethasone 4 mg daily for 5 to 6 days to increase appetite. Samples of nutritional supplements provided to patient.

## 2022-12-23 NOTE — Assessment & Plan Note (Addendum)
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 He prefers not to try reduced dose.  S/p Rituximab weekly x 4, CT shows disease progression. Refractory disease rapid progression now with bulky lymphadenopathy.   Patient declines PET scan. Abdominal mass biopsy confirmed CLL, no evidence of transformation Labs reviewed and discussed with patient.  Overall he tolerates Zanubrutinib 160 mg twice daily.  Clinical he is responding.  Leukocytosis is expected. Recommend patient to continue.

## 2022-12-23 NOTE — Assessment & Plan Note (Signed)
CLL treatment as listed above

## 2022-12-24 ENCOUNTER — Inpatient Hospital Stay: Payer: Medicare HMO | Admitting: Pharmacist

## 2022-12-24 ENCOUNTER — Other Ambulatory Visit (HOSPITAL_COMMUNITY): Payer: Self-pay

## 2022-12-24 ENCOUNTER — Inpatient Hospital Stay: Payer: Medicare HMO

## 2022-12-24 DIAGNOSIS — C9110 Chronic lymphocytic leukemia of B-cell type not having achieved remission: Secondary | ICD-10-CM

## 2022-12-24 DIAGNOSIS — D649 Anemia, unspecified: Secondary | ICD-10-CM | POA: Diagnosis not present

## 2022-12-24 DIAGNOSIS — Z87891 Personal history of nicotine dependence: Secondary | ICD-10-CM | POA: Diagnosis not present

## 2022-12-24 MED ORDER — DIPHENHYDRAMINE HCL 25 MG PO CAPS
25.0000 mg | ORAL_CAPSULE | Freq: Once | ORAL | Status: AC
  Administered 2022-12-24: 25 mg via ORAL
  Filled 2022-12-24: qty 1

## 2022-12-24 MED ORDER — ACETAMINOPHEN 325 MG PO TABS
650.0000 mg | ORAL_TABLET | Freq: Once | ORAL | Status: AC
  Administered 2022-12-24: 650 mg via ORAL
  Filled 2022-12-24: qty 2

## 2022-12-24 MED ORDER — SODIUM CHLORIDE 0.9% IV SOLUTION
250.0000 mL | Freq: Once | INTRAVENOUS | Status: AC
  Administered 2022-12-24: 250 mL via INTRAVENOUS
  Filled 2022-12-24: qty 250

## 2022-12-24 NOTE — Progress Notes (Signed)
[place]  [phone] [phone]  Patient Care Team: [name], FNP as PCP - General (Family Medicine) Rockey Situ Kathlene November, MD as PCP - Cardiology (Cardiology) Bary Castilla, Forest Gleason, MD (General Surgery) Forest Gleason, MD (Unknown Physician Specialty) Earlie Server, MD as Consulting Physician (Oncology)   Name of the patient: [name]  [id number]  August 04, 1951   Date of visit: 12/24/22  HPI: Patient is a 72 y.o. 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 He is now being treated with Brukinsa (zanubrutinib) which he started on 12/09/22.  Reason for Consult: Oral chemotherapy follow-up for zanubrutinib therapy.   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 Tasia Catchings   Hyperlipidemia    Low back pain    Peripheral vascular disease [facility])     HEMATOLOGY/ONCOLOGY HISTORY:  Oncology History  Chronic lymphocytic leukemia (CLL), B-cell ([place])  09/13/2014 Initial Diagnosis   CLL - hemolytic anemia in 01/2014.  He was treated with prednisone. 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  -Oct 2020 second opinion at [facility] and was evaluated by heme-onc Dr.Ellis.  Patient was recommended for watchful waiting   10/02/2014 - 10/23/2014 Chemotherapy   -10/02/2014- 10/23/2014  4 weekly cycles of Rituxan  -07/23/2017 Decision was made to start Ibrutinib as patient is very symptomatic (fatigue and weight loss, lack of appetite). Patient did not show up at the chemo education class   05/08/2022 Imaging   CT CHEST, ABDOMEN, AND PELVIS WITH CONTRAST 1 unchanged enlarged bilateral axillary lymph nodes and prominent pretracheal mediastinal lymph nodes. 2. However,  when compared to most recent prior CT examination of the abdomen and pelvis dated 06/24/2016, there are numerous, extremely bulky lymph nodes throughout the abdomen and pelvis, which are greatly increased in size. Newly enlarged left inguinal lymph nodes, in keeping with reported palpable lymphadenopathy. 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 3. Mild right hydronephrosis; the proximal ureter appears compressed or obstructed by overlying lymphadenopathy. 4. Emphysema.5. Coronary artery disease.   05/30/2022 Imaging   PET scan showed  1. Again seen is massive nodal conglomeration within the abdomen with mildly diffuse increased tracer uptake with SUV max of 3.15.There also enlarged bilateral pelvic and left inguinal lymph nodes which exhibit mild increased tracer uptake compatible with residual/recurrent lymphoma. Imaging findings are compatible with residual/recurrent metabolically active low-grade lymphoma. 2. No significant tracer uptake associated with the borderline enlarged bilateral axillary lymph nodes.3. Unchanged appearance of small nonspecific subpleural nodules within the periphery of the upper lobes measuring up to 4 mm. These are too small to characterize by PET-CT.4. Aortic Atherosclerosis (ICD10-I70.0) and Emphysema (ICD10-J43.9). 5. Coronary artery calcifications. 6. Normal size spleen with background activity similar to the liver   06/22/2022 -  Chemotherapy   acalabrutinib 100 mg twice daily.   07/27/2022 - 08/20/2022 Chemotherapy   Patient is on Treatment Plan : ITP Rituximab q7d x 4 cycles     11/09/2022 Imaging   CT abdomen pelvis w contrast  1. Progressive bulky retroperitoneal and mesenteric lymphadenopathy consistent with progressive lymphoma. 2. No evidence of bowel obstruction, perforation or abscess. 3. Stable small amount of free pelvic fluid. 4. Aortic Atherosclerosis (ICD10-I70.0). Chronically occluded right femoral arterial stent  11/09/2022 -  Hospital  Admission   Patient presented to emergency room for evaluation of shortness of breath.  He has noticed progressive worsening of abdominal swelling and distention.  Associated with shortness of breath.  Patient was started on diuretics.     11/24/2022 Procedure   CT guided biopsy of abdomen mass  Pathology showed CLL/SLL Core biopsy sections demonstrate lymphoid tissue with partially effaced follicular architecture.  The bulk of the tissue consists of sheets of small lymphocytes with dense chromatin and scant cytoplasm.  There is no significant increase in larger prolymphocytic type cells.  Relatively few small germinal centers are appreciated.  Concurrent flow cytometric studies demonstrate a CD5+, CD23 + monoclonal B-cell population comprising 87% of total lymphoid cells and 100% of B cells.  The phenotype is compatible with chronic lymphocytic leukemia/small lymphocytic lymphoma.  There are no features in the current sample to suggest progression to higher grade process. Clinical and radiographic correlation is recommended.      ALLERGIES:  is allergic to penicillins and latex.  MEDICATIONS:  Current Outpatient Medications  Medication Sig Dispense Refill   aspirin EC 81 MG tablet Take 1 tablet (81 mg total) by mouth daily. Swallow whole. 90 tablet 3   dexamethasone (DECADRON) 4 MG tablet Take 1 tablet (4 mg total) by mouth daily. 6 tablet 0   hydrocortisone cream 1 % Apply 1 application. topically 2 (two) times daily. 30 g 0   montelukast (SINGULAIR) 10 MG tablet Take 1 tablet (10 mg total) by mouth See admin instructions. Take 1 tablet on the day of next appointment 1 tablet 0   pantoprazole (PROTONIX) 40 MG tablet Take 1 tablet (40 mg total) by mouth daily. Prior to breakfast, 30 min to 1 hr 30 tablet 3   rosuvastatin (CRESTOR) 10 MG tablet Take 1 tablet (10 mg total) by mouth daily. 90 tablet 3   tamsulosin (FLOMAX) 0.4 MG CAPS capsule Take 1 capsule (0.4 mg total) by mouth daily. Give 30  30 capsule 1   traMADol (ULTRAM) 50 MG tablet Take 1 tablet (50 mg total) by mouth every 6 (six) hours as needed. 30 tablet 0   traZODone (DESYREL) 50 MG tablet Take 0.5-1 tablets (25-50 mg total) by mouth at bedtime as needed for sleep. 30 tablet 3   zanubrutinib (BRUKINSA) 80 MG capsule Take 2 capsules (160 mg total) by mouth 2 (two) times daily. 120 capsule 0   No current facility-administered medications for this visit.    VITAL SIGNS: There were no vitals taken for this visit. There were no vitals filed for this visit.  Estimated body mass index is 17.79 kg/m as calculated from the following:   Height as of 12/02/22: '5\' 8"'$  (1.727 m).   Weight as of 12/23/22: 53.1 kg (117 lb).  LABS: CBC:    Component Value Date/Time   WBC 247.1 (HH) 12/23/2022 1307   HGB 7.0 (L) 12/23/2022 1307   HGB 13.0 03/13/2015 0852   HCT 26.0 (L) 12/23/2022 1307   HCT 40.9 03/13/2015 0852   PLT 178 12/23/2022 1307   PLT 217 03/13/2015 0852   MCV 87.5 12/23/2022 1307   MCV 78 (L) 03/13/2015 0852   NEUTROABS 1.5 (L) 12/23/2022 1307   NEUTROABS 3.5 03/13/2015 0852   LYMPHSABS 234.2 (H) 12/23/2022 1307   LYMPHSABS 3.9 (H) 03/13/2015 0852   MONOABS 11.2 (H) 12/23/2022 1307   MONOABS 0.4 03/13/2015 0852   EOSABS 0.1 12/23/2022 1307   EOSABS 0.4 03/13/2015 0852   BASOSABS  0.1 12/23/2022 1307   BASOSABS 0.1 03/13/2015 0852   Comprehensive Metabolic Panel:    Component Value Date/Time   NA 132 (L) 12/23/2022 1307   NA 138 10/16/2019 0000   NA 136 03/13/2015 0852   K 4.7 12/23/2022 1307   K 3.9 03/13/2015 0852   CL 99 12/23/2022 1307   CL 104 03/13/2015 0852   CO2 23 12/23/2022 1307   CO2 26 03/13/2015 0852   BUN 27 (H) 12/23/2022 1307   BUN 14 10/16/2019 0000   BUN 11 03/13/2015 0852   CREATININE 1.62 (H) 12/23/2022 1307   CREATININE 1.17 02/03/2019 1612   GLUCOSE 113 (H) 12/23/2022 1307   GLUCOSE 96 03/13/2015 0852   CALCIUM 8.7 (L) 12/23/2022 1307   CALCIUM 9.3 03/13/2015 0852   AST 27  12/23/2022 1307   AST 26 03/13/2015 0852   ALT 12 12/23/2022 1307   ALT 12 (L) 03/13/2015 0852   ALKPHOS 94 12/23/2022 1307   ALKPHOS 57 03/13/2015 0852   BILITOT 1.3 (H) 12/23/2022 1307   BILITOT 1.2 03/13/2015 0852   PROT 7.3 12/23/2022 1307   PROT 8.1 03/13/2015 0852   ALBUMIN 3.9 12/23/2022 1307   ALBUMIN 4.5 03/13/2015 0852     Present during today's visit: patient only, seen in infusion  Assessment and Plan: Reviewed CMP/CBC with patient, WBC/ALC elevated like lymphocytosis from treatment initiation. Patient understands that this should normalize with time Patient reports only taking 1 tablet twice daily, He should be taking 2 tablets twice daily. I asked him increase his dose and take it as prescribed Patient also mentioned stopping all of this other medications because he was not sure if he could take them with his zanubritinib. I instructed him to resume all of his other normal medication.     Oral Chemotherapy Side Effect/Intolerance:  No reported rash or diarrhea  Oral Chemotherapy Adherence: See above, but patient reports not missing any dose from how he was taking it No patient barriers to medication adherence identified.   New medications: none reported  Medication Access Issues: No issues, patient fills him medication at [facility] (Specialty)  Patient expressed understanding and was in agreement with this plan. He also understands that He can call clinic at any time with any questions, concerns, or complaints.   Follow-up plan: RTC in 3 weeks  Thank you for allowing me to participate in the care of this very pleasant patient.   Time Total: 15 mins  Visit consisted of counseling and education on dealing with issues of symptom management in the setting of serious and potentially life-threatening illness.Greater than 50%  of this time was spent counseling and coordinating care related to the above assessment and plan.  Signed by: Darl Pikes,  PharmD, BCPS, Salley Slaughter, CPP Hematology/Oncology Clinical Pharmacist Practitioner [place]/DB/AP [facility] [phone]  12/24/2022 1:22 PM

## 2022-12-24 NOTE — Patient Instructions (Signed)
Blood Transfusion, Adult A blood transfusion is a procedure in which you receive blood or a type of blood cell (blood component) through an IV. You may need a blood transfusion when you have a low blood count, which is a low number of any blood cell. This may result from a bleeding disorder, illness, injury, or surgery. The blood may come from a donor, or you may be able to have your own blood collected and stored (autologous blood donation) before a planned surgery. The blood given in a transfusion may be made up of different blood components. You may receive: Red blood cells. These carry oxygen to the cells in the body. Platelets. These help your blood to clot. Plasma. This is the liquid part of your blood. It carries proteins and other substances throughout the body. White blood cells. These help you fight infections. If you have hemophilia or another clotting disorder, you may also receive other types of blood products. Depending on the type of blood product, this procedure may take 1-4 hours to complete. Tell a health care provider about: Any bleeding problems you have. Any previous reactions you have had during a blood transfusion. Any allergies you have. All medicines you are taking, including vitamins, herbs, eye drops, creams, and over-the-counter medicines. Any surgeries you have had. Any medical conditions you have. Whether you are pregnant or may be pregnant. What are the risks? Talk with your health care provider about risks. The most common problems include: A mild allergic reaction, such as red, swollen areas of skin (hives) and itching. Fever or chills. This may be the body's response to new blood cells received. This may occur during or up to 4 hours after the transfusion. More serious problems may include: A serious allergic reaction that causes difficulty breathing or swelling around the face and lips. Transfusion-associated circulatory overload (TACO), or too much fluid in  the lungs. This may cause breathing problems. Transfusion-related acute lung injury (TRALI), which causes breathing difficulty and low oxygen in the blood. This can occur within hours of the transfusion or several days later. Iron overload. This can happen after receiving many blood transfusions over a period of time. Infection or virus being transmitted. This is rare because donated blood is carefully tested before it is given. Hemolytic transfusion reaction. This is rare. It happens when the body's defense system (immune system)tries to attack the new blood cells. Symptoms may include fever, chills, nausea, low blood pressure, and low back or chest pain. Transfusion-associated graft-versus-host disease (TAGVHD). This is rare. It happens when donated cells attack the body's healthy tissues. What happens before the procedure? You will have a blood test to check your blood type. This test is done to know what kind of blood your body will accept and to match it to the donor blood. If you are going to have a planned surgery, you may be able to do an autologous blood donation. This may be done in case you need to have a transfusion. You will have your temperature, blood pressure, and pulse checked before the transfusion. If you have had an allergic reaction to a transfusion in the past, you may be given medicine to help prevent a reaction. This medicine may be given to you by mouth (orally) or through an IV. What happens during the procedure?  An IV will be inserted into one of your veins. The bag of blood will be attached to your IV. The blood will then enter through your vein. Your temperature, blood pressure,   and pulse will be monitored during the transfusion. This monitoring is done to detect early signs of a transfusion reaction. Tell your nurse right away if you have any of these symptoms during the transfusion: Shortness of breath or trouble breathing. Chest or back pain. Fever or  chills. Itching or hives. If you have any signs or symptoms of a reaction, your transfusion will be stopped and you may be given medicine. When the transfusion is complete, your IV will be removed. Pressure may be applied to the IV site for a few minutes. A bandage (dressing)will be applied. The procedure may vary among health care providers and hospitals. What happens after the procedure? Your temperature, blood pressure, pulse, breathing rate, and blood oxygen level will be monitored until you leave the hospital or clinic. Your blood may be tested to see how you have responded to the transfusion. You may be warmed with fluids or blankets to maintain a normal body temperature. If you receive your blood transfusion in an outpatient setting, you will be told whom to contact to report any reactions. Where to find more information Visit the [employer]: redcross.org Summary A blood transfusion is a procedure in which you receive blood or a type of blood cell (blood component) through an IV. The blood given in a transfusion may be made up of different blood components. You may receive red blood cells, platelets, plasma, or white blood cells depending on the condition treated. Your temperature, blood pressure, and pulse will be monitored before, during, and after the transfusion. After the transfusion, your blood may be tested to see how your body has responded. This information is not intended to replace advice given to you by your health care provider. Make sure you discuss any questions you have with your health care provider. Document Revised: 03/06/2022 Document Reviewed: 03/06/2022 Elsevier Patient Education  2023 Elsevier Inc.  

## 2022-12-25 LAB — BPAM RBC
Blood Product Expiration Date: 202401162359
ISSUE DATE / TIME: 202401040938
Unit Type and Rh: 9500

## 2022-12-25 LAB — TYPE AND SCREEN
ABO/RH(D): A NEG
Antibody Screen: POSITIVE
Unit division: 0

## 2022-12-28 ENCOUNTER — Other Ambulatory Visit (HOSPITAL_COMMUNITY): Payer: Self-pay

## 2022-12-29 ENCOUNTER — Other Ambulatory Visit: Payer: Self-pay | Admitting: Oncology

## 2022-12-29 ENCOUNTER — Telehealth: Payer: Self-pay

## 2022-12-29 ENCOUNTER — Inpatient Hospital Stay: Payer: Medicare HMO

## 2022-12-29 DIAGNOSIS — C9110 Chronic lymphocytic leukemia of B-cell type not having achieved remission: Secondary | ICD-10-CM

## 2022-12-29 DIAGNOSIS — D649 Anemia, unspecified: Secondary | ICD-10-CM

## 2022-12-29 DIAGNOSIS — Z87891 Personal history of nicotine dependence: Secondary | ICD-10-CM | POA: Diagnosis not present

## 2022-12-29 LAB — PREPARE RBC (CROSSMATCH)

## 2022-12-29 LAB — CBC WITH DIFFERENTIAL/PLATELET
Abs Immature Granulocytes: 0.2 10*3/uL — ABNORMAL HIGH (ref 0.00–0.07)
Basophils Absolute: 0 10*3/uL (ref 0.0–0.1)
Basophils Relative: 0 %
Eosinophils Absolute: 0.1 10*3/uL (ref 0.0–0.5)
Eosinophils Relative: 0 %
HCT: 25 % — ABNORMAL LOW (ref 39.0–52.0)
Hemoglobin: 6.9 g/dL — ABNORMAL LOW (ref 13.0–17.0)
Immature Granulocytes: 0 %
Lymphocytes Relative: 97 %
Lymphs Abs: 171 10*3/uL — ABNORMAL HIGH (ref 0.7–4.0)
MCH: 24.7 pg — ABNORMAL LOW (ref 26.0–34.0)
MCHC: 27.6 g/dL — ABNORMAL LOW (ref 30.0–36.0)
MCV: 89.6 fL (ref 80.0–100.0)
Monocytes Absolute: 2.8 10*3/uL — ABNORMAL HIGH (ref 0.1–1.0)
Monocytes Relative: 2 %
Neutro Abs: 1.9 10*3/uL (ref 1.7–7.7)
Neutrophils Relative %: 1 %
Platelets: 235 10*3/uL (ref 150–400)
RBC: 2.79 MIL/uL — ABNORMAL LOW (ref 4.22–5.81)
RDW: 20.1 % — ABNORMAL HIGH (ref 11.5–15.5)
Smear Review: NORMAL
WBC: 176.1 10*3/uL (ref 4.0–10.5)
nRBC: 0 % (ref 0.0–0.2)

## 2022-12-29 NOTE — Telephone Encounter (Signed)
Pt informed. He will pick up AVS tomorrow after blood transfusion.

## 2022-12-29 NOTE — Telephone Encounter (Signed)
Pt will get blood transfusion tomorrow.   Per Dr. Tasia Catchings [place] message: please arrange him to repeat blood work cbc hold tube in 1 week, +/- D2 PRBC thanks.   Please inform pt of appt.

## 2022-12-30 ENCOUNTER — Other Ambulatory Visit: Payer: Medicare HMO

## 2022-12-30 ENCOUNTER — Inpatient Hospital Stay: Payer: Medicare HMO

## 2022-12-30 ENCOUNTER — Other Ambulatory Visit (HOSPITAL_COMMUNITY): Payer: Self-pay

## 2022-12-30 DIAGNOSIS — Z87891 Personal history of nicotine dependence: Secondary | ICD-10-CM | POA: Diagnosis not present

## 2022-12-30 DIAGNOSIS — C9110 Chronic lymphocytic leukemia of B-cell type not having achieved remission: Secondary | ICD-10-CM | POA: Diagnosis not present

## 2022-12-30 DIAGNOSIS — D649 Anemia, unspecified: Secondary | ICD-10-CM

## 2022-12-30 MED ORDER — ACETAMINOPHEN 325 MG PO TABS
650.0000 mg | ORAL_TABLET | Freq: Once | ORAL | Status: AC
  Administered 2022-12-30: 650 mg via ORAL
  Filled 2022-12-30: qty 2

## 2022-12-30 MED ORDER — SODIUM CHLORIDE 0.9% FLUSH
10.0000 mL | INTRAVENOUS | Status: DC | PRN
  Filled 2022-12-30: qty 10

## 2022-12-30 MED ORDER — DIPHENHYDRAMINE HCL 25 MG PO CAPS
25.0000 mg | ORAL_CAPSULE | Freq: Once | ORAL | Status: AC
  Administered 2022-12-30: 25 mg via ORAL
  Filled 2022-12-30: qty 1

## 2022-12-30 NOTE — Patient Instructions (Signed)

## 2022-12-31 DIAGNOSIS — H903 Sensorineural hearing loss, bilateral: Secondary | ICD-10-CM | POA: Diagnosis not present

## 2022-12-31 LAB — BPAM RBC
Blood Product Expiration Date: 202401232359
ISSUE DATE / TIME: 202401100926
Unit Type and Rh: 600

## 2022-12-31 LAB — TYPE AND SCREEN
ABO/RH(D): A NEG
Antibody Screen: POSITIVE
Unit division: 0

## 2023-01-01 ENCOUNTER — Inpatient Hospital Stay: Payer: Medicare HMO

## 2023-01-01 NOTE — Progress Notes (Signed)
Nutrition  Patient did not show up for scheduled in person nutrition appointment.  Message sent to scheduling to offer another appointment.  Message sent to provider as well.  Purcell Jungbluth B. Zenia Resides, [place], [place] Registered Dietitian [phone]

## 2023-01-05 ENCOUNTER — Inpatient Hospital Stay: Payer: Medicare HMO

## 2023-01-05 DIAGNOSIS — D649 Anemia, unspecified: Secondary | ICD-10-CM

## 2023-01-05 DIAGNOSIS — C9110 Chronic lymphocytic leukemia of B-cell type not having achieved remission: Secondary | ICD-10-CM | POA: Diagnosis not present

## 2023-01-05 DIAGNOSIS — Z87891 Personal history of nicotine dependence: Secondary | ICD-10-CM | POA: Diagnosis not present

## 2023-01-05 LAB — CBC WITH DIFFERENTIAL/PLATELET
Abs Immature Granulocytes: 0.22 10*3/uL — ABNORMAL HIGH (ref 0.00–0.07)
Basophils Absolute: 0 10*3/uL (ref 0.0–0.1)
Basophils Relative: 0 %
Eosinophils Absolute: 0 10*3/uL (ref 0.0–0.5)
Eosinophils Relative: 0 %
HCT: 27.9 % — ABNORMAL LOW (ref 39.0–52.0)
Hemoglobin: 7.9 g/dL — ABNORMAL LOW (ref 13.0–17.0)
Immature Granulocytes: 0 %
Lymphocytes Relative: 98 %
Lymphs Abs: 129.4 10*3/uL — ABNORMAL HIGH (ref 0.7–4.0)
MCH: 26.1 pg (ref 26.0–34.0)
MCHC: 28.3 g/dL — ABNORMAL LOW (ref 30.0–36.0)
MCV: 92.1 fL (ref 80.0–100.0)
Monocytes Absolute: 1.3 10*3/uL — ABNORMAL HIGH (ref 0.1–1.0)
Monocytes Relative: 1 %
Neutro Abs: 1.8 10*3/uL (ref 1.7–7.7)
Neutrophils Relative %: 1 %
Platelets: 281 10*3/uL (ref 150–400)
RBC: 3.03 MIL/uL — ABNORMAL LOW (ref 4.22–5.81)
RDW: 20.4 % — ABNORMAL HIGH (ref 11.5–15.5)
Smear Review: NORMAL
WBC: 132.8 10*3/uL (ref 4.0–10.5)
nRBC: 0 % (ref 0.0–0.2)

## 2023-01-05 LAB — SAMPLE TO BLOOD BANK

## 2023-01-06 ENCOUNTER — Inpatient Hospital Stay: Payer: Medicare HMO

## 2023-01-07 ENCOUNTER — Other Ambulatory Visit (HOSPITAL_COMMUNITY): Payer: Self-pay

## 2023-01-11 ENCOUNTER — Other Ambulatory Visit (HOSPITAL_COMMUNITY): Payer: Self-pay

## 2023-01-13 ENCOUNTER — Other Ambulatory Visit (HOSPITAL_COMMUNITY): Payer: Self-pay

## 2023-01-19 ENCOUNTER — Other Ambulatory Visit: Payer: Self-pay

## 2023-01-19 ENCOUNTER — Inpatient Hospital Stay (HOSPITAL_BASED_OUTPATIENT_CLINIC_OR_DEPARTMENT_OTHER): Payer: Medicare HMO | Admitting: Oncology

## 2023-01-19 ENCOUNTER — Inpatient Hospital Stay: Payer: Medicare HMO

## 2023-01-19 ENCOUNTER — Other Ambulatory Visit: Payer: Self-pay | Admitting: Oncology

## 2023-01-19 ENCOUNTER — Inpatient Hospital Stay: Payer: Medicare HMO | Admitting: Pharmacist

## 2023-01-19 ENCOUNTER — Other Ambulatory Visit (HOSPITAL_COMMUNITY): Payer: Self-pay

## 2023-01-19 ENCOUNTER — Encounter: Payer: Self-pay | Admitting: Oncology

## 2023-01-19 VITALS — BP 134/75 | HR 51 | Temp 97.9°F | Resp 18 | Wt 129.6 lb

## 2023-01-19 DIAGNOSIS — C9110 Chronic lymphocytic leukemia of B-cell type not having achieved remission: Secondary | ICD-10-CM

## 2023-01-19 DIAGNOSIS — Z5111 Encounter for antineoplastic chemotherapy: Secondary | ICD-10-CM

## 2023-01-19 DIAGNOSIS — Z87891 Personal history of nicotine dependence: Secondary | ICD-10-CM | POA: Diagnosis not present

## 2023-01-19 DIAGNOSIS — Z720 Tobacco use: Secondary | ICD-10-CM | POA: Diagnosis not present

## 2023-01-19 DIAGNOSIS — D649 Anemia, unspecified: Secondary | ICD-10-CM

## 2023-01-19 LAB — CBC WITH DIFFERENTIAL/PLATELET
Abs Immature Granulocytes: 0.39 10*3/uL — ABNORMAL HIGH (ref 0.00–0.07)
Basophils Absolute: 0 10*3/uL (ref 0.0–0.1)
Basophils Relative: 0 %
Eosinophils Absolute: 0.1 10*3/uL (ref 0.0–0.5)
Eosinophils Relative: 0 %
HCT: 28.2 % — ABNORMAL LOW (ref 39.0–52.0)
Hemoglobin: 7.9 g/dL — ABNORMAL LOW (ref 13.0–17.0)
Immature Granulocytes: 0 %
Lymphocytes Relative: 96 %
Lymphs Abs: 110.1 10*3/uL — ABNORMAL HIGH (ref 0.7–4.0)
MCH: 26.2 pg (ref 26.0–34.0)
MCHC: 28 g/dL — ABNORMAL LOW (ref 30.0–36.0)
MCV: 93.4 fL (ref 80.0–100.0)
Monocytes Absolute: 1.7 10*3/uL — ABNORMAL HIGH (ref 0.1–1.0)
Monocytes Relative: 2 %
Neutro Abs: 1.7 10*3/uL (ref 1.7–7.7)
Neutrophils Relative %: 2 %
Platelets: 246 10*3/uL (ref 150–400)
RBC: 3.02 MIL/uL — ABNORMAL LOW (ref 4.22–5.81)
RDW: 20.3 % — ABNORMAL HIGH (ref 11.5–15.5)
Smear Review: NORMAL
WBC Morphology: ABNORMAL
WBC: 114 10*3/uL (ref 4.0–10.5)
nRBC: 0 % (ref 0.0–0.2)

## 2023-01-19 LAB — COMPREHENSIVE METABOLIC PANEL
ALT: 12 U/L (ref 0–44)
AST: 18 U/L (ref 15–41)
Albumin: 3.9 g/dL (ref 3.5–5.0)
Alkaline Phosphatase: 59 U/L (ref 38–126)
Anion gap: 7 (ref 5–15)
BUN: 12 mg/dL (ref 8–23)
CO2: 24 mmol/L (ref 22–32)
Calcium: 8.9 mg/dL (ref 8.9–10.3)
Chloride: 106 mmol/L (ref 98–111)
Creatinine, Ser: 0.99 mg/dL (ref 0.61–1.24)
GFR, Estimated: >60 mL/min (ref >60)
Glucose, Bld: 105 mg/dL — ABNORMAL HIGH (ref 70–99)
Potassium: 4.1 mmol/L (ref 3.5–5.1)
Sodium: 137 mmol/L (ref 135–145)
Total Bilirubin: 0.6 mg/dL (ref 0.3–1.2)
Total Protein: 6.4 g/dL — ABNORMAL LOW (ref 6.5–8.1)

## 2023-01-19 LAB — SAMPLE TO BLOOD BANK

## 2023-01-19 LAB — LACTATE DEHYDROGENASE: LDH: 116 U/L (ref 98–192)

## 2023-01-19 MED ORDER — BRUKINSA 80 MG PO CAPS
160.0000 mg | ORAL_CAPSULE | Freq: Two times a day (BID) | ORAL | 1 refills | Status: DC
  Filled 2023-01-19: qty 120, 30d supply, fill #0
  Filled 2023-02-10: qty 120, 30d supply, fill #1

## 2023-01-19 NOTE — Assessment & Plan Note (Signed)
Recommend smoke cessation.  

## 2023-01-19 NOTE — Assessment & Plan Note (Addendum)
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 He prefers not to try reduced dose.  S/p Rituximab weekly x 4, CT shows disease progression, with bulky lymphadenopathy. Patient declines PET scan. Abdominal mass biopsy confirmed CLL, no evidence of transformation Labs reviewed and discussed with patient. Wbc is trending down Overall he tolerates Zanubrutinib 160 mg twice daily.  Recommend patient to continue current regimen.

## 2023-01-19 NOTE — Telephone Encounter (Signed)
CBC with Differential/Platelet Order: [id number] Status: Final result     Visible to patient: No (scheduled for 01/19/2023 11:33 AM)     Next appt: 01/26/2023 at 10:45 AM in Oncology [facility] Gillian Shields, RPH-CPP)     Dx: Chronic lymphocytic leukemia of B-cel...   0 Result Notes           Component Ref Range & Units 09:47 (01/19/23) 2 wk ago (01/05/23) 3 wk ago (12/29/22) 3 wk ago (12/23/22) 2 mo ago (11/18/22) 2 mo ago (11/18/22) 2 mo ago (11/09/22)  WBC 4.0 - 10.5 K/uL 114.0 High Panic  132.8 High Panic  CM 176.1 High Panic  CM 247.1 High Panic  CM 60.8 High Panic  CM 64.2 High Panic  CM 40.0 High   Comment: REPEATED TO VERIFY THIS CRITICAL RESULT HAS VERIFIED AND BEEN CALLED TO [facility]OS[facility] [place] BY KIM ROOS ON 01 30 2024 AT 1026, AND HAS BEEN READ BACK.  RBC 4.22 - 5.81 MIL/uL 3.02 Low  3.03 Low  2.79 Low  2.97 Low  3.46 Low  3.50 Low  3.55 Low   Hemoglobin 13.0 - 17.0 g/dL 7.9 Low  7.9 Low  6.9 Low  7.0 Low  8.8 Low  8.7 Low  9.1 Low   HCT 39.0 - 52.0 % 28.2 Low  27.9 Low  25.0 Low  26.0 Low  28.7 Low  29.3 Low  29.3 Low   MCV 80.0 - 100.0 fL 93.4 92.1 89.6 87.5 82.9 83.7 82.5  MCH 26.0 - 34.0 pg 26.2 26.1 24.7 Low  23.6 Low  25.4 Low  24.9 Low  25.6 Low   MCHC 30.0 - 36.0 g/dL 28.0 Low  28.3 Low  27.6 Low  26.9 Low  30.7 29.7 Low  31.1  RDW 11.5 - 15.5 % 20.3 High  20.4 High  20.1 High  20.9 High  16.5 High  16.3 High  15.9 High   Platelets 150 - 400 K/uL 246 281 235 178 144 Low  139 Low  CM 152  nRBC 0.0 - 0.2 % 0.0 0.0 0.0 0.0 0.0 0.0 0.1  Neutrophils Relative % % '2 1 1 1 3 2 4  '$ Neutro Abs 1.7 - 7.7 K/uL 1.7 1.8 1.9 1.5 Low  1.5 Low  1.5 Low  1.5 Low   Lymphocytes Relative % 96 98 97 94 69 77 71  Lymphs Abs 0.7 - 4.0 K/uL 110.1 High  129.4 High  171.0 High  234.2 High  41.8 High  48.6 High  28.6 High   Monocytes Relative % '2 1 2 5 28 21 23  '$ Monocytes Absolute 0.1 - 1.0 K/uL 1.7 High  1.3 High  2.8 High  11.2 High  17.0 High  13.5 High  9.4 High   Eosinophils Relative % 0 0 0 0 0 0 1   Eosinophils Absolute 0.0 - 0.5 K/uL 0.1 0.0 0.1 0.1 0.2 0.2 0.2  Basophils Relative % 0 0 0 0 0 0 0  Basophils Absolute 0.0 - 0.1 K/uL 0.0 0.0 0.0 0.1 0.1 0.2 High  0.2 High   WBC Morphology  Abnormal lymphocytes present DOHLE BODIES CM SMUDGE CELLS CM ABSOLUTE LYMPHOCYTOSIS CM SMUDGE CELLS CM Abnormal lymphocytes present ABSOLUTE LYMPHOCYTOSIS  Comment: ABSOLUTE LYMPHOCYTOSIS DIFF CONFIRMED BY MANUAL.  CONSISTANT WITH KNOWN CLL.  RBC Morphology  MIXED RBC POPULATION HIDE MORPHOLOGY UNREMARKABLE MORPHOLOGY UNREMARKABLE MORPHOLOGY UNREMARKABLE MORPHOLOGY UNREMARKABLE MORPHOLOGY UNREMARKABLE  Smear Review  Normal platelet morphology Normal platelet morphology CM Normal platelet morphology CM Normal platelet morphology CM  Normal platelet morphology CM Normal platelet morphology Normal platelet morphology  Comment: PLATELETS APPEAR ADEQUATE  Immature Granulocytes % 0 0 0 0 0 0 1  Abs Immature Granulocytes 0.00 - 0.07 K/uL 0.39 High  0.22 High  0.20 High  CM 0.08 High  CM 0.19 High  CM 0.17 High  CM 0.19 High  CM  Comment: Performed at [facility], [address] [place], [place] [address]  Dimorphism   PRESENT       Polychromasia   PRESENT CM       Resulting Agency  [place] [facility] [place] [facility] [place] [facility] [place] [facility] [place] [facility] [place] [facility] [place] [facility]         Specimen Collected: 01/19/23 09:47 Last Resulted: 01/19/23 10:33      Lab Flowsheet      Order Details      View Encounter      Lab and Collection Details      Routing      Result History    View All Conversations on this Encounter      CM=Additional comments      Result Care Coordination   Patient Communication   01/19/2023 11:33 AM Release Now   Not seen Back to Top      Other Results from 01/19/2023  Sample to Blood Bank Order: [id number] Status: In process      Visible to patient: No (not released)      Next appt: 01/26/2023 at 10:45 AM in Oncology [facility] Gillian Shields, RPH-CPP)      Dx: Chronic  lymphocytic leukemia of B-cel...    0 Result Notes       Specimen Collected: 01/19/23 09:47 Last Resulted: 01/19/23 10:02     Order Details       View Encounter       Lab and Collection Details       Routing       Result History     View All Conversations on this Encounter        Result Care Coordination   Patient Communication   Not Released  Not seen Back to Top        Lactate dehydrogenase Order: [id number] Status: Final result      Visible to patient: No (scheduled for 01/19/2023 11:25 AM)      Next appt: 01/26/2023 at 10:45 AM in Oncology Darl Pikes, RPH-CPP)      Dx: Chronic lymphocytic leukemia of B-cel...    0 Result Notes             Component Ref Range & Units 09:47 2 mo ago 6 mo ago 8 mo ago 11 mo ago 2 yr ago 5 yr ago  LDH 98 - 192 U/L 116 261 High  CM 135 CM 170 CM 150 CM 112 CM 121  Comment: Performed at [facility], [address] [place], [place] [address]  Resulting Agency  [facility] [place] [facility] [place] [facility] [place] [facility] [place] [facility] [place] [facility] [place] [facility]         Specimen Collected: 01/19/23 09:47 Last Resulted: 01/19/23 10:25      Lab Flowsheet       Order Details       View Encounter       Lab and Collection Details       Routing       Result History     View All  Conversations on this Encounter      CM=Additional comments      Result Care Coordination   Patient Communication   01/19/2023 11:25 AM Release Now   Not seen Back to Top         Contains abnormal data Comprehensive metabolic panel Order: [id number] Status: Final result      Visible to patient: No (scheduled for 01/19/2023 11:25 AM)      Next appt: 01/26/2023 at 10:45 AM in Oncology Darl Pikes, RPH-CPP)      Dx: Chronic lymphocytic leukemia of B-cel...    0 Result Notes             Component Ref Range & Units 09:47 (01/19/23) 3 wk ago (12/23/22) 2 mo ago (11/18/22) 2 mo ago (11/18/22) 2 mo ago (11/09/22) 5 mo  ago (08/20/22) 5 mo ago (08/13/22)  Sodium 135 - 145 mmol/L 137 132 Low  139 146 High  143 137 139  Potassium 3.5 - 5.1 mmol/L 4.1 4.7 4.5 4.8 4.1 4.2 4.4  Chloride 98 - 111 mmol/L 106 99 109 114 High  115 High  104 109  CO2 22 - 32 mmol/L '24 23 22 25 23 26 25  '$ Glucose, Bld 70 - 99 mg/dL 105 High  113 High  CM 90 CM 89 CM 119 High  CM 96 CM 110 High  CM  Comment: Glucose reference range applies only to samples taken after fasting for at least 8 hours.  BUN 8 - 23 mg/dL 12 27 High  '22 21 21 17 16  '$ Creatinine, Ser 0.61 - 1.24 mg/dL 0.99 1.62 High  1.81 High  1.82 High  1.51 High  1.33 High  1.33 High   Calcium 8.9 - 10.3 mg/dL 8.9 8.7 Low  9.0 9.0 9.1 9.0 9.0  Total Protein 6.5 - 8.1 g/dL 6.4 Low  7.3 6.6  6.1 Low  6.7 6.9  Albumin 3.5 - 5.0 g/dL 3.9 3.9 4.1  3.8 3.9 4.0  AST 15 - 41 U/L '18 27 25  27 17 17  '$ ALT 0 - 44 U/L '12 12 14  13 12 10  '$ Alkaline Phosphatase 38 - 126 U/L 59 94 87  75 53 54  Total Bilirubin 0.3 - 1.2 mg/dL 0.6 1.3 High  1.0  1.2 0.7 1.0  GFR, Estimated >60 mL/min >60 46 Low  CM 40 Low  CM 40 Low  CM 50 Low  CM 58 Low  CM 58 Low  CM  Comment: (NOTE) Calculated using the CKD-EPI Creatinine Equation (2021)  Anion gap 5 - '15 7 10 '$ CM 8 CM 7 CM 5 CM 7 CM 5 CM  Comment: Performed at [facility], [address] [place], [place] [address]  Resulting Agency  [facility] [place] [facility] [place] [facility] [place] [facility] [place] [facility] [place] [facility] [place] [facility]         Specimen Collected: 01/19/23 09:47 Last Resulted: 01/19/23 10:25

## 2023-01-19 NOTE — Assessment & Plan Note (Signed)
Secondary to CLL with bone marrow involvement Hb has improved to 7.9  Close monitor Q2weeks

## 2023-01-19 NOTE — Progress Notes (Signed)
PT here for follow up. Reports that he is tolerating Brukinsa well.

## 2023-01-19 NOTE — Progress Notes (Signed)
Hematology/Oncology Progress note [phone] [phone]   Chief Complaint: [name] is a 72 y.o. male presents to follow up for chronic lymphocytic lymphoma.   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 He prefers not to try reduced dose.  S/p Rituximab weekly x 4, CT shows disease progression, with bulky lymphadenopathy. Patient declines PET scan. Abdominal mass biopsy confirmed CLL, no evidence of transformation Labs reviewed and discussed with patient. Wbc is trending down Overall he tolerates Zanubrutinib 160 mg twice daily.  Recommend patient to continue current regimen.   Encounter for antineoplastic chemotherapy CLL treatment as listed above  Normocytic anemia Secondary to CLL with bone marrow involvement Hb has improved to 7.9  Close monitor Q2weeks  Tobacco abuse Recommend smoke cessation.    Follow-up  Lab in 2 weeks, 4 weeks, -cbc hold tube  6 weeks lab MD    All questions were answered. The patient knows to call the clinic with any problems, questions or concerns.  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  He was treated with prednisone. 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  -Oct 2020 second opinion at [facility] and was evaluated by heme-onc Dr.Ellis.  Patient was recommended for watchful waiting   10/02/2014 - 10/23/2014 Chemotherapy   -10/02/2014- 10/23/2014  4 weekly cycles of Rituxan  -07/23/2017 Decision was made to start Ibrutinib as patient is very symptomatic (fatigue and weight loss, lack of appetite). Patient did not show up at the chemo education class   05/08/2022 Imaging   CT CHEST, ABDOMEN, AND PELVIS WITH CONTRAST 1 unchanged enlarged bilateral axillary lymph nodes and prominent pretracheal mediastinal lymph nodes. 2. However, when compared to most recent prior CT examination of the abdomen and pelvis dated 06/24/2016, there are numerous, extremely bulky lymph nodes throughout the abdomen and pelvis, which are greatly increased in size. Newly enlarged left inguinal lymph nodes, in keeping with reported palpable lymphadenopathy. 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 3. Mild right hydronephrosis; the proximal ureter appears compressed or obstructed by overlying lymphadenopathy. 4. Emphysema.5. Coronary artery disease.   05/30/2022 Imaging   PET scan showed  1. Again seen is massive nodal conglomeration within the abdomen with mildly diffuse increased tracer uptake with SUV max of 3.15.There also enlarged bilateral pelvic and left inguinal lymph nodes which exhibit mild increased tracer uptake compatible with residual/recurrent lymphoma. Imaging findings are compatible with residual/recurrent metabolically active low-grade lymphoma. 2. No significant tracer uptake associated with the borderline enlarged bilateral axillary lymph nodes.3. Unchanged appearance of small nonspecific subpleural nodules within the periphery of the upper lobes measuring up to 4 mm. These are too small to characterize by PET-CT.4. Aortic Atherosclerosis (ICD10-I70.0)  and Emphysema (ICD10-J43.9). 5. Coronary artery calcifications. 6. Normal  size spleen with background activity similar to the liver   06/22/2022 -  Chemotherapy   acalabrutinib 100 mg twice daily.   07/27/2022 - 08/20/2022 Chemotherapy   Patient is on Treatment Plan : ITP Rituximab q7d x 4 cycles     11/09/2022 Imaging   CT abdomen pelvis w contrast  1. Progressive bulky retroperitoneal and mesenteric lymphadenopathy consistent with progressive lymphoma. 2. No evidence of bowel obstruction, perforation or abscess. 3. Stable small amount of free pelvic fluid. 4. Aortic Atherosclerosis (ICD10-I70.0). Chronically occluded right femoral arterial stent    11/09/2022 [facility] Admission   Patient presented to emergency room for evaluation of shortness of breath.  He has noticed progressive worsening of abdominal swelling and distention.  Associated with shortness of breath.  Patient was started on diuretics.     11/24/2022 Procedure   CT guided biopsy of abdomen mass  Pathology showed CLL/SLL Core biopsy sections demonstrate lymphoid tissue with partially effaced follicular architecture.  The bulk of the tissue consists of sheets of small lymphocytes with dense chromatin and scant cytoplasm.  There is no significant increase in larger prolymphocytic type cells.  Relatively few small germinal centers are appreciated.  Concurrent flow cytometric studies demonstrate a CD5+, CD23 + monoclonal B-cell population comprising 87% of total lymphoid cells and 100% of B cells.  The phenotype is compatible with chronic lymphocytic leukemia/small lymphocytic lymphoma.  There are no features in the current sample to suggest progression to higher grade process. Clinical and radiographic correlation is recommended.    12/09/2022 -  Chemotherapy   Zanubrutinib 160 mg twice daily     INTERVAL HISTORY 72 y.o. male with history of CLL presents for follow-up. Patient has been on Zanubrutinib 160 mg twice daily  since He has gained weight since last visit. Denies any nausea vomiting diarrhea. Abdominal pressure/discomfort has decreased.      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 Tasia Catchings   Hyperlipidemia    Low back pain    Peripheral vascular disease [facility])     Past Surgical History:  Procedure Laterality Date   COLONOSCOPY     COLONOSCOPY WITH PROPOFOL N/A 07/23/2015   Procedure: COLONOSCOPY WITH PROPOFOL;  Surgeon: [name], MD;  Location: [facility] ENDOSCOPY;  Service: Endoscopy;  Laterality: N/A;   COLONOSCOPY WITH PROPOFOL N/A 04/20/2022   Procedure: COLONOSCOPY WITH PROPOFOL;  Surgeon: Jonathon Bellows, MD;  Location: [facility] ENDOSCOPY;  Service: Gastroenterology;  Laterality: N/A;   ESOPHAGOGASTRODUODENOSCOPY (EGD) WITH PROPOFOL N/A 08/20/2015   Procedure: ESOPHAGOGASTRODUODENOSCOPY (EGD) WITH PROPOFOL;  Surgeon: [name], MD;  Location: [facility] ENDOSCOPY;  Service: Endoscopy;  Laterality: N/A;   LOWER EXTREMITY ANGIOGRAPHY Left 05/19/2018   Procedure: LOWER EXTREMITY ANGIOGRAPHY;  Surgeon: Algernon Huxley, MD;  Location: [place] CV LAB;  Service: Cardiovascular;  Laterality: Left;   LOWER EXTREMITY ANGIOGRAPHY Right 06/15/2018   Procedure: LOWER EXTREMITY ANGIOGRAPHY;  Surgeon: Algernon Huxley, MD;  Location: [place] CV LAB;  Service: Cardiovascular;  Laterality: Right;   lymp node removal Right 2011   arm   left arm 2015   LYMPH NODE BIOPSY  08/2014    Family History  Problem Relation Age of Onset   Heart attack Father    Kidney cancer Neg Hx        lung cancer   Bladder Cancer Neg Hx    Prostate  cancer Neg Hx     Social History:  reports that he quit smoking about 5 months ago. His smoking use included cigars. He has never used smokeless tobacco. He reports that he does not drink alcohol and does not use drugs.  Allergies:  Allergies  Allergen Reactions    Penicillins     Other reaction(s): Rash, Hives ()   Latex Rash   Review of Systems:  Review of Systems  Constitutional:  Negative for chills, fever, malaise/fatigue and weight loss.  HENT:  Negative for ear discharge, ear pain, nosebleeds and sore throat.   Eyes:  Negative for photophobia, pain and redness.  Respiratory:  Negative for cough, hemoptysis, sputum production, shortness of breath and wheezing.   Cardiovascular:  Negative for chest pain, palpitations and leg swelling.  Gastrointestinal:  Negative for abdominal pain, blood in stool, constipation, diarrhea, heartburn, nausea and vomiting.  Genitourinary:  Negative for dysuria and frequency.  Musculoskeletal:  Negative for myalgias and neck pain.  Skin:  Negative for rash.  Neurological:  Negative for dizziness, tingling, tremors, weakness and headaches.  Endo/Heme/Allergies:  Does not bruise/bleed easily.  Psychiatric/Behavioral:  Negative for depression, hallucinations and suicidal ideas.      Current Outpatient Medications on File Prior to Visit  Medication Sig Dispense Refill   aspirin EC 81 MG tablet Take 1 tablet (81 mg total) by mouth daily. Swallow whole. 90 tablet 3   dexamethasone (DECADRON) 4 MG tablet Take 1 tablet (4 mg total) by mouth daily. 6 tablet 0   hydrocortisone cream 1 % Apply 1 application. topically 2 (two) times daily. 30 g 0   montelukast (SINGULAIR) 10 MG tablet Take 1 tablet (10 mg total) by mouth See admin instructions. Take 1 tablet on the day of next appointment 1 tablet 0   pantoprazole (PROTONIX) 40 MG tablet Take 1 tablet (40 mg total) by mouth daily. Prior to breakfast, 30 min to 1 hr 30 tablet 3   rosuvastatin (CRESTOR) 10 MG tablet Take 1 tablet (10 mg total) by mouth daily. 90 tablet 3   tamsulosin (FLOMAX) 0.4 MG CAPS capsule Take 1 capsule (0.4 mg total) by mouth daily. Give 30 30 capsule 1   traMADol (ULTRAM) 50 MG tablet Take 1 tablet (50 mg total) by mouth every 6 (six) hours as needed.  30 tablet 0   traZODone (DESYREL) 50 MG tablet Take 0.5-1 tablets (25-50 mg total) by mouth at bedtime as needed for sleep. 30 tablet 3   No current facility-administered medications on file prior to visit.    Physical Exam: Blood pressure 134/75, pulse (!) 51, temperature 97.9 F (36.6 C), resp. rate 18, weight 129 lb 9.6 oz (58.8 kg), SpO2 100 %. Physical Exam Constitutional:      General: He is not in acute distress.    Appearance: He is not diaphoretic.  HENT:     Head: Normocephalic.     Nose: Nose normal.     Mouth/Throat:     Pharynx: No oropharyngeal exudate.  Eyes:     General: No scleral icterus.    Pupils: Pupils are equal, round, and reactive to light.  Cardiovascular:     Rate and Rhythm: Normal rate and regular rhythm.  Pulmonary:     Effort: Pulmonary effort is normal. No respiratory distress.     Breath sounds: No wheezing.  Abdominal:     General: Bowel sounds are normal. There is no distension.     Palpations: Abdomen is soft.  Tenderness: There is no abdominal tenderness.     Comments: Abdominal distention/mass has decreased  Genitourinary:    Comments: palpable left inguinal lymph node, tender, decreased in size.  Musculoskeletal:        General: Normal range of motion.     Cervical back: Normal range of motion.  Skin:    General: Skin is warm and dry.     Findings: No erythema.  Neurological:     Mental Status: He is alert and oriented to person, place, and time. Mental status is at baseline.     Cranial Nerves: No cranial nerve deficit.     Motor: No abnormal muscle tone.  Psychiatric:        Mood and Affect: Mood and affect normal.     LABORATORY RESULTS.    Latest Ref Rng & Units 01/19/2023    9:47 AM 01/05/2023    3:08 PM 12/29/2022   10:23 AM  CBC  WBC 4.0 - 10.5 K/uL 114.0  132.8  176.1   Hemoglobin 13.0 - 17.0 g/dL 7.9  7.9  6.9   Hematocrit 39.0 - 52.0 % 28.2  27.9  25.0   Platelets 150 - 400 K/uL 246  281  235       Latest Ref  Rng & Units 01/19/2023    9:47 AM 12/23/2022    1:07 PM 11/18/2022    1:02 PM  CMP  Glucose 70 - 99 mg/dL 105  113  90   BUN 8 - 23 mg/dL '12  27  22   '$ Creatinine 0.61 - 1.24 mg/dL 0.99  1.62  1.81   Sodium 135 - 145 mmol/L 137  132  139   Potassium 3.5 - 5.1 mmol/L 4.1  4.7  4.5   Chloride 98 - 111 mmol/L 106  99  109   CO2 22 - 32 mmol/L '24  23  22   '$ Calcium 8.9 - 10.3 mg/dL 8.9  8.7  9.0   Total Protein 6.5 - 8.1 g/dL 6.4  7.3  6.6   Total Bilirubin 0.3 - 1.2 mg/dL 0.6  1.3  1.0   Alkaline Phos 38 - 126 U/L 59  94  87   AST 15 - 41 U/L '18  27  25   '$ ALT 0 - 44 U/L '12  12  14      '$ PATHOLOGY:  11/02/2016 Peripheral blood FISH studies revealed 20% of nuclei positive for loss of 1 ATM signal, 63% of nuclei positive for trisomy 12, and 32% of nuclei positive for loss of 1 TP53 signal. Results for CCND1/IGH and 13q were normal.  09/13/2014 Left axillary node biopsy on  confirmed B-cell small lymphocytic lymphoma (B-CLL/SLL)   IMAGE STUDIES CT ABDOMINAL MASS BIOPSY  Result Date: 11/24/2022 INDICATION: Chronic lymphocytic leukemia EXAM: CT-guided core needle biopsy of abdominal mass MEDICATIONS: None. ANESTHESIA/SEDATION: Moderate (conscious) sedation was employed during this procedure. A total of Versed 1 mg and Fentanyl 50 mcg was administered intravenously. Moderate Sedation Time: 12 minutes. The patient's level of consciousness and vital signs were monitored continuously by radiology nursing throughout the procedure under my direct supervision. FLUOROSCOPY TIME:  N/a COMPLICATIONS: None immediate. PROCEDURE: Informed written consent was obtained from the patient after a thorough discussion of the procedural risks, benefits and alternatives. All questions were addressed. Maximal Sterile Barrier Technique was utilized including caps, mask, sterile gowns, sterile gloves, sterile drape, hand hygiene and skin antiseptic. A timeout was performed prior to the initiation of the procedure. The patient  was placed supine on the exam table. Limited CT of the abdomen and pelvis was performed for planning purposes. This demonstrated large abdominal mass. Skin entry site was marked, and the overlying skin was prepped and draped in the standard sterile fashion. Local analgesia was obtained with 1% lidocaine. Using intermittent CT fluoroscopy, a 17 gauge introducer needle was advanced towards the identified lesion. Subsequently, core needle biopsy was performed using an 18 gauge core biopsy device x6 total passes. Specimens were submitted in saline to pathology for further handling. Limited postprocedure imaging demonstrated no complicating feature. The patient tolerated the procedure well, and was transferred to recovery in stable condition. IMPRESSION: Successful CT-guided core needle biopsy of large abdominal mass. Electronically Signed   By: Albin Felling M.D.   On: 11/24/2022 14:07   CT ABDOMEN PELVIS W CONTRAST  Result Date: 11/09/2022 CLINICAL DATA:  Abdominal pain, acute, nonlocalized. Shortness of breath with abdominal pain, bloating, nausea, vomiting and diarrhea. History of chronic lymphocytic leukemia. * Tracking Code: BO * EXAM: CT ABDOMEN AND PELVIS WITH CONTRAST TECHNIQUE: Multidetector CT imaging of the abdomen and pelvis was performed using the standard protocol following bolus administration of intravenous contrast. RADIATION DOSE REDUCTION: This exam was performed according to the departmental dose-optimization program which includes automated exposure control, adjustment of the mA and/or kV according to patient size and/or use of iterative reconstruction technique. CONTRAST:  35m OMNIPAQUE IOHEXOL 300 MG/ML  SOLN COMPARISON:  Abdominopelvic CT 07/11/2022 and 05/08/2022. PET-CT 05/28/2022. FINDINGS: Lower chest: Clear lung bases. No significant pleural or pericardial effusion. Coronary artery atherosclerosis noted. Partially imaged right infrahilar lymph node measuring 9 mm on image 1/2.  Increasing right retrocrural adenopathy, measuring up to 1.4 cm short axis on image 18/2. Hepatobiliary: The liver is normal in density without suspicious focal abnormality. No evidence of gallstones, gallbladder wall thickening or biliary dilatation. Pancreas: Anteriorly and superiorly displaced by progressive extensive retroperitoneal adenopathy. No pancreatic ductal dilatation or surrounding inflammation. Spleen: Normal in size without focal abnormality. Adrenals/Urinary Tract: Both adrenal glands appear normal. No evidence of urinary tract calculus, suspicious renal lesion or hydronephrosis. The bladder appears unremarkable for its degree of distention. Stomach/Bowel: No enteric contrast administered. The stomach is anteriorly and superiorly displaced by progressive retroperitoneal and mesenteric lymphadenopathy. No evidence of bowel wall thickening, distention or surrounding inflammation. The appendix appears normal. Vascular/Lymphatic: Bulky retroperitoneal and mesenteric lymphadenopathy has progressed compared with the most recent CT. Conglomerate nodes in the gastrohepatic ligament measure up to 12.9 x 10.1 cm on image 20/2 (previously approximately 7.3 cm maximally). At the level of the iliac crest, there is a conglomerate of nodes surrounding the mesenteric vessels which measures up to 16.9 x 11.7 cm on image 49/2. Small pelvic and inguinal lymph nodes have mildly increased in size and number. Diffuse aortic and branch vessel atherosclerosis without evidence of aneurysm. Right femoral artery stent appears chronically occluded. There is extrinsic compression of the IVC and renal veins by the lymphadenopathy, but no evidence of large vessel occlusion. Reproductive: Stable calcifications within the prostate gland. Other: Small amount of free pelvic fluid appears similar to previous study. Small umbilical hernia containing only fat. No free air. Musculoskeletal: No acute or significant osseous findings.  Chronic degenerative disc disease at L5-S1 with associated discogenic sclerosis. IMPRESSION: 1. Progressive bulky retroperitoneal and mesenteric lymphadenopathy consistent with progressive lymphoma. 2. No evidence of bowel obstruction, perforation or abscess. 3. Stable small amount of free pelvic fluid. 4. Aortic Atherosclerosis (ICD10-I70.0). Chronically occluded right femoral arterial stent. Electronically  Signed   By: Richardean Sale M.D.   On: 11/09/2022 18:58   US Abdomen Limited RUQ (LIVER/GB)  Result Date: 11/09/2022 CLINICAL DATA:  [id number] Right upper quadrant pain [id number] EXAM: ULTRASOUND ABDOMEN LIMITED RIGHT UPPER QUADRANT COMPARISON:  CT abdomen pelvis 07/11/2022 FINDINGS: Gallbladder: Calcified gallstones within the gallbladder lumen. Non mobile hyperechoic foci along the gallbladder wall measuring up to 6 mm. No gallbladder wall thickening or pericholecystic fluid visualized. No sonographic Murphy sign noted by sonographer. Common bile duct: Diameter: 4 mm Liver: No focal lesion identified. Within normal limits in parenchymal echogenicity. Portal vein is patent on color Doppler imaging with normal direction of blood flow towards the liver. Other: Heterogeneous perigastric mass poorly visualized. IMPRESSION: 1. Cholelithiasis with no of acute cholecystitis. 2. Non-mobile hyperechoic foci of 6 mm along the gallbladder wall that may represent a gallbladder polyp. Recommend follow-up ultrasound in 6 months to evaluate stability in size. 3. Heterogeneous perigastric mass poorly visualized. Finding may correlate to finding of bulky adenopathy noted on CT abdomen pelvis 07/11/2022. Electronically Signed   By: Iven Finn M.D.   On: 11/09/2022 15:46   DG Chest 2 View  Result Date: 11/09/2022 CLINICAL DATA:  Two-week history of shortness of breath, abdominal pain, and bloating EXAM: CHEST - 2 VIEW COMPARISON:  Chest radiograph dated 07/12/2019 FINDINGS: Low lung volumes with bronchovascular  crowding. No focal consolidations. No pleural effusion or pneumothorax. The heart size and mediastinal contours are within normal limits. The visualized skeletal structures are unremarkable. Right axillary surgical clips. IMPRESSION: Low lung volumes with bronchovascular crowding. No focal consolidations. Electronically Signed   By: Darrin Nipper M.D.   On: 11/09/2022 14:48

## 2023-01-19 NOTE — Assessment & Plan Note (Signed)
CLL treatment as listed above

## 2023-01-26 ENCOUNTER — Inpatient Hospital Stay: Payer: Medicare HMO | Admitting: Pharmacist

## 2023-02-02 ENCOUNTER — Inpatient Hospital Stay: Payer: Medicare HMO | Attending: Oncology

## 2023-02-03 ENCOUNTER — Telehealth: Payer: Self-pay | Admitting: *Deleted

## 2023-02-03 NOTE — Telephone Encounter (Signed)
Patient called requesting a letter from Dr Tasia Catchings addressed to "Whom it may Concern" stating his diagnosis to sent to [facility] [place]. Call him when ready and he will pick it up

## 2023-02-04 NOTE — Telephone Encounter (Signed)
Letter has been made and pt notified that it will be in registration for him to pick up.

## 2023-02-09 ENCOUNTER — Ambulatory Visit: Payer: Medicare HMO | Admitting: Family

## 2023-02-10 ENCOUNTER — Other Ambulatory Visit (HOSPITAL_COMMUNITY): Payer: Self-pay

## 2023-02-15 ENCOUNTER — Other Ambulatory Visit: Payer: Self-pay

## 2023-02-16 ENCOUNTER — Inpatient Hospital Stay: Payer: Medicare HMO

## 2023-02-17 ENCOUNTER — Ambulatory Visit: Payer: Medicare HMO | Admitting: Family

## 2023-03-02 ENCOUNTER — Ambulatory Visit: Payer: Medicare HMO | Admitting: Student in an Organized Health Care Education/Training Program

## 2023-03-02 ENCOUNTER — Encounter: Payer: Self-pay | Admitting: Student in an Organized Health Care Education/Training Program

## 2023-03-02 VITALS — BP 124/70 | HR 53 | Temp 98.0°F | Ht 68.0 in | Wt 137.6 lb

## 2023-03-02 DIAGNOSIS — J432 Centrilobular emphysema: Secondary | ICD-10-CM | POA: Diagnosis not present

## 2023-03-02 DIAGNOSIS — R0602 Shortness of breath: Secondary | ICD-10-CM

## 2023-03-02 DIAGNOSIS — D561 Beta thalassemia: Secondary | ICD-10-CM | POA: Diagnosis not present

## 2023-03-02 MED ORDER — UMECLIDINIUM-VILANTEROL 62.5-25 MCG/ACT IN AEPB: 1.0000 | INHALATION_SPRAY | Freq: Every day | RESPIRATORY_TRACT | 11 refills | Status: DC

## 2023-03-02 NOTE — Progress Notes (Signed)
Assessment & Plan:   1. Shortness of breath 2. Centrilobular emphysema ([place]) 3. CLL 4. Beta-thalassemia    The patient is presenting for follow up on shortness of breath. Workup so far in the emergency department has included a CT scan of his abdomen notable for bulky lymphadenopathy and blood work notable for anemia. He's restarted treatment for CLL with notable improvement, thou he continues to experience dyspnea and a cough.   The differential for his shortness of breath includes anemia, COPD, and compressive mediastinal lymphadenopathy. Review of his imaging is notable for a CT scan from May 2023 that shows mild centrilobular and paraseptal emphysema. Given persistence of symptoms (albeit improved), I will proceed with ordering a pulmonary function test and starting the patient on a combination of LABA/LAMA therapy for mangement of presumed COPD. He will follow closely with Dr. Tasia Catchings for management of his CLL as well as anemia. He will have blood work drawn today which I will also follow.  - Pulmonary Function Test [facility] Only; Future - umeclidinium-vilanterol (ANORO ELLIPTA) 62.5-25 MCG/ACT AEPB; Inhale 1 puff into the lungs daily.  Dispense: 30 each; Refill: 11   Return in about 6 months (around 09/02/2023).  I spent 30 minutes caring for this patient today, including preparing to see the patient, obtaining a medical history , reviewing a separately obtained history, performing a medically appropriate examination and/or evaluation, counseling and educating the patient/family/caregiver, ordering medications, tests, or procedures, and documenting clinical information in the electronic health record  Armando Reichert, MD [facility] 03/02/2023 11:05 AM    End of visit medications:  Meds ordered this encounter  Medications   umeclidinium-vilanterol (ANORO ELLIPTA) 62.5-25 MCG/ACT AEPB    Sig: Inhale 1 puff into the lungs daily.    Dispense:  30 each    Refill:  11      Current Outpatient Medications:    aspirin EC 81 MG tablet, Take 1 tablet (81 mg total) by mouth daily. Swallow whole., Disp: 90 tablet, Rfl: 3   dexamethasone (DECADRON) 4 MG tablet, Take 1 tablet (4 mg total) by mouth daily., Disp: 6 tablet, Rfl: 0   hydrocortisone cream 1 %, Apply 1 application. topically 2 (two) times daily., Disp: 30 g, Rfl: 0   montelukast (SINGULAIR) 10 MG tablet, Take 1 tablet (10 mg total) by mouth See admin instructions. Take 1 tablet on the day of next appointment, Disp: 1 tablet, Rfl: 0   pantoprazole (PROTONIX) 40 MG tablet, Take 1 tablet (40 mg total) by mouth daily. Prior to breakfast, 30 min to 1 hr, Disp: 30 tablet, Rfl: 3   rosuvastatin (CRESTOR) 10 MG tablet, Take 1 tablet (10 mg total) by mouth daily., Disp: 90 tablet, Rfl: 3   tamsulosin (FLOMAX) 0.4 MG CAPS capsule, Take 1 capsule (0.4 mg total) by mouth daily. Give 30, Disp: 30 capsule, Rfl: 1   traMADol (ULTRAM) 50 MG tablet, Take 1 tablet (50 mg total) by mouth every 6 (six) hours as needed., Disp: 30 tablet, Rfl: 0   traZODone (DESYREL) 50 MG tablet, Take 0.5-1 tablets (25-50 mg total) by mouth at bedtime as needed for sleep., Disp: 30 tablet, Rfl: 3   umeclidinium-vilanterol (ANORO ELLIPTA) 62.5-25 MCG/ACT AEPB, Inhale 1 puff into the lungs daily., Disp: 30 each, Rfl: 11   zanubrutinib (BRUKINSA) 80 MG capsule, Take 2 capsules (160 mg total) by mouth 2 (two) times daily., Disp: 120 capsule, Rfl: 1   Subjective:   PATIENT ID: [name] GENDER: male DOB:  [birth year], MRN: [id number]  Chief Complaint  Patient presents with   Follow-up    SOB with exertion, prod cough with clear sputum and wheezing.     HPI  [name] is a pleasant 72 year old male patient with a history of CLL who presents to clinic for follow up of shortness of breath.   Patient reports some improvement in his symptoms. Since our last visits he's been followed closely by Dr. Tasia Catchings for CLL. He is s/p Rituximab with improvement  and is currently on Zanubrutinib. He's also had anemia for which he's been closely monitored and has received multiple blood infusions. He reports some exertional dyspnea as well as a cough that is productive of whitish sputum. While the infusions had helped with his shortness of breath, the last one did not help much. He is going to see Dr. Tasia Catchings today for follow up.   He was seen in the emergency department on 11/09/2022 for his lower extremity edema where a CT scan of the abdomen was notable for increased lymphadenopathy consistent with progressive CLL, a diagnosis he carries. He was given Lasix which she has been using daily with mild improvement. The lower extremity edema is significantly improved. He underwent repeat biopsy 11/24/2022. He was diagnosed with CLL in 2015 after initially presenting with hemolytic anemia, status posttreatment with steroids and rituximab. He also has a history of but the thalassemia minor with chronic microcytic anemia.   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  He reports distant history of smoking cigarettes but quit many many years ago. He has stopped smoking cigars. He reports occasional MJ use that helps with his mood and appetite.  Ancillary information including prior medications, full medical/surgical/family/social histories, and PFTs (when available) are listed below and have been reviewed.   Review of Systems  Constitutional:  Negative for chills, fever, malaise/fatigue and weight loss.  Respiratory:  Positive for cough and shortness of breath. Negative for hemoptysis, sputum production and wheezing.   Cardiovascular:  Negative for chest pain, leg swelling and PND.  Gastrointestinal:  Negative for abdominal pain and heartburn.       Abdominal fullness  Genitourinary:  Negative for dysuria.     Objective:   Vitals:   03/02/23 1054  BP: 124/70  Pulse: (!) 53  Temp: 98 F (36.7 C)  TempSrc:  Temporal  SpO2: 99%  Weight: 137 lb 9.6 oz (62.4 kg)  Height: '5\' 8"'$  (1.727 m)   99% on RA  BMI Readings from Last 3 Encounters:  03/02/23 20.92 kg/m  01/19/23 19.71 kg/m  12/23/22 17.79 kg/m   Wt Readings from Last 3 Encounters:  03/02/23 137 lb 9.6 oz (62.4 kg)  01/19/23 129 lb 9.6 oz (58.8 kg)  12/23/22 117 lb (53.1 kg)    Physical Exam Constitutional:      General: He is not in acute distress.    Appearance: Normal appearance. He is not ill-appearing.  HENT:     Mouth/Throat:     Mouth: Mucous membranes are moist.  Eyes:     Pupils: Pupils are equal, round, and reactive to light.  Cardiovascular:     Rate and Rhythm: Regular rhythm. Bradycardia present.     Pulses: Normal pulses.     Heart sounds: Normal heart sounds.  Pulmonary:     Effort: Pulmonary effort is normal.     Breath sounds: Normal breath sounds. No wheezing or rales.  Abdominal:  General: There is no distension.     Palpations: Abdomen is soft.  Musculoskeletal:        General: Normal range of motion.     Right lower leg: No edema.     Left lower leg: No edema.  Neurological:     General: No focal deficit present.     Mental Status: He is alert and oriented to person, place, and time. Mental status is at baseline.     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 Tasia Catchings   Hyperlipidemia    Low back pain    Peripheral vascular disease ([place])      Family History  Problem Relation Age of Onset   Heart attack Father    Kidney cancer Neg Hx        lung cancer   Bladder Cancer Neg Hx    Prostate cancer Neg Hx      Past Surgical History:  Procedure Laterality Date   COLONOSCOPY     COLONOSCOPY WITH PROPOFOL N/A 07/23/2015   Procedure: COLONOSCOPY WITH PROPOFOL;  Surgeon: [name], MD;  Location: [facility] ENDOSCOPY;  Service: Endoscopy;  Laterality: N/A;    COLONOSCOPY WITH PROPOFOL N/A 04/20/2022   Procedure: COLONOSCOPY WITH PROPOFOL;  Surgeon: Jonathon Bellows, MD;  Location[facility] ENDOSCOPY;  Service: Gastroenterology;  Laterality: N/A;   ESOPHAGOGASTRODUODENOSCOPY (EGD) WITH PROPOFOL N/A 08/20/2015   Procedure: ESOPHAGOGASTRODUODENOSCOPY (EGD) WITH PROPOFOL;  Surgeon: [name], MD;  Location: [facility] ENDOSCOPY;  Service: Endoscopy;  Laterality: N/A;   LOWER EXTREMITY ANGIOGRAPHY Left 05/19/2018   Procedure: LOWER EXTREMITY ANGIOGRAPHY;  Surgeon: Algernon Huxley, MD;  Location: [place] [facility];  Service: Cardiovascular;  Laterality: Left;   LOWER EXTREMITY ANGIOGRAPHY Right 06/15/2018   Procedure: LOWER EXTREMITY ANGIOGRAPHY;  Surgeon: Algernon Huxley, MD;  Location: [place] [facility];  Service: Cardiovascular;  Laterality: Right;   lymp node removal Right 2011   arm   left arm 2015   LYMPH NODE BIOPSY  08/2014    Social History   Socioeconomic History   Marital status: Married    Spouse name: Not on file   Number of children: Not on file   Years of education: Not on file   Highest education level: Not on file  Occupational History   Not on file  Tobacco Use   Smoking status: Former    Types: Cigars    Quit date: 07/2022    Years since quitting: 0.6   Smokeless tobacco: Never   Tobacco comments:    .5 ppd last year, total of 48.5 pack year  Vaping Use   Vaping Use: Never used  Substance and Sexual Activity   Alcohol use: No   Drug use: No   Sexual activity: Yes  Other Topics Concern   Not on file  Social History Narrative   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    Let go from [place] 11/2019 unexpectedly   Sons 2, daughters 4   10 grandchildren   Social Determinants of Health   Financial Resource Strain: Low Risk  (08/03/2022)   Overall Financial Resource Strain (CARDIA)    Difficulty of Paying Living Expenses: Not hard at all  Food Insecurity: No Food Insecurity (08/03/2022)   Hunger Vital Sign    Worried  About Charity fundraiser in  the Last Year: Never true    [place] in the Last Year: Never true  Transportation Needs: No Transportation Needs (08/03/2022)   PRAPARE - Hydrologist (Medical): No    Lack of Transportation (Non-Medical): No  Physical Activity: Sufficiently Active (08/03/2022)   Exercise Vital Sign    Days of Exercise per Week: 7 days    Minutes of Exercise per Session: 30 min  Stress: No Stress Concern Present (08/03/2022)   Friendly    Feeling of Stress : Not at all  Social Connections: Unknown (08/03/2022)   Social Connection and Isolation Panel [NHANES]    Frequency of Communication with Friends and Family: Not on file    Frequency of Social Gatherings with Friends and Family: Not on file    Attends Religious Services: Not on file    Active Member of Clubs or Organizations: Not on file    Attends Archivist Meetings: Not on file    Marital Status: Married  Intimate Partner Violence: Not At Risk (08/03/2022)   Humiliation, Afraid, Rape, and Kick questionnaire    Fear of Current or Ex-Partner: No    Emotionally Abused: No    Physically Abused: No    Sexually Abused: No     Allergies  Allergen Reactions   Penicillins     Other reaction(s): Rash, Hives ()   Latex Rash     CBC    Component Value Date/Time   WBC 114.0 (HH) 01/19/2023 0947   RBC 3.02 (L) 01/19/2023 0947   HGB 7.9 (L) 01/19/2023 0947   HGB 13.0 03/13/2015 0852   HCT 28.2 (L) 01/19/2023 0947   HCT 40.9 03/13/2015 0852   PLT 246 01/19/2023 0947   PLT 217 03/13/2015 0852   MCV 93.4 01/19/2023 0947   MCV 78 (L) 03/13/2015 0852   MCH 26.2 01/19/2023 0947   MCHC 28.0 (L) 01/19/2023 0947   RDW 20.3 (H) 01/19/2023 0947   RDW 16.0 (H) 03/13/2015 0852   LYMPHSABS 110.1 (H) 01/19/2023 0947   LYMPHSABS 3.9 (H) 03/13/2015 0852   MONOABS 1.7 (H) 01/19/2023 0947   MONOABS 0.4 03/13/2015 0852    EOSABS 0.1 01/19/2023 0947   EOSABS 0.4 03/13/2015 0852   BASOSABS 0.0 01/19/2023 0947   BASOSABS 0.1 03/13/2015 0852    Pulmonary Functions Testing Results:     No data to display          Outpatient Medications Prior to Visit  Medication Sig Dispense Refill   aspirin EC 81 MG tablet Take 1 tablet (81 mg total) by mouth daily. Swallow whole. 90 tablet 3   dexamethasone (DECADRON) 4 MG tablet Take 1 tablet (4 mg total) by mouth daily. 6 tablet 0   hydrocortisone cream 1 % Apply 1 application. topically 2 (two) times daily. 30 g 0   montelukast (SINGULAIR) 10 MG tablet Take 1 tablet (10 mg total) by mouth See admin instructions. Take 1 tablet on the day of next appointment 1 tablet 0   pantoprazole (PROTONIX) 40 MG tablet Take 1 tablet (40 mg total) by mouth daily. Prior to breakfast, 30 min to 1 hr 30 tablet 3   rosuvastatin (CRESTOR) 10 MG tablet Take 1 tablet (10 mg total) by mouth daily. 90 tablet 3   tamsulosin (FLOMAX) 0.4 MG CAPS capsule Take 1 capsule (0.4 mg total) by mouth daily. Give 30 30 capsule 1   traMADol (ULTRAM) 50 MG  tablet Take 1 tablet (50 mg total) by mouth every 6 (six) hours as needed. 30 tablet 0   traZODone (DESYREL) 50 MG tablet Take 0.5-1 tablets (25-50 mg total) by mouth at bedtime as needed for sleep. 30 tablet 3   zanubrutinib (BRUKINSA) 80 MG capsule Take 2 capsules (160 mg total) by mouth 2 (two) times daily. 120 capsule 1   No facility-administered medications prior to visit.

## 2023-03-03 ENCOUNTER — Ambulatory Visit: Payer: Medicare HMO | Admitting: Student in an Organized Health Care Education/Training Program

## 2023-03-03 ENCOUNTER — Inpatient Hospital Stay: Payer: Medicare HMO | Admitting: Oncology

## 2023-03-03 ENCOUNTER — Inpatient Hospital Stay: Payer: Medicare HMO | Attending: Oncology

## 2023-03-03 DIAGNOSIS — C9110 Chronic lymphocytic leukemia of B-cell type not having achieved remission: Secondary | ICD-10-CM | POA: Insufficient documentation

## 2023-03-03 LAB — COMPREHENSIVE METABOLIC PANEL
ALT: 16 U/L (ref 0–44)
AST: 20 U/L (ref 15–41)
Albumin: 4.1 g/dL (ref 3.5–5.0)
Alkaline Phosphatase: 49 U/L (ref 38–126)
Anion gap: 4 — ABNORMAL LOW (ref 5–15)
BUN: 14 mg/dL (ref 8–23)
CO2: 25 mmol/L (ref 22–32)
Calcium: 9 mg/dL (ref 8.9–10.3)
Chloride: 107 mmol/L (ref 98–111)
Creatinine, Ser: 1.05 mg/dL (ref 0.61–1.24)
GFR, Estimated: >60 mL/min (ref >60)
Glucose, Bld: 96 mg/dL (ref 70–99)
Potassium: 4.6 mmol/L (ref 3.5–5.1)
Sodium: 136 mmol/L (ref 135–145)
Total Bilirubin: 0.8 mg/dL (ref 0.3–1.2)
Total Protein: 6.5 g/dL (ref 6.5–8.1)

## 2023-03-03 LAB — CBC WITH DIFFERENTIAL/PLATELET
Abs Immature Granulocytes: 0.22 10*3/uL — ABNORMAL HIGH (ref 0.00–0.07)
Basophils Absolute: 0.1 10*3/uL (ref 0.0–0.1)
Basophils Relative: 0 %
Eosinophils Absolute: 0.3 10*3/uL (ref 0.0–0.5)
Eosinophils Relative: 1 %
HCT: 35.4 % — ABNORMAL LOW (ref 39.0–52.0)
Hemoglobin: 10.5 g/dL — ABNORMAL LOW (ref 13.0–17.0)
Immature Granulocytes: 1 %
Lymphocytes Relative: 89 %
Lymphs Abs: 37.9 10*3/uL — ABNORMAL HIGH (ref 0.7–4.0)
MCH: 27 pg (ref 26.0–34.0)
MCHC: 29.7 g/dL — ABNORMAL LOW (ref 30.0–36.0)
MCV: 91 fL (ref 80.0–100.0)
Monocytes Absolute: 0.8 10*3/uL (ref 0.1–1.0)
Monocytes Relative: 2 %
Neutro Abs: 2.8 10*3/uL (ref 1.7–7.7)
Neutrophils Relative %: 7 %
Platelets: 180 10*3/uL (ref 150–400)
RBC: 3.89 MIL/uL — ABNORMAL LOW (ref 4.22–5.81)
RDW: 15.5 % (ref 11.5–15.5)
Smear Review: NORMAL
WBC: 42 10*3/uL — ABNORMAL HIGH (ref 4.0–10.5)
nRBC: 0 % (ref 0.0–0.2)

## 2023-03-03 LAB — LACTATE DEHYDROGENASE: LDH: 121 U/L (ref 98–192)

## 2023-03-03 NOTE — Assessment & Plan Note (Signed)
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 He prefers not to try reduced dose.  S/p Rituximab weekly x 4, CT shows disease progression, with bulky lymphadenopathy. Patient declines PET scan. Abdominal mass biopsy confirmed CLL, no evidence of transformation Labs reviewed and discussed with patient. Wbc is trending down Overall he tolerates Zanubrutinib 160 mg twice daily.  Recommend patient to continue current regimen.  

## 2023-03-05 ENCOUNTER — Ambulatory Visit: Payer: Medicare HMO | Admitting: Student in an Organized Health Care Education/Training Program

## 2023-03-05 LAB — SAMPLE TO BLOOD BANK

## 2023-03-05 NOTE — Progress Notes (Signed)
No show

## 2023-03-09 ENCOUNTER — Other Ambulatory Visit (HOSPITAL_COMMUNITY): Payer: Self-pay

## 2023-03-10 ENCOUNTER — Inpatient Hospital Stay: Payer: Medicare HMO | Admitting: Oncology

## 2023-03-10 NOTE — Assessment & Plan Note (Deleted)
CLL treatment as listed above 

## 2023-03-10 NOTE — Assessment & Plan Note (Deleted)
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 He prefers not to try reduced dose.  S/p Rituximab weekly x 4, CT shows disease progression, with bulky lymphadenopathy. Patient declines PET scan. Abdominal mass biopsy confirmed CLL, no evidence of transformation Labs reviewed and discussed with patient. Wbc is trending down Overall he tolerates Zanubrutinib 160 mg twice daily.  Recommend patient to continue current regimen.  

## 2023-03-15 ENCOUNTER — Other Ambulatory Visit (HOSPITAL_COMMUNITY): Payer: Self-pay

## 2023-03-17 ENCOUNTER — Other Ambulatory Visit (HOSPITAL_COMMUNITY): Payer: Self-pay

## 2023-04-06 ENCOUNTER — Other Ambulatory Visit (HOSPITAL_COMMUNITY): Payer: Self-pay

## 2023-04-07 ENCOUNTER — Other Ambulatory Visit: Payer: Self-pay

## 2023-04-07 DIAGNOSIS — C9110 Chronic lymphocytic leukemia of B-cell type not having achieved remission: Secondary | ICD-10-CM

## 2023-04-08 ENCOUNTER — Other Ambulatory Visit: Payer: Self-pay

## 2023-04-08 ENCOUNTER — Encounter: Payer: Self-pay | Admitting: Oncology

## 2023-04-08 ENCOUNTER — Inpatient Hospital Stay: Payer: Medicare HMO

## 2023-04-08 ENCOUNTER — Other Ambulatory Visit (HOSPITAL_COMMUNITY): Payer: Self-pay

## 2023-04-08 ENCOUNTER — Inpatient Hospital Stay: Payer: Medicare HMO | Attending: Oncology | Admitting: Oncology

## 2023-04-08 VITALS — BP 132/70 | HR 46 | Temp 96.2°F | Resp 18 | Wt 134.1 lb

## 2023-04-08 DIAGNOSIS — C9110 Chronic lymphocytic leukemia of B-cell type not having achieved remission: Secondary | ICD-10-CM | POA: Diagnosis not present

## 2023-04-08 DIAGNOSIS — Z720 Tobacco use: Secondary | ICD-10-CM | POA: Diagnosis not present

## 2023-04-08 DIAGNOSIS — Z87891 Personal history of nicotine dependence: Secondary | ICD-10-CM | POA: Insufficient documentation

## 2023-04-08 DIAGNOSIS — D649 Anemia, unspecified: Secondary | ICD-10-CM | POA: Diagnosis not present

## 2023-04-08 DIAGNOSIS — Z5111 Encounter for antineoplastic chemotherapy: Secondary | ICD-10-CM

## 2023-04-08 LAB — CBC WITH DIFFERENTIAL (CANCER CENTER ONLY)
Abs Immature Granulocytes: 0.16 10*3/uL — ABNORMAL HIGH (ref 0.00–0.07)
Basophils Absolute: 0.1 10*3/uL (ref 0.0–0.1)
Basophils Relative: 0 %
Eosinophils Absolute: 0.2 10*3/uL (ref 0.0–0.5)
Eosinophils Relative: 1 %
HCT: 38.1 % — ABNORMAL LOW (ref 39.0–52.0)
Hemoglobin: 11.7 g/dL — ABNORMAL LOW (ref 13.0–17.0)
Immature Granulocytes: 1 %
Lymphocytes Relative: 82 %
Lymphs Abs: 16.4 10*3/uL — ABNORMAL HIGH (ref 0.7–4.0)
MCH: 26.1 pg (ref 26.0–34.0)
MCHC: 30.7 g/dL (ref 30.0–36.0)
MCV: 84.9 fL (ref 80.0–100.0)
Monocytes Absolute: 1 10*3/uL (ref 0.1–1.0)
Monocytes Relative: 5 %
Neutro Abs: 2.2 10*3/uL (ref 1.7–7.7)
Neutrophils Relative %: 11 %
Platelet Count: 165 10*3/uL (ref 150–400)
RBC: 4.49 MIL/uL (ref 4.22–5.81)
RDW: 14.5 % (ref 11.5–15.5)
Smear Review: NORMAL
WBC Count: 20.1 10*3/uL — ABNORMAL HIGH (ref 4.0–10.5)
nRBC: 0 % (ref 0.0–0.2)

## 2023-04-08 LAB — CMP (CANCER CENTER ONLY)
ALT: 15 U/L (ref 0–44)
AST: 21 U/L (ref 15–41)
Albumin: 4.1 g/dL (ref 3.5–5.0)
Alkaline Phosphatase: 52 U/L (ref 38–126)
Anion gap: 7 (ref 5–15)
BUN: 10 mg/dL (ref 8–23)
CO2: 24 mmol/L (ref 22–32)
Calcium: 8.8 mg/dL — ABNORMAL LOW (ref 8.9–10.3)
Chloride: 106 mmol/L (ref 98–111)
Creatinine: 0.99 mg/dL (ref 0.61–1.24)
GFR, Estimated: >60 mL/min (ref >60)
Glucose, Bld: 106 mg/dL — ABNORMAL HIGH (ref 70–99)
Potassium: 4.4 mmol/L (ref 3.5–5.1)
Sodium: 137 mmol/L (ref 135–145)
Total Bilirubin: 0.6 mg/dL (ref 0.3–1.2)
Total Protein: 6.6 g/dL (ref 6.5–8.1)

## 2023-04-08 LAB — LACTATE DEHYDROGENASE: LDH: 110 U/L (ref 98–192)

## 2023-04-08 LAB — SAMPLE TO BLOOD BANK

## 2023-04-08 MED ORDER — BRUKINSA 80 MG PO CAPS
160.0000 mg | ORAL_CAPSULE | Freq: Two times a day (BID) | ORAL | 1 refills | Status: DC
Start: 2023-04-08 — End: 2023-07-12
  Filled 2023-04-08 – 2023-04-14 (×2): qty 120, 30d supply, fill #0
  Filled 2023-05-11: qty 120, 30d supply, fill #1

## 2023-04-08 NOTE — Assessment & Plan Note (Signed)
CLL treatment as listed above 

## 2023-04-08 NOTE — Progress Notes (Signed)
Hematology/Oncology Progress note [phone] [phone]   Chief Complaint: [name] is a 72 y.o. male presents to follow up for chronic lymphocytic lymphoma.   ASSESSMENT & PLAN:   Cancer Staging  Chronic lymphocytic leukemia (CLL), B-cell Staging form: Chronic Lymphocytic Leukemia / Small Lymphocytic Lymphoma, AJCC 8th Edition - Clinical stage from 07/23/2017: Modified Rai Stage III (Modified Rai risk: High, Lymphocytosis: Present, Adenopathy: Present, Organomegaly: Absent, Anemia: Present, Thrombocytopenia: Absent) - Signed by [name], MD on 02/19/2022   Chronic lymphocytic leukemia (CLL), B-cell (HCC) Recurrent CLL, trisomy 12 and TP53 mutated, IgVH Somatic Hypermutation unmutated He did not Acalabrutinib due to diarrhea. He prefers not to try reduced dose, S/p Rituximab weekly x 4, CT showed progression--> Abdominal mass biopsy confirmed CLL, no evidence of transformation Labs reviewed and discussed with patient. Wbc is trending down Overall he tolerates Zanubrutinib 160 mg twice daily.  Recommend patient to continue current regimen.   Encounter for antineoplastic chemotherapy CLL treatment as listed above  Normocytic anemia Secondary to CLL with bone marrow involvement Hb has improved  Tobacco abuse Recommend smoke cessation.    Follow-up  2 months lab MD    All questions were answered. The patient knows to call the clinic with any problems, questions or concerns.  [name], MD, PhD [facility] Hematology Oncology 04/08/2023     HPI:   Oncology History  Chronic lymphocytic leukemia (CLL), B-cell  09/13/2014 Initial Diagnosis   CLL - hemolytic anemia in 01/2014.  He was treated with prednisone. 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  -Oct 2020 second opinion at [facility] and was evaluated by heme-onc Dr.Ellis.  Patient was recommended for watchful waiting   10/02/2014 - 10/23/2014 Chemotherapy   -10/02/2014- 10/23/2014  4 weekly cycles of Rituxan  -07/23/2017 Decision was made to start Ibrutinib as patient is very symptomatic (fatigue and weight loss, lack of appetite). Patient did not show up at the chemo education class   05/08/2022 Imaging   CT CHEST, ABDOMEN, AND PELVIS WITH CONTRAST 1 unchanged enlarged bilateral axillary lymph nodes and prominent pretracheal mediastinal lymph nodes. 2. However, when compared to most recent prior CT examination of the abdomen and pelvis dated 06/24/2016, there are numerous, extremely bulky lymph nodes throughout the abdomen and pelvis, which are greatly increased in size. Newly enlarged left inguinal lymph nodes, in keeping with reported palpable lymphadenopathy. 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 3. Mild right hydronephrosis; the proximal ureter appears compressed or obstructed by overlying lymphadenopathy. 4. Emphysema.5. Coronary artery disease.   05/30/2022 Imaging   PET scan showed  1. Again seen is massive nodal conglomeration within the abdomen with mildly diffuse increased tracer uptake with SUV max of 3.15.There also enlarged bilateral pelvic and left inguinal lymph nodes which exhibit mild increased tracer uptake compatible with residual/recurrent lymphoma. Imaging findings are compatible with residual/recurrent metabolically active low-grade lymphoma. 2. No significant tracer uptake associated with the borderline enlarged bilateral axillary lymph nodes.3. Unchanged appearance of small nonspecific subpleural nodules within the periphery of the upper lobes measuring up to 4 mm. These are too small to characterize by PET-CT.4. Aortic Atherosclerosis (ICD10-I70.0) and Emphysema (ICD10-J43.9). 5. Coronary artery calcifications. 6. Normal size spleen with background activity similar to the liver   06/22/2022  -  Chemotherapy   acalabrutinib 100 mg twice daily.   07/27/2022 - 08/20/2022  Chemotherapy   Patient is on Treatment Plan : ITP Rituximab q7d x 4 cycles     11/09/2022 Imaging   CT abdomen pelvis w contrast  1. Progressive bulky retroperitoneal and mesenteric lymphadenopathy consistent with progressive lymphoma. 2. No evidence of bowel obstruction, perforation or abscess. 3. Stable small amount of free pelvic fluid. 4. Aortic Atherosclerosis (ICD10-I70.0). Chronically occluded right femoral arterial stent    11/09/2022 [facility] Admission   Patient presented to emergency room for evaluation of shortness of breath.  He has noticed progressive worsening of abdominal swelling and distention.  Associated with shortness of breath.  Patient was started on diuretics.     11/24/2022 Procedure   CT guided biopsy of abdomen mass  Pathology showed CLL/SLL Core biopsy sections demonstrate lymphoid tissue with partially effaced follicular architecture.  The bulk of the tissue consists of sheets of small lymphocytes with dense chromatin and scant cytoplasm.  There is no significant increase in larger prolymphocytic type cells.  Relatively few small germinal centers are appreciated.  Concurrent flow cytometric studies demonstrate a CD5+, CD23 + monoclonal B-cell population comprising 87% of total lymphoid cells and 100% of B cells.  The phenotype is compatible with chronic lymphocytic leukemia/small lymphocytic lymphoma.  There are no features in the current sample to suggest progression to higher grade process. Clinical and radiographic correlation is recommended.    12/09/2022 -  Chemotherapy   Zanubrutinib 160 mg twice daily     INTERVAL HISTORY 72 y.o. male with history of CLL presents for follow-up. Patient has been on Zanubrutinib 160 mg twice daily since He has gained weight since last visit. Denies any nausea vomiting diarrhea, denies abdominal pain.  Not taking her other medications.       Past Medical History:  Diagnosis Date   Anemia 2008   Arthritis    Cervicalgia    Chronic lymphocytic leukemia (CLL), B-cell 06/15/2015   Dr. Cathie Hoops   Hyperlipidemia    Low back pain    Peripheral vascular disease     Past Surgical History:  Procedure Laterality Date   COLONOSCOPY     COLONOSCOPY WITH PROPOFOL N/A 07/23/2015   Procedure: COLONOSCOPY WITH PROPOFOL;  Surgeon: Earline Mayotte, MD;  Location: [facility] ENDOSCOPY;  Service: Endoscopy;  Laterality: N/A;   COLONOSCOPY WITH PROPOFOL N/A 04/20/2022   Procedure: COLONOSCOPY WITH PROPOFOL;  Surgeon: Wyline Mood, MD;  Location: [facility] ENDOSCOPY;  Service: Gastroenterology;  Laterality: N/A;   ESOPHAGOGASTRODUODENOSCOPY (EGD) WITH PROPOFOL N/A 08/20/2015   Procedure: ESOPHAGOGASTRODUODENOSCOPY (EGD) WITH PROPOFOL;  Surgeon: Earline Mayotte, MD;  Location: [facility] ENDOSCOPY;  Service: Endoscopy;  Laterality: N/A;   LOWER EXTREMITY ANGIOGRAPHY Left 05/19/2018   Procedure: LOWER EXTREMITY ANGIOGRAPHY;  Surgeon: Annice Needy, MD;  Location: [facility] INVASIVE CV LAB;  Service: Cardiovascular;  Laterality: Left;   LOWER EXTREMITY ANGIOGRAPHY Right 06/15/2018   Procedure: LOWER EXTREMITY ANGIOGRAPHY;  Surgeon: Annice Needy, MD;  Location: [facility] INVASIVE CV LAB;  Service: Cardiovascular;  Laterality: Right;   lymp node removal Right 2011   arm   left arm 2015   LYMPH NODE BIOPSY  08/2014    Family History  Problem Relation Age of Onset   Heart attack Father    Kidney cancer Neg Hx        lung cancer   Bladder Cancer Neg Hx    Prostate cancer Neg Hx     Social History:  reports that he quit smoking about 8 months ago. His smoking use included cigars.  He has never used smokeless tobacco. He reports that he does not drink alcohol and does not use drugs.  Allergies:  Allergies  Allergen Reactions   Penicillins     Other reaction(s): Rash, Hives ()   Latex Rash   Review of Systems:  Review of Systems  Constitutional:  Negative for chills,  fever, malaise/fatigue and weight loss.  HENT:  Negative for ear discharge, ear pain, nosebleeds and sore throat.   Eyes:  Negative for photophobia, pain and redness.  Respiratory:  Negative for cough, hemoptysis, sputum production, shortness of breath and wheezing.   Cardiovascular:  Negative for chest pain, palpitations and leg swelling.  Gastrointestinal:  Negative for abdominal pain, blood in stool, constipation, diarrhea, heartburn, nausea and vomiting.  Genitourinary:  Negative for dysuria and frequency.  Musculoskeletal:  Negative for myalgias and neck pain.  Skin:  Negative for rash.  Neurological:  Negative for dizziness, tingling, tremors, weakness and headaches.  Endo/Heme/Allergies:  Does not bruise/bleed easily.  Psychiatric/Behavioral:  Negative for depression, hallucinations and suicidal ideas.      Current Outpatient Medications on File Prior to Visit  Medication Sig Dispense Refill   aspirin EC 81 MG tablet Take 1 tablet (81 mg total) by mouth daily. Swallow whole. (Patient not taking: Reported on 04/08/2023) 90 tablet 3   hydrocortisone cream 1 % Apply 1 application. topically 2 (two) times daily. (Patient not taking: Reported on 04/08/2023) 30 g 0   montelukast (SINGULAIR) 10 MG tablet Take 1 tablet (10 mg total) by mouth See admin instructions. Take 1 tablet on the day of next appointment (Patient not taking: Reported on 04/08/2023) 1 tablet 0   pantoprazole (PROTONIX) 40 MG tablet Take 1 tablet (40 mg total) by mouth daily. Prior to breakfast, 30 min to 1 hr (Patient not taking: Reported on 04/08/2023) 30 tablet 3   rosuvastatin (CRESTOR) 10 MG tablet Take 1 tablet (10 mg total) by mouth daily. (Patient not taking: Reported on 04/08/2023) 90 tablet 3   tamsulosin (FLOMAX) 0.4 MG CAPS capsule Take 1 capsule (0.4 mg total) by mouth daily. Give 30 (Patient not taking: Reported on 04/08/2023) 30 capsule 1   traMADol (ULTRAM) 50 MG tablet Take 1 tablet (50 mg total) by mouth every 6  (six) hours as needed. (Patient not taking: Reported on 04/08/2023) 30 tablet 0   traZODone (DESYREL) 50 MG tablet Take 0.5-1 tablets (25-50 mg total) by mouth at bedtime as needed for sleep. (Patient not taking: Reported on 04/08/2023) 30 tablet 3   umeclidinium-vilanterol (ANORO ELLIPTA) 62.5-25 MCG/ACT AEPB Inhale 1 puff into the lungs daily. (Patient not taking: Reported on 04/08/2023) 30 each 11   No current facility-administered medications on file prior to visit.    Physical Exam: Blood pressure 132/70, pulse (!) 46, temperature (!) 96.2 F (35.7 C), resp. rate 18, weight 134 lb 1.6 oz (60.8 kg). Physical Exam Constitutional:      General: He is not in acute distress.    Appearance: He is not diaphoretic.  HENT:     Head: Normocephalic.     Nose: Nose normal.     Mouth/Throat:     Pharynx: No oropharyngeal exudate.  Eyes:     General: No scleral icterus.    Pupils: Pupils are equal, round, and reactive to light.  Cardiovascular:     Rate and Rhythm: Normal rate and regular rhythm.  Pulmonary:     Effort: Pulmonary effort is normal. No respiratory distress.     Breath sounds: No  wheezing.  Abdominal:     General: Bowel sounds are normal. There is no distension.     Palpations: Abdomen is soft. There is no mass.     Tenderness: There is no abdominal tenderness.  Genitourinary:    Comments:   Musculoskeletal:        General: Normal range of motion.     Cervical back: Normal range of motion.  Skin:    General: Skin is warm and dry.     Findings: No erythema.  Neurological:     Mental Status: He is alert and oriented to person, place, and time. Mental status is at baseline.     Cranial Nerves: No cranial nerve deficit.     Motor: No abnormal muscle tone.  Psychiatric:        Mood and Affect: Mood and affect normal.     LABORATORY RESULTS.    Latest Ref Rng & Units 04/08/2023    9:31 AM 03/03/2023    9:55 AM 01/19/2023    9:47 AM  CBC  WBC 4.0 - 10.5 K/uL 20.1  42.0   114.0   Hemoglobin 13.0 - 17.0 g/dL 21.3  08.6  7.9   Hematocrit 39.0 - 52.0 % 38.1  35.4  28.2   Platelets 150 - 400 K/uL 165  180  246       Latest Ref Rng & Units 04/08/2023    9:31 AM 03/03/2023    9:55 AM 01/19/2023    9:47 AM  CMP  Glucose 70 - 99 mg/dL 578  96  469   BUN 8 - 23 mg/dL Creatinine 0.61 - 1.24 mg/dL 6.29  5.28  4.13   Sodium 135 - 145 mmol/L 137  136  137   Potassium 3.5 - 5.1 mmol/L 4.4  4.6  4.1   Chloride 98 - 111 mmol/L 106  107  106   CO2 22 - 32 mmol/L Calcium 8.9 - 10.3 mg/dL 8.8  9.0  8.9   Total Protein 6.5 - 8.1 g/dL 6.6  6.5  6.4   Total Bilirubin 0.3 - 1.2 mg/dL 0.6  0.8  0.6   Alkaline Phos 38 - 126 U/L 52  49  59   AST 15 - 41 U/L ALT 0 - 44 U/L PATHOLOGY:  11/02/2016 Peripheral blood FISH studies revealed 20% of nuclei positive for loss of 1 ATM signal, 63% of nuclei positive for trisomy 12, and 32% of nuclei positive for loss of 1 TP53 signal. Results for CCND1/IGH and 13q were normal.  09/13/2014 Left axillary node biopsy on  confirmed B-cell small lymphocytic lymphoma (B-CLL/SLL)   IMAGE STUDIES No results found.

## 2023-04-08 NOTE — Assessment & Plan Note (Signed)
Recommend smoke cessation.  

## 2023-04-08 NOTE — Assessment & Plan Note (Signed)
Secondary to CLL with bone marrow involvement Hb has improved

## 2023-04-08 NOTE — Assessment & Plan Note (Addendum)
Recurrent CLL, trisomy 12 and TP53 mutated, IgVH Somatic Hypermutation unmutated He did not Acalabrutinib due to diarrhea. He prefers not to try reduced dose, S/p Rituximab weekly x 4, CT showed progression--> Abdominal mass biopsy confirmed CLL, no evidence of transformation Labs reviewed and discussed with patient. Wbc is trending down Overall he tolerates Zanubrutinib 160 mg twice daily.  Repeat CT for assessment of treatment response.  Recommend patient to continue current regimen.

## 2023-04-08 NOTE — Progress Notes (Signed)
Pt here for follow up. He states he is only taking Brukinsa, not taking all other medications because he is not sure if he can since he is on this medication.

## 2023-04-12 ENCOUNTER — Other Ambulatory Visit (HOSPITAL_COMMUNITY): Payer: Self-pay

## 2023-04-14 ENCOUNTER — Other Ambulatory Visit (HOSPITAL_COMMUNITY): Payer: Self-pay

## 2023-04-14 ENCOUNTER — Other Ambulatory Visit: Payer: Self-pay

## 2023-04-15 ENCOUNTER — Other Ambulatory Visit (HOSPITAL_COMMUNITY): Payer: Self-pay

## 2023-04-20 ENCOUNTER — Other Ambulatory Visit: Payer: Self-pay | Admitting: Family

## 2023-04-20 ENCOUNTER — Ambulatory Visit (INDEPENDENT_AMBULATORY_CARE_PROVIDER_SITE_OTHER): Payer: Medicare HMO | Admitting: Family

## 2023-04-20 ENCOUNTER — Encounter: Payer: Self-pay | Admitting: Family

## 2023-04-20 VITALS — BP 132/76 | HR 65 | Temp 98.0°F | Ht 68.0 in | Wt 135.0 lb

## 2023-04-20 DIAGNOSIS — R0982 Postnasal drip: Secondary | ICD-10-CM

## 2023-04-20 DIAGNOSIS — R0602 Shortness of breath: Secondary | ICD-10-CM | POA: Diagnosis not present

## 2023-04-20 DIAGNOSIS — G4700 Insomnia, unspecified: Secondary | ICD-10-CM | POA: Diagnosis not present

## 2023-04-20 DIAGNOSIS — E785 Hyperlipidemia, unspecified: Secondary | ICD-10-CM

## 2023-04-20 DIAGNOSIS — R0609 Other forms of dyspnea: Secondary | ICD-10-CM

## 2023-04-20 DIAGNOSIS — J432 Centrilobular emphysema: Secondary | ICD-10-CM | POA: Diagnosis not present

## 2023-04-20 DIAGNOSIS — I2510 Atherosclerotic heart disease of native coronary artery without angina pectoris: Secondary | ICD-10-CM

## 2023-04-20 DIAGNOSIS — R3911 Hesitancy of micturition: Secondary | ICD-10-CM | POA: Diagnosis not present

## 2023-04-20 DIAGNOSIS — K219 Gastro-esophageal reflux disease without esophagitis: Secondary | ICD-10-CM | POA: Diagnosis not present

## 2023-04-20 LAB — LIPID PANEL
Cholesterol: 173 mg/dL (ref 0–200)
HDL: 35.1 mg/dL — ABNORMAL LOW (ref >39.00)
LDL Cholesterol: 124 mg/dL — ABNORMAL HIGH (ref 0–99)
NonHDL: 138.21
Total CHOL/HDL Ratio: 5
Triglycerides: 71 mg/dL (ref 0.0–149.0)
VLDL: 14.2 mg/dL (ref 0.0–40.0)

## 2023-04-20 MED ORDER — GUAIFENESIN ER 600 MG PO TB12
1200.0000 mg | ORAL_TABLET | Freq: Two times a day (BID) | ORAL | 3 refills | Status: AC
Start: 2023-04-20 — End: ?

## 2023-04-20 MED ORDER — AZELASTINE-FLUTICASONE 137-50 MCG/ACT NA SUSP
1.0000 | Freq: Two times a day (BID) | NASAL | 5 refills | Status: DC
Start: 2023-04-20 — End: 2023-04-23

## 2023-04-20 MED ORDER — PANTOPRAZOLE SODIUM 40 MG PO TBEC
40.0000 mg | DELAYED_RELEASE_TABLET | Freq: Every day | ORAL | 3 refills | Status: AC
Start: 2023-04-20 — End: ?

## 2023-04-20 MED ORDER — TRAZODONE HCL 50 MG PO TABS
25.0000 mg | ORAL_TABLET | Freq: Every day | ORAL | 3 refills | Status: AC
Start: 2023-04-20 — End: ?

## 2023-04-20 MED ORDER — UMECLIDINIUM-VILANTEROL 62.5-25 MCG/ACT IN AEPB
1.0000 | INHALATION_SPRAY | Freq: Every day | RESPIRATORY_TRACT | 11 refills | Status: AC
Start: 2023-04-20 — End: 2024-04-19

## 2023-04-20 MED ORDER — TAMSULOSIN HCL 0.4 MG PO CAPS
0.4000 mg | ORAL_CAPSULE | Freq: Every day | ORAL | 3 refills | Status: DC
Start: 2023-04-20 — End: 2023-09-08

## 2023-04-20 MED ORDER — ROSUVASTATIN CALCIUM 10 MG PO TABS
10.0000 mg | ORAL_TABLET | Freq: Every day | ORAL | 3 refills | Status: DC
Start: 2023-04-20 — End: 2023-04-30

## 2023-04-20 NOTE — Assessment & Plan Note (Signed)
Chronic, somewhat improved.  Discussed more formal exercise program.  Continue anoro. Reiterated the importance of follow-up with pulmonology and provided phone number today.

## 2023-04-20 NOTE — Patient Instructions (Addendum)
Lets get back to the [facility] to improve energy  Naps no longer than 30 minutes.   Take trazodone 25mg  to 50mg   Please ensure that you do call pulmonology to schedule follow-up with Dr. Aundria Rud [phone]  For thick nasal congestion, please start either Mucinex or azelastine nasal spray first for couple of weeks to see which one is more effective for you.  Please ensure plenty of water as this will help loosen up thick secretions.  Please let me know if symptoms does not improve

## 2023-04-20 NOTE — Assessment & Plan Note (Addendum)
Advised Mucinex ,azelastine nasal spray.  If symptoms persist, consider antibiotic therapy.

## 2023-04-20 NOTE — Assessment & Plan Note (Signed)
Reiterated the importance of taking trazodone nightly.

## 2023-04-20 NOTE — Assessment & Plan Note (Signed)
Pending lipid panel.  Continue Crestor 10 mg daily

## 2023-04-20 NOTE — Progress Notes (Signed)
Assessment & Plan:  PND (post-nasal drip) Assessment & Plan: Advised Mucinex ,azelastine nasal spray.  If symptoms persist, consider antibiotic therapy.  Orders: -     guaiFENesin ER; Take 2 tablets (1,200 mg total) by mouth 2 (two) times daily.  Dispense: 28 tablet; Refill: 3 -     Azelastine-Fluticasone; Place 1 spray into the nose every 12 (twelve) hours.  Dispense: 23 g; Refill: 5  Gastroesophageal reflux disease, unspecified whether esophagitis present -     Pantoprazole Sodium; Take 1 tablet (40 mg total) by mouth daily. Prior to breakfast, 30 min to 1 hr  Dispense: 90 tablet; Refill: 3  Atherosclerosis of coronary artery of native heart without angina pectoris, unspecified vessel or lesion type -     Rosuvastatin Calcium; Take 1 tablet (10 mg total) by mouth daily.  Dispense: 90 tablet; Refill: 3 -     Lipid panel  Urinary hesitancy -     Tamsulosin HCl; Take 1 capsule (0.4 mg total) by mouth daily. Give 30  Dispense: 90 capsule; Refill: 3  Insomnia, unspecified type Assessment & Plan: Reiterated the importance of taking trazodone nightly.  Orders: -     traZODone HCl; Take 0.5-1 tablets (25-50 mg total) by mouth at bedtime.  Dispense: 90 tablet; Refill: 3  Shortness of breath -     Umeclidinium-Vilanterol; Inhale 1 puff into the lungs daily.  Dispense: 30 each; Refill: 11  Centrilobular emphysema (HCC) -     Umeclidinium-Vilanterol; Inhale 1 puff into the lungs daily.  Dispense: 30 each; Refill: 11  DOE (dyspnea on exertion) Assessment & Plan: Chronic, somewhat improved.  Discussed more formal exercise program.  Continue anoro. Reiterated the importance of follow-up with pulmonology and provided phone number today.   Hyperlipidemia, unspecified hyperlipidemia type Assessment & Plan: Pending lipid panel.  Continue Crestor 10 mg daily      Return precautions given.   Risks, benefits, and alternatives of the medications and treatment plan prescribed today were  discussed, and patient expressed understanding.   Education regarding symptom management and diagnosis given to patient on AVS either electronically or printed.  Return in about 3 months (around 07/20/2023).  Rennie Plowman, FNP  Subjective:    Patient ID: [name], male    DOB: 1951/01/05, 72 y.o.   MRN: [id number]  CC: [name] is a 72 y.o. male who presents today for follow up.   HPI: He is most concerned with thick, clear and foamy mucous when he lays down at night.  This only occurs at night or in the morning. He thinks seasonal allergies are playing are role , symptoms started 6 weeks ago when season was chnaging.   No chills, weight loss, sinus pain.   Fatigue improves with exercise. He naps during the day as he is accosmtomed to doing as he used to work thrid shift. He is now retired.  Trouble falling asleep  Sleep restorative.  He has not been taking trazodone consistently.  SOB with exertion has improved. He is 'moving around more' and less SOB. Wheezing has resolved . Denies CP.         Following oncology last visit 04/08/2023, Dr. Cathie Hoops for chronic lymphocytic leukemia, normocytic anemia.  Currently undergoing chemo PO.   Last seen pulmonology 12/02/2022 follow-up due March of this year. Allergies: Penicillins and Latex Current Outpatient Medications on File Prior to Visit  Medication Sig Dispense Refill   aspirin EC 81 MG tablet Take 81 mg by mouth daily. Swallow whole. 90  tablet 3   zanubrutinib (BRUKINSA) 80 MG capsule Take 2 capsules (160 mg total) by mouth 2 (two) times daily. 120 capsule 1   No current facility-administered medications on file prior to visit.    Review of Systems  Constitutional:  Positive for fatigue. Negative for chills and fever.  HENT:  Positive for congestion. Negative for facial swelling, rhinorrhea and sinus pain.   Respiratory:  Positive for cough and shortness of breath. Negative for wheezing.   Cardiovascular:  Negative for  chest pain and palpitations.  Gastrointestinal:  Negative for nausea and vomiting.  Psychiatric/Behavioral:  Positive for sleep disturbance.       Objective:    BP 132/76   Pulse 65   Temp 98 F (36.7 C) (Oral)   Ht 5\' 8"  (1.727 m)   Wt 135 lb (61.2 kg)   SpO2 99%   BMI 20.53 kg/m  BP Readings from Last 3 Encounters:  04/20/23 132/76  04/08/23 132/70  03/02/23 124/70   Wt Readings from Last 3 Encounters:  04/20/23 135 lb (61.2 kg)  04/08/23 134 lb 1.6 oz (60.8 kg)  03/02/23 137 lb 9.6 oz (62.4 kg)    Physical Exam Vitals reviewed.  Constitutional:      Appearance: He is well-developed.  HENT:     Head: Normocephalic and atraumatic.     Right Ear: Hearing, tympanic membrane, ear canal and external ear normal. No decreased hearing noted. No drainage, swelling or tenderness. No middle ear effusion. Tympanic membrane is not injected, erythematous or bulging.     Left Ear: Hearing, tympanic membrane, ear canal and external ear normal. No decreased hearing noted. No drainage, swelling or tenderness.  No middle ear effusion. Tympanic membrane is not injected, erythematous or bulging.     Nose: Nose normal.     Right Sinus: No maxillary sinus tenderness or frontal sinus tenderness.     Left Sinus: No maxillary sinus tenderness or frontal sinus tenderness.     Mouth/Throat:     Pharynx: Uvula midline. Posterior oropharyngeal erythema present. No oropharyngeal exudate.     Tonsils: No tonsillar abscesses.  Eyes:     Conjunctiva/sclera: Conjunctivae normal.  Cardiovascular:     Rate and Rhythm: Regular rhythm.     Heart sounds: Normal heart sounds.  Pulmonary:     Effort: Pulmonary effort is normal. No respiratory distress.     Breath sounds: Normal breath sounds. No wheezing, rhonchi or rales.  Lymphadenopathy:     Head:     Right side of head: No submental, submandibular, tonsillar, preauricular, posterior auricular or occipital adenopathy.     Left side of head: No  submental, submandibular, tonsillar, preauricular, posterior auricular or occipital adenopathy.     Cervical: No cervical adenopathy.  Skin:    General: Skin is warm and dry.  Neurological:     Mental Status: He is alert.  Psychiatric:        Speech: Speech normal.        Behavior: Behavior normal.

## 2023-04-23 ENCOUNTER — Other Ambulatory Visit: Payer: Self-pay | Admitting: Family

## 2023-04-23 MED ORDER — AZELASTINE HCL 0.1 % NA SOLN: 1.0000 | Freq: Two times a day (BID) | NASAL | 2 refills | Status: DC

## 2023-04-30 MED ORDER — ROSUVASTATIN CALCIUM 20 MG PO TABS: 20.0000 mg | ORAL_TABLET | Freq: Every day | ORAL | 3 refills | Status: AC

## 2023-04-30 NOTE — Addendum Note (Signed)
Addended by: [place], Jasiel Belisle on: 04/30/2023 03:22 PM   Modules accepted: Orders

## 2023-05-11 ENCOUNTER — Other Ambulatory Visit (HOSPITAL_COMMUNITY): Payer: Self-pay

## 2023-05-13 ENCOUNTER — Other Ambulatory Visit (HOSPITAL_COMMUNITY): Payer: Self-pay

## 2023-05-18 ENCOUNTER — Telehealth: Payer: Self-pay

## 2023-05-18 ENCOUNTER — Other Ambulatory Visit (HOSPITAL_COMMUNITY): Payer: Self-pay

## 2023-05-18 NOTE — Telephone Encounter (Signed)
Oral Oncology Patient Advocate Encounter  Re-authorization   Received notification that prior authorization for Brukinsa is due for renewal.   PA submitted on 05/18/23  Key [id number]  Status is pending     Ardeen Fillers, CPhT Oncology Pharmacy Patient Advocate  [facility]  [phone] (phone) [phone] (fax) 05/18/2023 8:09 AM

## 2023-05-18 NOTE — Telephone Encounter (Signed)
Oral Oncology Patient Advocate Encounter  Prior Authorization for Brukinsa has been approved.    PA# [id number]  Effective dates: 12/21/22 through 12/21/23  Patients co-pay is $0.00.    Ardeen Fillers, CPhT Oncology Pharmacy Patient Advocate  [facility]  [phone] (phone) [phone] (fax) 05/18/2023 8:22 AM

## 2023-06-03 ENCOUNTER — Other Ambulatory Visit (HOSPITAL_COMMUNITY): Payer: Self-pay

## 2023-06-07 ENCOUNTER — Encounter (HOSPITAL_COMMUNITY): Payer: Self-pay

## 2023-06-07 ENCOUNTER — Other Ambulatory Visit (HOSPITAL_COMMUNITY): Payer: Self-pay

## 2023-06-08 ENCOUNTER — Ambulatory Visit: Admission: RE | Admit: 2023-06-08 | Payer: Medicare HMO | Source: Ambulatory Visit

## 2023-06-08 ENCOUNTER — Inpatient Hospital Stay: Payer: Medicare HMO | Attending: Oncology

## 2023-06-09 ENCOUNTER — Other Ambulatory Visit: Payer: Self-pay

## 2023-06-11 ENCOUNTER — Ambulatory Visit: Admission: RE | Admit: 2023-06-11 | Payer: Medicare HMO | Source: Ambulatory Visit

## 2023-06-15 ENCOUNTER — Inpatient Hospital Stay: Payer: Medicare HMO | Admitting: Oncology

## 2023-06-23 ENCOUNTER — Telehealth: Payer: Self-pay

## 2023-06-23 NOTE — Telephone Encounter (Signed)
Pt scheduled for MD 7/12, but no showed to prior labs and CT. If possible please r/s lab/ CT to be a few days prior to MD. Rip Harbour to r/s MD appt if needed. Please inform pt of appts.

## 2023-07-02 ENCOUNTER — Ambulatory Visit: Payer: Medicare HMO | Admitting: Oncology

## 2023-07-06 ENCOUNTER — Ambulatory Visit
Admission: RE | Admit: 2023-07-06 | Discharge: 2023-07-06 | Disposition: A | Discharge status: Home / Self Care | Payer: Medicare HMO | Source: Ambulatory Visit | Attending: Oncology | Admitting: Oncology

## 2023-07-06 ENCOUNTER — Other Ambulatory Visit: Payer: Medicare HMO

## 2023-07-06 ENCOUNTER — Inpatient Hospital Stay: Payer: Medicare HMO | Attending: Oncology

## 2023-07-06 DIAGNOSIS — I700 Atherosclerosis of aorta: Secondary | ICD-10-CM | POA: Diagnosis not present

## 2023-07-06 DIAGNOSIS — R591 Generalized enlarged lymph nodes: Secondary | ICD-10-CM | POA: Diagnosis not present

## 2023-07-06 DIAGNOSIS — D649 Anemia, unspecified: Secondary | ICD-10-CM | POA: Insufficient documentation

## 2023-07-06 DIAGNOSIS — Z87891 Personal history of nicotine dependence: Secondary | ICD-10-CM | POA: Diagnosis not present

## 2023-07-06 DIAGNOSIS — C9110 Chronic lymphocytic leukemia of B-cell type not having achieved remission: Secondary | ICD-10-CM | POA: Insufficient documentation

## 2023-07-06 LAB — CMP (CANCER CENTER ONLY)
ALT: 14 U/L (ref 0–44)
AST: 21 U/L (ref 15–41)
Albumin: 4 g/dL (ref 3.5–5.0)
Alkaline Phosphatase: 45 U/L (ref 38–126)
Anion gap: 6 (ref 5–15)
BUN: 12 mg/dL (ref 8–23)
CO2: 24 mmol/L (ref 22–32)
Calcium: 8.3 mg/dL — ABNORMAL LOW (ref 8.9–10.3)
Chloride: 104 mmol/L (ref 98–111)
Creatinine: 1.14 mg/dL (ref 0.61–1.24)
GFR, Estimated: >60 mL/min (ref >60)
Glucose, Bld: 114 mg/dL — ABNORMAL HIGH (ref 70–99)
Potassium: 4 mmol/L (ref 3.5–5.1)
Sodium: 134 mmol/L — ABNORMAL LOW (ref 135–145)
Total Bilirubin: 0.5 mg/dL (ref 0.3–1.2)
Total Protein: 6.3 g/dL — ABNORMAL LOW (ref 6.5–8.1)

## 2023-07-06 LAB — CBC WITH DIFFERENTIAL (CANCER CENTER ONLY)
Abs Immature Granulocytes: 0.5 10*3/uL — ABNORMAL HIGH (ref 0.00–0.07)
Basophils Absolute: 0.1 10*3/uL (ref 0.0–0.1)
Basophils Relative: 1 %
Eosinophils Absolute: 0.2 10*3/uL (ref 0.0–0.5)
Eosinophils Relative: 1 %
HCT: 36.6 % — ABNORMAL LOW (ref 39.0–52.0)
Hemoglobin: 11.4 g/dL — ABNORMAL LOW (ref 13.0–17.0)
Immature Granulocytes: 4 %
Lymphocytes Relative: 61 %
Lymphs Abs: 8.4 10*3/uL — ABNORMAL HIGH (ref 0.7–4.0)
MCH: 24.7 pg — ABNORMAL LOW (ref 26.0–34.0)
MCHC: 31.1 g/dL (ref 30.0–36.0)
MCV: 79.4 fL — ABNORMAL LOW (ref 80.0–100.0)
Monocytes Absolute: 1.3 10*3/uL — ABNORMAL HIGH (ref 0.1–1.0)
Monocytes Relative: 10 %
Neutro Abs: 3.1 10*3/uL (ref 1.7–7.7)
Neutrophils Relative %: 23 %
Platelet Count: 201 10*3/uL (ref 150–400)
RBC: 4.61 MIL/uL (ref 4.22–5.81)
RDW: 18.6 % — ABNORMAL HIGH (ref 11.5–15.5)
Smear Review: NORMAL
WBC Count: 13.6 10*3/uL — ABNORMAL HIGH (ref 4.0–10.5)
nRBC: 0 % (ref 0.0–0.2)

## 2023-07-06 LAB — POCT I-STAT CREATININE: Creatinine, Ser: 1.4 mg/dL — ABNORMAL HIGH (ref 0.61–1.24)

## 2023-07-06 LAB — SAMPLE TO BLOOD BANK

## 2023-07-06 LAB — LACTATE DEHYDROGENASE: LDH: 124 U/L (ref 98–192)

## 2023-07-06 MED ORDER — IOHEXOL 300 MG/ML  SOLN
80.0000 mL | Freq: Once | INTRAMUSCULAR | Status: AC | PRN
  Administered 2023-07-06: 80 mL via INTRAVENOUS

## 2023-07-08 ENCOUNTER — Other Ambulatory Visit: Payer: Self-pay

## 2023-07-12 ENCOUNTER — Other Ambulatory Visit (HOSPITAL_COMMUNITY): Payer: Self-pay

## 2023-07-12 ENCOUNTER — Inpatient Hospital Stay (HOSPITAL_BASED_OUTPATIENT_CLINIC_OR_DEPARTMENT_OTHER): Payer: Medicare HMO | Admitting: Oncology

## 2023-07-12 ENCOUNTER — Encounter: Payer: Self-pay | Admitting: Oncology

## 2023-07-12 ENCOUNTER — Other Ambulatory Visit: Payer: Self-pay

## 2023-07-12 VITALS — BP 121/72 | HR 51 | Temp 96.2°F | Resp 18 | Wt 134.9 lb

## 2023-07-12 DIAGNOSIS — Z5111 Encounter for antineoplastic chemotherapy: Secondary | ICD-10-CM

## 2023-07-12 DIAGNOSIS — C9110 Chronic lymphocytic leukemia of B-cell type not having achieved remission: Secondary | ICD-10-CM

## 2023-07-12 DIAGNOSIS — Z87891 Personal history of nicotine dependence: Secondary | ICD-10-CM | POA: Diagnosis not present

## 2023-07-12 DIAGNOSIS — Z720 Tobacco use: Secondary | ICD-10-CM | POA: Diagnosis not present

## 2023-07-12 DIAGNOSIS — D649 Anemia, unspecified: Secondary | ICD-10-CM | POA: Diagnosis not present

## 2023-07-12 MED ORDER — BRUKINSA 80 MG PO CAPS
160.0000 mg | ORAL_CAPSULE | Freq: Two times a day (BID) | ORAL | 2 refills | Status: DC
Start: 2023-07-12 — End: 2023-10-11
  Filled 2023-07-12 – 2023-07-13 (×2): qty 120, 30d supply, fill #0
  Filled 2023-08-20: qty 120, 30d supply, fill #1

## 2023-07-12 NOTE — Assessment & Plan Note (Signed)
Recommend smoke cessation.  

## 2023-07-12 NOTE — Assessment & Plan Note (Signed)
Secondary to CLL with bone marrow involvement Hb has improved

## 2023-07-12 NOTE — Progress Notes (Signed)
Hematology/Oncology Progress note [phone] [phone]   Chief Complaint: [name] is a 72 y.o. male presents to follow up for chronic lymphocytic lymphoma.   ASSESSMENT & PLAN:   Cancer Staging  Chronic lymphocytic leukemia (CLL), B-cell (HCC) Staging form: Chronic Lymphocytic Leukemia / Small Lymphocytic Lymphoma, AJCC 8th Edition - Clinical stage from 07/23/2017: Modified Rai Stage III (Modified Rai risk: High, Lymphocytosis: Present, Adenopathy: Present, Organomegaly: Absent, Anemia: Present, Thrombocytopenia: Absent) - Signed by [name], MD on 02/19/2022   Chronic lymphocytic leukemia (CLL), B-cell (HCC) Recurrent CLL, trisomy 12 and TP53 mutated, IgVH Somatic Hypermutation unmutated He did not Acalabrutinib due to diarrhea. He prefers not to try reduced dose, S/p Rituximab weekly x 4, CT showed progression--> Abdominal mass biopsy confirmed CLL, no evidence of transformation Labs reviewed and discussed with patient. Wbc is trending down Overall he tolerates Zanubrutinib 160 mg twice daily.  CT showed excellent treatment response.  Recommend patient to continue current regimen.   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    Follow-up  2 months lab MD    All questions were answered. The patient knows to call the clinic with any problems, questions or concerns.  [name], MD, PhD [facility] Hematology Oncology 07/12/2023     HPI:   Oncology History  Chronic lymphocytic leukemia (CLL), B-cell (HCC)  09/13/2014 Initial Diagnosis   CLL - hemolytic anemia in 01/2014.  He was treated with prednisone. 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  -Oct 2020 second opinion at [facility] and was evaluated by heme-onc Dr.Ellis.  Patient was recommended for watchful waiting   10/02/2014 - 10/23/2014 Chemotherapy   -10/02/2014- 10/23/2014  4 weekly cycles of Rituxan  -07/23/2017 Decision was made to start Ibrutinib as patient is very symptomatic (fatigue and weight loss, lack of appetite). Patient did not show up at the chemo education class   05/08/2022 Imaging   CT CHEST, ABDOMEN, AND PELVIS WITH CONTRAST 1 unchanged enlarged bilateral axillary lymph nodes and prominent pretracheal mediastinal lymph nodes. 2. However, when compared to most recent prior CT examination of the abdomen and pelvis dated 06/24/2016, there are numerous, extremely bulky lymph nodes throughout the abdomen and pelvis, which are greatly increased in size. Newly enlarged left inguinal lymph nodes, in keeping with reported palpable lymphadenopathy. 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 3. Mild right hydronephrosis; the proximal ureter appears compressed or obstructed by overlying lymphadenopathy. 4. Emphysema.5. Coronary artery disease.   05/30/2022 Imaging   PET scan showed  1. Again seen is massive nodal conglomeration within the abdomen with mildly diffuse increased tracer uptake with SUV max of 3.15.There also enlarged bilateral pelvic and left inguinal lymph nodes which exhibit mild increased tracer uptake compatible with residual/recurrent lymphoma. Imaging findings are compatible with residual/recurrent metabolically active low-grade lymphoma. 2. No significant tracer uptake associated with the borderline enlarged bilateral axillary lymph nodes.3. Unchanged appearance of small nonspecific subpleural nodules within the periphery of the upper lobes measuring up to 4 mm. These are too small to characterize by PET-CT.4. Aortic Atherosclerosis (ICD10-I70.0) and Emphysema (ICD10-J43.9). 5. Coronary artery calcifications. 6. Normal size spleen with  background activity similar to the liver   06/22/2022 -  Chemotherapy   acalabrutinib 100  mg twice daily.   07/27/2022 - 08/20/2022 Chemotherapy   Patient is on Treatment Plan : ITP Rituximab q7d x 4 cycles     11/09/2022 Imaging   CT abdomen pelvis w contrast  1. Progressive bulky retroperitoneal and mesenteric lymphadenopathy consistent with progressive lymphoma. 2. No evidence of bowel obstruction, perforation or abscess. 3. Stable small amount of free pelvic fluid. 4. Aortic Atherosclerosis (ICD10-I70.0). Chronically occluded right femoral arterial stent    11/09/2022 [facility] Admission   Patient presented to emergency room for evaluation of shortness of breath.  He has noticed progressive worsening of abdominal swelling and distention.  Associated with shortness of breath.  Patient was started on diuretics.     11/24/2022 Procedure   CT guided biopsy of abdomen mass  Pathology showed CLL/SLL Core biopsy sections demonstrate lymphoid tissue with partially effaced follicular architecture.  The bulk of the tissue consists of sheets of small lymphocytes with dense chromatin and scant cytoplasm.  There is no significant increase in larger prolymphocytic type cells.  Relatively few small germinal centers are appreciated.  Concurrent flow cytometric studies demonstrate a CD5+, CD23 + monoclonal B-cell population comprising 87% of total lymphoid cells and 100% of B cells.  The phenotype is compatible with chronic lymphocytic leukemia/small lymphocytic lymphoma.  There are no features in the current sample to suggest progression to higher grade process. Clinical and radiographic correlation is recommended.    12/09/2022 -  Chemotherapy   Zanubrutinib 160 mg twice daily     INTERVAL HISTORY 72 y.o. male with history of CLL presents for follow-up. Patient has been on Zanubrutinib 160 mg twice daily since He has gained weight since last visit. Denies any nausea vomiting diarrhea, denies  abdominal pain.  Not taking her other medications.     Past Medical History:  Diagnosis Date   Anemia 2008   Arthritis    Cervicalgia    Chronic lymphocytic leukemia (CLL), B-cell (HCC) 06/15/2015   Dr. Cathie Hoops   Hyperlipidemia    Low back pain    Peripheral vascular disease [facility])     Past Surgical History:  Procedure Laterality Date   COLONOSCOPY     COLONOSCOPY WITH PROPOFOL N/A 07/23/2015   Procedure: COLONOSCOPY WITH PROPOFOL;  Surgeon: Earline Mayotte, MD;  Location: [facility] ENDOSCOPY;  Service: Endoscopy;  Laterality: N/A;   COLONOSCOPY WITH PROPOFOL N/A 04/20/2022   Procedure: COLONOSCOPY WITH PROPOFOL;  Surgeon: Wyline Mood, MD;  Location: [facility] ENDOSCOPY;  Service: Gastroenterology;  Laterality: N/A;   ESOPHAGOGASTRODUODENOSCOPY (EGD) WITH PROPOFOL N/A 08/20/2015   Procedure: ESOPHAGOGASTRODUODENOSCOPY (EGD) WITH PROPOFOL;  Surgeon: Earline Mayotte, MD;  Location: [facility] ENDOSCOPY;  Service: Endoscopy;  Laterality: N/A;   LOWER EXTREMITY ANGIOGRAPHY Left 05/19/2018   Procedure: LOWER EXTREMITY ANGIOGRAPHY;  Surgeon: Annice Needy, MD;  Location: [facility] INVASIVE CV LAB;  Service: Cardiovascular;  Laterality: Left;   LOWER EXTREMITY ANGIOGRAPHY Right 06/15/2018   Procedure: LOWER EXTREMITY ANGIOGRAPHY;  Surgeon: Annice Needy, MD;  Location: [facility] INVASIVE CV LAB;  Service: Cardiovascular;  Laterality: Right;   lymp node removal Right 2011   arm   left arm 2015   LYMPH NODE BIOPSY  08/2014    Family History  Problem Relation Age of Onset   Heart attack Father    Kidney cancer Neg Hx        lung cancer   Bladder Cancer Neg Hx    Prostate cancer Neg Hx     Social History:  reports that he quit smoking  about a year ago. His smoking use included cigars. He has never used smokeless tobacco. He reports that he does not drink alcohol and does not use drugs.  Allergies:  Allergies  Allergen Reactions   Penicillins     Other reaction(s): Rash, Hives ()   Latex Rash   Review of Systems:   Review of Systems  Constitutional:  Negative for chills, fever, malaise/fatigue and weight loss.  HENT:  Negative for ear discharge, ear pain, nosebleeds and sore throat.   Eyes:  Negative for photophobia, pain and redness.  Respiratory:  Negative for cough, hemoptysis, sputum production, shortness of breath and wheezing.   Cardiovascular:  Negative for chest pain, palpitations and leg swelling.  Gastrointestinal:  Negative for abdominal pain, blood in stool, constipation, diarrhea, heartburn, nausea and vomiting.  Genitourinary:  Negative for dysuria and frequency.  Musculoskeletal:  Negative for myalgias and neck pain.  Skin:  Negative for rash.  Neurological:  Negative for dizziness, tingling, tremors, weakness and headaches.  Endo/Heme/Allergies:  Does not bruise/bleed easily.  Psychiatric/Behavioral:  Negative for depression, hallucinations and suicidal ideas.      Current Outpatient Medications on File Prior to Visit  Medication Sig Dispense Refill   aspirin EC 81 MG tablet Take 81 mg by mouth daily. Swallow whole. 90 tablet 3   azelastine (ASTELIN) 0.1 % nasal spray Place 1 spray into both nostrils 2 (two) times daily. Use in each nostril as directed 30 mL 2   guaiFENesin (MUCINEX) 600 MG 12 hr tablet Take 2 tablets (1,200 mg total) by mouth 2 (two) times daily. 28 tablet 3   pantoprazole (PROTONIX) 40 MG tablet Take 1 tablet (40 mg total) by mouth daily. Prior to breakfast, 30 min to 1 hr 90 tablet 3   rosuvastatin (CRESTOR) 20 MG tablet Take 1 tablet (20 mg total) by mouth daily. 90 tablet 3   tamsulosin (FLOMAX) 0.4 MG CAPS capsule Take 1 capsule (0.4 mg total) by mouth daily. Give 30 90 capsule 3   traZODone (DESYREL) 50 MG tablet Take 0.5-1 tablets (25-50 mg total) by mouth at bedtime. 90 tablet 3   umeclidinium-vilanterol (ANORO ELLIPTA) 62.5-25 MCG/ACT AEPB Inhale 1 puff into the lungs daily. 30 each 11   No current facility-administered medications on file prior to visit.     Physical Exam: Blood pressure 121/72, pulse (!) 51, temperature (!) 96.2 F (35.7 C), temperature source Tympanic, resp. rate 18, weight 134 lb 14.4 oz (61.2 kg), SpO2 99%. Physical Exam Constitutional:      General: He is not in acute distress.    Appearance: He is not diaphoretic.  HENT:     Head: Normocephalic.     Nose: Nose normal.     Mouth/Throat:     Pharynx: No oropharyngeal exudate.  Eyes:     General: No scleral icterus.    Pupils: Pupils are equal, round, and reactive to light.  Cardiovascular:     Rate and Rhythm: Normal rate and regular rhythm.  Pulmonary:     Effort: Pulmonary effort is normal. No respiratory distress.     Breath sounds: No wheezing.  Abdominal:     General: Bowel sounds are normal. There is no distension.     Palpations: Abdomen is soft. There is no mass.     Tenderness: There is no abdominal tenderness.  Genitourinary:    Comments:   Musculoskeletal:        General: Normal range of motion.     Cervical back: Normal range  of motion.  Skin:    General: Skin is warm and dry.     Findings: No erythema.  Neurological:     Mental Status: He is alert and oriented to person, place, and time. Mental status is at baseline.     Cranial Nerves: No cranial nerve deficit.     Motor: No abnormal muscle tone.  Psychiatric:        Mood and Affect: Mood and affect normal.    LABORATORY RESULTS.    Latest Ref Rng & Units 07/06/2023    9:20 AM 04/08/2023    9:31 AM 03/03/2023    9:55 AM  CBC  WBC 4.0 - 10.5 K/uL 13.6  20.1  42.0   Hemoglobin 13.0 - 17.0 g/dL 16.1  09.6  04.5   Hematocrit 39.0 - 52.0 % 36.6  38.1  35.4   Platelets 150 - 400 K/uL 201  165  180       Latest Ref Rng & Units 07/06/2023    9:20 AM 07/06/2023    8:53 AM 04/08/2023    9:31 AM  CMP  Glucose 70 - 99 mg/dL 409   811   BUN 8 - 23 mg/dL 12   10   Creatinine 9.14 - 1.24 mg/dL 7.82  9.56  2.13   Sodium 135 - 145 mmol/L 134   137   Potassium 3.5 - 5.1 mmol/L 4.0   4.4    Chloride 98 - 111 mmol/L 104   106   CO2 22 - 32 mmol/L 24   24   Calcium 8.9 - 10.3 mg/dL 8.3   8.8   Total Protein 6.5 - 8.1 g/dL 6.3   6.6   Total Bilirubin 0.3 - 1.2 mg/dL 0.5   0.6   Alkaline Phos 38 - 126 U/L 45   52   AST 15 - 41 U/L 21   21   ALT 0 - 44 U/L 14   15      PATHOLOGY:  11/02/2016 Peripheral blood FISH studies revealed 20% of nuclei positive for loss of 1 ATM signal, 63% of nuclei positive for trisomy 12, and 32% of nuclei positive for loss of 1 TP53 signal. Results for CCND1/IGH and 13q were normal.  09/13/2014 Left axillary node biopsy on  confirmed B-cell small lymphocytic lymphoma (B-CLL/SLL)   IMAGE STUDIES CT CHEST ABDOMEN PELVIS W CONTRAST  Result Date: 07/08/2023 CLINICAL DATA:  Chronic lymphocytic leukemia. B-cell. * Tracking Code: BO * EXAM: CT CHEST, ABDOMEN, AND PELVIS WITH CONTRAST TECHNIQUE: Multidetector CT imaging of the chest, abdomen and pelvis was performed following the standard protocol during bolus administration of intravenous contrast. RADIATION DOSE REDUCTION: This exam was performed according to the departmental dose-optimization program which includes automated exposure control, adjustment of the mA and/or kV according to patient size and/or use of iterative reconstruction technique. CONTRAST:  80mL OMNIPAQUE IOHEXOL 300 MG/ML  SOLN COMPARISON:  11/09/2022 abdominopelvic CT.  PET 05/28/2022 FINDINGS: CT CHEST FINDINGS Cardiovascular: Aortic atherosclerosis. No hydronephrosis. Normal heart size, without pericardial effusion. Lad and right coronary artery calcification. No central pulmonary embolism, on this non-dedicated study. Mediastinum/Nodes: No supraclavicular adenopathy. Hyperattenuation within the right axilla is favored to be related to prior node dissection. No axillary adenopathy. No mediastinal or hilar adenopathy. Lungs/Pleura: No pleural fluid. Centrilobular and paraseptal emphysema. Subpleural left upper lobe pulmonary nodules including  a 3 mm on 71/4 are similar to the prior PET and considered benign, likely subpleural lymph nodes. Musculoskeletal: No acute osseous abnormality. CT  ABDOMEN PELVIS FINDINGS Hepatobiliary: Normal liver. Normal gallbladder, without biliary ductal dilatation. Pancreas: Pancreatic duct is mildly dilated in the head at 4 mm on 63/2. Followed to the level of the ampulla, without obstructive cause identified. Spleen: Normal in size, without focal abnormality. Adrenals/Urinary Tract: Normal adrenal glands. Normal kidneys, without hydronephrosis. Normal urinary bladder. Stomach/Bowel: Normal stomach, without wall thickening. Normal terminal ileum and appendix. Normal small bowel caliber. Vascular/Lymphatic: Aortic atherosclerosis. Again identified are bilateral femoral artery stents which are thrombosed. Significant improvement in abdominal retroperitoneal adenopathy. An index pericaval node measures 1.7 x 2.2 cm on 70/2 versus 5.6 x 4.6 cm when remeasured on 11/09/2022 CT. Small bowel mesenteric adenopathy is also markedly improved. Index nodes measure maximally 2.3 x 2.6 cm on 79/2, at the site of a nodal conglomerate of up to 17.5 x 11.6 cm when remeasured at a similar level on the prior. Pelvic sidewall adenopathy, with an index right external iliac node measuring 1.4 cm on 101/2 versus 1.8 cm on the prior (when remeasured). Left external iliac node measures 1.8 cm on 103/2 versus 2.0 cm on the prior (when remeasured). Reproductive: Normal prostate for age. Other: No significant free fluid. Musculoskeletal: No acute osseous abnormality. Degenerative disc disease at the lumbosacral junction. IMPRESSION: 1. Marked response to therapy of abdominal and mild-to-moderate response to therapy of pelvic adenopathy since 11/09/2022 abdominopelvic CT. 2. No evidence of active lymphoma within the chest. 3. Incidental findings, including: Aortic atherosclerosis (ICD10-I70.0), coronary artery atherosclerosis and emphysema  (ICD10-J43.9). Electronically Signed   By: Jeronimo Greaves M.D.   On: 07/08/2023 14:15

## 2023-07-12 NOTE — Assessment & Plan Note (Signed)
Recurrent CLL, trisomy 12 and TP53 mutated, IgVH Somatic Hypermutation unmutated He did not Acalabrutinib due to diarrhea. He prefers not to try reduced dose, S/p Rituximab weekly x 4, CT showed progression--> Abdominal mass biopsy confirmed CLL, no evidence of transformation Labs reviewed and discussed with patient. Wbc is trending down Overall he tolerates Zanubrutinib 160 mg twice daily.  CT showed excellent treatment response.  Recommend patient to continue current regimen.

## 2023-07-12 NOTE — Assessment & Plan Note (Signed)
CLL treatment as listed above 

## 2023-07-13 ENCOUNTER — Other Ambulatory Visit: Payer: Self-pay

## 2023-07-13 ENCOUNTER — Other Ambulatory Visit (HOSPITAL_COMMUNITY): Payer: Self-pay

## 2023-07-21 ENCOUNTER — Encounter (INDEPENDENT_AMBULATORY_CARE_PROVIDER_SITE_OTHER): Payer: Self-pay

## 2023-07-21 DIAGNOSIS — Z03818 Encounter for observation for suspected exposure to other biological agents ruled out: Secondary | ICD-10-CM | POA: Diagnosis not present

## 2023-08-04 ENCOUNTER — Other Ambulatory Visit (HOSPITAL_COMMUNITY): Payer: Self-pay

## 2023-08-04 ENCOUNTER — Encounter: Payer: Self-pay | Admitting: Emergency Medicine

## 2023-08-04 DIAGNOSIS — M542 Cervicalgia: Secondary | ICD-10-CM | POA: Insufficient documentation

## 2023-08-05 ENCOUNTER — Encounter: Payer: Self-pay | Admitting: Family

## 2023-08-05 ENCOUNTER — Ambulatory Visit (INDEPENDENT_AMBULATORY_CARE_PROVIDER_SITE_OTHER): Payer: Medicare HMO | Admitting: Emergency Medicine

## 2023-08-05 VITALS — Ht 68.0 in | Wt 135.0 lb

## 2023-08-05 DIAGNOSIS — Z0000 Encounter for general adult medical examination without abnormal findings: Secondary | ICD-10-CM

## 2023-08-05 NOTE — Patient Instructions (Addendum)
[name] , Thank you for taking time to come for your Medicare Wellness Visit. I appreciate your ongoing commitment to your health goals. Please review the following plan we discussed and let me know if I can assist you in the future.   Referrals/Orders/Follow-Ups/Clinician Recommendations: Recommend getting the flu shot this fall and covid shot when available In 6-8 weeks. Also, recommend getting the shingles vaccine and another pneumonia vaccine.  This is a list of the screening recommended for you and due dates:  Health Maintenance  Topic Date Due   Zoster (Shingles) Vaccine (1 of 2) Never done   Pneumonia Vaccine (2 of 2 - PPSV23 or PCV20) 12/05/2018   COVID-19 Vaccine (3 - Pfizer risk series) 04/17/2020   Flu Shot  07/22/2023   Medicare Annual Wellness Visit  08/04/2024   Colon Cancer Screening  04/20/2025   DTaP/Tdap/Td vaccine (3 - Td or Tdap) 04/01/2028   Hepatitis C Screening  Completed   HPV Vaccine  Aged Out    Advanced directives: (ACP Link)Information on Advanced Care Planning can be found at [facility] Advance Health Care Directives Advance Health Care Directives ([email or web address]   Next Medicare Annual Wellness Visit scheduled for next year: Yes, 08/10/24 @ 12:45pm  Preventive Care 65 Years and Older, Male  Preventive care refers to lifestyle choices and visits with your health care provider that can promote health and wellness. What does preventive care include? A yearly physical exam. This is also called an annual well check. Dental exams once or twice a year. Routine eye exams. Ask your health care provider how often you should have your eyes checked. Personal lifestyle choices, including: Daily care of your teeth and gums. Regular physical activity. Eating a healthy diet. Avoiding tobacco and drug use. Limiting alcohol use. Practicing safe sex. Taking low doses of aspirin every day. Taking vitamin and mineral supplements as recommended by your  health care provider. What happens during an annual well check? The services and screenings done by your health care provider during your annual well check will depend on your age, overall health, lifestyle risk factors, and family history of disease. Counseling  Your health care provider may ask you questions about your: Alcohol use. Tobacco use. Drug use. Emotional well-being. Home and relationship well-being. Sexual activity. Eating habits. History of falls. Memory and ability to understand (cognition). Work and work Astronomer. Screening  You may have the following tests or measurements: Height, weight, and BMI. Blood pressure. Lipid and cholesterol levels. These may be checked every 5 years, or more frequently if you are over 26 years old. Skin check. Lung cancer screening. You may have this screening every year starting at age 62 if you have a 30-pack-year history of smoking and currently smoke or have quit within the past 15 years. Fecal occult blood test (FOBT) of the stool. You may have this test every year starting at age 67. Flexible sigmoidoscopy or colonoscopy. You may have a sigmoidoscopy every 5 years or a colonoscopy every 10 years starting at age 53. Prostate cancer screening. Recommendations will vary depending on your family history and other risks. Hepatitis C blood test. Hepatitis B blood test. Sexually transmitted disease (STD) testing. Diabetes screening. This is done by checking your blood sugar (glucose) after you have not eaten for a while (fasting). You may have this done every 1-3 years. Abdominal aortic aneurysm (AAA) screening. You may need this if you are a current or former smoker. Osteoporosis. You may be screened starting  at age 41 if you are at high risk. Talk with your health care provider about your test results, treatment options, and if necessary, the need for more tests. Vaccines  Your health care provider may recommend certain vaccines, such  as: Influenza vaccine. This is recommended every year. Tetanus, diphtheria, and acellular pertussis (Tdap, Td) vaccine. You may need a Td booster every 10 years. Zoster vaccine. You may need this after age 90. Pneumococcal 13-valent conjugate (PCV13) vaccine. One dose is recommended after age 9. Pneumococcal polysaccharide (PPSV23) vaccine. One dose is recommended after age 51. Talk to your health care provider about which screenings and vaccines you need and how often you need them. This information is not intended to replace advice given to you by your health care provider. Make sure you discuss any questions you have with your health care provider. Document Released: 01/03/2016 Document Revised: 08/26/2016 Document Reviewed: 10/08/2015 Elsevier Interactive Patient Education  2017 [employer].  Fall Prevention in the Home Falls can cause injuries. They can happen to people of all ages. There are many things you can do to make your home safe and to help prevent falls. What can I do on the outside of my home? Regularly fix the edges of walkways and driveways and fix any cracks. Remove anything that might make you trip as you walk through a door, such as a raised step or threshold. Trim any bushes or trees on the path to your home. Use bright outdoor lighting. Clear any walking paths of anything that might make someone trip, such as rocks or tools. Regularly check to see if handrails are loose or broken. Make sure that both sides of any steps have handrails. Any raised decks and porches should have guardrails on the edges. Have any leaves, snow, or ice cleared regularly. Use sand or salt on walking paths during winter. Clean up any spills in your garage right away. This includes oil or grease spills. What can I do in the bathroom? Use night lights. Install grab bars by the toilet and in the tub and shower. Do not use towel bars as grab bars. Use non-skid mats or decals in the tub or  shower. If you need to sit down in the shower, use a plastic, non-slip stool. Keep the floor dry. Clean up any water that spills on the floor as soon as it happens. Remove soap buildup in the tub or shower regularly. Attach bath mats securely with double-sided non-slip rug tape. Do not have throw rugs and other things on the floor that can make you trip. What can I do in the bedroom? Use night lights. Make sure that you have a light by your bed that is easy to reach. Do not use any sheets or blankets that are too big for your bed. They should not hang down onto the floor. Have a firm chair that has side arms. You can use this for support while you get dressed. Do not have throw rugs and other things on the floor that can make you trip. What can I do in the kitchen? Clean up any spills right away. Avoid walking on wet floors. Keep items that you use a lot in easy-to-reach places. If you need to reach something above you, use a strong step stool that has a grab bar. Keep electrical cords out of the way. Do not use floor polish or wax that makes floors slippery. If you must use wax, use non-skid floor wax. Do not have throw rugs  and other things on the floor that can make you trip. What can I do with my stairs? Do not leave any items on the stairs. Make sure that there are handrails on both sides of the stairs and use them. Fix handrails that are broken or loose. Make sure that handrails are as long as the stairways. Check any carpeting to make sure that it is firmly attached to the stairs. Fix any carpet that is loose or worn. Avoid having throw rugs at the top or bottom of the stairs. If you do have throw rugs, attach them to the floor with carpet tape. Make sure that you have a light switch at the top of the stairs and the bottom of the stairs. If you do not have them, ask someone to add them for you. What else can I do to help prevent falls? Wear shoes that: Do not have high heels. Have  rubber bottoms. Are comfortable and fit you well. Are closed at the toe. Do not wear sandals. If you use a stepladder: Make sure that it is fully opened. Do not climb a closed stepladder. Make sure that both sides of the stepladder are locked into place. Ask someone to hold it for you, if possible. Clearly mark and make sure that you can see: Any grab bars or handrails. First and last steps. Where the edge of each step is. Use tools that help you move around (mobility aids) if they are needed. These include: Canes. Walkers. Scooters. Crutches. Turn on the lights when you go into a dark area. Replace any light bulbs as soon as they burn out. Set up your furniture so you have a clear path. Avoid moving your furniture around. If any of your floors are uneven, fix them. If there are any pets around you, be aware of where they are. Review your medicines with your doctor. Some medicines can make you feel dizzy. This can increase your chance of falling. Ask your doctor what other things that you can do to help prevent falls. This information is not intended to replace advice given to you by your health care provider. Make sure you discuss any questions you have with your health care provider. Document Released: 10/03/2009 Document Revised: 05/14/2016 Document Reviewed: 01/11/2015 Elsevier Interactive Patient Education  2017 [employer].

## 2023-08-05 NOTE — Progress Notes (Addendum)
Subjective:   [name] is a 72 y.o. male who presents for Medicare Annual/Subsequent preventive examination.  Visit Complete: Virtual  I connected with  [name] on 08/05/23 by a audio enabled telemedicine application and verified that I am speaking with the correct person using two identifiers.  Patient Location: Home  Provider Location: Home Office  I discussed the limitations of evaluation and management by telemedicine. The patient expressed understanding and agreed to proceed.  Vital Signs: Because this visit was a virtual/telehealth visit, some criteria may be missing or patient reported. Any vitals not documented were not able to be obtained and vitals that have been documented are patient reported.     Review of Systems     Cardiac Risk Factors include: advanced age (>47men, >2 women);male gender;dyslipidemia;smoking/ tobacco exposure;Other (see comment), Risk factor comments: Coronary atherosclerosis, PVD     Objective:    Today's Vitals   08/05/23 1242  Weight: 135 lb (61.2 kg)  Height: 5\' 8"  (1.727 m)   Body mass index is 20.53 kg/m.     08/05/2023   12:59 PM 04/08/2023    9:42 AM 01/19/2023   10:03 AM 12/02/2022    3:45 PM 11/24/2022   12:22 PM 11/18/2022    1:22 PM 11/09/2022    2:23 PM  Advanced Directives  Does Patient Have a Medical Advance Directive? No No No No  No No  Would patient like information on creating a medical advance directive? Yes (MAU/Ambulatory/Procedural Areas - Information given) No - Patient declined No - Patient declined No - Patient declined No - Patient declined No - Patient declined No - Patient declined    Current Medications (verified) Outpatient Encounter Medications as of 08/05/2023  Medication Sig   aspirin EC 81 MG tablet Take 81 mg by mouth daily. Swallow whole.   pantoprazole (PROTONIX) 40 MG tablet Take 1 tablet (40 mg total) by mouth daily. Prior to breakfast, 30 min to 1 hr   tamsulosin (FLOMAX) 0.4 MG CAPS  capsule Take 1 capsule (0.4 mg total) by mouth daily. Give 30   traZODone (DESYREL) 50 MG tablet Take 0.5-1 tablets (25-50 mg total) by mouth at bedtime.   umeclidinium-vilanterol (ANORO ELLIPTA) 62.5-25 MCG/ACT AEPB Inhale 1 puff into the lungs daily.   zanubrutinib (BRUKINSA) 80 MG capsule Take 2 capsules (160 mg total) by mouth 2 (two) times daily.   azelastine (ASTELIN) 0.1 % nasal spray Place 1 spray into both nostrils 2 (two) times daily. Use in each nostril as directed (Patient not taking: Reported on 08/05/2023)   guaiFENesin (MUCINEX) 600 MG 12 hr tablet Take 2 tablets (1,200 mg total) by mouth 2 (two) times daily. (Patient not taking: Reported on 08/05/2023)   rosuvastatin (CRESTOR) 20 MG tablet Take 1 tablet (20 mg total) by mouth daily. (Patient not taking: Reported on 08/05/2023)   No facility-administered encounter medications on file as of 08/05/2023.    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 Cathie Hoops   Hyperlipidemia    Low back pain    Peripheral vascular disease [facility])    Past Surgical History:  Procedure Laterality Date   COLONOSCOPY     COLONOSCOPY WITH PROPOFOL N/A 07/23/2015   Procedure: COLONOSCOPY WITH PROPOFOL;  Surgeon: Earline Mayotte, MD;  Location[facility] ENDOSCOPY;  Service: Endoscopy;  Laterality: N/A;  COLONOSCOPY WITH PROPOFOL N/A 04/20/2022   Procedure: COLONOSCOPY WITH PROPOFOL;  Surgeon: Wyline Mood, MD;  Location: [facility] ENDOSCOPY;  Service: Gastroenterology;  Laterality: N/A;   ESOPHAGOGASTRODUODENOSCOPY (EGD) WITH PROPOFOL N/A 08/20/2015   Procedure: ESOPHAGOGASTRODUODENOSCOPY (EGD) WITH PROPOFOL;  Surgeon: Earline Mayotte, MD;  Location: [facility] ENDOSCOPY;  Service: Endoscopy;  Laterality: N/A;   LOWER EXTREMITY ANGIOGRAPHY Left 05/19/2018   Procedure: LOWER EXTREMITY ANGIOGRAPHY;  Surgeon: Annice Needy, MD;  Location: [facility] INVASIVE CV LAB;  Service: Cardiovascular;  Laterality: Left;   LOWER EXTREMITY ANGIOGRAPHY Right 06/15/2018   Procedure: LOWER EXTREMITY ANGIOGRAPHY;  Surgeon: Annice Needy, MD;  Location: [facility] INVASIVE CV LAB;  Service: Cardiovascular;  Laterality: Right;   lymp node removal Right 2011   arm   left arm 2015   LYMPH NODE BIOPSY  08/2014   Family History  Problem Relation Age of Onset   Alcohol abuse Mother    Cirrhosis Mother    Heart attack Father    Kidney cancer Neg Hx        lung cancer   Bladder Cancer Neg Hx    Prostate cancer Neg Hx    Social History   Socioeconomic History   Marital status: Married    Spouse name: [name]   Number of children: 6   Years of education: Not on file   Highest education level: Not on file  Occupational History   Occupation: part time Printmaker  Tobacco Use   Smoking status: Every Day    Types: Cigars   Smokeless tobacco: Never   Tobacco comments:    .5 ppd last year, total of 48.5 pack year    Smokes 1 cigar (black & milds) per day  Vaping Use   Vaping status: Never Used  Substance and Sexual Activity   Alcohol use: No   Drug use: No   Sexual activity: Yes  Other Topics Concern   Not on file  Social History Narrative   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    Let go from [facility] 11/2019 unexpectedly      6 children ( 3 girls, 3 boys and one sons is adopted)  and 10 grandchildren   Social Determinants of Health   Financial Resource Strain: Medium Risk (08/05/2023)   Overall Financial Resource Strain (CARDIA)    Difficulty of Paying Living Expenses: Somewhat hard  Food Insecurity: No Food Insecurity (08/05/2023)   Hunger Vital Sign    Worried About Running Out of Food in the Last Year: Never true    Ran Out of Food in the Last Year: Never true  Transportation Needs: No Transportation Needs (08/05/2023)   PRAPARE - Administrator, Civil Service (Medical): No    Lack of  Transportation (Non-Medical): No  Physical Activity: Insufficiently Active (08/05/2023)   Exercise Vital Sign    Days of Exercise per Week: 7 days    Minutes of Exercise per Session: 20 min  Stress: No Stress Concern Present (08/05/2023)   [employer] of Occupational Health - Occupational Stress Questionnaire    Feeling of Stress : Not at all  Social Connections: Socially Integrated (08/05/2023)   Social Connection and Isolation Panel [NHANES]    Frequency of Communication with Friends and Family: Three times a week    Frequency of Social Gatherings with Friends and Family: More than three times a week    Attends Religious Services: More than 4 times per year    Active Member of  Clubs or Organizations: Yes    Attends Engineer, structural: More than 4 times per year    Marital Status: Married    Tobacco Counseling Ready to quit: Not Answered Counseling given: Not Answered Tobacco comments: .5 ppd last year, total of 48.5 pack year Smokes 1 cigar (black & milds) per day   Clinical Intake:  Pre-visit preparation completed: Yes  Pain : No/denies pain     BMI - recorded: 20.53 Nutritional Status: BMI of 19-24  Normal Nutritional Risks: None Diabetes: No  How often do you need to have someone help you when you read instructions, pamphlets, or other written materials from your doctor or pharmacy?: 1 - Never  Interpreter Needed?: No  Information entered by :: Tora Kindred, CMA   Activities of Daily Living    08/05/2023   12:44 PM 11/24/2022   12:21 PM  In your present state of health, do you have any difficulty performing the following activities:  Hearing? 1 0  Comment wears hearing aids   Vision? 0 0  Difficulty concentrating or making decisions? 0 0  Walking or climbing stairs? 0 0  Dressing or bathing? 0 0  Doing errands, shopping? 0   Preparing Food and eating ? N   Using the Toilet? N   In the past six months, have you accidently leaked urine? Y    Do you have problems with loss of bowel control? Y   Managing your Medications? N   Managing your Finances? N   Housekeeping or managing your Housekeeping? N     Patient Care Team: Allegra Grana, FNP as PCP - General (Family Medicine) Mariah Milling Tollie Pizza, MD as PCP - Cardiology (Cardiology) Byrnett, Merrily Pew, MD (General Surgery) Johney Maine, MD (Unknown Physician Specialty) [name], MD as Consulting Physician (Oncology)  Indicate any recent Medical Services you may have received from other than [facility] providers in the past year (date may be approximate).     Assessment:   This is a routine wellness examination for Nilson.  Hearing/Vision screen Hearing Screening - Comments:: Wears hearing aids Vision Screening - Comments:: Gets eye exams  Dietary issues and exercise activities discussed:     Goals Addressed               This Visit's Progress     COMPLETED: Maintain Healthy Lifestyle        Stay active Healthy diet      Patient Stated (pt-stated)        Drink more water and take care of his health      Depression Screen    08/05/2023   12:56 PM 04/20/2023   11:15 AM 11/09/2022   10:47 AM 09/15/2022    9:49 AM 08/03/2022   12:41 PM 07/22/2022   11:41 AM 07/22/2022   11:25 AM  PHQ 2/9 Scores  PHQ - 2 Score 0 1 1 0 0 0 0  PHQ- 9 Score 0 9 2 1        Fall Risk    08/05/2023    1:00 PM 04/20/2023   11:14 AM 11/09/2022   10:47 AM 09/15/2022    9:49 AM 08/03/2022   12:43 PM  Fall Risk   Falls in the past year? 1 0 0 0 0  Number falls in past yr: 0 0 0 0 0  Injury with Fall? 0 0 0 0   Risk for fall due to : History of fall(s) No Fall Risks No Fall Risks No Fall  Risks   Follow up Falls prevention discussed;Falls evaluation completed Falls evaluation completed Falls evaluation completed Falls evaluation completed Falls evaluation completed    MEDICARE RISK AT HOME:  Medicare Risk at Home - 08/05/23 1300     Any stairs in or around the home? Yes    If so,  are there any without handrails? No    Home free of loose throw rugs in walkways, pet beds, electrical cords, etc? Yes    Adequate lighting in your home to reduce risk of falls? Yes    Life alert? Yes    Use of a cane, walker or w/c? No    Grab bars in the bathroom? Yes    Shower chair or bench in shower? Yes    Elevated toilet seat or a handicapped toilet? No             TIMED UP AND GO:  Was the test performed?  No    Cognitive Function:        08/05/2023    1:02 PM  6CIT Screen  What Year? 4 points  What month? 0 points  What time? 0 points  Count back from 20 0 points  Months in reverse 0 points  Repeat phrase 0 points  Total Score 4 points    Immunizations Immunization History  Administered Date(s) Administered   Fluad Quad(high Dose 65+) 11/30/2019, 10/22/2020, 09/15/2022   Influenza,inj,Quad PF,6+ Mos 10/08/2017, 10/10/2018   PFIZER(Purple Top)SARS-COV-2 Vaccination 02/19/2020, 03/20/2020   Pneumococcal Conjugate-13 10/10/2018   Tdap 02/13/2009, 04/01/2018    TDAP status: Up to date  Flu Vaccine status: Due, Education has been provided regarding the importance of this vaccine. Advised may receive this vaccine at local pharmacy or Health Dept. Aware to provide a copy of the vaccination record if obtained from local pharmacy or Health Dept. Verbalized acceptance and understanding.  Pneumococcal vaccine status: Due, Education has been provided regarding the importance of this vaccine. Advised may receive this vaccine at local pharmacy or Health Dept. Aware to provide a copy of the vaccination record if obtained from local pharmacy or Health Dept. Verbalized acceptance and understanding.  Covid-19 vaccine status: Information provided on how to obtain vaccines.   Qualifies for Shingles Vaccine? Yes   Zostavax completed No   Shingrix Completed?: No.    Education has been provided regarding the importance of this vaccine. Patient has been advised to call  insurance company to determine out of pocket expense if they have not yet received this vaccine. Advised may also receive vaccine at local pharmacy or Health Dept. Verbalized acceptance and understanding.  Screening Tests Health Maintenance  Topic Date Due   Zoster Vaccines- Shingrix (1 of 2) Never done   Pneumonia Vaccine 81+ Years old (2 of 2 - PPSV23 or PCV20) 12/05/2018   COVID-19 Vaccine (3 - Pfizer risk series) 04/17/2020   INFLUENZA VACCINE  07/22/2023   Medicare Annual Wellness (AWV)  08/04/2024   Colonoscopy  04/20/2025   DTaP/Tdap/Td (3 - Td or Tdap) 04/01/2028   Hepatitis C Screening  Completed   HPV VACCINES  Aged Out    Health Maintenance  Health Maintenance Due  Topic Date Due   Zoster Vaccines- Shingrix (1 of 2) Never done   Pneumonia Vaccine 51+ Years old (2 of 2 - PPSV23 or PCV20) 12/05/2018   COVID-19 Vaccine (3 - Pfizer risk series) 04/17/2020   INFLUENZA VACCINE  07/22/2023    Colorectal cancer screening: Type of screening: Colonoscopy. Completed 04/20/22. Repeat every 3  years  Lung Cancer Screening: (Low Dose CT Chest recommended if Age 18-80 years, 20 pack-year currently smoking OR have quit w/in 15years.) does not qualify.   Lung Cancer Screening Referral: n/a  Additional Screening:  Hepatitis C Screening: does not qualify; Completed 07/20/22  Vision Screening: Recommended annual ophthalmology exams for early detection of glaucoma and other disorders of the eye.  Dental Screening: Recommended annual dental exams for proper oral hygiene    Community Resource Referral / Chronic Care Management: CRR required this visit?  No   CCM required this visit?  No     Plan:     I have personally reviewed and noted the following in the patient's chart:   Medical and social history Use of alcohol, tobacco or illicit drugs  Current medications and supplements including opioid prescriptions. Patient is not currently taking opioid prescriptions. Functional  ability and status Nutritional status Physical activity Advanced directives List of other physicians Hospitalizations, surgeries, and ER visits in previous 12 months Vitals Screenings to include cognitive, depression, and falls Referrals and appointments  In addition, I have reviewed and discussed with patient certain preventive protocols, quality metrics, and best practice recommendations. A written personalized care plan for preventive services as well as general preventive health recommendations were provided to patient.     Tora Kindred, CMA   08/05/2023   After Visit Summary: (MyChart) Due to this being a telephonic visit, the after visit summary with patients personalized plan was offered to patient via MyChart   Nurse Notes:  6 CIT score 4 Patient requesting referral to vein & vascular. He states it has been over 3 years since he's seen them. Patient will get flu shot in the fall and covid booster when available. Patient needs shingles and pneumonia vaccines.   I have reviewed the above information and agree with above.   Duncan Dull, MD

## 2023-08-06 ENCOUNTER — Other Ambulatory Visit (HOSPITAL_COMMUNITY): Payer: Self-pay

## 2023-08-09 ENCOUNTER — Encounter (HOSPITAL_COMMUNITY): Payer: Self-pay

## 2023-08-09 ENCOUNTER — Other Ambulatory Visit (HOSPITAL_COMMUNITY): Payer: Self-pay

## 2023-08-20 ENCOUNTER — Other Ambulatory Visit (HOSPITAL_COMMUNITY): Payer: Self-pay

## 2023-09-02 ENCOUNTER — Ambulatory Visit: Payer: Medicare HMO | Admitting: Student in an Organized Health Care Education/Training Program

## 2023-09-08 ENCOUNTER — Encounter: Payer: Self-pay | Admitting: Family

## 2023-09-08 ENCOUNTER — Ambulatory Visit (INDEPENDENT_AMBULATORY_CARE_PROVIDER_SITE_OTHER): Payer: Medicare HMO | Admitting: Family

## 2023-09-08 VITALS — BP 128/76 | HR 78 | Temp 98.6°F | Ht 68.0 in | Wt 133.0 lb

## 2023-09-08 DIAGNOSIS — Z23 Encounter for immunization: Secondary | ICD-10-CM

## 2023-09-08 DIAGNOSIS — N400 Enlarged prostate without lower urinary tract symptoms: Secondary | ICD-10-CM

## 2023-09-08 DIAGNOSIS — R5383 Other fatigue: Secondary | ICD-10-CM

## 2023-09-08 DIAGNOSIS — I739 Peripheral vascular disease, unspecified: Secondary | ICD-10-CM | POA: Diagnosis not present

## 2023-09-08 DIAGNOSIS — R3911 Hesitancy of micturition: Secondary | ICD-10-CM

## 2023-09-08 DIAGNOSIS — M79604 Pain in right leg: Secondary | ICD-10-CM

## 2023-09-08 DIAGNOSIS — M79605 Pain in left leg: Secondary | ICD-10-CM | POA: Diagnosis not present

## 2023-09-08 LAB — URINALYSIS, ROUTINE W REFLEX MICROSCOPIC
Ketones, ur: NEGATIVE
Leukocytes,Ua: NEGATIVE
Nitrite: NEGATIVE
Specific Gravity, Urine: 1.025 (ref 1.000–1.030)
Total Protein, Urine: NEGATIVE
Urine Glucose: NEGATIVE
Urobilinogen, UA: 0.2 (ref 0.0–1.0)
WBC, UA: NONE SEEN (ref 0–?)
pH: 6 (ref 5.0–8.0)

## 2023-09-08 LAB — TSH: TSH: 1.76 u[IU]/mL (ref 0.35–5.50)

## 2023-09-08 LAB — PSA: PSA: 1.42 ng/mL (ref 0.10–4.00)

## 2023-09-08 MED ORDER — TAMSULOSIN HCL 0.4 MG PO CAPS: 0.8000 mg | ORAL_CAPSULE | Freq: Every day | ORAL | 3 refills | Status: AC

## 2023-09-08 NOTE — Progress Notes (Unsigned)
Assessment & Plan:  There are no diagnoses linked to this encounter.   Return precautions given.   Risks, benefits, and alternatives of the medications and treatment plan prescribed today were discussed, and patient expressed understanding.   Education regarding symptom management and diagnosis given to patient on AVS either electronically or printed.  No follow-ups on file.  Rennie Plowman, FNP  Subjective:    Patient ID: [name], male    DOB: [birth year], 72 y.o.   MRN: [id number]  CC: [name] is a 72 y.o. male who presents today for follow up.   HPI: Bilateral calf pain with walking.   Resolves with rest. Endorses a cramping sensation.   No swelling, numbness in legs, groin pain, urinary and fecal incontinence.  He has low back stiffness every morning. 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  At times, he feels there is a sense of hesitancy.  He feels he is voiding completely.  Urinary frequency has decreased over time.  Denies hematuria   Allergies: Penicillins and Latex Current Outpatient Medications on File Prior to Visit  Medication Sig Dispense Refill  . aspirin EC 81 MG tablet Take 81 mg by mouth daily. Swallow whole. 90 tablet 3  . azelastine (ASTELIN) 0.1 % nasal spray Place 1 spray into both nostrils 2 (two) times daily. Use in each nostril as directed (Patient not taking: Reported on 08/05/2023) 30 mL 2  . guaiFENesin (MUCINEX) 600 MG 12 hr tablet Take 2 tablets (1,200 mg total) by mouth 2 (two) times daily. (Patient not taking: Reported on 08/05/2023) 28 tablet 3  . pantoprazole (PROTONIX) 40 MG tablet Take 1 tablet (40 mg total) by mouth daily. Prior to breakfast, 30 min to 1 hr 90 tablet 3   . rosuvastatin (CRESTOR) 20 MG tablet Take 1 tablet (20 mg total) by mouth daily. (Patient not taking: Reported on 08/05/2023) 90 tablet 3  . tamsulosin (FLOMAX) 0.4 MG CAPS capsule Take 1 capsule (0.4 mg total) by mouth daily. Give 30 90 capsule 3  . traZODone (DESYREL) 50 MG tablet Take 0.5-1 tablets (25-50 mg total) by mouth at bedtime. 90 tablet 3  . umeclidinium-vilanterol (ANORO ELLIPTA) 62.5-25 MCG/ACT AEPB Inhale 1 puff into the lungs daily. 30 each 11  . zanubrutinib (BRUKINSA) 80 MG capsule Take 2 capsules (160 mg total) by mouth 2 (two) times daily. 120 capsule 2   No current facility-administered medications on file prior to visit.    Review of Systems  Constitutional:  Negative for chills and fever.  Respiratory:  Negative for cough.   Cardiovascular:  Negative for chest pain and palpitations.  Gastrointestinal:  Negative for nausea and vomiting.      Objective:    There were no vitals taken for this visit. BP Readings from Last 3 Encounters:  07/12/23 121/72  04/20/23 132/76  04/08/23 132/70   Wt Readings from Last 3 Encounters:  08/05/23 135 lb (61.2 kg)  07/12/23 134 lb 14.4 oz (61.2 kg)  04/20/23 135 lb (61.2 kg)    Physical Exam

## 2023-09-09 ENCOUNTER — Other Ambulatory Visit (HOSPITAL_COMMUNITY): Payer: Self-pay

## 2023-09-09 NOTE — Assessment & Plan Note (Signed)
Concern for symptomatic intermittent claudication, particular in the setting of history of bilateral stents and smoking.  Pending ABI, and referral to vascular.

## 2023-09-09 NOTE — Assessment & Plan Note (Signed)
Suboptimal control.  Increase Flomax 0.8 mg total daily.  Pending urinalysis, PSA

## 2023-09-09 NOTE — Patient Instructions (Signed)
I increased Flomax to 0.8 mg.  Please let me know if you still experience urinary hesitancy   I have ordered an ultrasound of your legs to be done at the vascular office.  I have also placed a referral back to [facility]  Let us know if you dont hear back within a week in regards to an appointment being scheduled.   So that you are aware, if you are [facility] MyChart user , please pay attention to your MyChart messages as you may receive a MyChart message with a phone number to call and schedule this test/appointment own your own from our referral coordinator. This is a new process so I do not want you to miss this message.  If you are not a MyChart user, you will receive a phone call.

## 2023-09-15 ENCOUNTER — Other Ambulatory Visit: Payer: Self-pay

## 2023-09-16 ENCOUNTER — Encounter: Payer: Self-pay | Admitting: *Deleted

## 2023-09-16 ENCOUNTER — Other Ambulatory Visit (HOSPITAL_COMMUNITY): Payer: Self-pay

## 2023-09-20 ENCOUNTER — Other Ambulatory Visit: Payer: Self-pay

## 2023-09-23 ENCOUNTER — Other Ambulatory Visit: Payer: Self-pay

## 2023-10-11 ENCOUNTER — Inpatient Hospital Stay: Payer: Medicare HMO | Attending: Oncology

## 2023-10-11 ENCOUNTER — Inpatient Hospital Stay (HOSPITAL_BASED_OUTPATIENT_CLINIC_OR_DEPARTMENT_OTHER): Payer: Medicare HMO | Admitting: Oncology

## 2023-10-11 ENCOUNTER — Other Ambulatory Visit (HOSPITAL_COMMUNITY): Payer: Self-pay

## 2023-10-11 ENCOUNTER — Encounter: Payer: Self-pay | Admitting: Oncology

## 2023-10-11 ENCOUNTER — Other Ambulatory Visit: Payer: Self-pay

## 2023-10-11 VITALS — BP 128/76 | HR 48 | Temp 97.5°F | Resp 18 | Wt 138.8 lb

## 2023-10-11 DIAGNOSIS — D649 Anemia, unspecified: Secondary | ICD-10-CM | POA: Diagnosis not present

## 2023-10-11 DIAGNOSIS — Z720 Tobacco use: Secondary | ICD-10-CM | POA: Diagnosis not present

## 2023-10-11 DIAGNOSIS — C9110 Chronic lymphocytic leukemia of B-cell type not having achieved remission: Secondary | ICD-10-CM | POA: Diagnosis not present

## 2023-10-11 DIAGNOSIS — Z5111 Encounter for antineoplastic chemotherapy: Secondary | ICD-10-CM | POA: Diagnosis not present

## 2023-10-11 DIAGNOSIS — Z9221 Personal history of antineoplastic chemotherapy: Secondary | ICD-10-CM | POA: Insufficient documentation

## 2023-10-11 LAB — CBC WITH DIFFERENTIAL (CANCER CENTER ONLY)
Abs Immature Granulocytes: 0.46 10*3/uL — ABNORMAL HIGH (ref 0.00–0.07)
Basophils Absolute: 0.1 10*3/uL (ref 0.0–0.1)
Basophils Relative: 1 %
Eosinophils Absolute: 0.2 10*3/uL (ref 0.0–0.5)
Eosinophils Relative: 3 %
HCT: 38.2 % — ABNORMAL LOW (ref 39.0–52.0)
Hemoglobin: 12 g/dL — ABNORMAL LOW (ref 13.0–17.0)
Immature Granulocytes: 5 %
Lymphocytes Relative: 39 %
Lymphs Abs: 3.5 10*3/uL (ref 0.7–4.0)
MCH: 24.8 pg — ABNORMAL LOW (ref 26.0–34.0)
MCHC: 31.4 g/dL (ref 30.0–36.0)
MCV: 79.1 fL — ABNORMAL LOW (ref 80.0–100.0)
Monocytes Absolute: 1.1 10*3/uL — ABNORMAL HIGH (ref 0.1–1.0)
Monocytes Relative: 12 %
Neutro Abs: 3.8 10*3/uL (ref 1.7–7.7)
Neutrophils Relative %: 40 %
Platelet Count: 160 10*3/uL (ref 150–400)
RBC: 4.83 MIL/uL (ref 4.22–5.81)
RDW: 18 % — ABNORMAL HIGH (ref 11.5–15.5)
Smear Review: NORMAL
WBC Count: 9.1 10*3/uL (ref 4.0–10.5)
nRBC: 0 % (ref 0.0–0.2)

## 2023-10-11 LAB — CMP (CANCER CENTER ONLY)
ALT: 15 U/L (ref 0–44)
AST: 17 U/L (ref 15–41)
Albumin: 4 g/dL (ref 3.5–5.0)
Alkaline Phosphatase: 40 U/L (ref 38–126)
Anion gap: 7 (ref 5–15)
BUN: 14 mg/dL (ref 8–23)
CO2: 25 mmol/L (ref 22–32)
Calcium: 8.6 mg/dL — ABNORMAL LOW (ref 8.9–10.3)
Chloride: 105 mmol/L (ref 98–111)
Creatinine: 0.98 mg/dL (ref 0.61–1.24)
GFR, Estimated: >60 mL/min (ref >60)
Glucose, Bld: 108 mg/dL — ABNORMAL HIGH (ref 70–99)
Potassium: 4.5 mmol/L (ref 3.5–5.1)
Sodium: 137 mmol/L (ref 135–145)
Total Bilirubin: 0.6 mg/dL (ref 0.3–1.2)
Total Protein: 6.2 g/dL — ABNORMAL LOW (ref 6.5–8.1)

## 2023-10-11 LAB — LACTATE DEHYDROGENASE: LDH: 139 U/L (ref 98–192)

## 2023-10-11 LAB — RETIC PANEL
Immature Retic Fract: 14 % (ref 2.3–15.9)
RBC.: 4.81 MIL/uL (ref 4.22–5.81)
Retic Count, Absolute: 100.5 10*3/uL (ref 19.0–186.0)
Retic Ct Pct: 2.1 % (ref 0.4–3.1)
Reticulocyte Hemoglobin: 26.3 pg — ABNORMAL LOW (ref >27.9)

## 2023-10-11 MED ORDER — BRUKINSA 80 MG PO CAPS
160.0000 mg | ORAL_CAPSULE | Freq: Two times a day (BID) | ORAL | 3 refills | Status: DC
  Filled 2023-10-11: qty 120, 30d supply, fill #0
  Filled 2023-12-06: qty 120, 30d supply, fill #1
  Filled 2023-12-29: qty 120, 30d supply, fill #2

## 2023-10-11 MED ORDER — BRUKINSA 80 MG PO CAPS: 160.0000 mg | ORAL_CAPSULE | Freq: Two times a day (BID) | ORAL | 2 refills | Status: DC

## 2023-10-11 NOTE — Assessment & Plan Note (Signed)
Recurrent CLL, trisomy 12 and TP53 mutated, IgVH Somatic Hypermutation unmutated He did not Acalabrutinib due to diarrhea. He prefers not to try reduced dose, S/p Rituximab weekly x 4, CT showed progression--> Abdominal mass biopsy confirmed CLL, no evidence of transformation Labs reviewed and discussed with patient. Wbc is trending down Overall he tolerates Zanubrutinib 160 mg twice daily.  CT showed excellent treatment response.  Recommend patient to continue current regimen.

## 2023-10-11 NOTE — Assessment & Plan Note (Signed)
Secondary to CLL with bone marrow involvement Hb has improved

## 2023-10-11 NOTE — Assessment & Plan Note (Signed)
Recommend smoke cessation.  

## 2023-10-11 NOTE — Assessment & Plan Note (Signed)
CLL treatment as listed above 

## 2023-10-11 NOTE — Progress Notes (Signed)
Pt here for follow up. He is requesting refill on Brukinsa.

## 2023-10-11 NOTE — Progress Notes (Signed)
Specialty Pharmacy Refill Coordination Note  [name] is a 72 y.o. male contacted today regarding refills of specialty medication(s) Zanubrutinib   Patient requested Delivery   Delivery date: 10/13/23   Verified address: [address] [place] [place], [place] [address]   Medication will be filled on 10/12/23.   Ardeen Fillers, CPhT Oncology Pharmacy Patient Advocate  [facility]  [phone] (phone) [phone] (fax) 10/11/2023 11:41 AM

## 2023-10-11 NOTE — Progress Notes (Signed)
Hematology/Oncology Progress note [phone] [phone]   Chief Complaint: [name] is a 72 y.o. male presents to follow up for chronic lymphocytic lymphoma.   ASSESSMENT & PLAN:   Cancer Staging  Chronic lymphocytic leukemia (CLL), B-cell (HCC) Staging form: Chronic Lymphocytic Leukemia / Small Lymphocytic Lymphoma, AJCC 8th Edition - Clinical stage from 07/23/2017: Modified Rai Stage III (Modified Rai risk: High, Lymphocytosis: Present, Adenopathy: Present, Organomegaly: Absent, Anemia: Present, Thrombocytopenia: Absent) - Signed by [name], MD on 02/19/2022   Chronic lymphocytic leukemia (CLL), B-cell (HCC) Recurrent CLL, trisomy 12 and TP53 mutated, IgVH Somatic Hypermutation unmutated He did not Acalabrutinib due to diarrhea. He prefers not to try reduced dose, S/p Rituximab weekly x 4, CT showed progression--> Abdominal mass biopsy confirmed CLL, no evidence of transformation Labs reviewed and discussed with patient. Wbc is trending down Overall he tolerates Zanubrutinib 160 mg twice daily.  CT showed excellent treatment response.  Recommend patient to continue current regimen.   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   Follow-up  3 months lab MD    All questions were answered. The patient knows to call the clinic with any problems, questions or concerns.  [name], MD, PhD [facility] Hematology Oncology 10/11/2023     HPI:   Oncology History  Chronic lymphocytic leukemia (CLL), B-cell (HCC)  09/13/2014 Initial Diagnosis   CLL - hemolytic anemia in 01/2014.  He was treated with prednisone. 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  -Oct 2020 second opinion at [facility] and was evaluated by heme-onc Dr.Ellis.  Patient was recommended for watchful waiting   10/02/2014 - 10/23/2014 Chemotherapy   -10/02/2014- 10/23/2014  4 weekly cycles of Rituxan  -07/23/2017 Decision was made to start Ibrutinib as patient is very symptomatic (fatigue and weight loss, lack of appetite). Patient did not show up at the chemo education class   05/08/2022 Imaging   CT CHEST, ABDOMEN, AND PELVIS WITH CONTRAST 1 unchanged enlarged bilateral axillary lymph nodes and prominent pretracheal mediastinal lymph nodes. 2. However, when compared to most recent prior CT examination of the abdomen and pelvis dated 06/24/2016, there are numerous, extremely bulky lymph nodes throughout the abdomen and pelvis, which are greatly increased in size. Newly enlarged left inguinal lymph nodes, in keeping with reported palpable lymphadenopathy. 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 3. Mild right hydronephrosis; the proximal ureter appears compressed or obstructed by overlying lymphadenopathy. 4. Emphysema.5. Coronary artery disease.   05/30/2022 Imaging   PET scan showed  1. Again seen is massive nodal conglomeration within the abdomen with mildly diffuse increased tracer uptake with SUV max of 3.15.There also enlarged bilateral pelvic and left inguinal lymph nodes which exhibit mild increased tracer uptake compatible with residual/recurrent lymphoma. Imaging findings are compatible with residual/recurrent metabolically active low-grade lymphoma. 2. No significant tracer uptake associated with the borderline enlarged bilateral axillary lymph nodes.3. Unchanged appearance of small nonspecific subpleural nodules within the periphery of the upper lobes measuring up to 4 mm. These are too small to characterize by PET-CT.4. Aortic Atherosclerosis (ICD10-I70.0) and Emphysema (ICD10-J43.9). 5. Coronary artery calcifications. 6. Normal size spleen with  background activity similar to the liver   06/22/2022 -  Chemotherapy   acalabrutinib 100 mg  twice daily.   07/27/2022 - 08/20/2022 Chemotherapy   Patient is on Treatment Plan : ITP Rituximab q7d x 4 cycles     11/09/2022 Imaging   CT abdomen pelvis w contrast  1. Progressive bulky retroperitoneal and mesenteric lymphadenopathy consistent with progressive lymphoma. 2. No evidence of bowel obstruction, perforation or abscess. 3. Stable small amount of free pelvic fluid. 4. Aortic Atherosclerosis (ICD10-I70.0). Chronically occluded right femoral arterial stent    11/09/2022 [facility] Admission   Patient presented to emergency room for evaluation of shortness of breath.  He has noticed progressive worsening of abdominal swelling and distention.  Associated with shortness of breath.  Patient was started on diuretics.     11/24/2022 Procedure   CT guided biopsy of abdomen mass  Pathology showed CLL/SLL Core biopsy sections demonstrate lymphoid tissue with partially effaced follicular architecture.  The bulk of the tissue consists of sheets of small lymphocytes with dense chromatin and scant cytoplasm.  There is no significant increase in larger prolymphocytic type cells.  Relatively few small germinal centers are appreciated.  Concurrent flow cytometric studies demonstrate a CD5+, CD23 + monoclonal B-cell population comprising 87% of total lymphoid cells and 100% of B cells.  The phenotype is compatible with chronic lymphocytic leukemia/small lymphocytic lymphoma.  There are no features in the current sample to suggest progression to higher grade process. Clinical and radiographic correlation is recommended.    12/09/2022 -  Chemotherapy   Zanubrutinib 160 mg twice daily     INTERVAL HISTORY 72 y.o. male with history of CLL presents for follow-up. Patient has been on Zanubrutinib 160 mg twice daily since He has gained weight since last visit.  Denies any nausea vomiting diarrhea, denies  abdominal pain.    Past Medical History:  Diagnosis Date   Anemia 2008   Arthritis    Cervicalgia    Chronic lymphocytic leukemia (CLL), B-cell (HCC) 06/15/2015   Dr. Cathie Hoops   Hyperlipidemia    Low back pain    Peripheral vascular disease [facility])     Past Surgical History:  Procedure Laterality Date   COLONOSCOPY     COLONOSCOPY WITH PROPOFOL N/A 07/23/2015   Procedure: COLONOSCOPY WITH PROPOFOL;  Surgeon: Earline Mayotte, MD;  Location: [facility] ENDOSCOPY;  Service: Endoscopy;  Laterality: N/A;   COLONOSCOPY WITH PROPOFOL N/A 04/20/2022   Procedure: COLONOSCOPY WITH PROPOFOL;  Surgeon: Wyline Mood, MD;  Location: [facility] ENDOSCOPY;  Service: Gastroenterology;  Laterality: N/A;   ESOPHAGOGASTRODUODENOSCOPY (EGD) WITH PROPOFOL N/A 08/20/2015   Procedure: ESOPHAGOGASTRODUODENOSCOPY (EGD) WITH PROPOFOL;  Surgeon: Earline Mayotte, MD;  Location: [facility] ENDOSCOPY;  Service: Endoscopy;  Laterality: N/A;   LOWER EXTREMITY ANGIOGRAPHY Left 05/19/2018   Procedure: LOWER EXTREMITY ANGIOGRAPHY;  Surgeon: Annice Needy, MD;  Location: [facility] INVASIVE CV LAB;  Service: Cardiovascular;  Laterality: Left;   LOWER EXTREMITY ANGIOGRAPHY Right 06/15/2018   Procedure: LOWER EXTREMITY ANGIOGRAPHY;  Surgeon: Annice Needy, MD;  Location: [facility] INVASIVE CV LAB;  Service: Cardiovascular;  Laterality: Right;   lymp node removal Right 2011   arm   left arm 2015   LYMPH NODE BIOPSY  08/2014    Family History  Problem Relation Age of Onset   Alcohol abuse Mother    Cirrhosis Mother    Heart attack Father    Kidney cancer Neg Hx        lung cancer   Bladder Cancer Neg Hx    Prostate cancer Neg Hx     Social History:  reports  that he has been smoking cigars. He has never used smokeless tobacco. He reports that he does not drink alcohol and does not use drugs.  Allergies:  Allergies  Allergen Reactions   Penicillins     Other reaction(s): Rash, Hives ()   Latex Rash   Review of Systems:  Review of Systems   Constitutional:  Negative for chills, fever, malaise/fatigue and weight loss.  HENT:  Negative for ear discharge, ear pain, nosebleeds and sore throat.   Eyes:  Negative for photophobia, pain and redness.  Respiratory:  Negative for cough, hemoptysis, sputum production, shortness of breath and wheezing.   Cardiovascular:  Negative for chest pain, palpitations and leg swelling.  Gastrointestinal:  Negative for abdominal pain, blood in stool, constipation, diarrhea, heartburn, nausea and vomiting.  Genitourinary:  Negative for dysuria and frequency.  Musculoskeletal:  Negative for myalgias and neck pain.  Skin:  Negative for rash.  Neurological:  Negative for dizziness, tingling, tremors, weakness and headaches.  Endo/Heme/Allergies:  Does not bruise/bleed easily.  Psychiatric/Behavioral:  Negative for depression, hallucinations and suicidal ideas.      Current Outpatient Medications on File Prior to Visit  Medication Sig Dispense Refill   aspirin EC 81 MG tablet Take 81 mg by mouth daily. Swallow whole. 90 tablet 3   guaiFENesin (MUCINEX) 600 MG 12 hr tablet Take 2 tablets (1,200 mg total) by mouth 2 (two) times daily. 28 tablet 3   pantoprazole (PROTONIX) 40 MG tablet Take 1 tablet (40 mg total) by mouth daily. Prior to breakfast, 30 min to 1 hr 90 tablet 3   tamsulosin (FLOMAX) 0.4 MG CAPS capsule Take 2 capsules (0.8 mg total) by mouth daily. 180 capsule 3   umeclidinium-vilanterol (ANORO ELLIPTA) 62.5-25 MCG/ACT AEPB Inhale 1 puff into the lungs daily. 30 each 11   rosuvastatin (CRESTOR) 20 MG tablet Take 1 tablet (20 mg total) by mouth daily. (Patient not taking: Reported on 08/05/2023) 90 tablet 3   traZODone (DESYREL) 50 MG tablet Take 0.5-1 tablets (25-50 mg total) by mouth at bedtime. (Patient not taking: Reported on 10/11/2023) 90 tablet 3   No current facility-administered medications on file prior to visit.    Physical Exam: Blood pressure 128/76, pulse (!) 48, temperature  (!) 97.5 F (36.4 C), resp. rate 18, weight 138 lb 12.8 oz (63 kg), SpO2 100%. Physical Exam Constitutional:      General: He is not in acute distress.    Appearance: He is not diaphoretic.  HENT:     Head: Normocephalic.  Eyes:     General: No scleral icterus. Cardiovascular:     Rate and Rhythm: Normal rate and regular rhythm.  Pulmonary:     Effort: Pulmonary effort is normal. No respiratory distress.     Breath sounds: No wheezing.  Abdominal:     General: Bowel sounds are normal. There is no distension.     Palpations: Abdomen is soft. There is no mass.  Genitourinary:    Comments:   Musculoskeletal:        General: Normal range of motion.     Cervical back: Normal range of motion.  Skin:    General: Skin is warm and dry.     Findings: No erythema.  Neurological:     Mental Status: He is alert and oriented to person, place, and time. Mental status is at baseline.     Cranial Nerves: No cranial nerve deficit.     Motor: No abnormal muscle tone.  Psychiatric:  Mood and Affect: Mood and affect normal.     LABORATORY RESULTS.    Latest Ref Rng & Units 10/11/2023   10:43 AM 07/06/2023    9:20 AM 04/08/2023    9:31 AM  CBC  WBC 4.0 - 10.5 K/uL 9.1  13.6  20.1   Hemoglobin 13.0 - 17.0 g/dL 54.2  70.6  23.7   Hematocrit 39.0 - 52.0 % 38.2  36.6  38.1   Platelets 150 - 400 K/uL 160  201  165       Latest Ref Rng & Units 10/11/2023   10:44 AM 07/06/2023    9:20 AM 07/06/2023    8:53 AM  CMP  Glucose 70 - 99 mg/dL 628  315    BUN 8 - 23 mg/dL 14  12    Creatinine 1.76 - 1.24 mg/dL 1.60  7.37  1.06   Sodium 135 - 145 mmol/L 137  134    Potassium 3.5 - 5.1 mmol/L 4.5  4.0    Chloride 98 - 111 mmol/L 105  104    CO2 22 - 32 mmol/L 25  24    Calcium 8.9 - 10.3 mg/dL 8.6  8.3    Total Protein 6.5 - 8.1 g/dL 6.2  6.3    Total Bilirubin 0.3 - 1.2 mg/dL 0.6  0.5    Alkaline Phos 38 - 126 U/L 40  45    AST 15 - 41 U/L 17  21    ALT 0 - 44 U/L 15  14        PATHOLOGY:  11/02/2016 Peripheral blood FISH studies revealed 20% of nuclei positive for loss of 1 ATM signal, 63% of nuclei positive for trisomy 12, and 32% of nuclei positive for loss of 1 TP53 signal. Results for CCND1/IGH and 13q were normal.  09/13/2014 Left axillary node biopsy on  confirmed B-cell small lymphocytic lymphoma (B-CLL/SLL)   IMAGE STUDIES No results found.

## 2023-10-13 ENCOUNTER — Other Ambulatory Visit (HOSPITAL_COMMUNITY): Payer: Self-pay

## 2023-10-29 ENCOUNTER — Other Ambulatory Visit: Payer: Self-pay

## 2023-11-02 ENCOUNTER — Other Ambulatory Visit (HOSPITAL_COMMUNITY): Payer: Self-pay

## 2023-11-08 ENCOUNTER — Other Ambulatory Visit: Payer: Self-pay

## 2023-11-10 ENCOUNTER — Other Ambulatory Visit: Payer: Self-pay

## 2023-12-06 ENCOUNTER — Other Ambulatory Visit: Payer: Self-pay

## 2023-12-06 NOTE — Progress Notes (Signed)
Specialty Pharmacy Ongoing Clinical Assessment Note  [name] is a 72 y.o. male who is being followed by the specialty pharmacy service for RxSp Oncology   Patient's specialty medication(s) reviewed today: Zanubrutinib (Brukinsa)   Missed doses in the last 4 weeks: 0   Patient/Caregiver did not have any additional questions or concerns.   Therapeutic benefit summary: Patient is achieving benefit   Adverse events/side effects summary: No adverse events/side effects   Patient's therapy is appropriate to: Continue    Goals Addressed             This Visit's Progress    Slow Disease Progression       Patient is on track. Patient will maintain adherence. Per provider recent note CT scans show excellent response to Brukinsa.          Follow up:  6 months  Otto Herb Specialty Pharmacist

## 2023-12-06 NOTE — Progress Notes (Signed)
Specialty Pharmacy Refill Coordination Note  [name] is a 72 y.o. male contacted today regarding refills of specialty medication(s) Zanubrutinib [facility])   Patient requested Delivery   Delivery date: 12/07/23   Verified address: [address] [place] [place], [place] [address]   Medication will be filled on 12/06/23.

## 2023-12-24 ENCOUNTER — Other Ambulatory Visit: Payer: Self-pay

## 2023-12-26 ENCOUNTER — Telehealth: Payer: Self-pay

## 2023-12-26 ENCOUNTER — Other Ambulatory Visit (HOSPITAL_COMMUNITY): Payer: Self-pay

## 2023-12-26 NOTE — Telephone Encounter (Signed)
 Oral Oncology Patient Advocate Encounter  Was successful in securing patient a $8,000.00 grant from [facility] to provide copayment coverage for Brukinsa .  This will keep the out of pocket expense at $0.     [employer] ID: [id number]   The billing information is as follows and has been shared with [facility].    RxBin: [id number] PCN: [id number] Member ID: [id number] Group ID: [id number] Dates of Eligibility: 10/16/23 through 10/14/24  Fund:  Chronic Lymphocytic Leukemia   Morene Potters, CPhT Oncology Pharmacy Patient Advocate  [facility]  [phone] (phone) [phone] (fax)

## 2023-12-27 ENCOUNTER — Other Ambulatory Visit: Payer: Self-pay

## 2023-12-28 ENCOUNTER — Ambulatory Visit: Payer: Self-pay | Admitting: Family

## 2023-12-28 NOTE — Telephone Encounter (Signed)
 Spoke with patient still asymptomatic now did advise symptoms return or worsening to seek medical attention sooner. E2C2 scheduled as virtual I have made in office 20 minute appt. See triage note. Was scheduled for 12/30/23 moved Dr. Maribeth tomorrow at 9:20 AM.

## 2023-12-28 NOTE — Telephone Encounter (Addendum)
 Reason for Disposition  [1] MILD dizziness (e.g., walking normally) AND [2] has NOT been evaluated by doctor (or NP/PA) for this  (Exception: Dizziness caused by heat exposure, sudden standing, or poor fluid intake.)  Answer Assessment - Initial Assessment Questions 1. DESCRIPTION: Describe how you are feeling.     Felt weakness in legs as if legs were going to give out earlier. Feeling okay at this time  2. SEVERITY: How bad is it?  Can you stand and walk?   - MILD (0-3): Feels weak or tired, but does not interfere with work, school or normal activities.   - MODERATE (4-7): Able to stand and walk; weakness interferes with work, school, or normal activities.   - SEVERE (8-10): Unable to stand or walk; unable to do usual activities.     Can walk normally now  3. ONSET: When did these symptoms begin? (e.g., hours, days, weeks, months)     15-20 minutes ago  4. CAUSE: What do you think is causing the weakness or fatigue? (e.g., not drinking enough fluids, medical problem, trouble sleeping)     Unknown  Answer Assessment - Initial Assessment Questions 1. DESCRIPTION: Describe your dizziness.     Felt dizzy about 15-20 min ago when standing and legs felt weak  2. SEVERITY: How bad is it?  Do you feel like you are going to faint? Can you stand and walk?   - MILD: Feels slightly dizzy, but walking normally.   - MODERATE: Feels unsteady when walking, but not falling; interferes with normal activities (e.g., school, work).   - SEVERE: Unable to walk without falling, or requires assistance to walk without falling; feels like passing out now.      Patient is able to stand and walk right now  3. ONSET:  When did the dizziness begin?     15- 20 min ago  4. CAUSE: What do you think is causing the dizziness?     Unknown  5. RECURRENT SYMPTOM: Have you had dizziness before? If Yes, ask: When was the last time? What happened that time?     Yes. Patient stated he felt  this about one year and he stated that he has anemia  6. OTHER SYMPTOMS: Do you have any other symptoms? (e.g., fever, chest pain, vomiting, diarrhea, bleeding)       Cold chills  Protocols used: Weakness (Generalized) and Fatigue-A-AH, Dizziness - Lightheadedness-A-AH     Chief Complaint: Weakness in legs and dizziness, cold chills  Disposition: [] ED /[] Urgent Care (no appt availability in office) / [x] Appointment(In office/virtual)/ []  [facility]/ [] Home Care/ [] Refused Recommended Disposition /[] [facility]/ []  Follow-up with PCP  Additional Notes: Patient stated that he felt dizziness and weakness in his legs about 15-20 min ago. He felt like his legs were going to give out. Now, he is sitting down and stated that he is feeling better. Patient mentioned that he has anemia and he wants to schedule an appointment. Patient reported that is able to walk okay right now and the dizzy/weak feeling has went away at the moment. Appointment scheduled for 1/9. Instructed patient to call back with any new or worsening symptoms.

## 2023-12-28 NOTE — Telephone Encounter (Deleted)
 Copied from CRM [id number]. Topic: Clinical - Red Word Triage >> Dec 28, 2023  2:15 PM Chantha C wrote: Red Word that prompted transfer to Nurse Triage: Patient [phone] states started today c/o light headness like about to pass out, dizziness, chills/cold. Patient states history of anemia. There's no availability for provider until Jan 12, 2024, offered appointment with another provider and tried to triage patient with nurse because of symptoms. Patient was upset and said he's going to change doctor, contact her onocologist because this is serious and hung the call.

## 2023-12-29 ENCOUNTER — Other Ambulatory Visit: Payer: Self-pay

## 2023-12-29 ENCOUNTER — Ambulatory Visit (INDEPENDENT_AMBULATORY_CARE_PROVIDER_SITE_OTHER): Payer: Medicare HMO | Admitting: Family Medicine

## 2023-12-29 VITALS — BP 122/60 | HR 49 | Temp 98.1°F | Ht 68.0 in | Wt 141.8 lb

## 2023-12-29 DIAGNOSIS — M545 Low back pain, unspecified: Secondary | ICD-10-CM

## 2023-12-29 DIAGNOSIS — G8929 Other chronic pain: Secondary | ICD-10-CM | POA: Diagnosis not present

## 2023-12-29 DIAGNOSIS — R42 Dizziness and giddiness: Secondary | ICD-10-CM | POA: Diagnosis not present

## 2023-12-29 LAB — CBC
HCT: 37.1 % — ABNORMAL LOW (ref 39.0–52.0)
Hemoglobin: 11.8 g/dL — ABNORMAL LOW (ref 13.0–17.0)
MCHC: 31.8 g/dL (ref 30.0–36.0)
MCV: 79.6 fL (ref 78.0–100.0)
Platelets: 157 10*3/uL (ref 150.0–400.0)
RBC: 4.67 Mil/uL (ref 4.22–5.81)
RDW: 17.1 % — ABNORMAL HIGH (ref 11.5–15.5)
WBC: 9.9 10*3/uL (ref 4.0–10.5)

## 2023-12-29 LAB — BASIC METABOLIC PANEL
BUN: 13 mg/dL (ref 6–23)
CO2: 27 meq/L (ref 19–32)
Calcium: 8.9 mg/dL (ref 8.4–10.5)
Chloride: 108 meq/L (ref 96–112)
Creatinine, Ser: 1.16 mg/dL (ref 0.40–1.50)
GFR: 63.84 mL/min (ref >60.00)
Glucose, Bld: 83 mg/dL (ref 70–99)
Potassium: 4.1 meq/L (ref 3.5–5.1)
Sodium: 140 meq/L (ref 135–145)

## 2023-12-29 NOTE — Patient Instructions (Signed)
 Back Exercises The following exercises strengthen the muscles that help to support the trunk (torso) and back. They also help to keep the lower back flexible. Doing these exercises can help to prevent or lessen existing low back pain. If you have back pain or discomfort, try doing these exercises 2-3 times each day or as told by your health care provider. As your pain improves, do them once each day, but increase the number of times that you repeat the steps for each exercise (do more repetitions). To prevent the recurrence of back pain, continue to do these exercises once each day or as told by your health care provider. Do exercises exactly as told by your health care provider and adjust them as directed. It is normal to feel mild stretching, pulling, tightness, or discomfort as you do these exercises, but you should stop right away if you feel sudden pain or your pain gets worse. Exercises Single knee to chest Repeat these steps 3-5 times for each leg: Lie on your back on a firm bed or the floor with your legs extended. Bring one knee to your chest. Your other leg should stay extended and in contact with the floor. Hold your knee in place by grabbing your knee or thigh with both hands and hold. Pull on your knee until you feel a gentle stretch in your lower back or buttocks. Hold the stretch for 10-30 seconds. Slowly release and straighten your leg.  Pelvic tilt Repeat these steps 5-10 times: Lie on your back on a firm bed or the floor with your legs extended. Bend your knees so they are pointing toward the ceiling and your feet are flat on the floor. Tighten your lower abdominal muscles to press your lower back against the floor. This motion will tilt your pelvis so your tailbone points up toward the ceiling instead of pointing to your feet or the floor. With gentle tension and even breathing, hold this position for 5-10 seconds.  Cat-cow Repeat these steps until your lower back becomes  more flexible: Get into a hands-and-knees position on a firm bed or the floor. Keep your hands under your shoulders, and keep your knees under your hips. You may place padding under your knees for comfort. Let your head hang down toward your chest. Contract your abdominal muscles and point your tailbone toward the floor so your lower back becomes rounded like the back of a cat. Hold this position for 5 seconds. Slowly lift your head, let your abdominal muscles relax, and point your tailbone up toward the ceiling so your back forms a sagging arch like the back of a cow. Hold this position for 5 seconds.  Press-ups Repeat these steps 5-10 times: Lie on your abdomen (face-down) on a firm bed or the floor. Place your palms near your head, about shoulder-width apart. Keeping your back as relaxed as possible and keeping your hips on the floor, slowly straighten your arms to raise the top half of your body and lift your shoulders. Do not use your back muscles to raise your upper torso. You may adjust the placement of your hands to make yourself more comfortable. Hold this position for 5 seconds while you keep your back relaxed. Slowly return to lying flat on the floor.  Bridges Repeat these steps 10 times: Lie on your back on a firm bed or the floor. Bend your knees so they are pointing toward the ceiling and your feet are flat on the floor. Your arms should be flat  at your sides, next to your body. Tighten your buttocks muscles and lift your buttocks off the floor until your waist is at almost the same height as your knees. You should feel the muscles working in your buttocks and the back of your thighs. If you do not feel these muscles, slide your feet 1-2 inches (2.5-5 cm) farther away from your buttocks. Hold this position for 3-5 seconds. Slowly lower your hips to the starting position, and allow your buttocks muscles to relax completely. If this exercise is too easy, try doing it with your arms  crossed over your chest. Back lifts Repeat these steps 5-10 times: Lie on your abdomen (face-down) with your arms at your sides, and rest your forehead on the floor. Tighten the muscles in your legs and your buttocks. Slowly lift your chest off the floor while you keep your hips pressed to the floor. Keep the back of your head in line with the curve in your back. Your eyes should be looking at the floor. Hold this position for 3-5 seconds. Slowly return to your starting position.  Contact a health care provider if: Your back pain or discomfort gets much worse when you do an exercise. Your worsening back pain or discomfort does not lessen within 2 hours after you exercise. If you have any of these problems, stop doing these exercises right away. Do not do them again unless your health care provider says that you can. Get help right away if: You develop sudden, severe back pain. If this happens, stop doing the exercises right away. Do not do them again unless your health care provider says that you can. This information is not intended to replace advice given to you by your health care provider. Make sure you discuss any questions you have with your health care provider. Document Revised: 01/10/2023 Document Reviewed: 02/19/2021 Elsevier Patient Education  2024 [employer].

## 2023-12-29 NOTE — Assessment & Plan Note (Signed)
 Based on patient's description I suspect he got orthostatic.  I encouraged increased water intake.  We will check labs.  Patient is bradycardic today though this appears to be chronic and has been worked up by cardiology previously.  Advised if he has recurrent lightheadedness he needs to let us  know.  Discussed if he has a syncopal episode he needs to go to the emergency department.

## 2023-12-29 NOTE — Telephone Encounter (Signed)
 Seen in the office today.

## 2023-12-29 NOTE — Assessment & Plan Note (Signed)
 Appears to be a chronic issue.  Looks like he has discussed this previously with his PCP.  Most recent CT abdomen and pelvis revealed some degenerative disc disease at the lumbosacral junction.  Discussed doing some exercises and these were provided to the patient.  If not improving he could do PT and consider focused imaging of his lumbar spine.

## 2023-12-29 NOTE — Progress Notes (Signed)
 Specialty Pharmacy Refill Coordination Note  [name] is a 73 y.o. male contacted today regarding refills of specialty medication(s) Zanubrutinib  (Brukinsa )   Patient requested Delivery   Delivery date: 01/03/24   Verified address: [address]   [facility] [place] [place] [address]   Medication will be filled on 12/31/23.

## 2023-12-29 NOTE — Progress Notes (Signed)
 Joseph Her, MD Phone: [phone]  [name] is a 73 y.o. male who presents today for same-day visit.  Lightheadedness: Patient notes yesterday he got up from eating lunch and walked short ways and started to feel lightheaded and weak overall.  He sat back down and progressively improved.  He noted no chest pain or palpitations with this.  He drank a bottle of water.  Since then he has felt fine.  He notes no syncope.  Has a history of syncope in the past.  Patient notes he does not drink enough water.  Low back pain: This has been going on for 6+ months.  Only bothers him if he sneezes or coughs in the morning.  No numbness or weakness that is any different than his typical.  No radiation of the pain.  No bowel incontinence or change to urinary urge incontinence.  No known injury.  Does some exercises with some benefit.  Social History   Tobacco Use  Smoking Status Every Day   Types: Cigars  Smokeless Tobacco Never  Tobacco Comments   .5 ppd last year, total of 48.5 pack year   Smokes 1 cigar (black & milds) per day    Current Outpatient Medications on File Prior to Visit  Medication Sig Dispense Refill   aspirin  EC 81 MG tablet Take 81 mg by mouth daily. Swallow whole. 90 tablet 3   guaiFENesin  (MUCINEX ) 600 MG 12 hr tablet Take 2 tablets (1,200 mg total) by mouth 2 (two) times daily. 28 tablet 3   pantoprazole  (PROTONIX ) 40 MG tablet Take 1 tablet (40 mg total) by mouth daily. Prior to breakfast, 30 min to 1 hr 90 tablet 3   rosuvastatin  (CRESTOR ) 20 MG tablet Take 1 tablet (20 mg total) by mouth daily. (Patient not taking: Reported on 08/05/2023) 90 tablet 3   tamsulosin  (FLOMAX ) 0.4 MG CAPS capsule Take 2 capsules (0.8 mg total) by mouth daily. 180 capsule 3   traZODone  (DESYREL ) 50 MG tablet Take 0.5-1 tablets (25-50 mg total) by mouth at bedtime. (Patient not taking: Reported on 10/11/2023) 90 tablet 3   umeclidinium-vilanterol (ANORO ELLIPTA ) 62.5-25 MCG/ACT AEPB Inhale  1 puff into the lungs daily. 30 each 11   zanubrutinib  (BRUKINSA ) 80 MG capsule Take 2 capsules (160 mg total) by mouth 2 (two) times daily. 120 capsule 3   No current facility-administered medications on file prior to visit.     ROS see history of present illness  Objective  Physical Exam Vitals:   12/29/23 0911  BP: 122/60  Pulse: (!) 49  Temp: 98.1 F (36.7 C)  SpO2: 98%    BP Readings from Last 3 Encounters:  12/29/23 122/60  10/11/23 128/76  09/08/23 128/76   Wt Readings from Last 3 Encounters:  12/29/23 141 lb 12.8 oz (64.3 kg)  10/11/23 138 lb 12.8 oz (63 kg)  09/08/23 133 lb (60.3 kg)    Physical Exam Constitutional:      General: He is not in acute distress.    Appearance: He is not diaphoretic.  Cardiovascular:     Rate and Rhythm: Regular rhythm. Bradycardia present.     Heart sounds: Normal heart sounds.  Pulmonary:     Effort: Pulmonary effort is normal.     Breath sounds: Normal breath sounds.  Musculoskeletal:     Comments: No midline spine tenderness, no midline spine step-off, no muscular back tenderness  Skin:    General: Skin is warm and dry.  Neurological:  Mental Status: He is alert.     Comments: 5/5 strength in bilateral biceps, triceps, grip, quads, hamstrings, plantar and dorsiflexion, sensation to light touch intact in bilateral UE and LE, normal gait      Assessment/Plan: Please see individual problem list.  Lightheadedness Assessment & Plan: Based on patient's description I suspect he got orthostatic.  I encouraged increased water intake.  We will check labs.  Patient is bradycardic today though this appears to be chronic and has been worked up by cardiology previously.  Advised if he has recurrent lightheadedness he needs to let us  know.  Discussed if he has a syncopal episode he needs to go to the emergency department.  Orders: -     CBC -     Basic metabolic panel  Chronic low back pain, unspecified back pain laterality,  unspecified whether sciatica present Assessment & Plan: Appears to be a chronic issue.  Looks like he has discussed this previously with his PCP.  Most recent CT abdomen and pelvis revealed some degenerative disc disease at the lumbosacral junction.  Discussed doing some exercises and these were provided to the patient.  If not improving he could do PT and consider focused imaging of his lumbar spine.    Return if symptoms worsen or fail to improve.   Joseph Her, MD [facility]

## 2023-12-30 ENCOUNTER — Other Ambulatory Visit: Payer: Self-pay

## 2023-12-30 ENCOUNTER — Ambulatory Visit: Payer: Medicare HMO | Admitting: Nurse Practitioner

## 2023-12-30 NOTE — Progress Notes (Signed)
 Patient was contacted today regarding specialty medication, due to upcoming weather conditions medications will be filled 12/30/23 and will be delivered 12/31/23

## 2024-01-11 ENCOUNTER — Inpatient Hospital Stay: Payer: Medicare HMO

## 2024-01-11 ENCOUNTER — Inpatient Hospital Stay: Payer: Medicare HMO | Admitting: Oncology

## 2024-01-25 ENCOUNTER — Other Ambulatory Visit (HOSPITAL_COMMUNITY): Payer: Self-pay

## 2024-01-26 ENCOUNTER — Inpatient Hospital Stay: Payer: Medicare HMO | Admitting: Oncology

## 2024-01-26 ENCOUNTER — Other Ambulatory Visit: Payer: Self-pay

## 2024-01-26 ENCOUNTER — Inpatient Hospital Stay: Payer: Medicare HMO | Attending: Oncology

## 2024-01-26 ENCOUNTER — Encounter: Payer: Self-pay | Admitting: Oncology

## 2024-01-26 VITALS — BP 126/85 | HR 59 | Temp 97.6°F | Resp 18 | Wt 136.8 lb

## 2024-01-26 DIAGNOSIS — C9110 Chronic lymphocytic leukemia of B-cell type not having achieved remission: Secondary | ICD-10-CM | POA: Diagnosis present

## 2024-01-26 DIAGNOSIS — Z5111 Encounter for antineoplastic chemotherapy: Secondary | ICD-10-CM | POA: Diagnosis not present

## 2024-01-26 DIAGNOSIS — D649 Anemia, unspecified: Secondary | ICD-10-CM

## 2024-01-26 DIAGNOSIS — Z9221 Personal history of antineoplastic chemotherapy: Secondary | ICD-10-CM | POA: Diagnosis not present

## 2024-01-26 DIAGNOSIS — R7989 Other specified abnormal findings of blood chemistry: Secondary | ICD-10-CM

## 2024-01-26 DIAGNOSIS — Z720 Tobacco use: Secondary | ICD-10-CM

## 2024-01-26 LAB — CBC WITH DIFFERENTIAL (CANCER CENTER ONLY)
Abs Immature Granulocytes: 0.49 10*3/uL — ABNORMAL HIGH (ref 0.00–0.07)
Basophils Absolute: 0.1 10*3/uL (ref 0.0–0.1)
Basophils Relative: 1 %
Eosinophils Absolute: 0.2 10*3/uL (ref 0.0–0.5)
Eosinophils Relative: 2 %
HCT: 40.6 % (ref 39.0–52.0)
Hemoglobin: 12.7 g/dL — ABNORMAL LOW (ref 13.0–17.0)
Immature Granulocytes: 6 %
Lymphocytes Relative: 34 %
Lymphs Abs: 3 10*3/uL (ref 0.7–4.0)
MCH: 24.5 pg — ABNORMAL LOW (ref 26.0–34.0)
MCHC: 31.3 g/dL (ref 30.0–36.0)
MCV: 78.2 fL — ABNORMAL LOW (ref 80.0–100.0)
Monocytes Absolute: 1.1 10*3/uL — ABNORMAL HIGH (ref 0.1–1.0)
Monocytes Relative: 12 %
Neutro Abs: 4.1 10*3/uL (ref 1.7–7.7)
Neutrophils Relative %: 45 %
Platelet Count: 178 10*3/uL (ref 150–400)
RBC: 5.19 MIL/uL (ref 4.22–5.81)
RDW: 18.6 % — ABNORMAL HIGH (ref 11.5–15.5)
WBC Count: 9 10*3/uL (ref 4.0–10.5)
nRBC: 0 % (ref 0.0–0.2)

## 2024-01-26 LAB — IRON AND TIBC
Iron: 37 ug/dL — ABNORMAL LOW (ref 45–182)
Saturation Ratios: 16 % — ABNORMAL LOW (ref 17.9–39.5)
TIBC: 231 ug/dL — ABNORMAL LOW (ref 250–450)
UIBC: 194 ug/dL

## 2024-01-26 LAB — RETIC PANEL
Immature Retic Fract: 15.2 % (ref 2.3–15.9)
RBC.: 5.15 MIL/uL (ref 4.22–5.81)
Retic Count, Absolute: 98.9 10*3/uL (ref 19.0–186.0)
Retic Ct Pct: 1.9 % (ref 0.4–3.1)
Reticulocyte Hemoglobin: 26.8 pg — ABNORMAL LOW (ref >27.9)

## 2024-01-26 LAB — CMP (CANCER CENTER ONLY)
ALT: 15 U/L (ref 0–44)
AST: 18 U/L (ref 15–41)
Albumin: 4.1 g/dL (ref 3.5–5.0)
Alkaline Phosphatase: 38 U/L (ref 38–126)
Anion gap: 9 (ref 5–15)
BUN: 10 mg/dL (ref 8–23)
CO2: 24 mmol/L (ref 22–32)
Calcium: 8.8 mg/dL — ABNORMAL LOW (ref 8.9–10.3)
Chloride: 103 mmol/L (ref 98–111)
Creatinine: 1.35 mg/dL — ABNORMAL HIGH (ref 0.61–1.24)
GFR, Estimated: 56 mL/min — ABNORMAL LOW (ref >60)
Glucose, Bld: 101 mg/dL — ABNORMAL HIGH (ref 70–99)
Potassium: 4.2 mmol/L (ref 3.5–5.1)
Sodium: 136 mmol/L (ref 135–145)
Total Bilirubin: 1.1 mg/dL (ref 0.0–1.2)
Total Protein: 6.4 g/dL — ABNORMAL LOW (ref 6.5–8.1)

## 2024-01-26 LAB — FERRITIN: Ferritin: 295 ng/mL (ref 24–336)

## 2024-01-26 MED ORDER — BRUKINSA 80 MG PO CAPS
160.0000 mg | ORAL_CAPSULE | Freq: Two times a day (BID) | ORAL | 3 refills | Status: DC
  Filled 2024-01-26 – 2024-02-23 (×2): qty 120, 30d supply, fill #0
  Filled 2024-03-31: qty 120, 30d supply, fill #1

## 2024-01-26 NOTE — Assessment & Plan Note (Addendum)
 Recurrent CLL, trisomy 12 and TP53 mutated, IgVH Somatic Hypermutation unmutated He did not Acalabrutinib  due to diarrhea. He prefers not to try reduced dose, S/p Rituximab  weekly x 4, CT showed progression--> Abdominal mass biopsy confirmed CLL, no evidence of transformation Labs reviewed and discussed with patient. Wbc has normalized Overall he tolerates Zanubrutinib  160 mg twice daily.  Previous CT showed excellent treatment response.  Recommend patient to continue current regimen.

## 2024-01-26 NOTE — Assessment & Plan Note (Signed)
CLL treatment as listed above 

## 2024-01-26 NOTE — Assessment & Plan Note (Signed)
 Recommend smoke cessation.

## 2024-01-26 NOTE — Progress Notes (Signed)
 Pt here for follow up. Pt states he stopped taking Brukinsa  about a week ago

## 2024-01-26 NOTE — Assessment & Plan Note (Signed)
Encourage oral hydration.  Repeat BMP in 1 week

## 2024-01-26 NOTE — Progress Notes (Signed)
 Hematology/Oncology Progress note [phone] [phone]   Chief Complaint: [name] is a 73 y.o. male presents to follow up for chronic lymphocytic lymphoma.   ASSESSMENT & PLAN:   Cancer Staging  Chronic lymphocytic leukemia (CLL), B-cell (HCC) Staging form: Chronic Lymphocytic Leukemia / Small Lymphocytic Lymphoma, AJCC 8th Edition - Clinical stage from 07/23/2017: Modified Rai Stage III (Modified Rai risk: High, Lymphocytosis: Present, Adenopathy: Present, Organomegaly: Absent, Anemia: Present, Thrombocytopenia: Absent) - Signed by [name], MD on 02/19/2022   Chronic lymphocytic leukemia (CLL), B-cell (HCC) Recurrent CLL, trisomy 12 and TP53 mutated, IgVH Somatic Hypermutation unmutated He did not Acalabrutinib  due to diarrhea. He prefers not to try reduced dose, S/p Rituximab  weekly x 4, CT showed progression--> Abdominal mass biopsy confirmed CLL, no evidence of transformation Labs reviewed and discussed with patient. Wbc has normalized Overall he tolerates Zanubrutinib  160 mg twice daily.  Previous CT showed excellent treatment response.  Recommend patient to continue current regimen.   Encounter for antineoplastic chemotherapy CLL treatment as listed above  Normocytic anemia Secondary to CLL with bone marrow involvement Hb has improved  Tobacco abuse Recommend smoke cessation.   Follow-up  3 months lab MD    All questions were answered. The patient knows to call the clinic with any problems, questions or concerns.  Call Babara, MD, PhD [facility] Hematology Oncology 01/26/2024     HPI:   Oncology History  Chronic lymphocytic leukemia (CLL), B-cell (HCC)  09/13/2014 Initial Diagnosis   CLL - hemolytic anemia in 01/2014.  He was treated with prednisone . 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  -Oct 2020 second opinion at [facility] and was evaluated by heme-onc Dr.Ellis.  Patient was recommended for watchful waiting   10/02/2014 - 10/23/2014 Chemotherapy   -10/02/2014- 10/23/2014  4 weekly cycles of Rituxan   -07/23/2017 Decision was made to start Ibrutinib  as patient is very symptomatic (fatigue and weight loss, lack of appetite). Patient did not show up at the chemo education class   05/08/2022 Imaging   CT CHEST, ABDOMEN, AND PELVIS WITH CONTRAST 1 unchanged enlarged bilateral axillary lymph nodes and prominent pretracheal mediastinal lymph nodes. 2. However, when compared to most recent prior CT examination of the abdomen and pelvis dated 06/24/2016, there are numerous, extremely bulky lymph nodes throughout the abdomen and pelvis, which are greatly increased in size. Newly enlarged left inguinal lymph nodes, in keeping with reported palpable lymphadenopathy. 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 3. Mild right hydronephrosis; the proximal ureter appears compressed or obstructed by overlying lymphadenopathy. 4. Emphysema.5. Coronary artery disease.   05/30/2022 Imaging   PET scan showed  1. Again seen is massive nodal conglomeration within the abdomen with mildly diffuse increased tracer uptake with SUV max of 3.15.There also enlarged bilateral pelvic and left inguinal lymph nodes which exhibit mild increased tracer uptake compatible with residual/recurrent lymphoma. Imaging findings are compatible with residual/recurrent metabolically active low-grade lymphoma. 2. No significant tracer uptake associated with the borderline enlarged bilateral axillary lymph nodes.3. Unchanged appearance of small nonspecific subpleural nodules within the periphery of the upper lobes measuring up to 4 mm. These are too small to characterize by PET-CT.4. Aortic Atherosclerosis (ICD10-I70.0) and Emphysema (ICD10-J43.9). 5. Coronary artery calcifications. 6. Normal size  spleen with background activity similar to the liver   06/22/2022 -  Chemotherapy   acalabrutinib  100 mg  twice daily.   07/27/2022 - 08/20/2022 Chemotherapy   Patient is on Treatment Plan : ITP Rituximab  q7d x 4 cycles     11/09/2022 Imaging   CT abdomen pelvis w contrast  1. Progressive bulky retroperitoneal and mesenteric lymphadenopathy consistent with progressive lymphoma. 2. No evidence of bowel obstruction, perforation or abscess. 3. Stable small amount of free pelvic fluid. 4. Aortic Atherosclerosis (ICD10-I70.0). Chronically occluded right femoral arterial stent    11/09/2022 [facility] Admission   Patient presented to emergency room for evaluation of shortness of breath.  He has noticed progressive worsening of abdominal swelling and distention.  Associated with shortness of breath.  Patient was started on diuretics.     11/24/2022 Procedure   CT guided biopsy of abdomen mass  Pathology showed CLL/SLL Core biopsy sections demonstrate lymphoid tissue with partially effaced follicular architecture.  The bulk of the tissue consists of sheets of small lymphocytes with dense chromatin and scant cytoplasm.  There is no significant increase in larger prolymphocytic type cells.  Relatively few small germinal centers are appreciated.  Concurrent flow cytometric studies demonstrate a CD5+, CD23 + monoclonal B-cell population comprising 87% of total lymphoid cells and 100% of B cells.  The phenotype is compatible with chronic lymphocytic leukemia/small lymphocytic lymphoma.  There are no features in the current sample to suggest progression to higher grade process. Clinical and radiographic correlation is recommended.    12/09/2022 -  Chemotherapy   Zanubrutinib  160 mg twice daily     INTERVAL HISTORY 73 y.o. male with history of CLL presents for follow-up. Patient has been on Zanubrutinib  160 mg twice daily, recently due to his brother's illness, he has skipped medication for about 1  week.  Denies any nausea vomiting diarrhea, denies abdominal pain.    Past Medical History:  Diagnosis Date   Anemia 2008   Arthritis    Cervicalgia    Chronic lymphocytic leukemia (CLL), B-cell (HCC) 06/15/2015   Dr. Babara   Hyperlipidemia    Low back pain    Peripheral vascular disease [facility])     Past Surgical History:  Procedure Laterality Date   COLONOSCOPY     COLONOSCOPY WITH PROPOFOL  N/A 07/23/2015   Procedure: COLONOSCOPY WITH PROPOFOL ;  Surgeon: Reyes LELON Cota, MD;  Location: [facility] ENDOSCOPY;  Service: Endoscopy;  Laterality: N/A;   COLONOSCOPY WITH PROPOFOL  N/A 04/20/2022   Procedure: COLONOSCOPY WITH PROPOFOL ;  Surgeon: Therisa Bi, MD;  Location: [facility] ENDOSCOPY;  Service: Gastroenterology;  Laterality: N/A;   ESOPHAGOGASTRODUODENOSCOPY (EGD) WITH PROPOFOL  N/A 08/20/2015   Procedure: ESOPHAGOGASTRODUODENOSCOPY (EGD) WITH PROPOFOL ;  Surgeon: Reyes LELON Cota, MD;  Location: [facility] ENDOSCOPY;  Service: Endoscopy;  Laterality: N/A;   LOWER EXTREMITY ANGIOGRAPHY Left 05/19/2018   Procedure: LOWER EXTREMITY ANGIOGRAPHY;  Surgeon: Marea Selinda RAMAN, MD;  Location: [facility] INVASIVE CV LAB;  Service: Cardiovascular;  Laterality: Left;   LOWER EXTREMITY ANGIOGRAPHY Right 06/15/2018   Procedure: LOWER EXTREMITY ANGIOGRAPHY;  Surgeon: Marea Selinda RAMAN, MD;  Location: [facility] INVASIVE CV LAB;  Service: Cardiovascular;  Laterality: Right;   lymp node removal Right 2011   arm   left arm 2015   LYMPH NODE BIOPSY  08/2014    Family History  Problem Relation Age of Onset   Alcohol abuse Mother    Cirrhosis Mother    Heart attack Father    Kidney cancer Neg Hx        lung cancer   Bladder Cancer Neg Hx    Prostate cancer Neg Hx  Social History:  reports that he has been smoking cigars. He has never used smokeless tobacco. He reports that he does not drink alcohol and does not use drugs.  Allergies:  Allergies  Allergen Reactions   Penicillins     Other reaction(s): Rash, Hives ()   Latex Rash    Review of Systems:  Review of Systems  Constitutional:  Negative for chills, fever, malaise/fatigue and weight loss.  HENT:  Negative for ear discharge, ear pain, nosebleeds and sore throat.   Eyes:  Negative for photophobia, pain and redness.  Respiratory:  Negative for cough, hemoptysis, sputum production, shortness of breath and wheezing.   Cardiovascular:  Negative for chest pain, palpitations and leg swelling.  Gastrointestinal:  Negative for abdominal pain, blood in stool, constipation, diarrhea, heartburn, nausea and vomiting.  Genitourinary:  Negative for dysuria and frequency.  Musculoskeletal:  Negative for myalgias and neck pain.  Skin:  Negative for rash.  Neurological:  Negative for dizziness, tingling, tremors, weakness and headaches.  Endo/Heme/Allergies:  Does not bruise/bleed easily.  Psychiatric/Behavioral:  Negative for depression, hallucinations and suicidal ideas.      Current Outpatient Medications on File Prior to Visit  Medication Sig Dispense Refill   aspirin  EC 81 MG tablet Take 81 mg by mouth daily. Swallow whole. 90 tablet 3   guaiFENesin  (MUCINEX ) 600 MG 12 hr tablet Take 2 tablets (1,200 mg total) by mouth 2 (two) times daily. 28 tablet 3   pantoprazole  (PROTONIX ) 40 MG tablet Take 1 tablet (40 mg total) by mouth daily. Prior to breakfast, 30 min to 1 hr 90 tablet 3   rosuvastatin  (CRESTOR ) 20 MG tablet Take 1 tablet (20 mg total) by mouth daily. 90 tablet 3   tamsulosin  (FLOMAX ) 0.4 MG CAPS capsule Take 2 capsules (0.8 mg total) by mouth daily. 180 capsule 3   traZODone  (DESYREL ) 50 MG tablet Take 0.5-1 tablets (25-50 mg total) by mouth at bedtime. 90 tablet 3   umeclidinium-vilanterol (ANORO ELLIPTA ) 62.5-25 MCG/ACT AEPB Inhale 1 puff into the lungs daily. (Patient not taking: Reported on 01/26/2024) 30 each 11   No current facility-administered medications on file prior to visit.    Physical Exam: Blood pressure 126/85, pulse (!) 59, temperature 97.6  F (36.4 C), resp. rate 18, weight 136 lb 12.8 oz (62.1 kg), SpO2 100%. Physical Exam Constitutional:      General: He is not in acute distress.    Appearance: He is not diaphoretic.  HENT:     Head: Normocephalic.  Eyes:     General: No scleral icterus. Cardiovascular:     Rate and Rhythm: Normal rate and regular rhythm.  Pulmonary:     Effort: Pulmonary effort is normal. No respiratory distress.     Breath sounds: No wheezing.  Abdominal:     General: Bowel sounds are normal. There is no distension.     Palpations: Abdomen is soft. There is no mass.  Genitourinary:    Comments:   Musculoskeletal:        General: Normal range of motion.     Cervical back: Normal range of motion.  Skin:    General: Skin is warm and dry.     Findings: No erythema.  Neurological:     Mental Status: He is alert and oriented to person, place, and time. Mental status is at baseline.     Cranial Nerves: No cranial nerve deficit.     Motor: No abnormal muscle tone.  Psychiatric:  Mood and Affect: Mood and affect normal.     LABORATORY RESULTS.    Latest Ref Rng & Units 01/26/2024   10:28 AM 12/29/2023    9:31 AM 10/11/2023   10:43 AM  CBC  WBC 4.0 - 10.5 K/uL 9.0  9.9  9.1   Hemoglobin 13.0 - 17.0 g/dL 87.2  88.1  87.9   Hematocrit 39.0 - 52.0 % 40.6  37.1  38.2   Platelets 150 - 400 K/uL 178  157.0  160       Latest Ref Rng & Units 01/26/2024   10:29 AM 12/29/2023    9:31 AM 10/11/2023   10:44 AM  CMP  Glucose 70 - 99 mg/dL 898  83  891   BUN 8 - 23 mg/dL 10  13  14    Creatinine 0.61 - 1.24 mg/dL 8.64  8.83  9.01   Sodium 135 - 145 mmol/L 136  140  137   Potassium 3.5 - 5.1 mmol/L 4.2  4.1  4.5   Chloride 98 - 111 mmol/L 103  108  105   CO2 22 - 32 mmol/L 24  27  25    Calcium  8.9 - 10.3 mg/dL 8.8  8.9  8.6   Total Protein 6.5 - 8.1 g/dL 6.4   6.2   Total Bilirubin 0.0 - 1.2 mg/dL 1.1   0.6   Alkaline Phos 38 - 126 U/L 38   40   AST 15 - 41 U/L 18   17   ALT 0 - 44 U/L 15   15       PATHOLOGY:  11/02/2016 Peripheral blood FISH studies revealed 20% of nuclei positive for loss of 1 ATM signal, 63% of nuclei positive for trisomy 12, and 32% of nuclei positive for loss of 1 TP53 signal. Results for CCND1/IGH and 13q were normal.  09/13/2014 Left axillary node biopsy on  confirmed B-cell small lymphocytic lymphoma (B-CLL/SLL)   IMAGE STUDIES No results found.

## 2024-01-26 NOTE — Assessment & Plan Note (Signed)
Secondary to CLL with bone marrow involvement Hb has improved

## 2024-01-28 ENCOUNTER — Encounter (HOSPITAL_COMMUNITY): Payer: Self-pay

## 2024-01-28 ENCOUNTER — Other Ambulatory Visit (HOSPITAL_COMMUNITY): Payer: Self-pay

## 2024-01-31 ENCOUNTER — Other Ambulatory Visit (HOSPITAL_COMMUNITY): Payer: Self-pay

## 2024-02-02 ENCOUNTER — Inpatient Hospital Stay: Payer: Medicare HMO

## 2024-02-02 DIAGNOSIS — D649 Anemia, unspecified: Secondary | ICD-10-CM | POA: Diagnosis not present

## 2024-02-02 DIAGNOSIS — C9110 Chronic lymphocytic leukemia of B-cell type not having achieved remission: Secondary | ICD-10-CM | POA: Diagnosis not present

## 2024-02-02 DIAGNOSIS — Z9221 Personal history of antineoplastic chemotherapy: Secondary | ICD-10-CM | POA: Diagnosis not present

## 2024-02-02 LAB — BASIC METABOLIC PANEL - CANCER CENTER ONLY
Anion gap: 7 (ref 5–15)
BUN: 11 mg/dL (ref 8–23)
CO2: 25 mmol/L (ref 22–32)
Calcium: 8.9 mg/dL (ref 8.9–10.3)
Chloride: 106 mmol/L (ref 98–111)
Creatinine: 0.99 mg/dL (ref 0.61–1.24)
GFR, Estimated: >60 mL/min (ref >60)
Glucose, Bld: 122 mg/dL — ABNORMAL HIGH (ref 70–99)
Potassium: 4.8 mmol/L (ref 3.5–5.1)
Sodium: 138 mmol/L (ref 135–145)

## 2024-02-03 ENCOUNTER — Encounter: Payer: Self-pay | Admitting: Oncology

## 2024-02-16 DIAGNOSIS — J019 Acute sinusitis, unspecified: Secondary | ICD-10-CM | POA: Diagnosis not present

## 2024-02-16 DIAGNOSIS — J439 Emphysema, unspecified: Secondary | ICD-10-CM | POA: Diagnosis not present

## 2024-02-16 DIAGNOSIS — C8334 Diffuse large B-cell lymphoma, lymph nodes of axilla and upper limb: Secondary | ICD-10-CM | POA: Diagnosis not present

## 2024-02-16 DIAGNOSIS — Z03818 Encounter for observation for suspected exposure to other biological agents ruled out: Secondary | ICD-10-CM | POA: Diagnosis not present

## 2024-02-16 DIAGNOSIS — B9689 Other specified bacterial agents as the cause of diseases classified elsewhere: Secondary | ICD-10-CM | POA: Diagnosis not present

## 2024-02-16 DIAGNOSIS — R051 Acute cough: Secondary | ICD-10-CM | POA: Diagnosis not present

## 2024-02-23 ENCOUNTER — Other Ambulatory Visit: Payer: Self-pay

## 2024-02-23 ENCOUNTER — Other Ambulatory Visit: Payer: Self-pay | Admitting: Pharmacy Technician

## 2024-02-23 NOTE — Progress Notes (Signed)
 Specialty Pharmacy Refill Coordination Note  [name] is a 73 y.o. male contacted today regarding refills of specialty medication(s) Zanubrutinib (Brukinsa)   Patient requested Delivery   Delivery date: 02/25/24   Verified address: [address] [place]   Medication will be filled on 02/24/24.

## 2024-02-24 ENCOUNTER — Other Ambulatory Visit: Payer: Self-pay

## 2024-03-21 ENCOUNTER — Other Ambulatory Visit: Payer: Self-pay

## 2024-03-23 ENCOUNTER — Other Ambulatory Visit: Payer: Self-pay

## 2024-03-27 ENCOUNTER — Other Ambulatory Visit: Payer: Self-pay

## 2024-03-31 ENCOUNTER — Other Ambulatory Visit (HOSPITAL_COMMUNITY): Payer: Self-pay

## 2024-03-31 ENCOUNTER — Other Ambulatory Visit: Payer: Self-pay

## 2024-03-31 NOTE — Progress Notes (Signed)
 Specialty Pharmacy Refill Coordination Note  [name] is a 73 y.o. male contacted today regarding refills of specialty medication(s) Zanubrutinib (Brukinsa)   Patient requested Delivery   Delivery date: 04/04/24   Verified address: [address]  [facility] [place] [address]   Medication will be filled on 04/03/24.

## 2024-04-19 ENCOUNTER — Encounter (HOSPITAL_COMMUNITY): Payer: Self-pay

## 2024-04-19 ENCOUNTER — Other Ambulatory Visit (HOSPITAL_COMMUNITY): Payer: Self-pay

## 2024-04-20 ENCOUNTER — Encounter: Payer: Self-pay | Admitting: Nurse Practitioner

## 2024-04-20 ENCOUNTER — Ambulatory Visit: Attending: Nurse Practitioner | Admitting: Nurse Practitioner

## 2024-04-20 VITALS — BP 110/60 | HR 49 | Ht 68.0 in | Wt 142.2 lb

## 2024-04-20 DIAGNOSIS — E785 Hyperlipidemia, unspecified: Secondary | ICD-10-CM

## 2024-04-20 DIAGNOSIS — I739 Peripheral vascular disease, unspecified: Secondary | ICD-10-CM

## 2024-04-20 DIAGNOSIS — I2510 Atherosclerotic heart disease of native coronary artery without angina pectoris: Secondary | ICD-10-CM | POA: Diagnosis not present

## 2024-04-20 DIAGNOSIS — Z720 Tobacco use: Secondary | ICD-10-CM

## 2024-04-20 NOTE — Progress Notes (Signed)
 Office Visit    Patient Name: [name] Date of Encounter: 04/20/2024  Primary Care Provider:  Calista Catching, FNP Primary Cardiologist:  Belva Boyden, MD  Chief Complaint    73 y.o. male with a history of coronary artery disease, hyperlipidemia, peripheral arterial disease status post lower extremity interventions, CLL, anemia, and tobacco abuse, who presents for follow-up related to CAD and PAD.  Past Medical History  Subjective   Past Medical History:  Diagnosis Date   Anemia 2008   Arthritis    CAD (coronary artery disease)    a. 05/2021 Cor CTA: LM nl, LAD 25-49%, D1 >70 (FFRct 0.73), LCXnl, RCA <25% throughout-->Med rx.   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 Wilhelmenia Harada   History of echocardiogram    a. 05/2022 Echo: EF 55-60%, no rwma, nl RV fxn, mildly dil RA, mild MR, mild AI. Asc Ao 36mm.   Hyperlipidemia    Low back pain    Peripheral vascular disease (HCC)    a. 04/2018 R SFA/popliteal stenting; b. 05/2018 L SFA/popliteal stenting.   Past Surgical History:  Procedure Laterality Date   COLONOSCOPY     COLONOSCOPY WITH PROPOFOL  N/A 07/23/2015   Procedure: COLONOSCOPY WITH PROPOFOL ;  Surgeon: Marshall Skeeter, MD;  Location: [facility] ENDOSCOPY;  Service: Endoscopy;  Laterality: N/A;   COLONOSCOPY WITH PROPOFOL  N/A 04/20/2022   Procedure: COLONOSCOPY WITH PROPOFOL ;  Surgeon: Luke Salaam, MD;  Location: [facility] ENDOSCOPY;  Service: Gastroenterology;  Laterality: N/A;   ESOPHAGOGASTRODUODENOSCOPY (EGD) WITH PROPOFOL  N/A 08/20/2015   Procedure: ESOPHAGOGASTRODUODENOSCOPY (EGD) WITH PROPOFOL ;  Surgeon: Marshall Skeeter, MD;  Location: [facility] ENDOSCOPY;  Service: Endoscopy;  Laterality: N/A;   LOWER EXTREMITY ANGIOGRAPHY Left 05/19/2018   Procedure: LOWER EXTREMITY ANGIOGRAPHY;  Surgeon: Celso College, MD;  Location: [facility] INVASIVE CV LAB;  Service: Cardiovascular;  Laterality: Left;   LOWER EXTREMITY ANGIOGRAPHY Right 06/15/2018   Procedure:  LOWER EXTREMITY ANGIOGRAPHY;  Surgeon: Celso College, MD;  Location: [facility] INVASIVE CV LAB;  Service: Cardiovascular;  Laterality: Right;   lymp node removal Right 2011   arm   left arm 2015   LYMPH NODE BIOPSY  08/2014    Allergies  Allergies  Allergen Reactions   Penicillins     Other reaction(s): Rash, Hives ()   Latex Rash      History of Present Illness      74 y.o. y/o male with a history of CAD, hyperlipidemia, sinus bradycardia, peripheral arterial disease status post lower extremity interventions, CLL, anemia, and tobacco abuse.  He previously established care in May 2022 in the setting of bradycardia, fatigue, chest tightness, and dyspnea.  Underwent coronary CT angiogram in June 2022 revealing severe first diagonal disease and otherwise mild, nonobstructive disease.  Decision was made to pursue medical therapy given history of anemia and improvement in chest pain symptoms.  An echocardiogram in June 2023 showed an EF of 55-60% without regional wall motion abnormalities, mild MR and AI.    [name] was last seen in cardiology clinic in December 2023, at which time he was not having any chest pain.  Since then, he has continued to do well from a cardiac standpoint.  He has chronic but stable dyspnea on exertion.   Over the past 3 yrs, he has experienced progressive bilat LE claudication.  He would like to f/u w/ Dr. Vonna Guardian.  He usually notes leg pain after walking about 60 yds.  He has not  noticed any change in lower extremity sensation, hair distribution, or temperature.  Over the past month or so, he has noted some breast tenderness and swelling.  No recent changes to meds.  He denies palpitations, pnd, orthopnea, n, v, dizziness, syncope, edema, weight gain, or early satiety.    Objective  Home Medications    Current Outpatient Medications  Medication Sig Dispense Refill   aspirin  EC 81 MG tablet Take 81 mg by mouth daily. Swallow whole. 90 tablet 3   guaiFENesin  (MUCINEX ) 600 MG  12 hr tablet Take 2 tablets (1,200 mg total) by mouth 2 (two) times daily. 28 tablet 3   pantoprazole  (PROTONIX ) 40 MG tablet Take 1 tablet (40 mg total) by mouth daily. Prior to breakfast, 30 min to 1 hr 90 tablet 3   rosuvastatin  (CRESTOR ) 20 MG tablet Take 1 tablet (20 mg total) by mouth daily. 90 tablet 3   tamsulosin  (FLOMAX ) 0.4 MG CAPS capsule Take 2 capsules (0.8 mg total) by mouth daily. 180 capsule 3   zanubrutinib  (BRUKINSA ) 80 MG capsule Take 2 capsules (160 mg total) by mouth 2 (two) times daily. 120 capsule 3   traZODone  (DESYREL ) 50 MG tablet Take 0.5-1 tablets (25-50 mg total) by mouth at bedtime. (Patient not taking: Reported on 04/20/2024) 90 tablet 3   No current facility-administered medications for this visit.     Physical Exam    VS:  BP 110/60 (BP Location: Left Arm, Patient Position: Sitting, Cuff Size: Normal)   Pulse (!) 49   Ht 5\' 8"  (1.727 m)   Wt 142 lb 4 oz (64.5 kg)   SpO2 98%   BMI 21.63 kg/m  , BMI Body mass index is 21.63 kg/m.       GEN: Well nourished, well developed, in no acute distress. HEENT: normal. Neck: Supple, no JVD, carotid bruits, or masses. Cardiac: RRR, no murmurs, rubs, or gallops. No clubbing, cyanosis, edema.  Radials 2+/PT 2+ and equal bilaterally.  Mild breast tenderness to palpation. Respiratory:  Respirations regular and unlabored, clear to auscultation bilaterally. GI: Soft, nontender, nondistended, BS + x 4. MS: no deformity or atrophy. Skin: warm and dry, no rash. Neuro:  Strength and sensation are intact. Psych: Normal affect.  Accessory Clinical Findings    ECG personally reviewed by me today - EKG Interpretation Date/Time:  Thursday Apr 20 2024 10:20:56 EDT Ventricular Rate:  49 PR Interval:  146 QRS Duration:  84 QT Interval:  440 QTC Calculation: 397 R Axis:   -47  Text Interpretation: Sinus bradycardia Left anterior fascicular block T wave abnormality, consider lateral ischemia Confirmed by Laneta Pintos  [phone]) on 04/20/2024 10:32:17 AM  - no acute changes.  Lab Results  Component Value Date   WBC 9.0 01/26/2024   HGB 12.7 (L) 01/26/2024   HCT 40.6 01/26/2024   MCV 78.2 (L) 01/26/2024   PLT 178 01/26/2024   Lab Results  Component Value Date   CREATININE 0.99 02/02/2024   BUN 11 02/02/2024   NA 138 02/02/2024   K 4.8 02/02/2024   CL 106 02/02/2024   CO2 25 02/02/2024   Lab Results  Component Value Date   ALT 15 01/26/2024   AST 18 01/26/2024   ALKPHOS 38 01/26/2024   BILITOT 1.1 01/26/2024   Lab Results  Component Value Date   CHOL 173 04/20/2023   HDL 35.10 (L) 04/20/2023   LDLCALC 124 (H) 04/20/2023   TRIG 71.0 04/20/2023   CHOLHDL 5 04/20/2023    Lab Results  Component Value Date   HGBA1C 5.9 01/16/2022   Lab Results  Component Value Date   TSH 1.76 09/08/2023       Assessment & Plan    1.  CAD:  significant diag dzs on coronary CTA in 05/2021.  He has been medically managed.  He does not experience angina.  He has chronic DOE, which has been stable.  Cont asa and statin rx.  2.  PAD:  s/p prior bilat LE interventions.  He has not f/u in several years and has noted progressive bilat claudication over the past 3 yrs.  I offered ABI's, but he prefers to f/u w/ Dr. Vonna Guardian.  Referral placed.  Cont asa/statin.  3.  HL:  on rosuvastatin .  LDL 124 last year, which was up from years prior - unsure if he was compliant w/ statin at that time.  He is due for f/u lipids, though is not fasting today.  We provided him an order for fasting lipids as [facility].  Pending result, suspect he will need escalation of rx.  4.  Tobacco Abuse:  Cessation advised.  5.  CLL:  followed by oncology.  6.  Anemia:  recently stable.  Due for f/u labs next week.  7.  Disposition:  f/u lipids.  Refer to [facility].  F/u here in 1 year or sooner if necessary.  Laneta Pintos, NP 04/20/2024, 4:21 PM

## 2024-04-20 NOTE — Patient Instructions (Addendum)
 Medication Instructions:  No changes   *If you need a refill on your cardiac medications before your next appointment, please call your pharmacy*   Lab Work: Lipid  - fasting   ( please have it done at the [facility])   If you have labs (blood work) drawn today and your tests are completely normal, you will receive your results only by: MyChart Message (if you have MyChart) OR A paper copy in the mail If you have any lab test that is abnormal or we need to change your treatment, we will call you to review the results.   Testing/Procedures: Not needed   Follow-Up: At [facility], you and your health needs are our priority.  As part of our continuing mission to provide you with exceptional heart care, we have created designated Provider Care Teams.  These Care Teams include your primary Cardiologist (physician) and Advanced Practice Providers (APPs -  Physician Assistants and Nurse Practitioners) who all work together to provide you with the care you need, when you need it.     Your next appointment:   12 month(s)  The format for your next appointment:   In Person  Provider:   Timothy Gollan, MD   Other Instructions   You have been referred to  Dr Mikki Alexander for claudication

## 2024-04-24 ENCOUNTER — Other Ambulatory Visit: Payer: Self-pay

## 2024-04-24 ENCOUNTER — Encounter: Payer: Self-pay | Admitting: Oncology

## 2024-04-24 ENCOUNTER — Inpatient Hospital Stay: Payer: Medicare HMO | Attending: Oncology

## 2024-04-24 ENCOUNTER — Inpatient Hospital Stay (HOSPITAL_BASED_OUTPATIENT_CLINIC_OR_DEPARTMENT_OTHER): Payer: Medicare HMO | Admitting: Oncology

## 2024-04-24 VITALS — BP 106/75 | HR 58 | Temp 97.8°F | Resp 18 | Wt 138.2 lb

## 2024-04-24 DIAGNOSIS — F17290 Nicotine dependence, other tobacco product, uncomplicated: Secondary | ICD-10-CM | POA: Diagnosis not present

## 2024-04-24 DIAGNOSIS — D649 Anemia, unspecified: Secondary | ICD-10-CM

## 2024-04-24 DIAGNOSIS — Z5111 Encounter for antineoplastic chemotherapy: Secondary | ICD-10-CM

## 2024-04-24 DIAGNOSIS — Z720 Tobacco use: Secondary | ICD-10-CM

## 2024-04-24 DIAGNOSIS — C9110 Chronic lymphocytic leukemia of B-cell type not having achieved remission: Secondary | ICD-10-CM

## 2024-04-24 DIAGNOSIS — D509 Iron deficiency anemia, unspecified: Secondary | ICD-10-CM | POA: Diagnosis not present

## 2024-04-24 DIAGNOSIS — Z9221 Personal history of antineoplastic chemotherapy: Secondary | ICD-10-CM | POA: Diagnosis not present

## 2024-04-24 DIAGNOSIS — D563 Thalassemia minor: Secondary | ICD-10-CM

## 2024-04-24 LAB — CBC WITH DIFFERENTIAL (CANCER CENTER ONLY)
Abs Immature Granulocytes: 0.13 10*3/uL — ABNORMAL HIGH (ref 0.00–0.07)
Basophils Absolute: 0.1 10*3/uL (ref 0.0–0.1)
Basophils Relative: 1 %
Eosinophils Absolute: 0.2 10*3/uL (ref 0.0–0.5)
Eosinophils Relative: 3 %
HCT: 39 % (ref 39.0–52.0)
Hemoglobin: 12.2 g/dL — ABNORMAL LOW (ref 13.0–17.0)
Immature Granulocytes: 2 %
Lymphocytes Relative: 44 %
Lymphs Abs: 3.6 10*3/uL (ref 0.7–4.0)
MCH: 24.4 pg — ABNORMAL LOW (ref 26.0–34.0)
MCHC: 31.3 g/dL (ref 30.0–36.0)
MCV: 78 fL — ABNORMAL LOW (ref 80.0–100.0)
Monocytes Absolute: 0.8 10*3/uL (ref 0.1–1.0)
Monocytes Relative: 10 %
Neutro Abs: 3.2 10*3/uL (ref 1.7–7.7)
Neutrophils Relative %: 40 %
Platelet Count: 258 10*3/uL (ref 150–400)
RBC: 5 MIL/uL (ref 4.22–5.81)
RDW: 17.5 % — ABNORMAL HIGH (ref 11.5–15.5)
WBC Count: 8.1 10*3/uL (ref 4.0–10.5)
nRBC: 0 % (ref 0.0–0.2)

## 2024-04-24 LAB — CMP (CANCER CENTER ONLY)
ALT: 16 U/L (ref 0–44)
AST: 21 U/L (ref 15–41)
Albumin: 4 g/dL (ref 3.5–5.0)
Alkaline Phosphatase: 48 U/L (ref 38–126)
Anion gap: 8 (ref 5–15)
BUN: 11 mg/dL (ref 8–23)
CO2: 24 mmol/L (ref 22–32)
Calcium: 8.6 mg/dL — ABNORMAL LOW (ref 8.9–10.3)
Chloride: 103 mmol/L (ref 98–111)
Creatinine: 1.11 mg/dL (ref 0.61–1.24)
GFR, Estimated: >60 mL/min (ref >60)
Glucose, Bld: 140 mg/dL — ABNORMAL HIGH (ref 70–99)
Potassium: 4.3 mmol/L (ref 3.5–5.1)
Sodium: 135 mmol/L (ref 135–145)
Total Bilirubin: 0.8 mg/dL (ref 0.0–1.2)
Total Protein: 6.6 g/dL (ref 6.5–8.1)

## 2024-04-24 LAB — LACTATE DEHYDROGENASE: LDH: 118 U/L (ref 98–192)

## 2024-04-24 MED ORDER — BRUKINSA 80 MG PO CAPS
160.0000 mg | ORAL_CAPSULE | Freq: Two times a day (BID) | ORAL | 3 refills | Status: DC
  Filled 2024-04-24 – 2024-04-26 (×2): qty 120, 30d supply, fill #0
  Filled 2024-06-19: qty 120, 30d supply, fill #1
  Filled 2024-07-13: qty 120, 30d supply, fill #2

## 2024-04-24 NOTE — Assessment & Plan Note (Addendum)
 Hb has improved and been stable at baseline

## 2024-04-24 NOTE — Assessment & Plan Note (Addendum)
 Recurrent CLL, trisomy 12 and TP53 mutated, IgVH Somatic Hypermutation unmutated He did not Acalabrutinib  due to diarrhea. He prefers not to try reduced dose, S/p Rituximab  weekly x 4, CT showed progression--> Abdominal mass biopsy confirmed CLL, no evidence of transformation--> Zanubrutinib  160 mg twice daily. --? 06/2023 CT showed partial response.  Labs reviewed and discussed with patient. Wbc has normalized Overall he tolerates Zanubrutinib  160 mg twice daily.  Recommend patient to continue current regimen.

## 2024-04-24 NOTE — Assessment & Plan Note (Signed)
 Recommend smoke cessation.

## 2024-04-24 NOTE — Assessment & Plan Note (Signed)
 Beta thalassemia minor, with microcytic anemia. Stable counts.

## 2024-04-24 NOTE — Progress Notes (Signed)
 Hematology/Oncology Progress note [phone] [phone]   Chief Complaint: [name] is a 73 y.o.  male presents to follow up for chronic lymphocytic lymphoma.   ASSESSMENT & PLAN:   Cancer Staging  Chronic lymphocytic leukemia (CLL), B-cell (HCC) Staging form: Chronic Lymphocytic Leukemia / Small Lymphocytic Lymphoma, AJCC 8th Edition - Clinical stage from 07/23/2017: Modified Rai Stage III (Modified Rai risk: High, Lymphocytosis: Present, Adenopathy: Present, Organomegaly: Absent, Anemia: Present, Thrombocytopenia: Absent) - Signed by Timmy Forbes, MD on 02/19/2022   Chronic lymphocytic leukemia (CLL), B-cell (HCC) Recurrent CLL, trisomy 12 and TP53 mutated, IgVH Somatic Hypermutation unmutated He did not Acalabrutinib  due to diarrhea. He prefers not to try reduced dose, S/p Rituximab  weekly x 4, CT showed progression--> Abdominal mass biopsy confirmed CLL, no evidence of transformation--> Zanubrutinib  160 mg twice daily. --? 06/2023 CT showed partial response.  Labs reviewed and discussed with patient. Wbc has normalized Overall he tolerates Zanubrutinib  160 mg twice daily.  Recommend patient to continue current regimen.   Encounter for antineoplastic chemotherapy CLL treatment as listed above  Normocytic anemia Hb has improved and been stable at baseline  Tobacco abuse Recommend smoke cessation.  Beta thalassemia minor Beta thalassemia minor, with microcytic anemia. Stable counts.   Hypocalcemia Recommend otc calcium  supplementation.    Follow-up  3 months lab MD    All questions were answered. The patient knows to call the clinic with any problems, questions or concerns.  Timmy Forbes, MD, PhD [facility] Hematology Oncology 04/24/2024     HPI:   Oncology History  Chronic lymphocytic leukemia (CLL), B-cell (HCC)  09/13/2014 Initial Diagnosis   CLL - hemolytic anemia in 01/2014.  He was treated with prednisone . 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  -Oct 2020 second opinion at [facility] and was evaluated by heme-onc Dr.Ellis.  Patient was recommended for watchful waiting   10/02/2014 - 10/23/2014 Chemotherapy   -10/02/2014- 10/23/2014  4 weekly cycles of Rituxan   -07/23/2017 Decision was made to start Ibrutinib  as patient is very symptomatic (fatigue and weight loss, lack of appetite). Patient did not show up at the chemo education class   05/08/2022 Imaging   CT CHEST, ABDOMEN, AND PELVIS WITH CONTRAST 1 unchanged enlarged bilateral axillary lymph nodes and prominent pretracheal mediastinal lymph nodes. 2. However, when compared to most recent prior CT examination of the abdomen and pelvis dated 06/24/2016, there are numerous, extremely bulky lymph nodes throughout the abdomen and pelvis, which are greatly increased in size. Newly enlarged left inguinal lymph nodes, in keeping with reported palpable lymphadenopathy. 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 3. Mild right hydronephrosis; the proximal ureter appears compressed or obstructed by overlying lymphadenopathy. 4. Emphysema.5. Coronary artery disease.   05/30/2022 Imaging   PET scan showed  1. Again seen is massive nodal conglomeration within the abdomen with mildly diffuse increased tracer uptake with SUV max of 3.15.There also enlarged bilateral pelvic and left inguinal lymph nodes which exhibit mild increased tracer uptake compatible with residual/recurrent lymphoma. Imaging findings are compatible with residual/recurrent metabolically active low-grade lymphoma. 2. No significant tracer uptake associated with the borderline enlarged bilateral axillary lymph nodes.3. Unchanged appearance of small nonspecific subpleural nodules within the periphery of the upper lobes measuring up to 4 mm. These are  too small to characterize by PET-CT.4. Aortic Atherosclerosis (ICD10-I70.0) and Emphysema (ICD10-J43.9). 5. Coronary  artery calcifications. 6. Normal size spleen with background activity similar to the liver   06/22/2022 -  Chemotherapy   acalabrutinib  100 mg twice daily.   07/27/2022 - 08/20/2022 Chemotherapy   Patient is on Treatment Plan : ITP Rituximab  q7d x 4 cycles     11/09/2022 Imaging   CT abdomen pelvis w contrast  1. Progressive bulky retroperitoneal and mesenteric lymphadenopathy consistent with progressive lymphoma. 2. No evidence of bowel obstruction, perforation or abscess. 3. Stable small amount of free pelvic fluid. 4. Aortic Atherosclerosis (ICD10-I70.0). Chronically occluded right femoral arterial stent    11/09/2022 [facility] Admission   Patient presented to emergency room for evaluation of shortness of breath.  He has noticed progressive worsening of abdominal swelling and distention.  Associated with shortness of breath.  Patient was started on diuretics.     11/24/2022 Procedure   CT guided biopsy of abdomen mass  Pathology showed CLL/SLL Core biopsy sections demonstrate lymphoid tissue with partially effaced follicular architecture.  The bulk of the tissue consists of sheets of small lymphocytes with dense chromatin and scant cytoplasm.  There is no significant increase in larger prolymphocytic type cells.  Relatively few small germinal centers are appreciated.  Concurrent flow cytometric studies demonstrate a CD5+, CD23 + monoclonal B-cell population comprising 87% of total lymphoid cells and 100% of B cells.  The phenotype is compatible with chronic lymphocytic leukemia/small lymphocytic lymphoma.  There are no features in the current sample to suggest progression to higher grade process. Clinical and radiographic correlation is recommended.    12/09/2022 -  Chemotherapy   Zanubrutinib  160 mg twice daily     INTERVAL HISTORY 73 y.o. male with history of CLL  presents for follow-up. Patient has been on Zanubrutinib  160 mg twice daily, He has no new complaints.  Denies any nausea vomiting diarrhea, denies abdominal pain.    Past Medical History:  Diagnosis Date   Anemia 2008   Arthritis    CAD (coronary artery disease)    a. 05/2021 Cor CTA: LM nl, LAD 25-49%, D1 >70 (FFRct 0.73), LCXnl, RCA <25% throughout-->Med rx.   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 Wilhelmenia Harada   History of echocardiogram    a. 05/2022 Echo: EF 55-60%, no rwma, nl RV fxn, mildly dil RA, mild MR, mild AI. Asc Ao 36mm.   Hyperlipidemia    Low back pain    Peripheral vascular disease (HCC)    a. 04/2018 R SFA/popliteal stenting; b. 05/2018 L SFA/popliteal stenting.    Past Surgical History:  Procedure Laterality Date   COLONOSCOPY     COLONOSCOPY WITH PROPOFOL  N/A 07/23/2015   Procedure: COLONOSCOPY WITH PROPOFOL ;  Surgeon: Marshall Skeeter, MD;  Location: [facility] ENDOSCOPY;  Service: Endoscopy;  Laterality: N/A;   COLONOSCOPY WITH PROPOFOL  N/A 04/20/2022   Procedure: COLONOSCOPY WITH PROPOFOL ;  Surgeon: Luke Salaam, MD;  Location[facility] ENDOSCOPY;  Service: Gastroenterology;  Laterality: N/A;   ESOPHAGOGASTRODUODENOSCOPY (EGD) WITH PROPOFOL  N/A 08/20/2015   Procedure: ESOPHAGOGASTRODUODENOSCOPY (EGD) WITH PROPOFOL ;  Surgeon: Marshall Skeeter, MD;  Location: [facility] ENDOSCOPY;  Service: Endoscopy;  Laterality: N/A;   LOWER EXTREMITY ANGIOGRAPHY Left 05/19/2018   Procedure: LOWER EXTREMITY ANGIOGRAPHY;  Surgeon: Celso College, MD;  Location: [facility] INVASIVE CV LAB;  Service: Cardiovascular;  Laterality: Left;   LOWER EXTREMITY ANGIOGRAPHY Right 06/15/2018   Procedure: LOWER EXTREMITY ANGIOGRAPHY;  Surgeon: Celso College, MD;  Location: [facility] INVASIVE CV LAB;  Service: Cardiovascular;  Laterality:  Right;   lymp node removal Right 2011   arm   left arm 2015   LYMPH NODE BIOPSY  08/2014    Family History  Problem Relation Age of Onset   Alcohol abuse  Mother    Cirrhosis Mother    Heart attack Father    Kidney cancer Neg Hx        lung cancer   Bladder Cancer Neg Hx    Prostate cancer Neg Hx     Social History:  reports that he has been smoking cigars. He has never used smokeless tobacco. He reports that he does not drink alcohol and does not use drugs.  Allergies:  Allergies  Allergen Reactions   Penicillins     Other reaction(s): Rash, Hives ()   Latex Rash   Review of Systems:  Review of Systems  Constitutional:  Negative for chills, fever, malaise/fatigue and weight loss.  HENT:  Negative for ear discharge, ear pain, nosebleeds and sore throat.   Eyes:  Negative for photophobia, pain and redness.  Respiratory:  Negative for cough, hemoptysis, sputum production, shortness of breath and wheezing.   Cardiovascular:  Negative for chest pain, palpitations and leg swelling.  Gastrointestinal:  Negative for abdominal pain, blood in stool, constipation, diarrhea, heartburn, nausea and vomiting.  Genitourinary:  Negative for dysuria and frequency.  Musculoskeletal:  Negative for myalgias and neck pain.  Skin:  Negative for rash.  Neurological:  Negative for dizziness, tingling, tremors, weakness and headaches.  Endo/Heme/Allergies:  Does not bruise/bleed easily.  Psychiatric/Behavioral:  Negative for depression, hallucinations and suicidal ideas.      Current Outpatient Medications on File Prior to Visit  Medication Sig Dispense Refill   aspirin  EC 81 MG tablet Take 81 mg by mouth daily. Swallow whole. 90 tablet 3   guaiFENesin  (MUCINEX ) 600 MG 12 hr tablet Take 2 tablets (1,200 mg total) by mouth 2 (two) times daily. 28 tablet 3   pantoprazole  (PROTONIX ) 40 MG tablet Take 1 tablet (40 mg total) by mouth daily. Prior to breakfast, 30 min to 1 hr 90 tablet 3   rosuvastatin  (CRESTOR ) 20 MG tablet Take 1 tablet (20 mg total) by mouth daily. 90 tablet 3   tamsulosin  (FLOMAX ) 0.4 MG CAPS capsule Take 2 capsules (0.8 mg total) by  mouth daily. 180 capsule 3   traZODone  (DESYREL ) 50 MG tablet Take 0.5-1 tablets (25-50 mg total) by mouth at bedtime. 90 tablet 3   No current facility-administered medications on file prior to visit.    Physical Exam: Blood pressure 106/75, pulse (!) 58, temperature 97.8 F (36.6 C), resp. rate 18, weight 138 lb 3.2 oz (62.7 kg), SpO2 100%. Physical Exam Constitutional:      General: He is not in acute distress.    Appearance: He is not diaphoretic.  HENT:     Head: Normocephalic.  Eyes:     General: No scleral icterus. Cardiovascular:     Rate and Rhythm: Normal rate and regular rhythm.  Pulmonary:     Effort: Pulmonary effort is normal. No respiratory distress.     Breath sounds: No wheezing.  Abdominal:     General: Bowel sounds are normal. There is no distension.     Palpations: Abdomen is soft. There is no mass.  Genitourinary:    Comments:   Musculoskeletal:        General: Normal range of motion.     Cervical back: Normal range of motion.  Skin:    General: Skin is  warm and dry.     Findings: No erythema.  Neurological:     Mental Status: He is alert and oriented to person, place, and time. Mental status is at baseline.     Cranial Nerves: No cranial nerve deficit.     Motor: No abnormal muscle tone.  Psychiatric:        Mood and Affect: Mood and affect normal.     LABORATORY RESULTS.    Latest Ref Rng & Units 04/24/2024   10:10 AM 01/26/2024   10:28 AM 12/29/2023    9:31 AM  CBC  WBC 4.0 - 10.5 K/uL 8.1  9.0  9.9   Hemoglobin 13.0 - 17.0 g/dL 09.8  11.9  14.7   Hematocrit 39.0 - 52.0 % 39.0  40.6  37.1   Platelets 150 - 400 K/uL 258  178  157.0       Latest Ref Rng & Units 04/24/2024   10:10 AM 02/02/2024   10:39 AM 01/26/2024   10:29 AM  CMP  Glucose 70 - 99 mg/dL 829  562  130   BUN 8 - 23 mg/dL 11  11  10    Creatinine 0.61 - 1.24 mg/dL 8.65  7.84  6.96   Sodium 135 - 145 mmol/L 135  138  136   Potassium 3.5 - 5.1 mmol/L 4.3  4.8  4.2   Chloride 98 -  111 mmol/L 103  106  103   CO2 22 - 32 mmol/L 24  25  24    Calcium  8.9 - 10.3 mg/dL 8.6  8.9  8.8   Total Protein 6.5 - 8.1 g/dL 6.6   6.4   Total Bilirubin 0.0 - 1.2 mg/dL 0.8   1.1   Alkaline Phos 38 - 126 U/L 48   38   AST 15 - 41 U/L 21   18   ALT 0 - 44 U/L 16   15      PATHOLOGY:  11/02/2016 Peripheral blood FISH studies revealed 20% of nuclei positive for loss of 1 ATM signal, 63% of nuclei positive for trisomy 12, and 32% of nuclei positive for loss of 1 TP53 signal. Results for CCND1/IGH and 13q were normal.  09/13/2014 Left axillary node biopsy on  confirmed B-cell small lymphocytic lymphoma (B-CLL/SLL)   IMAGE STUDIES No results found.

## 2024-04-24 NOTE — Assessment & Plan Note (Signed)
Recommend otc calcium supplementation.

## 2024-04-24 NOTE — Assessment & Plan Note (Signed)
CLL treatment as listed above 

## 2024-04-25 ENCOUNTER — Other Ambulatory Visit: Payer: Self-pay

## 2024-04-26 ENCOUNTER — Other Ambulatory Visit: Payer: Self-pay

## 2024-04-26 NOTE — Progress Notes (Signed)
 Specialty Pharmacy Refill Coordination Note  [name] is a 73 y.o. male contacted today regarding refills of specialty medication(s) Zanubrutinib  (Brukinsa )   Patient requested Delivery   Delivery date: 05/01/24   Verified address: [address]  [facility] [place] [address]   Medication will be filled on 05.09.25.

## 2024-04-28 ENCOUNTER — Other Ambulatory Visit: Payer: Self-pay

## 2024-05-09 ENCOUNTER — Encounter (INDEPENDENT_AMBULATORY_CARE_PROVIDER_SITE_OTHER): Payer: Self-pay

## 2024-05-16 ENCOUNTER — Encounter (INDEPENDENT_AMBULATORY_CARE_PROVIDER_SITE_OTHER): Payer: Self-pay | Admitting: Vascular Surgery

## 2024-05-16 ENCOUNTER — Encounter (INDEPENDENT_AMBULATORY_CARE_PROVIDER_SITE_OTHER): Payer: Self-pay

## 2024-05-26 ENCOUNTER — Other Ambulatory Visit: Payer: Self-pay

## 2024-05-29 ENCOUNTER — Other Ambulatory Visit: Payer: Self-pay

## 2024-06-01 ENCOUNTER — Other Ambulatory Visit: Payer: Self-pay

## 2024-06-08 ENCOUNTER — Other Ambulatory Visit: Payer: Self-pay

## 2024-06-12 ENCOUNTER — Other Ambulatory Visit: Payer: Self-pay

## 2024-06-19 ENCOUNTER — Other Ambulatory Visit: Payer: Self-pay | Admitting: Pharmacy Technician

## 2024-06-19 ENCOUNTER — Other Ambulatory Visit: Payer: Self-pay

## 2024-06-19 ENCOUNTER — Other Ambulatory Visit (HOSPITAL_COMMUNITY): Payer: Self-pay

## 2024-06-19 NOTE — Progress Notes (Signed)
 Specialty Pharmacy Refill Coordination Note  [name] is a 73 y.o. male contacted today regarding refills of specialty medication(s) Zanubrutinib  (Brukinsa )   Patient requested Delivery   Delivery date: 06/20/24   Verified address: [address]  [place]   Medication will be filled on 06/19/24.

## 2024-06-30 ENCOUNTER — Ambulatory Visit (INDEPENDENT_AMBULATORY_CARE_PROVIDER_SITE_OTHER): Payer: Self-pay | Admitting: Vascular Surgery

## 2024-06-30 ENCOUNTER — Other Ambulatory Visit (INDEPENDENT_AMBULATORY_CARE_PROVIDER_SITE_OTHER): Payer: Self-pay | Admitting: Nurse Practitioner

## 2024-06-30 ENCOUNTER — Ambulatory Visit (INDEPENDENT_AMBULATORY_CARE_PROVIDER_SITE_OTHER): Payer: Self-pay

## 2024-06-30 ENCOUNTER — Encounter (INDEPENDENT_AMBULATORY_CARE_PROVIDER_SITE_OTHER): Payer: Self-pay | Admitting: Vascular Surgery

## 2024-06-30 ENCOUNTER — Telehealth (INDEPENDENT_AMBULATORY_CARE_PROVIDER_SITE_OTHER): Payer: Self-pay

## 2024-06-30 VITALS — BP 109/63 | HR 50 | Resp 18 | Wt 140.0 lb

## 2024-06-30 DIAGNOSIS — E785 Hyperlipidemia, unspecified: Secondary | ICD-10-CM | POA: Diagnosis not present

## 2024-06-30 DIAGNOSIS — M79605 Pain in left leg: Secondary | ICD-10-CM

## 2024-06-30 DIAGNOSIS — M79604 Pain in right leg: Secondary | ICD-10-CM | POA: Diagnosis not present

## 2024-06-30 DIAGNOSIS — I739 Peripheral vascular disease, unspecified: Secondary | ICD-10-CM | POA: Diagnosis not present

## 2024-06-30 DIAGNOSIS — Z9889 Other specified postprocedural states: Secondary | ICD-10-CM

## 2024-06-30 DIAGNOSIS — I70213 Atherosclerosis of native arteries of extremities with intermittent claudication, bilateral legs: Secondary | ICD-10-CM

## 2024-06-30 DIAGNOSIS — Z720 Tobacco use: Secondary | ICD-10-CM | POA: Diagnosis not present

## 2024-06-30 NOTE — Telephone Encounter (Signed)
 Spoke to the patient and he is scheduled with Dr. Marea for a RLE (07/10/24 - 10:00 am), LLE (07/17/24 - 8:00 am) arrivals to the [facility]. Pre-procedure instructions were discussed and will be sent to Mychart and mailed.

## 2024-06-30 NOTE — Assessment & Plan Note (Signed)
 Noninvasive studies were performed today showing a right ABI of 0.68 and a left ABI 0.89.  Duplex was performed showing SFA occlusions bilaterally.   We had a Long discussion with the patient today.  Patient has no immediate limb threatening symptoms and intervention would only be recommended if his symptoms are disabling and keeping him from doing his normal daily activities.  He is fairly adamant that his symptoms are keeping him from his normal activities and he would definitely desire intervention to try to improve his symptoms at this time.  His right leg is the more severely affected of the 2 legs and has the more limited perfusion by his noninvasive studies, so we would plan on performing this first.  Pending his results from the right leg, we could then proceed with left leg intervention shortly thereafter.  I discussed the risks and benefits of the procedure.  Patient voices his understanding and desires to proceed.

## 2024-06-30 NOTE — Assessment & Plan Note (Signed)
 lipid control important in reducing the progression of atherosclerotic disease. Continue statin therapy

## 2024-06-30 NOTE — Patient Instructions (Signed)
 Angiogram  An angiogram is an X-ray test. It is used to check for problems in the blood vessels. In this test, a dye is put into the blood vessel through a soft tube (catheter). The procedure helps your doctor see if there are problem in the blood vessel. The catheter may go through: Your upper leg area (groin). The fold of your arm, near your elbow. Your wrist. Tell your doctor about: Any allergies you have. This includes allergies to medicines, shellfish, contrast dye, or iodine. All medicines you are taking, including vitamins, herbs, eye drops, creams, and over-the-counter medicines. Any problems you or family members have had with anesthesia. Any bleeding problems you have. Any surgeries you have had. Any medical conditions you have or have had, including any kidney problems or kidney failure. Whether you are pregnant or may be pregnant. Whether you are breastfeeding. Any condition that may prevent you from lying still. This includes anxiety or long-term pain. What are the risks? Your doctor will talk with you about risks. These may include: Infection. Bleeding. Bruising. Allergic reactions to medicines or dyes. Damage to blood vessels. Also, the dye may damage the kidneys. Blood clots that can lead to a stroke or heart attack. Death. What happens before the procedure? When to stop eating and drinking Follow instructions from your health care provider about what you may eat and drink before your procedure. These may include: 8 hours before your procedure Stop eating most foods. Do not eat meat, fried foods, or fatty foods. Eat only light foods, such as toast or crackers. All liquids are okay except energy drinks and alcohol. 6 hours before your procedure Stop eating. Drink only clear liquids, such as water, clear fruit juice, black coffee, plain tea, and sports drinks. Do not drink energy drinks or alcohol. 2 hours before your procedure Stop drinking all liquids. You may  be allowed to take medicines with small sips of water. If you do not follow your health care provider's instructions, your procedure may be delayed or canceled. Medicines Ask your doctor about changing or stopping: Your normal medicines. Vitamins, herbs, and supplements. Over-the-counter medicines. Do not take aspirin  or ibuprofen unless you are told to. Surgery safety For your safety, your doctor may: Oneil the area of surgery. Remove hair at the surgery site. Ask you to wash with a soap that kills germs. Give you antibiotics. General instructions Do not smoke or use any products that contain nicotine or tobacco for at least 4 weeks before the procedure. If you need help quitting, ask your doctor. Blood samples may be taken. If you will be going home right after the procedure, plan to have a responsible adult: Take you home from the hospital or clinic. You will not be allowed to drive. Care for you for the time you are told. What happens during the procedure? You will lie on your back on an X-ray table. An IV will be put into one of your veins. Small patches (electrodes) may be placed on your chest. This will be used to check your heart rate during the test. You may be given: A sedative. This helps you relax. Anesthesia. This will numb the area where the catheter will be inserted. A small cut will be made for the catheter. The catheter will be inserted into an artery using a guide wire. Your doctor will move the catheter into the blood vessel to check for problems. Dye will be put in through the catheter. X-rays of your blood vessels will then  be taken. Tell your doctor if you have chest pain or trouble breathing. After the X-ray is done, the catheter will be taken out. A bandage will be placed over the site where the incision was made. Pressure will be applied to help stop any bleeding. The procedure may vary among doctors and hospitals. What happens after the procedure? You will  be monitored until you leave the hospital or clinic. This includes checking your blood pressure, heart rate, breathing rate, and blood oxygen. You will be kept in bed lying flat for some time. The insertion area and the pulse in your feet or wrist will be checked. You will be told to drink plenty of fluids. This will help get the dye out of your body. More blood tests and X-rays may be done. Tests to check your heart may be done. Do not drive or use machines until your doctor says that it is safe. It is up to you to get your test results. Ask your health care provider, or the department that is doing the test, when your results will be ready. This information is not intended to replace advice given to you by your health care provider. Make sure you discuss any questions you have with your health care provider. Document Revised: 07/01/2022 Document Reviewed: 07/01/2022 Elsevier Patient Education  2024 [employer].

## 2024-06-30 NOTE — Progress Notes (Signed)
 Patient ID: [name], male   DOB: 19-Apr-1951, 73 y.o.   MRN: [id number]  Chief Complaint  Patient presents with   New Patient (Initial Visit)    Ref Vivienne consult PVD    HPI [name] is a 73 y.o. male.  I am asked to see the patient by Lonni Vivienne for evaluation of peripheral arterial disease.  He was previously seen in our practice but has not been seen for approximately 6 years.  He has undergone bilateral SFA and popliteal intervention back in 2019.  Over the past year or 2, he has noticed worsening claudication symptoms of the lower extremities.  These become quite disabling particularly in the right leg.  He denies any open wounds, infection, or ischemic rest pain.  He can only now walk short distances and can no longer jog like he enjoys.  His symptoms are similar if not worse than they were when he initially underwent intervention 6 years ago.  Noninvasive studies were performed today showing a right ABI of 0.68 and a left ABI 0.89.  Duplex was performed showing SFA occlusions bilaterally.     Past Medical History:  Diagnosis Date   Anemia 2008   Arthritis    CAD (coronary artery disease)    a. 05/2021 Cor CTA: LM nl, LAD 25-49%, D1 >70 (FFRct 0.73), LCXnl, RCA <25% throughout-->Med rx.   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 Babara   History of echocardiogram    a. 05/2022 Echo: EF 55-60%, no rwma, nl RV fxn, mildly dil RA, mild MR, mild AI. Asc Ao 36mm.   Hyperlipidemia    Low back pain    Peripheral vascular disease (HCC)    a. 04/2018 R SFA/popliteal stenting; b. 05/2018 L SFA/popliteal stenting.    Past Surgical History:  Procedure Laterality Date   COLONOSCOPY     COLONOSCOPY WITH PROPOFOL  N/A 07/23/2015   Procedure: COLONOSCOPY WITH PROPOFOL ;  Surgeon: Reyes LELON Cota, MD;  Location: [facility] ENDOSCOPY;  Service: Endoscopy;  Laterality: N/A;   COLONOSCOPY WITH PROPOFOL  N/A 04/20/2022   Procedure: COLONOSCOPY WITH  PROPOFOL ;  Surgeon: Therisa Bi, MD;  Location: [facility] ENDOSCOPY;  Service: Gastroenterology;  Laterality: N/A;   ESOPHAGOGASTRODUODENOSCOPY (EGD) WITH PROPOFOL  N/A 08/20/2015   Procedure: ESOPHAGOGASTRODUODENOSCOPY (EGD) WITH PROPOFOL ;  Surgeon: Reyes LELON Cota, MD;  Location: [facility] ENDOSCOPY;  Service: Endoscopy;  Laterality: N/A;   LOWER EXTREMITY ANGIOGRAPHY Left 05/19/2018   Procedure: LOWER EXTREMITY ANGIOGRAPHY;  Surgeon: Marea Selinda RAMAN, MD;  Location: [facility] INVASIVE CV LAB;  Service: Cardiovascular;  Laterality: Left;   LOWER EXTREMITY ANGIOGRAPHY Right 06/15/2018   Procedure: LOWER EXTREMITY ANGIOGRAPHY;  Surgeon: Marea Selinda RAMAN, MD;  Location: [facility] INVASIVE CV [facility];  Service: Cardiovascular;  Laterality: Right;   lymp node removal Right 2011   arm   left arm 2015   LYMPH NODE BIOPSY  08/2014     Family History  Problem Relation Age of Onset   Alcohol abuse Mother    Cirrhosis Mother    Heart attack Father    Kidney cancer Neg Hx        lung cancer   Bladder Cancer Neg Hx    Prostate cancer Neg Hx      Social History   Tobacco Use   Smoking status: Every Day    Types: Cigars   Smokeless tobacco: Never   Tobacco comments:    .5 ppd last year, total of 48.5 pack year  Smokes 1 cigar (black & milds) per day  Vaping Use   Vaping status: Never Used  Substance Use Topics   Alcohol use: No   Drug use: No     Allergies  Allergen Reactions   Penicillins     Other reaction(s): Rash, Hives ()   Latex Rash    Current Outpatient Medications  Medication Sig Dispense Refill   aspirin  EC 81 MG tablet Take 81 mg by mouth daily. Swallow whole. 90 tablet 3   zanubrutinib  (BRUKINSA ) 80 MG capsule Take 2 capsules (160 mg total) by mouth 2 (two) times daily. 120 capsule 3   guaiFENesin  (MUCINEX ) 600 MG 12 hr tablet Take 2 tablets (1,200 mg total) by mouth 2 (two) times daily. (Patient not taking: Reported on 06/30/2024) 28 tablet 3   pantoprazole  (PROTONIX ) 40 MG tablet Take 1 tablet  (40 mg total) by mouth daily. Prior to breakfast, 30 min to 1 hr (Patient not taking: Reported on 06/30/2024) 90 tablet 3   rosuvastatin  (CRESTOR ) 20 MG tablet Take 1 tablet (20 mg total) by mouth daily. (Patient not taking: Reported on 06/30/2024) 90 tablet 3   tamsulosin  (FLOMAX ) 0.4 MG CAPS capsule Take 2 capsules (0.8 mg total) by mouth daily. (Patient not taking: Reported on 06/30/2024) 180 capsule 3   traZODone  (DESYREL ) 50 MG tablet Take 0.5-1 tablets (25-50 mg total) by mouth at bedtime. (Patient not taking: Reported on 06/30/2024) 90 tablet 3   No current facility-administered medications for this visit.      REVIEW OF SYSTEMS (Negative unless checked)   Constitutional: [] Weight loss  [] Fever  [] Chills Cardiac: [] Chest pain   [] Chest pressure   [] Palpitations   [] Shortness of breath when laying flat   [] Shortness of breath at rest   [] Shortness of breath with exertion. Vascular:  [x] Pain in legs with walking   [] Pain in legs at rest   [] Pain in legs when laying flat   [x] Claudication   [] Pain in feet when walking  [] Pain in feet at rest  [] Pain in feet when laying flat   [] History of DVT   [] Phlebitis   [] Swelling in legs   [] Varicose veins   [] Non-healing ulcers Pulmonary:   [] Uses home oxygen   [] Productive cough   [] Hemoptysis   [] Wheeze  [] COPD   [] Asthma Neurologic:  [] Dizziness  [] Blackouts   [] Seizures   [] History of stroke   [] History of TIA  [] Aphasia   [] Temporary blindness   [] Dysphagia   [] Weakness or numbness in arms   [x] Weakness or numbness in legs Musculoskeletal:  [] Arthritis   [] Joint swelling   [] Joint pain   [] Low back pain Hematologic:  [x] Easy bruising  [] Easy bleeding   [] Hypercoagulable state   [x] Anemic  [] Hepatitis Gastrointestinal:  [] Blood in stool   [] Vomiting blood  [] Gastroesophageal reflux/heartburn   [] Abdominal pain Genitourinary:  [] Chronic kidney disease   [] Difficult urination  [] Frequent urination  [] Burning with urination   [] Hematuria Skin:  [] Rashes    [] Ulcers   [] Wounds Psychological:  [] History of anxiety   []  History of major depression.    Physical Exam BP 109/63   Pulse (!) 50   Resp 18   Wt 140 lb (63.5 kg)   BMI 21.29 kg/m  Gen:  WD/WN, NAD. Appears younger than stated age. Head: [place]/AT, No temporalis wasting.  Ear/Nose/Throat: Hearing grossly intact, nares w/o erythema or drainage, oropharynx w/o Erythema/Exudate Eyes: Conjunctiva clear, sclera non-icteric  Neck: trachea midline.  No JVD.  Pulmonary:  Good air movement, respirations  not labored, no use of accessory muscles  Cardiac: bradycardic Vascular:  Vessel Right Left  Radial Palpable Palpable                          PT 1+ 1+  DP 1+ 1+   Gastrointestinal:. No masses, surgical incisions, or scars. Musculoskeletal: M/S 5/5 throughout.  Extremities without ischemic changes.  No deformity or atrophy. No edema. Neurologic: Sensation grossly intact in extremities.  Symmetrical.  Speech is fluent. Motor exam as listed above. Psychiatric: Judgment intact, Mood & affect appropriate for pt's clinical situation. Dermatologic: No rashes or ulcers noted.  No cellulitis or open wounds.    Radiology No results found.  Labs Recent Results (from the past 2160 hours)  Lactate dehydrogenase     Status: None   Collection Time: 04/24/24 10:10 AM  Result Value Ref Range   LDH 118 98 - 192 U/L    Comment: Performed at [facility], [address] [place], [place] [address]  CBC with Differential (Cancer Center Only)     Status: Abnormal   Collection Time: 04/24/24 10:10 AM  Result Value Ref Range   WBC Count 8.1 4.0 - 10.5 K/uL   RBC 5.00 4.22 - 5.81 MIL/uL   Hemoglobin 12.2 (L) 13.0 - 17.0 g/dL   HCT 60.9 60.9 - 47.9 %   MCV 78.0 (L) 80.0 - 100.0 fL   MCH 24.4 (L) 26.0 - 34.0 pg   MCHC 31.3 30.0 - 36.0 g/dL   RDW 82.4 (H) 88.4 - 84.4 %   Platelet Count 258 150 - 400 K/uL   nRBC 0.0 0.0 - 0.2 %   Neutrophils Relative % 40 %   Neutro Abs 3.2 1.7 - 7.7  K/uL   Lymphocytes Relative 44 %   Lymphs Abs 3.6 0.7 - 4.0 K/uL   Monocytes Relative 10 %   Monocytes Absolute 0.8 0.1 - 1.0 K/uL   Eosinophils Relative 3 %   Eosinophils Absolute 0.2 0.0 - 0.5 K/uL   Basophils Relative 1 %   Basophils Absolute 0.1 0.0 - 0.1 K/uL   Immature Granulocytes 2 %   Abs Immature Granulocytes 0.13 (H) 0.00 - 0.07 K/uL    Comment: Performed at [facility], [address] [place], [place] [address]  CMP (Cancer Center only)     Status: Abnormal   Collection Time: 04/24/24 10:10 AM  Result Value Ref Range   Sodium 135 135 - 145 mmol/L   Potassium 4.3 3.5 - 5.1 mmol/L   Chloride 103 98 - 111 mmol/L   CO2 24 22 - 32 mmol/L   Glucose, Bld 140 (H) 70 - 99 mg/dL    Comment: Glucose reference range applies only to samples taken after fasting for at least 8 hours.   BUN 11 8 - 23 mg/dL   Creatinine 8.88 9.38 - 1.24 mg/dL   Calcium  8.6 (L) 8.9 - 10.3 mg/dL   Total Protein 6.6 6.5 - 8.1 g/dL   Albumin 4.0 3.5 - 5.0 g/dL   AST 21 15 - 41 U/L   ALT 16 0 - 44 U/L   Alkaline Phosphatase 48 38 - 126 U/L   Total Bilirubin 0.8 0.0 - 1.2 mg/dL   GFR, Estimated >39 >39 mL/min    Comment: (NOTE) Calculated using the CKD-EPI Creatinine Equation (2021)    Anion gap 8 5 - 15    Comment: Performed at [facility], [address] [place],  [place] [address]    Assessment/Plan:  Hyperlipidemia lipid control important in reducing the progression of atherosclerotic disease. Continue statin therapy   Atherosclerosis of native arteries of extremity with intermittent claudication (HCC) Noninvasive studies were performed today showing a right ABI of 0.68 and a left ABI 0.89.  Duplex was performed showing SFA occlusions bilaterally.   We had a Long discussion with the patient today.  Patient has no immediate limb threatening symptoms and intervention would only be recommended if his symptoms are disabling and keeping him from doing his normal daily activities.   He is fairly adamant that his symptoms are keeping him from his normal activities and he would definitely desire intervention to try to improve his symptoms at this time.  His right leg is the more severely affected of the 2 legs and has the more limited perfusion by his noninvasive studies, so we would plan on performing this first.  Pending his results from the right leg, we could then proceed with left leg intervention shortly thereafter.  I discussed the risks and benefits of the procedure.  Patient voices his understanding and desires to proceed.      Selinda Gu 06/30/2024, 9:13 AM   This note was created with Dragon medical transcription system.  Any errors from dictation are unintentional.

## 2024-07-03 LAB — VAS US ABI WITH/WO TBI
Left ABI: 0.89
Right ABI: 0.68

## 2024-07-07 ENCOUNTER — Telehealth (INDEPENDENT_AMBULATORY_CARE_PROVIDER_SITE_OTHER): Payer: Self-pay

## 2024-07-07 NOTE — Telephone Encounter (Signed)
 Patient called and left a message to cancel his procedure scheduled with Dr. Marea on 07/10/24 at the [facility] for a RLE angio. Patient is scheduled for one on 07/17/24 as well. I called back to clarify if it was both procedures as the patient stated he didn't have $400 to have his procedure. Patient's procedure on 07/10/24 has been canceled.

## 2024-07-10 ENCOUNTER — Encounter: Admission: RE | Payer: Self-pay | Source: Home / Self Care

## 2024-07-10 ENCOUNTER — Ambulatory Visit: Admission: RE | Admit: 2024-07-10 | Source: Home / Self Care | Admitting: Vascular Surgery

## 2024-07-10 DIAGNOSIS — L97909 Non-pressure chronic ulcer of unspecified part of unspecified lower leg with unspecified severity: Secondary | ICD-10-CM

## 2024-07-10 SURGERY — LOWER EXTREMITY INTERVENTION
Anesthesia: Moderate Sedation | Site: Leg Lower | Laterality: Right

## 2024-07-13 ENCOUNTER — Other Ambulatory Visit: Payer: Self-pay

## 2024-07-17 ENCOUNTER — Encounter: Admission: RE | Payer: Self-pay | Source: Home / Self Care

## 2024-07-17 ENCOUNTER — Other Ambulatory Visit (HOSPITAL_COMMUNITY): Payer: Self-pay

## 2024-07-17 ENCOUNTER — Ambulatory Visit: Admission: RE | Admit: 2024-07-17 | Source: Home / Self Care | Admitting: Vascular Surgery

## 2024-07-17 DIAGNOSIS — I70219 Atherosclerosis of native arteries of extremities with intermittent claudication, unspecified extremity: Secondary | ICD-10-CM

## 2024-07-17 SURGERY — LOWER EXTREMITY INTERVENTION
Anesthesia: Moderate Sedation | Site: Leg Lower | Laterality: Left

## 2024-07-25 ENCOUNTER — Inpatient Hospital Stay: Attending: Oncology

## 2024-07-25 ENCOUNTER — Other Ambulatory Visit: Payer: Self-pay

## 2024-07-25 ENCOUNTER — Inpatient Hospital Stay (HOSPITAL_BASED_OUTPATIENT_CLINIC_OR_DEPARTMENT_OTHER): Admitting: Oncology

## 2024-07-25 ENCOUNTER — Encounter: Payer: Self-pay | Admitting: Oncology

## 2024-07-25 ENCOUNTER — Other Ambulatory Visit (HOSPITAL_COMMUNITY): Payer: Self-pay

## 2024-07-25 VITALS — BP 128/69 | HR 47 | Temp 96.0°F | Resp 18 | Wt 141.3 lb

## 2024-07-25 DIAGNOSIS — Z79899 Other long term (current) drug therapy: Secondary | ICD-10-CM | POA: Insufficient documentation

## 2024-07-25 DIAGNOSIS — D563 Thalassemia minor: Secondary | ICD-10-CM | POA: Insufficient documentation

## 2024-07-25 DIAGNOSIS — R001 Bradycardia, unspecified: Secondary | ICD-10-CM

## 2024-07-25 DIAGNOSIS — C9110 Chronic lymphocytic leukemia of B-cell type not having achieved remission: Secondary | ICD-10-CM

## 2024-07-25 DIAGNOSIS — Z801 Family history of malignant neoplasm of trachea, bronchus and lung: Secondary | ICD-10-CM | POA: Insufficient documentation

## 2024-07-25 DIAGNOSIS — F17290 Nicotine dependence, other tobacco product, uncomplicated: Secondary | ICD-10-CM | POA: Diagnosis not present

## 2024-07-25 DIAGNOSIS — Z9221 Personal history of antineoplastic chemotherapy: Secondary | ICD-10-CM | POA: Insufficient documentation

## 2024-07-25 DIAGNOSIS — R7989 Other specified abnormal findings of blood chemistry: Secondary | ICD-10-CM | POA: Insufficient documentation

## 2024-07-25 DIAGNOSIS — D509 Iron deficiency anemia, unspecified: Secondary | ICD-10-CM | POA: Diagnosis not present

## 2024-07-25 DIAGNOSIS — Z5111 Encounter for antineoplastic chemotherapy: Secondary | ICD-10-CM

## 2024-07-25 LAB — CBC WITH DIFFERENTIAL (CANCER CENTER ONLY)
Abs Immature Granulocytes: 0.28 K/uL — ABNORMAL HIGH (ref 0.00–0.07)
Basophils Absolute: 0.1 K/uL (ref 0.0–0.1)
Basophils Relative: 1 %
Eosinophils Absolute: 0.3 K/uL (ref 0.0–0.5)
Eosinophils Relative: 5 %
HCT: 37.5 % — ABNORMAL LOW (ref 39.0–52.0)
Hemoglobin: 12.1 g/dL — ABNORMAL LOW (ref 13.0–17.0)
Immature Granulocytes: 4 %
Lymphocytes Relative: 42 %
Lymphs Abs: 2.8 K/uL (ref 0.7–4.0)
MCH: 25 pg — ABNORMAL LOW (ref 26.0–34.0)
MCHC: 32.3 g/dL (ref 30.0–36.0)
MCV: 77.5 fL — ABNORMAL LOW (ref 80.0–100.0)
Monocytes Absolute: 0.8 K/uL (ref 0.1–1.0)
Monocytes Relative: 13 %
Neutro Abs: 2.3 K/uL (ref 1.7–7.7)
Neutrophils Relative %: 35 %
Platelet Count: 198 K/uL (ref 150–400)
RBC: 4.84 MIL/uL (ref 4.22–5.81)
RDW: 18.6 % — ABNORMAL HIGH (ref 11.5–15.5)
Smear Review: NORMAL
WBC Count: 6.6 K/uL (ref 4.0–10.5)
nRBC: 0 % (ref 0.0–0.2)

## 2024-07-25 LAB — CMP (CANCER CENTER ONLY)
ALT: 13 U/L (ref 0–44)
AST: 19 U/L (ref 15–41)
Albumin: 3.8 g/dL (ref 3.5–5.0)
Alkaline Phosphatase: 40 U/L (ref 38–126)
Anion gap: 6 (ref 5–15)
BUN: 12 mg/dL (ref 8–23)
CO2: 25 mmol/L (ref 22–32)
Calcium: 8.9 mg/dL (ref 8.9–10.3)
Chloride: 108 mmol/L (ref 98–111)
Creatinine: 1.29 mg/dL — ABNORMAL HIGH (ref 0.61–1.24)
GFR, Estimated: 59 mL/min — ABNORMAL LOW (ref >60)
Glucose, Bld: 95 mg/dL (ref 70–99)
Potassium: 4.6 mmol/L (ref 3.5–5.1)
Sodium: 139 mmol/L (ref 135–145)
Total Bilirubin: 0.7 mg/dL (ref 0.0–1.2)
Total Protein: 5.9 g/dL — ABNORMAL LOW (ref 6.5–8.1)

## 2024-07-25 LAB — LACTATE DEHYDROGENASE: LDH: 118 U/L (ref 98–192)

## 2024-07-25 MED ORDER — BRUKINSA 80 MG PO CAPS
160.0000 mg | ORAL_CAPSULE | Freq: Two times a day (BID) | ORAL | 3 refills | Status: DC
  Filled 2024-07-25 (×2): qty 120, 30d supply, fill #0
  Filled 2024-08-22 – 2024-09-04 (×2): qty 120, 30d supply, fill #1
  Filled 2024-10-02: qty 120, 30d supply, fill #2
  Filled 2024-10-26: qty 120, 30d supply, fill #3

## 2024-07-25 NOTE — Assessment & Plan Note (Signed)
 Patient reports intermittent lightheadedness. I discussed case with cardiology provider and he will have a follow-up appointment with cardiology office for further evaluation.

## 2024-07-25 NOTE — Progress Notes (Signed)
 Specialty Pharmacy Refill Coordination Note  [name] is a 73 y.o. male contacted today regarding refills of specialty medication(s) Zanubrutinib  (Brukinsa )   Patient requested Delivery   Delivery date: 07/26/24   Verified address: [address]  [place]   Medication will be filled on 07/25/24.

## 2024-07-25 NOTE — Progress Notes (Signed)
 Hematology/Oncology Progress note [phone] [phone]   Chief Complaint: [name] is a 73 y.o.  male presents to follow up for chronic lymphocytic lymphoma.   ASSESSMENT & PLAN:   Cancer Staging  Chronic lymphocytic leukemia (CLL), B-cell (HCC) Staging form: Chronic Lymphocytic Leukemia / Small Lymphocytic Lymphoma, AJCC 8th Edition - Clinical stage from 07/23/2017: Modified Rai Stage III (Modified Rai risk: High, Lymphocytosis: Present, Adenopathy: Present, Organomegaly: Absent, Anemia: Present, Thrombocytopenia: Absent) - Signed by [name], MD on 02/19/2022   Chronic lymphocytic leukemia (CLL), B-cell (HCC) Recurrent CLL, trisomy 12 and TP53 mutated, IgVH Somatic Hypermutation unmutated He did not Acalabrutinib  due to diarrhea. He prefers not to try reduced dose, S/p Rituximab  weekly x 4, CT showed progression--> Abdominal mass biopsy confirmed CLL, no evidence of transformation--> Zanubrutinib  160 mg twice daily. --? 06/2023 CT showed partial response.  Labs reviewed and discussed with patient. Wbc has normalized Overall he tolerates Zanubrutinib  160 mg twice daily.  Recommend patient to continue current regimen.   Beta thalassemia minor Beta thalassemia minor, with microcytic anemia. Stable counts.   Encounter for antineoplastic chemotherapy CLL treatment as listed above  Elevated serum creatinine Encourage oral hydration and avoid nephrotoxins.    Bradycardia Patient reports intermittent lightheadedness. I discussed case with cardiology provider and he will have a follow-up appointment with cardiology office for further evaluation.   Follow-up  3 months lab MD    All questions were answered. The patient knows to call the clinic with any problems, questions or concerns.  Call Babara, MD, PhD [facility] Hematology Oncology 07/25/2024     HPI:   Oncology History  Chronic lymphocytic leukemia (CLL), B-cell (HCC)  09/13/2014 Initial Diagnosis    CLL - hemolytic anemia in 01/2014.  He was treated with prednisone . 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  -Oct 2020 second opinion at [facility] and was evaluated by heme-onc Dr.Ellis.  Patient was recommended for watchful waiting   10/02/2014 - 10/23/2014 Chemotherapy   -10/02/2014- 10/23/2014  4 weekly cycles of Rituxan   -07/23/2017 Decision was made to start Ibrutinib  as patient is very symptomatic (fatigue and weight loss, lack of appetite). Patient did not show up at the chemo education class   05/08/2022 Imaging   CT CHEST, ABDOMEN, AND PELVIS WITH CONTRAST 1 unchanged enlarged bilateral axillary lymph nodes and prominent pretracheal mediastinal lymph nodes. 2. However, when compared to most recent prior CT examination of the abdomen and pelvis dated 06/24/2016, there are numerous, extremely bulky lymph nodes throughout the abdomen and pelvis, which are greatly increased in size. Newly enlarged left inguinal lymph nodes, in keeping with reported palpable lymphadenopathy. 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 3. Mild right hydronephrosis; the proximal ureter appears compressed or obstructed by overlying lymphadenopathy. 4. Emphysema.5. Coronary artery disease.   05/30/2022 Imaging   PET scan showed  1. Again seen is massive nodal conglomeration within the abdomen with mildly diffuse increased tracer uptake with SUV max of 3.15.There also enlarged bilateral pelvic and left inguinal lymph nodes which exhibit mild increased tracer uptake compatible with residual/recurrent lymphoma. Imaging findings are compatible with residual/recurrent metabolically active low-grade lymphoma. 2. No significant tracer uptake associated with the borderline enlarged bilateral axillary lymph nodes.3. Unchanged appearance of small  nonspecific subpleural nodules within the periphery of the upper lobes measuring up to 4 mm. These are too  small to characterize by PET-CT.4. Aortic Atherosclerosis (ICD10-I70.0) and Emphysema (ICD10-J43.9). 5. Coronary artery calcifications. 6. Normal size spleen with background activity similar to the liver   06/22/2022 -  Chemotherapy   acalabrutinib  100 mg twice daily.   07/27/2022 - 08/20/2022 Chemotherapy   Patient is on Treatment Plan : ITP Rituximab  q7d x 4 cycles     11/09/2022 Imaging   CT abdomen pelvis w contrast  1. Progressive bulky retroperitoneal and mesenteric lymphadenopathy consistent with progressive lymphoma. 2. No evidence of bowel obstruction, perforation or abscess. 3. Stable small amount of free pelvic fluid. 4. Aortic Atherosclerosis (ICD10-I70.0). Chronically occluded right femoral arterial stent    11/09/2022 [facility] Admission   Patient presented to emergency room for evaluation of shortness of breath.  He has noticed progressive worsening of abdominal swelling and distention.  Associated with shortness of breath.  Patient was started on diuretics.     11/24/2022 Procedure   CT guided biopsy of abdomen mass  Pathology showed CLL/SLL Core biopsy sections demonstrate lymphoid tissue with partially effaced follicular architecture.  The bulk of the tissue consists of sheets of small lymphocytes with dense chromatin and scant cytoplasm.  There is no significant increase in larger prolymphocytic type cells.  Relatively few small germinal centers are appreciated.  Concurrent flow cytometric studies demonstrate a CD5+, CD23 + monoclonal B-cell population comprising 87% of total lymphoid cells and 100% of B cells.  The phenotype is compatible with chronic lymphocytic leukemia/small lymphocytic lymphoma.  There are no features in the current sample to suggest progression to higher grade process. Clinical and radiographic correlation is recommended.    12/09/2022 -   Chemotherapy   Zanubrutinib  160 mg twice daily     INTERVAL HISTORY 73 y.o. male with history of CLL presents for follow-up. Patient has been on Zanubrutinib  160 mg twice daily, Patient reports intermittent lightheadedness feeling fatigued. Denies any nausea vomiting diarrhea, denies abdominal pain.    Past Medical History:  Diagnosis Date   Anemia 2008   Arthritis    CAD (coronary artery disease)    a. 05/2021 Cor CTA: LM nl, LAD 25-49%, D1 >70 (FFRct 0.73), LCXnl, RCA <25% throughout-->Med rx.   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 Babara   History of echocardiogram    a. 05/2022 Echo: EF 55-60%, no rwma, nl RV fxn, mildly dil RA, mild MR, mild AI. Asc Ao 36mm.   Hyperlipidemia    Low back pain    Peripheral vascular disease (HCC)    a. 04/2018 R SFA/popliteal stenting; b. 05/2018 L SFA/popliteal stenting.    Past Surgical History:  Procedure Laterality Date   COLONOSCOPY     COLONOSCOPY WITH PROPOFOL  N/A 07/23/2015   Procedure: COLONOSCOPY WITH PROPOFOL ;  Surgeon: Reyes LELON Cota, MD;  Location: [facility] ENDOSCOPY;  Service: Endoscopy;  Laterality: N/A;   COLONOSCOPY WITH PROPOFOL  N/A 04/20/2022   Procedure: COLONOSCOPY WITH PROPOFOL ;  Surgeon: Therisa Bi, MD;  Location: [facility] ENDOSCOPY;  Service: Gastroenterology;  Laterality: N/A;   ESOPHAGOGASTRODUODENOSCOPY (EGD) WITH PROPOFOL  N/A 08/20/2015   Procedure: ESOPHAGOGASTRODUODENOSCOPY (EGD) WITH PROPOFOL ;  Surgeon: Reyes LELON Cota, MD;  Location: [facility] ENDOSCOPY;  Service: Endoscopy;  Laterality: N/A;   LOWER EXTREMITY ANGIOGRAPHY Left 05/19/2018   Procedure: LOWER EXTREMITY ANGIOGRAPHY;  Surgeon: Marea Selinda RAMAN, MD;  Location: [facility] INVASIVE CV LAB;  Service: Cardiovascular;  Laterality: Left;   LOWER EXTREMITY ANGIOGRAPHY Right 06/15/2018   Procedure: LOWER EXTREMITY ANGIOGRAPHY;  Surgeon: Marea Selinda  S, MD;  Location: [facility] INVASIVE CV LAB;  Service: Cardiovascular;  Laterality: Right;   lymp  node removal Right 2011   arm   left arm 2015   LYMPH NODE BIOPSY  08/2014    Family History  Problem Relation Age of Onset   Alcohol abuse Mother    Cirrhosis Mother    Heart attack Father    Kidney cancer Neg Hx        lung cancer   Bladder Cancer Neg Hx    Prostate cancer Neg Hx     Social History:  reports that he has been smoking cigars. He has never used smokeless tobacco. He reports that he does not drink alcohol and does not use drugs.  Allergies:  Allergies  Allergen Reactions   Penicillins     Other reaction(s): Rash, Hives ()   Latex Rash   Review of Systems:  Review of Systems  Constitutional:  Negative for chills, fever, malaise/fatigue and weight loss.  HENT:  Negative for ear discharge, ear pain, nosebleeds and sore throat.   Eyes:  Negative for photophobia, pain and redness.  Respiratory:  Negative for cough, hemoptysis, sputum production, shortness of breath and wheezing.   Cardiovascular:  Negative for chest pain, palpitations and leg swelling.  Gastrointestinal:  Negative for abdominal pain, blood in stool, constipation, diarrhea, heartburn, nausea and vomiting.  Genitourinary:  Negative for dysuria and frequency.  Musculoskeletal:  Negative for myalgias and neck pain.  Skin:  Negative for rash.  Neurological:  Negative for dizziness, tingling, tremors, weakness and headaches.  Endo/Heme/Allergies:  Does not bruise/bleed easily.  Psychiatric/Behavioral:  Negative for depression, hallucinations and suicidal ideas.      Current Outpatient Medications on File Prior to Visit  Medication Sig Dispense Refill   aspirin  EC 81 MG tablet Take 81 mg by mouth daily. Swallow whole. 90 tablet 3   guaiFENesin  (MUCINEX ) 600 MG 12 hr tablet Take 2 tablets (1,200 mg total) by mouth 2 (two) times daily. (Patient not taking: Reported on 07/25/2024) 28 tablet 3   pantoprazole  (PROTONIX ) 40 MG tablet Take 1 tablet (40 mg total) by mouth daily. Prior to breakfast, 30 min to 1 hr  (Patient not taking: Reported on 07/25/2024) 90 tablet 3   rosuvastatin  (CRESTOR ) 20 MG tablet Take 1 tablet (20 mg total) by mouth daily. (Patient not taking: Reported on 07/25/2024) 90 tablet 3   tamsulosin  (FLOMAX ) 0.4 MG CAPS capsule Take 2 capsules (0.8 mg total) by mouth daily. (Patient not taking: Reported on 07/25/2024) 180 capsule 3   traZODone  (DESYREL ) 50 MG tablet Take 0.5-1 tablets (25-50 mg total) by mouth at bedtime. (Patient not taking: Reported on 07/25/2024) 90 tablet 3   No current facility-administered medications on file prior to visit.    Physical Exam: Blood pressure 128/69, pulse (!) 47, temperature (!) 96 F (35.6 C), temperature source Tympanic, resp. rate 18, weight 141 lb 4.8 oz (64.1 kg), SpO2 100%. Physical Exam Constitutional:      General: He is not in acute distress.    Appearance: He is not diaphoretic.  HENT:     Head: Normocephalic.  Eyes:     General: No scleral icterus. Cardiovascular:     Rate and Rhythm: Regular rhythm. Bradycardia present.  Pulmonary:     Effort: Pulmonary effort is normal. No respiratory distress.     Breath sounds: No wheezing.  Abdominal:     General: Bowel sounds are normal. There is no distension.     Palpations:  Abdomen is soft. There is no mass.  Genitourinary:    Comments:   Musculoskeletal:        General: Normal range of motion.     Cervical back: Normal range of motion.  Skin:    General: Skin is warm and dry.     Findings: No erythema.  Neurological:     Mental Status: He is alert and oriented to person, place, and time. Mental status is at baseline.     Cranial Nerves: No cranial nerve deficit.     Motor: No abnormal muscle tone.  Psychiatric:        Mood and Affect: Mood and affect normal.     LABORATORY RESULTS.    Latest Ref Rng & Units 07/25/2024    9:55 AM 04/24/2024   10:10 AM 01/26/2024   10:28 AM  CBC  WBC 4.0 - 10.5 K/uL 6.6  8.1  9.0   Hemoglobin 13.0 - 17.0 g/dL 87.8  87.7  87.2   Hematocrit 39.0  - 52.0 % 37.5  39.0  40.6   Platelets 150 - 400 K/uL 198  258  178       Latest Ref Rng & Units 07/25/2024    9:56 AM 04/24/2024   10:10 AM 02/02/2024   10:39 AM  CMP  Glucose 70 - 99 mg/dL 95  859  877   BUN 8 - 23 mg/dL 12  11  11    Creatinine 0.61 - 1.24 mg/dL 8.70  8.88  9.00   Sodium 135 - 145 mmol/L 139  135  138   Potassium 3.5 - 5.1 mmol/L 4.6  4.3  4.8   Chloride 98 - 111 mmol/L 108  103  106   CO2 22 - 32 mmol/L 25  24  25    Calcium  8.9 - 10.3 mg/dL 8.9  8.6  8.9   Total Protein 6.5 - 8.1 g/dL 5.9  6.6    Total Bilirubin 0.0 - 1.2 mg/dL 0.7  0.8    Alkaline Phos 38 - 126 U/L 40  48    AST 15 - 41 U/L 19  21    ALT 0 - 44 U/L 13  16       PATHOLOGY:  11/02/2016 Peripheral blood FISH studies revealed 20% of nuclei positive for loss of 1 ATM signal, 63% of nuclei positive for trisomy 12, and 32% of nuclei positive for loss of 1 TP53 signal. Results for CCND1/IGH and 13q were normal.  09/13/2014 Left axillary node biopsy on  confirmed B-cell small lymphocytic lymphoma (B-CLL/SLL)   IMAGE STUDIES VAS US  ABI WITH/WO TBI Result Date: 07/03/2024  LOWER EXTREMITY DOPPLER STUDY Patient Name:  [name]  Date of Exam:   06/30/2024 Medical Rec #: [id number]     Accession #:    [id number] Date of Birth: November 07, 1951     Patient Gender: M Patient Age:   41 years Exam Location:  [facility] Procedure:      VAS US  ABI WITH/WO TBI Referring Phys: --------------------------------------------------------------------------------   Vascular Interventions: June 2019 = left PTA of SFA to Pop and stent                         May 2019 = Right PTA of SFA to pop with stent in SFA and                         stent in Pop. Comparison  Study: 06/2018 Performing Technologist: Jerel Croak RVT  Examination Guidelines: A complete evaluation includes at minimum, Doppler waveform signals and systolic blood pressure reading at the level of bilateral brachial, anterior tibial, and posterior tibial  arteries, when vessel segments are accessible. Bilateral testing is considered an integral part of a complete examination. Photoelectric Plethysmograph (PPG) waveforms and toe systolic pressure readings are included as required and additional duplex testing as needed. Limited examinations for reoccurring indications may be performed as noted.  ABI Findings: +---------+------------------+-----+----------+--------+ Right    Rt Pressure (mmHg)IndexWaveform  Comment  +---------+------------------+-----+----------+--------+ Brachial 111                                       +---------+------------------+-----+----------+--------+ PTA      77                0.68 monophasic         +---------+------------------+-----+----------+--------+ DP       61                0.54 monophasic         +---------+------------------+-----+----------+--------+ Great Toe34                0.30 Abnormal           +---------+------------------+-----+----------+--------+ +---------+------------------+-----+----------+-------+ Left     Lt Pressure (mmHg)IndexWaveform  Comment +---------+------------------+-----+----------+-------+ Brachial 114                                      +---------+------------------+-----+----------+-------+ PTA      101               0.89 biphasic          +---------+------------------+-----+----------+-------+ DP       92                0.81 monophasic        +---------+------------------+-----+----------+-------+ Great Toe49                0.43 Abnormal          +---------+------------------+-----+----------+-------+ +-------+-----------+-----------+------------+------------+ ABI/TBIToday's ABIToday's TBIPrevious ABIPrevious TBI +-------+-----------+-----------+------------+------------+ Right  .68        .30        1.15        .75          +-------+-----------+-----------+------------+------------+ Left   .89        .43        1.03         .31          +-------+-----------+-----------+------------+------------+  Bilateral ABIs and TBIs appear decreased compared to prior study on 06/2018.  Summary: Right: Resting right ankle-brachial index indicates moderate right lower extremity arterial disease. The right toe-brachial index is abnormal. Duplex confirms SFA and stents occluded. Left: Resting left ankle-brachial index indicates mild left lower extremity arterial disease. The left toe-brachial index is abnormal. Duplex confirms SFA and stents occluded. *See table(s) above for measurements and observations.  Electronically signed by Selinda Gu MD on 07/03/2024 at 10:45:51 AM.    Final    VAS US  LOWER EXTREMITY ARTERIAL DUPLEX Result Date: 07/03/2024 LOWER EXTREMITY ARTERIAL DUPLEX STUDY Patient Name:  [name]  Date of Exam:   06/30/2024 Medical Rec #: [id number]     Accession #:    [id number] Date of Birth: [birth year]  Patient Gender: M Patient Age:   58 years Exam Location:  [facility] Procedure:      VAS US  LOWER EXTREMITY ARTERIAL DUPLEX Referring Phys: ORVIN DARING --------------------------------------------------------------------------------  Indications: Claudication, rest pain, and peripheral artery disease.  Vascular Interventions: June 2019 = left PTA of SFA to Pop and stent                         May 2019 = Right PTA of SFA to pop with stent in SFA and                         stent in Pop. Current ABI:            r = .68 ; l = .89 Performing Technologist: Jerel Croak RVT  Examination Guidelines: A complete evaluation includes B-mode imaging, spectral Doppler, color Doppler, and power Doppler as needed of all accessible portions of each vessel. Bilateral testing is considered an integral part of a complete examination. Limited examinations for reoccurring indications may be performed as noted.  +----------+--------+-----+--------+----------+---------------+ RIGHT     PSV cm/sRatioStenosisWaveform  Comments         +----------+--------+-----+--------+----------+---------------+ CFA Mid   65                   biphasic                  +----------+--------+-----+--------+----------+---------------+ DFA       72                   triphasic                 +----------+--------+-----+--------+----------+---------------+ SFA Prox               occluded          stent           +----------+--------+-----+--------+----------+---------------+ SFA Mid                occluded          stent           +----------+--------+-----+--------+----------+---------------+ SFA Distal31                   monophasiccollateral feed +----------+--------+-----+--------+----------+---------------+ POP Distal22                   monophasic                +----------+--------+-----+--------+----------+---------------+ ATA Distal18                   monophasic                +----------+--------+-----+--------+----------+---------------+ PTA Distal17                   monophasic                +----------+--------+-----+--------+----------+---------------+  +----------+--------+-----+--------+----------+---------------------+ LEFT      PSV cm/sRatioStenosisWaveform  Comments              +----------+--------+-----+--------+----------+---------------------+ CFA Mid   75                   biphasic                        +----------+--------+-----+--------+----------+---------------------+ DFA       62  biphasic                        +----------+--------+-----+--------+----------+---------------------+ SFA Prox               occluded          stent                 +----------+--------+-----+--------+----------+---------------------+ SFA Mid                occluded          stent                 +----------+--------+-----+--------+----------+---------------------+ SFA Distal44                   monophasicstent collateral feed  +----------+--------+-----+--------+----------+---------------------+ POP Distal79                   monophasic                      +----------+--------+-----+--------+----------+---------------------+ ATA Distal52                   monophasic                      +----------+--------+-----+--------+----------+---------------------+ PTA Distal35                   monophasic                      +----------+--------+-----+--------+----------+---------------------+  Summary: Right: Occluded SFA and stents with collateral feed to distal SFA. Left: Occluded SFA and stents with collateral feed to distal SFA.  See table(s) above for measurements and observations. Electronically signed by Selinda Gu MD on 07/03/2024 at 10:45:28 AM.    Final

## 2024-07-25 NOTE — Assessment & Plan Note (Signed)
 Recurrent CLL, trisomy 12 and TP53 mutated, IgVH Somatic Hypermutation unmutated He did not Acalabrutinib  due to diarrhea. He prefers not to try reduced dose, S/p Rituximab  weekly x 4, CT showed progression--> Abdominal mass biopsy confirmed CLL, no evidence of transformation--> Zanubrutinib  160 mg twice daily. --? 06/2023 CT showed partial response.  Labs reviewed and discussed with patient. Wbc has normalized Overall he tolerates Zanubrutinib  160 mg twice daily.  Recommend patient to continue current regimen.

## 2024-07-25 NOTE — Assessment & Plan Note (Signed)
CLL treatment as listed above 

## 2024-07-25 NOTE — Assessment & Plan Note (Signed)
 Encourage oral hydration and avoid nephrotoxins.

## 2024-07-25 NOTE — Assessment & Plan Note (Signed)
 Beta thalassemia minor, with microcytic anemia. Stable counts.

## 2024-08-01 ENCOUNTER — Ambulatory Visit

## 2024-08-01 ENCOUNTER — Ambulatory Visit: Attending: Nurse Practitioner | Admitting: Nurse Practitioner

## 2024-08-01 ENCOUNTER — Encounter: Payer: Self-pay | Admitting: Nurse Practitioner

## 2024-08-01 VITALS — BP 106/64 | HR 47 | Ht 68.0 in | Wt 139.5 lb

## 2024-08-01 DIAGNOSIS — I70219 Atherosclerosis of native arteries of extremities with intermittent claudication, unspecified extremity: Secondary | ICD-10-CM | POA: Diagnosis not present

## 2024-08-01 DIAGNOSIS — E785 Hyperlipidemia, unspecified: Secondary | ICD-10-CM

## 2024-08-01 DIAGNOSIS — Z720 Tobacco use: Secondary | ICD-10-CM | POA: Diagnosis not present

## 2024-08-01 DIAGNOSIS — I2510 Atherosclerotic heart disease of native coronary artery without angina pectoris: Secondary | ICD-10-CM | POA: Diagnosis not present

## 2024-08-01 DIAGNOSIS — R001 Bradycardia, unspecified: Secondary | ICD-10-CM | POA: Diagnosis not present

## 2024-08-01 DIAGNOSIS — R55 Syncope and collapse: Secondary | ICD-10-CM

## 2024-08-01 NOTE — Progress Notes (Signed)
 Office Visit    Patient Name: [name] Date of Encounter: 08/01/2024  Primary Care Provider:  Dineen Rollene MATSU, FNP Primary Cardiologist:  Evalene Lunger, MD    Chief Complaint    73 y.o. male with a history of coronary artery disease, hyperlipidemia, peripheral arterial disease status post lower extremity interventions, CLL, anemia, and tobacco abuse, who presents for follow-up related to bradycardia.  Past Medical History   Subjective   Past Medical History:  Diagnosis Date   Anemia 2008   Arthritis    CAD (coronary artery disease)    a. 05/2021 Cor CTA: LM nl, LAD 25-49%, D1 >70 (FFRct 0.73), LCXnl, RCA <25% throughout-->Med rx.   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 Babara   History of echocardiogram    a. 05/2022 Echo: EF 55-60%, no rwma, nl RV fxn, mildly dil RA, mild MR, mild AI. Asc Ao 36mm.   Hyperlipidemia    Low back pain    Peripheral vascular disease (HCC)    a. 04/2018 R SFA/popliteal stenting; b. 05/2018 L SFA/popliteal stenting.   Past Surgical History:  Procedure Laterality Date   COLONOSCOPY     COLONOSCOPY WITH PROPOFOL  N/A 07/23/2015   Procedure: COLONOSCOPY WITH PROPOFOL ;  Surgeon: Reyes LELON Cota, MD;  Location: [facility] ENDOSCOPY;  Service: Endoscopy;  Laterality: N/A;   COLONOSCOPY WITH PROPOFOL  N/A 04/20/2022   Procedure: COLONOSCOPY WITH PROPOFOL ;  Surgeon: Therisa Bi, MD;  Location: [facility] ENDOSCOPY;  Service: Gastroenterology;  Laterality: N/A;   ESOPHAGOGASTRODUODENOSCOPY (EGD) WITH PROPOFOL  N/A 08/20/2015   Procedure: ESOPHAGOGASTRODUODENOSCOPY (EGD) WITH PROPOFOL ;  Surgeon: Reyes LELON Cota, MD;  Location: [facility] ENDOSCOPY;  Service: Endoscopy;  Laterality: N/A;   LOWER EXTREMITY ANGIOGRAPHY Left 05/19/2018   Procedure: LOWER EXTREMITY ANGIOGRAPHY;  Surgeon: Marea Selinda RAMAN, MD;  Location: [facility] INVASIVE CV LAB;  Service: Cardiovascular;  Laterality: Left;   LOWER EXTREMITY ANGIOGRAPHY Right 06/15/2018    Procedure: LOWER EXTREMITY ANGIOGRAPHY;  Surgeon: Marea Selinda RAMAN, MD;  Location: [facility] INVASIVE CV LAB;  Service: Cardiovascular;  Laterality: Right;   lymp node removal Right 2011   arm   left arm 2015   LYMPH NODE BIOPSY  08/2014    Allergies  Allergies  Allergen Reactions   Penicillins     Other reaction(s): Rash, Hives ()   Latex Rash       History of Present Illness      73 y.o. y/o male with a history of CAD, hyperlipidemia, sinus bradycardia, peripheral arterial disease status post lower extremity interventions, CLL, anemia, and tobacco abuse.  He previously established care in May 2022 in the setting of bradycardia, fatigue, chest tightness, and dyspnea.  Underwent coronary CT angiogram in June 2022 revealing severe first diagonal disease and otherwise mild, nonobstructive disease.  Decision was made to pursue medical therapy given history of anemia and improvement in chest pain symptoms.  An echocardiogram in June 2023 showed an EF of 55-60% without regional wall motion abnormalities, mild MR and AI.   [name] was seen in cardiology clinic in May 2025, at which time he reported progressive bilateral lower extremity claudication.  He subsequently followed up with vascular surgery and ABIs were performed showing an ABI of 0.68 on the right and 0.89 on the left.  Duplex showed bilateral SFA occlusions.  Arrangements were made for peripheral angiography however, patient canceled due to for his finances.   [name] was recently seen at the cancer center for routine follow-up  with and reported intermittent lightheadedness.  Heart rate was 47 and he was referred back here for follow-up.  Today, [name] is asymptomatic.  He says that he's often a little sluggish in the AM but by mid-morning, he feels well.  He occasionally experiences lightheadedness, though notes that this is more often with position changes.  He denies chest pain, dyspnea, palpitations, PND, orthopnea, syncope, edema, or early  satiety.  He has ongoing bilateral lower extremity claudication which he notes mostly when mowing his yard.  He has to take relatively frequent breaks. Objective   Home Medications    Current Outpatient Medications  Medication Sig Dispense Refill   aspirin  EC 81 MG tablet Take 81 mg by mouth daily. Swallow whole. 90 tablet 3   zanubrutinib  (BRUKINSA ) 80 MG capsule Take 2 capsules (160 mg total) by mouth 2 (two) times daily. 120 capsule 3   guaiFENesin  (MUCINEX ) 600 MG 12 hr tablet Take 2 tablets (1,200 mg total) by mouth 2 (two) times daily. (Patient not taking: Reported on 08/01/2024) 28 tablet 3   pantoprazole  (PROTONIX ) 40 MG tablet Take 1 tablet (40 mg total) by mouth daily. Prior to breakfast, 30 min to 1 hr (Patient not taking: Reported on 08/01/2024) 90 tablet 3   rosuvastatin  (CRESTOR ) 20 MG tablet Take 1 tablet (20 mg total) by mouth daily. (Patient not taking: Reported on 08/01/2024) 90 tablet 3   tamsulosin  (FLOMAX ) 0.4 MG CAPS capsule Take 2 capsules (0.8 mg total) by mouth daily. (Patient not taking: Reported on 08/01/2024) 180 capsule 3   traZODone  (DESYREL ) 50 MG tablet Take 0.5-1 tablets (25-50 mg total) by mouth at bedtime. (Patient not taking: Reported on 08/01/2024) 90 tablet 3   No current facility-administered medications for this visit.     Physical Exam    VS:  BP 106/64 (BP Location: Left Arm, Patient Position: Sitting, Cuff Size: Normal)   Pulse (!) 47   Ht 5' 8 (1.727 m)   Wt 139 lb 8 oz (63.3 kg)   SpO2 96%   BMI 21.21 kg/m  , BMI Body mass index is 21.21 kg/m.          GEN: Well nourished, well developed, in no acute distress. HEENT: normal. Neck: Supple, no JVD, carotid bruits, or masses. Cardiac: RRR, no murmurs, rubs, or gallops. No clubbing, cyanosis, edema.  Radials 2+, posterior tibials 1+ bilaterally. Respiratory:  Respirations regular and unlabored, clear to auscultation bilaterally. GI: Soft, nontender, nondistended, BS + x 4. MS: no deformity or  atrophy. Skin: warm and dry, no rash. Neuro:  Strength and sensation are intact. Psych: Normal affect.  Accessory Clinical Findings    ECG personally reviewed by me today - EKG Interpretation Date/Time:  Tuesday August 01 2024 14:53:28 EDT Ventricular Rate:  47 PR Interval:  164 QRS Duration:  92 QT Interval:  438 QTC Calculation: 387 R Axis:   -49  Text Interpretation: Sinus bradycardia Left anterior fascicular block T wave abnormality, consider lateral ischemia Confirmed by Vivienne Bruckner [phone]) on 08/01/2024 3:01:52 PM   - no acute changes.  Lab Results  Component Value Date   WBC 6.6 07/25/2024   HGB 12.1 (L) 07/25/2024   HCT 37.5 (L) 07/25/2024   MCV 77.5 (L) 07/25/2024   PLT 198 07/25/2024   Lab Results  Component Value Date   CREATININE 1.29 (H) 07/25/2024   BUN 12 07/25/2024   NA 139 07/25/2024   K 4.6 07/25/2024   CL 108 07/25/2024   CO2 25  07/25/2024   Lab Results  Component Value Date   ALT 13 07/25/2024   AST 19 07/25/2024   ALKPHOS 40 07/25/2024   BILITOT 0.7 07/25/2024   Lab Results  Component Value Date   CHOL 173 04/20/2023   HDL 35.10 (L) 04/20/2023   LDLCALC 124 (H) 04/20/2023   TRIG 71.0 04/20/2023   CHOLHDL 5 04/20/2023    Lab Results  Component Value Date   HGBA1C 5.9 01/16/2022   Lab Results  Component Value Date   TSH 1.76 09/08/2023       Assessment & Plan    1.  Sinus bradycardia: Patient with a history of asymptomatic sinus bradycardia but was recently seen by oncology with a heart rate of 47 and reported intermittent lightheadedness.  He notes today, that he will occasionally experience orthostatic lightheadedness.  In the mornings, he is frequently fatigued but this resolves by midmorning.  ECG today shows sinus bradycardia at 47 with left anterior fascicular block, similar to prior ECG.  With ambulation, heart rate rose to 65, and he was asymptomatic without limitations.  In the setting of ongoing bradycardia and  intermittent lightheadedness, we agreed to place a 2-week ZIO monitor to evaluate for pauses/high-grade heart block and chronotropic competence.  Continue to avoid AV nodal blocking agents.  Lab work August 5 with a normal potassium, relatively stable creatinine at 1.29.  TSH was normal last September.  2.  Coronary artery disease: Significant diagonal disease on coronary CTA in June 2022.  He has been medically managed.  He does not experience angina.  He has chronic, stable dyspnea on exertion which is not a complaint today.  He remains on aspirin  and statin therapy.  3.  Peripheral arterial disease: Status post bilateral lower extremity interventions with recent abnormal ABIs and evidence of bilateral SFA occlusions on duplex.  He was seen by vascular surgery with plan for angiography however, proceeding was cost prohibitive.  He continues to have bilateral lower extremity claudication with prolonged walking and is currently managing symptoms with rest.  At this time, he we will continue to defer angiography and plans to follow-up with vascular surgery with an office visit later this year.  He remains on aspirin  and statin therapy.  4.  Hyperlipidemia:  LDL 124 last year.  Was due for repeat lipids, but did not have drawn.  Not fasting today.  Has order for fasting labs.  Cont statin.  5.  Tobacco abuse: Complete cessation advised.  6.  CLL: Followed by oncology.  7.  Anemia: Stable H&H earlier this month.  8.  Disposition: Follow-up ZIO monitor.  Overdue for follow-up lipids with order in place.  Patient plans follow-up with vascular surgery regarding ongoing claudication and PAD.  Lonni Meager, NP 08/01/2024, 3:20 PM

## 2024-08-01 NOTE — Patient Instructions (Signed)
 Medication Instructions:  No changes *If you need a refill on your cardiac medications before your next appointment, please call your pharmacy*  Lab Work: None ordered If you have labs (blood work) drawn today and your tests are completely normal, you will receive your results only by: MyChart Message (if you have MyChart) OR A paper copy in the mail If you have any lab test that is abnormal or we need to change your treatment, we will call you to review the results.  Testing/Procedures: None ordered  Follow-Up: At [facility], you and your health needs are our priority.  As part of our continuing mission to provide you with exceptional heart care, our providers are all part of one team.  This team includes your primary Cardiologist (physician) and Advanced Practice Providers or APPs (Physician Assistants and Nurse Practitioners) who all work together to provide you with the care you need, when you need it.  Your next appointment:   3 month(s)  Provider:   You may see Timothy Gollan, MD or one of the following Advanced Practice Providers on your designated Care Team:   Lonni Meager, NP   We recommend signing up for the patient portal called MyChart.  Sign up information is provided on this After Visit Summary.  MyChart is used to connect with patients for Virtual Visits (Telemedicine).  Patients are able to view lab/test results, encounter notes, upcoming appointments, etc.  Non-urgent messages can be sent to your provider as well.   To learn more about what you can do with MyChart, go to [email or web address].   Other Instructions ZIO AT Long term monitor-Live Telemetry  Your physician has requested you wear a ZIO patch monitor for 14 days.  This is a single patch monitor. [employer] supplies one patch monitor per enrollment. Additional  stickers are not available.  Please do not apply patch if you will be having a Nuclear Stress Test, Echocardiogram, Cardiac CT,  MRI,  or Chest Xray during the period you would be wearing the monitor. The patch cannot be worn during  these tests. You cannot remove and re-apply the ZIO AT patch monitor.  Your ZIO patch monitor will be mailed 3 day USPS to your address on file. It may take 3-5 days to  receive your monitor after you have been enrolled.  Once you have received your monitor, please review the enclosed instructions. Your monitor has  already been registered assigning a specific monitor serial # to you.   Billing and Patient Assistance Program information  [employer] has been supplied with any insurance information on record for billing. [employer] offers a sliding scale Patient Assistance Program for patients without insurance, or whose  insurance does not completely cover the cost of the ZIO patch monitor. You must apply for the  Patient Assistance Program to qualify for the discounted rate. To apply, call [employer] at [phone],  select option 4, select option 2 , ask to apply for the Patient Assistance Program, (you can request an  interpreter if needed). [employer] will ask your household income and how many people are in your  household. [employer] will quote your out-of-pocket cost based on this information. They will also be able  to set up a 12 month interest free payment plan if needed.  Applying the monitor   Shave hair from upper left chest.  Hold the abrader disc by orange tab. Rub the abrader in 40 strokes over left upper chest as indicated in  your monitor instructions.  Clean area  with 4 enclosed alcohol pads. Use all pads to ensure the area is cleaned thoroughly. Let  dry.  Apply patch as indicated in monitor instructions. Patch will be placed under collarbone on left side of  chest with arrow pointing upward.  Rub patch adhesive wings for 2 minutes. Remove the white label marked 1. Remove the white label  marked 2. Rub patch adhesive wings for 2 additional minutes.  While looking in a  mirror, press and release button in center of patch. A small green light will flash 3-4  times. This will be your only indicator that the monitor has been turned on.  Do not shower for the first 24 hours. You may shower after the first 24 hours.  Press the button if you feel a symptom. You will hear a small click. Record Date, Time and Symptom in  the Patient Log.   Starting the Gateway  In your kit there is a Audiological scientist box the size of a cellphone. This is Buyer, retail. It transmits all your  recorded data to [facility]. This box must always stay within 10 feet of you. Open the box and push the *  button. There will be a light that blinks orange and then green a few times. When the light stops  blinking, the Gateway is connected to the ZIO patch. Call [employer] at [phone] to confirm your monitor is transmitting.  Returning your monitor  Remove your patch and place it inside the Gateway. In the lower half of the Gateway there is a white  bag with prepaid postage on it. Place Gateway in bag and seal. Mail package back to [employer] as soon as  possible. Your physician should have your final report approximately 7 days after you have mailed back  your monitor. Call [facility] Customer Care at [phone] if you have questions regarding your ZIO AT  patch monitor. Call them immediately if you see an orange light blinking on your monitor.  If your monitor falls off in less than 4 days, contact our Monitor department at [phone]. If your  monitor becomes loose or falls off after 4 days call [employer] at [phone] for suggestions on  securing your monitor

## 2024-08-05 DIAGNOSIS — R55 Syncope and collapse: Secondary | ICD-10-CM | POA: Diagnosis not present

## 2024-08-11 ENCOUNTER — Other Ambulatory Visit: Payer: Self-pay

## 2024-08-15 ENCOUNTER — Other Ambulatory Visit: Payer: Self-pay

## 2024-08-17 ENCOUNTER — Other Ambulatory Visit: Payer: Self-pay

## 2024-08-22 ENCOUNTER — Other Ambulatory Visit: Payer: Self-pay

## 2024-08-22 ENCOUNTER — Other Ambulatory Visit (HOSPITAL_COMMUNITY): Payer: Self-pay

## 2024-08-24 ENCOUNTER — Other Ambulatory Visit: Payer: Self-pay

## 2024-08-29 ENCOUNTER — Ambulatory Visit

## 2024-09-04 ENCOUNTER — Other Ambulatory Visit (HOSPITAL_COMMUNITY): Payer: Self-pay

## 2024-09-04 ENCOUNTER — Other Ambulatory Visit: Payer: Self-pay

## 2024-09-04 NOTE — Progress Notes (Signed)
 Specialty Pharmacy Refill Coordination Note  [name] is a 73 y.o. male contacted today regarding refills of specialty medication(s) Zanubrutinib  (Brukinsa )   Patient requested Delivery   Delivery date: 09/06/24   Verified address: [address]  [place]   Medication will be filled on 09/05/24.

## 2024-09-04 NOTE — Progress Notes (Signed)
 Specialty Pharmacy Ongoing Clinical Assessment Note  [name] is a 74 y.o. male who is being followed by the specialty pharmacy service for RxSp Oncology   Patient's specialty medication(s) reviewed today: Zanubrutinib  (Brukinsa )   Missed doses in the last 4 weeks: 3   Patient/Caregiver did not have any additional questions or concerns.   Therapeutic benefit summary: Patient is achieving benefit   Adverse events/side effects summary: Experienced adverse events/side effects (patient has had some boughts of nausea vomiting and is unsure if they are from his Brukinsa  or a GI virus, he is feeling better at this time.)   Patient's therapy is appropriate to: Continue    Goals Addressed             This Visit's Progress    Slow Disease Progression   On track    Patient is on track. Patient will maintain adherence.          Follow up: 6 months  Silvano LOISE Dolly Specialty Pharmacist

## 2024-09-05 ENCOUNTER — Other Ambulatory Visit: Payer: Self-pay

## 2024-09-12 ENCOUNTER — Encounter: Payer: Self-pay | Admitting: Family

## 2024-09-12 ENCOUNTER — Ambulatory Visit (INDEPENDENT_AMBULATORY_CARE_PROVIDER_SITE_OTHER): Admitting: Family

## 2024-09-12 VITALS — BP 136/62 | HR 54 | Temp 97.9°F | Ht 68.0 in | Wt 137.6 lb

## 2024-09-12 DIAGNOSIS — R5383 Other fatigue: Secondary | ICD-10-CM | POA: Diagnosis not present

## 2024-09-12 DIAGNOSIS — R001 Bradycardia, unspecified: Secondary | ICD-10-CM | POA: Diagnosis not present

## 2024-09-12 DIAGNOSIS — Z125 Encounter for screening for malignant neoplasm of prostate: Secondary | ICD-10-CM

## 2024-09-12 DIAGNOSIS — Q678 Other congenital deformities of chest: Secondary | ICD-10-CM | POA: Diagnosis not present

## 2024-09-12 DIAGNOSIS — Z23 Encounter for immunization: Secondary | ICD-10-CM | POA: Diagnosis not present

## 2024-09-12 DIAGNOSIS — S20312A Abrasion of left front wall of thorax, initial encounter: Secondary | ICD-10-CM | POA: Insufficient documentation

## 2024-09-12 DIAGNOSIS — Z0000 Encounter for general adult medical examination without abnormal findings: Secondary | ICD-10-CM

## 2024-09-12 DIAGNOSIS — I2510 Atherosclerotic heart disease of native coronary artery without angina pectoris: Secondary | ICD-10-CM

## 2024-09-12 NOTE — Assessment & Plan Note (Addendum)
 Chronic, uncontrolled. Concerned dizziness, fatigue may be d/t bradycardia. Zio monitor was lost after 8 days. Provided zio number to reorder machine. Will follow.

## 2024-09-12 NOTE — Patient Instructions (Addendum)
 To get another Zio monitor box:   Call [employer] Customer Care at [phone] if you have questions regarding  your ZIO XT patch monitor. It may be that cardiology may need to place an order so may need to call cardiology if so,    If your monitor falls off in less than 4 days, contact our Monitor department at [phone].  If your monitor becomes loose or falls off after 4 days call [employer] at [phone] for  suggestions on securing your monitor     I  ordered ultrasound left breast and CT chest abdomen and pelvis  Let us  know if you dont hear back within 2 weeks in regards to an appointment being scheduled.   So that you are aware, if you are [facility] MyChart user , please pay attention to your MyChart messages as you may receive a MyChart message with a phone number to call and schedule this test/appointment own your own from our referral coordinator. This is a new process so I do not want you to miss this message.  If you are not a MyChart user, you will receive a phone call.    Health Maintenance, Male Adopting a healthy lifestyle and getting preventive care are important in promoting health and wellness. Ask your health care provider about: The right schedule for you to have regular tests and exams. Things you can do on your own to prevent diseases and keep yourself healthy. What should I know about diet, weight, and exercise? Eat a healthy diet  Eat a diet that includes plenty of vegetables, fruits, low-fat dairy products, and lean protein. Do not eat a lot of foods that are high in solid fats, added sugars, or sodium. Maintain a healthy weight Body mass index (BMI) is a measurement that can be used to identify possible weight problems. It estimates body fat based on height and weight. Your health care provider can help determine your BMI and help you achieve or maintain a healthy weight. Get regular exercise Get regular exercise. This is one of the most important  things you can do for your health. Most adults should: Exercise for at least 150 minutes each week. The exercise should increase your heart rate and make you sweat (moderate-intensity exercise). Do strengthening exercises at least twice a week. This is in addition to the moderate-intensity exercise. Spend less time sitting. Even light physical activity can be beneficial. Watch cholesterol and blood lipids Have your blood tested for lipids and cholesterol at 71 years of age, then have this test every 5 years. You may need to have your cholesterol levels checked more often if: Your lipid or cholesterol levels are high. You are older than 71 years of age. You are at high risk for heart disease. What should I know about cancer screening? Many types of cancers can be detected early and may often be prevented. Depending on your health history and family history, you may need to have cancer screening at various ages. This may include screening for: Colorectal cancer. Prostate cancer. Skin cancer. Lung cancer. What should I know about heart disease, diabetes, and high blood pressure? Blood pressure and heart disease High blood pressure causes heart disease and increases the risk of stroke. This is more likely to develop in people who have high blood pressure readings or are overweight. Talk with your health care provider about your target blood pressure readings. Have your blood pressure checked: Every 3-5 years if you are 48-53 years of age. Every year if  you are 35 years old or older. If you are between the ages of 45 and 38 and are a current or former smoker, ask your health care provider if you should have a one-time screening for abdominal aortic aneurysm (AAA). Diabetes Have regular diabetes screenings. This checks your fasting blood sugar level. Have the screening done: Once every three years after age 53 if you are at a normal weight and have a low risk for diabetes. More often and at a  younger age if you are overweight or have a high risk for diabetes. What should I know about preventing infection? Hepatitis B If you have a higher risk for hepatitis B, you should be screened for this virus. Talk with your health care provider to find out if you are at risk for hepatitis B infection. Hepatitis C Blood testing is recommended for: Everyone born from [birth year] through 1963. Anyone with known risk factors for hepatitis C. Sexually transmitted infections (STIs) You should be screened each year for STIs, including gonorrhea and chlamydia, if: You are sexually active and are younger than 71 years of age. You are older than 71 years of age and your health care provider tells you that you are at risk for this type of infection. Your sexual activity has changed since you were last screened, and you are at increased risk for chlamydia or gonorrhea. Ask your health care provider if you are at risk. Ask your health care provider about whether you are at high risk for HIV. Your health care provider may recommend a prescription medicine to help prevent HIV infection. If you choose to take medicine to prevent HIV, you should first get tested for HIV. You should then be tested every 3 months for as long as you are taking the medicine. Follow these instructions at home: Alcohol use Do not drink alcohol if your health care provider tells you not to drink. If you drink alcohol: Limit how much you have to 0-2 drinks a day. Know how much alcohol is in your drink. In the [place] one drink equals one 12 oz bottle of beer (355 mL), one 5 oz glass of wine (148 mL), or one 1 oz glass of hard liquor (44 mL). Lifestyle Do not use any products that contain nicotine or tobacco. These products include cigarettes, chewing tobacco, and vaping devices, such as e-cigarettes. If you need help quitting, ask your health care provider. Do not use street drugs. Do not share needles. Ask your health care provider for help  if you need support or information about quitting drugs. General instructions Schedule regular health, dental, and eye exams. Stay current with your vaccines. Tell your health care provider if: You often feel depressed. You have ever been abused or do not feel safe at home. Summary Adopting a healthy lifestyle and getting preventive care are important in promoting health and wellness. Follow your health care provider's instructions about healthy diet, exercising, and getting tested or screened for diseases. Follow your health care provider's instructions on monitoring your cholesterol and blood pressure. This information is not intended to replace advice given to you by your health care provider. Make sure you discuss any questions you have with your health care provider. Document Revised: 04/28/2021 Document Reviewed: 04/28/2021 Elsevier Patient Education  2024 [employer].

## 2024-09-12 NOTE — Progress Notes (Signed)
 Annual physical exam Assessment & Plan: Sch RN visit for pneumonia vaccine; due PCV20 at least 1 year after PCV13 ( he had in 2019).  Congratulated patient on diligence to exercise.   Orders: -     Urinalysis, Routine w reflex microscopic; Future  Flu vaccine need -     Flu vaccine HIGH DOSE PF(Fluzone Trivalent)  Need for shingles vaccine  Atherosclerosis of coronary artery of native heart without angina pectoris, unspecified vessel or lesion type -     Lipid panel; Future  Screening for prostate cancer -     PSA; Future -     Urinalysis, Routine w reflex microscopic; Future  Chest wall asymmetry Assessment & Plan: Asymmetric left chest wall adipose tissue .  Reassuring exam today. Noticed over 3-4 months without pain or alarming features.  D/D gynecomastia.   H/o CLL . Order a CT of the chest a/p , breast and axilla ultrasound of the left breast.   Orders: -     CT CHEST ABDOMEN PELVIS WO CONTRAST; Future -     US  LIMITED ULTRASOUND INCLUDING AXILLA LEFT BREAST ; Future  Sinus bradycardia Assessment & Plan: Chronic, uncontrolled. Concerned dizziness, fatigue may be d/t bradycardia. Zio monitor was lost after 8 days. Provided zio number to reorder machine. Will follow.    Other fatigue Assessment & Plan: Acute on chronic. Pending CT chest a/p, zio monitor. Will follow.        Return precautions given.   Risks, benefits, and alternatives of the medications and treatment plan prescribed today were discussed, and patient expressed understanding.   Education regarding symptom management and diagnosis given to patient on AVS either electronically or printed.  Return in about 2 months (around 11/12/2024).  Rollene Northern, FNP  Subjective:    Patient ID: [name], male    DOB: 04-14-1951, 73 y.o.   MRN: [id number]  CC: [name] is a 73 y.o. male who presents today for physical exam.    HPI: HPI Discussed the use of AI scribe software for clinical note  transcription with the patient, who gave verbal consent to proceed.  History of Present Illness   [name] is a 73 year old male with lymphocytic leukemia who presents with concerns of chest swelling and fatigue.  He has been experiencing fatigue over the past few days, describing it as a physical sense of tiredness rather than depression or anxiety. Some days are better than others, but he generally feels good. 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  He has noticed swelling on the left side of his chest, which he first observed about three to four months ago. He notes that the swelling has decreased in size over time. No pain is associated with the swelling. He has previously consulted a cardiologist and underwent an EKG, which did not reveal any cardiac issues. He also wore a Zio monitor for rhythm assessment but lost the device before completing the monitoring period.  His weight has fluctuated, reaching up to 140 pounds, which he considers positive as he has been concerned about losing too much weight in the past. He is currently around 137 pounds and feels good about this weight. He reports variability in his appetite, with some days being better than others.  He is currently on zanubrutinib  160 mg twice daily for chronic lymphocytic leukemia. Denies fevers, chills, or unusual  bone pain.related to diabetes  He is also taking Flomax  and reports urinating frequently but finds the medication helpful. He does not currently follow with urology and has no family history of prostate cancer. His PSA was 1.42 a year ago.       CT chest a/p 07/16/23 1. Marked response to therapy of abdominal and mild-to-moderate response to therapy of pelvic adenopathy since 11/09/2022 abdominopelvic CT. 2. No evidence of active lymphoma within the chest. 3. Incidental findings,  including: Aortic atherosclerosis (ICD10-I70.0), coronary artery atherosclerosis and emphysema (ICD10-J43.9).  Follow-up Dr. Babara 10/31/24; with last seen 07/25/2024 for thalassemia minor, microcytic anemia, recurrent CLL. 82 Follow-up cardiology 08/01/2024 bradycardia, ordered ZIO monitor at that time ( no report in chart).  Avoid AV nodal blocking agents.  Follow-up with vascular peripheral artery disease. Colorectal  Cancer Screening: UTD , Dr. Therisa 04/20/2022, repeat in 3 years.  Prostate Cancer Screening: Discussed screening with PSA versus following USPSTF does not recommend routine PSA screening for men 70 years older.  No family history of prostate cancer. He prefers screening with PSA   Lung Cancer Screening: He has CT chest/ A/P for CLL surveillance.   No family h/o AAA  Immunizations       Tetanus - UTD        Pneumococcal - Candidate for PCV20  Exercise: Gets regular exercise with yardwork.   Alcohol use:  none Smoking/tobacco use: smoker.    Health Maintenance  Topic Date Due   Zoster (Shingles) Vaccine (1 of 2) Never done   Pneumococcal Vaccine for age over 47 (2 of 2 - PPSV23, PCV20, or PCV21) 12/05/2018   Medicare Annual Wellness Visit  08/04/2024   COVID-19 Vaccine (3 - Pfizer risk series) 09/28/2024*   Colon Cancer Screening  04/20/2025   DTaP/Tdap/Td vaccine (3 - Td or Tdap) 04/01/2028   Flu Shot  Completed   Hepatitis C Screening  Completed   HPV Vaccine  Aged Out   Meningitis B Vaccine  Aged Out  *Topic was postponed. The date shown is not the original due date.     ALLERGIES: Penicillins and Latex  Current Outpatient Medications on File Prior to Visit  Medication Sig Dispense Refill   aspirin  EC 81 MG tablet Take 81 mg by mouth daily. Swallow whole. 90 tablet 3   guaiFENesin  (MUCINEX ) 600 MG 12 hr tablet Take 2 tablets (1,200 mg total) by mouth 2 (two) times daily. 28 tablet 3   pantoprazole  (PROTONIX ) 40 MG tablet Take 1 tablet (40 mg total) by mouth  daily. Prior to breakfast, 30 min to 1 hr 90 tablet 3   rosuvastatin  (CRESTOR ) 20 MG tablet Take 1 tablet (20 mg total) by mouth daily. 90 tablet 3   tamsulosin  (FLOMAX ) 0.4 MG CAPS capsule Take 2 capsules (0.8 mg total) by mouth daily. 180 capsule 3   traZODone  (DESYREL ) 50 MG tablet Take 0.5-1 tablets (25-50 mg total) by mouth at bedtime. 90 tablet 3   zanubrutinib  (BRUKINSA ) 80 MG capsule Take 2 capsules (160 mg total) by mouth 2 (two) times daily. 120 capsule 3   No current facility-administered medications on file prior to visit.    Review of Systems  Constitutional:  Positive for fatigue. Negative for chills and fever.  HENT:  Negative for congestion.   Respiratory:  Negative for cough and shortness of breath.   Cardiovascular:  Negative for chest pain, palpitations and leg swelling.  Gastrointestinal:  Negative for nausea and vomiting.      Objective:  BP 136/62   Pulse (!) 54   Temp 97.9 F (36.6 C) (Oral)   Ht 5' 8 (1.727 m)   Wt 137 lb 9.6 oz (62.4 kg)   SpO2 98%   BMI 20.92 kg/m   BP Readings from Last 3 Encounters:  09/12/24 136/62  08/01/24 106/64  07/25/24 128/69   Wt Readings from Last 3 Encounters:  09/12/24 137 lb 9.6 oz (62.4 kg)  08/01/24 139 lb 8 oz (63.3 kg)  07/25/24 141 lb 4.8 oz (64.1 kg)      09/12/2024   10:34 AM 09/08/2023   12:21 PM 08/05/2023   12:56 PM  Depression screen PHQ 2/9  Decreased Interest 1 0 0  Down, Depressed, Hopeless 0 0 0  PHQ - 2 Score 1 0 0  Altered sleeping 3 0 0  Tired, decreased energy 0 0 0  Change in appetite 0 0 0  Feeling bad or failure about yourself  3 0 0  Trouble concentrating 0 0 0  Moving slowly or fidgety/restless 0 0 0  Suicidal thoughts 0 0 0  PHQ-9 Score 7 0 0  Difficult doing work/chores Not difficult at all Not difficult at all Not difficult at all      09/12/2024   10:36 AM 04/20/2023   11:15 AM 07/22/2022   11:42 AM 07/22/2022   11:26 AM  GAD 7 : Generalized Anxiety Score  Nervous, Anxious,  on Edge 0 2 0 0  Control/stop worrying 0 0 0 0  Worry too much - different things 0 0 0 0  Trouble relaxing 1 1 0 0  Restless 0 0 0 0  Easily annoyed or irritable 0 0 0 0  Afraid - awful might happen 0 0 0 0  Total GAD 7 Score 1 3 0 0  Anxiety Difficulty Not difficult at all Not difficult at all Not difficult at all Not difficult at all      Physical Exam Vitals reviewed.  Constitutional:      Appearance: Normal appearance. He is well-developed.  Neck:     Thyroid : No thyroid  mass or thyromegaly.  Cardiovascular:     Rate and Rhythm: Regular rhythm.     Heart sounds: Normal heart sounds.  Pulmonary:     Effort: Pulmonary effort is normal. No respiratory distress.     Breath sounds: Normal breath sounds. No wheezing, rhonchi or rales.  Chest:     Chest wall: No mass.       Comments: Slightly more adipose tissue over left breast.  Nontender with palpation.  No masses appreciated. Abdominal:     General: Bowel sounds are normal. There is no distension.     Palpations: Abdomen is soft. Abdomen is not rigid. There is no fluid wave or mass.     Tenderness: There is no abdominal tenderness. There is no guarding or rebound. Negative signs include Murphy's sign and McBurney's sign.  Lymphadenopathy:     Head:     Right side of head: No submental, submandibular, tonsillar, preauricular, posterior auricular or occipital adenopathy.     Left side of head: No submental, submandibular, tonsillar, preauricular, posterior auricular or occipital adenopathy.     Cervical: No cervical adenopathy.  Skin:    General: Skin is warm and dry.  Neurological:     Mental Status: He is alert.  Psychiatric:        Speech: Speech normal.        Behavior: Behavior normal.

## 2024-09-12 NOTE — Assessment & Plan Note (Addendum)
 Sch RN visit for pneumonia vaccine; due PCV20 at least 1 year after PCV13 ( he had in 2019).  Congratulated patient on diligence to exercise.

## 2024-09-12 NOTE — Assessment & Plan Note (Addendum)
 Acute on chronic. Pending CT chest a/p, zio monitor. Will follow.

## 2024-09-12 NOTE — Assessment & Plan Note (Signed)
 Asymmetric left chest wall adipose tissue .  Reassuring exam today. Noticed over 3-4 months without pain or alarming features.  D/D gynecomastia.   H/o CLL . Order a CT of the chest a/p , breast and axilla ultrasound of the left breast.

## 2024-09-14 ENCOUNTER — Telehealth: Payer: Self-pay

## 2024-09-14 NOTE — Telephone Encounter (Signed)
-----   Message from Rollene Northern sent at 09/12/2024  2:17 PM EDT ----- Meant to sch 2 mos f/u with pt due to fatigue; please sch

## 2024-09-15 ENCOUNTER — Other Ambulatory Visit: Payer: Self-pay | Admitting: Family

## 2024-09-15 DIAGNOSIS — N62 Hypertrophy of breast: Secondary | ICD-10-CM

## 2024-09-15 DIAGNOSIS — Q678 Other congenital deformities of chest: Secondary | ICD-10-CM

## 2024-09-15 DIAGNOSIS — N63 Unspecified lump in unspecified breast: Secondary | ICD-10-CM

## 2024-09-20 ENCOUNTER — Encounter: Payer: Self-pay | Admitting: Pharmacist

## 2024-09-20 NOTE — Progress Notes (Signed)
 Pharmacy Quality Measure Review  Statin Therapy for Patients with Cardiovascular Disease [facility]) Patient with documented hx of ASCVD, not on statin therapy.   Rosuvastatin  20 mg on med list, though last refilled in 2024.  Hx non-compliance with pravastatin .    Upcoming PCP visit in Nov. Consider discussion of statin resumption vs trial of alternative statin (atorvastatin).   ICD-10-CM code exceptions:  Muscular pain:  Myopathy ICD-10-CM codes include G72.0, G72.2, and G72.9.  Myositis ICD-10-CM codes include M60.80, M60.811, M60.812, and more.   Myalgia ICD-10-CM codes include M79.10, M79.11, M79.12, and more.  Rhabdomyolysis ICD-10-CM code is M62.82.   Future message sent to PCP, 11/24. Visit note placed.   Future Appointments  Date Time Provider Department Center  09/22/2024 11:00 AM [facility]  10/31/2024 10:30 AM [facility] None  10/31/2024 10:45 AM [name], MD [facility] None  11/01/2024  1:30 PM Vivienne Lonni Ingle, NP [facility] None  11/13/2024 10:30 AM Dineen Rollene MATSU, FNP [facility] [id number]

## 2024-09-22 ENCOUNTER — Ambulatory Visit: Admission: RE | Admit: 2024-09-22 | Source: Ambulatory Visit

## 2024-10-02 ENCOUNTER — Other Ambulatory Visit (HOSPITAL_COMMUNITY): Payer: Self-pay

## 2024-10-04 ENCOUNTER — Other Ambulatory Visit: Payer: Self-pay | Admitting: Pharmacy Technician

## 2024-10-04 ENCOUNTER — Other Ambulatory Visit: Payer: Self-pay

## 2024-10-04 NOTE — Progress Notes (Signed)
 Specialty Pharmacy Refill Coordination Note  [name] is a 73 y.o. male contacted today regarding refills of specialty medication(s) Zanubrutinib  (Brukinsa )   Patient requested Delivery   Delivery date: 10/06/24   Verified address: [address] [place]   Medication will be filled on 10/05/24.

## 2024-10-26 ENCOUNTER — Other Ambulatory Visit: Payer: Self-pay

## 2024-10-27 ENCOUNTER — Other Ambulatory Visit: Payer: Self-pay

## 2024-10-27 ENCOUNTER — Other Ambulatory Visit: Payer: Self-pay | Admitting: Pharmacist

## 2024-10-27 ENCOUNTER — Telehealth: Payer: Self-pay | Admitting: Pharmacy Technician

## 2024-10-27 DIAGNOSIS — C9110 Chronic lymphocytic leukemia of B-cell type not having achieved remission: Secondary | ICD-10-CM

## 2024-10-27 MED ORDER — ZANUBRUTINIB 160 MG PO TABS
160.0000 mg | ORAL_TABLET | Freq: Two times a day (BID) | ORAL | 2 refills | Status: AC
  Filled 2024-10-27 – 2024-11-20 (×3): qty 60, 30d supply, fill #0
  Filled 2024-12-13 – 2024-12-29 (×2): qty 60, 30d supply, fill #1
  Filled 2025-01-19 – 2025-01-22 (×2): qty 60, 30d supply, fill #2

## 2024-10-27 NOTE — Telephone Encounter (Signed)
 Brukinsa  is changing from capsules to tablets.  New prescription sent for Brukinsa  tablets to patient's pharmacy.

## 2024-10-27 NOTE — Telephone Encounter (Signed)
 Oral Oncology Patient Advocate Encounter  Was successful in securing patient a $8000 grant from [facility] to provide copayment coverage for Brukinsa .  This will keep the out of pocket expense at $0.     [employer] ID: [id number]   The billing information is as follows and has been shared with [facility].    RxBin: [id number] PCN: [id number] Member ID: [id number] Group ID: [id number] Dates of Eligibility: 10/15/2024 through 10/14/2025  Fund:  Chronic Lymphocytic Leukemia  [name] (Patty) Chet Burnet, CPhT  [facility], Zelda Salmon, [facility] - Oral Chemotherapy Patient Advocate Specialist III Phone: [phone]  Fax: [phone]

## 2024-10-31 ENCOUNTER — Encounter: Payer: Self-pay | Admitting: Oncology

## 2024-10-31 ENCOUNTER — Inpatient Hospital Stay (HOSPITAL_BASED_OUTPATIENT_CLINIC_OR_DEPARTMENT_OTHER): Admitting: Oncology

## 2024-10-31 ENCOUNTER — Inpatient Hospital Stay: Attending: Oncology

## 2024-10-31 ENCOUNTER — Other Ambulatory Visit: Payer: Self-pay

## 2024-10-31 VITALS — BP 122/74 | HR 52 | Temp 96.1°F | Resp 18 | Wt 143.1 lb

## 2024-10-31 DIAGNOSIS — C9110 Chronic lymphocytic leukemia of B-cell type not having achieved remission: Secondary | ICD-10-CM

## 2024-10-31 DIAGNOSIS — Z720 Tobacco use: Secondary | ICD-10-CM | POA: Diagnosis not present

## 2024-10-31 DIAGNOSIS — F17290 Nicotine dependence, other tobacco product, uncomplicated: Secondary | ICD-10-CM | POA: Diagnosis not present

## 2024-10-31 DIAGNOSIS — D693 Immune thrombocytopenic purpura: Secondary | ICD-10-CM | POA: Diagnosis not present

## 2024-10-31 DIAGNOSIS — D649 Anemia, unspecified: Secondary | ICD-10-CM | POA: Diagnosis not present

## 2024-10-31 DIAGNOSIS — D563 Thalassemia minor: Secondary | ICD-10-CM

## 2024-10-31 DIAGNOSIS — Z9221 Personal history of antineoplastic chemotherapy: Secondary | ICD-10-CM | POA: Insufficient documentation

## 2024-10-31 LAB — CBC WITH DIFFERENTIAL (CANCER CENTER ONLY)
Abs Immature Granulocytes: 0.28 K/uL — ABNORMAL HIGH (ref 0.00–0.07)
Basophils Absolute: 0.1 K/uL (ref 0.0–0.1)
Basophils Relative: 1 %
Eosinophils Absolute: 0.3 K/uL (ref 0.0–0.5)
Eosinophils Relative: 4 %
HCT: 36.1 % — ABNORMAL LOW (ref 39.0–52.0)
Hemoglobin: 11.3 g/dL — ABNORMAL LOW (ref 13.0–17.0)
Immature Granulocytes: 5 %
Lymphocytes Relative: 34 %
Lymphs Abs: 1.9 K/uL (ref 0.7–4.0)
MCH: 24.2 pg — ABNORMAL LOW (ref 26.0–34.0)
MCHC: 31.3 g/dL (ref 30.0–36.0)
MCV: 77.5 fL — ABNORMAL LOW (ref 80.0–100.0)
Monocytes Absolute: 0.7 K/uL (ref 0.1–1.0)
Monocytes Relative: 12 %
Neutro Abs: 2.5 K/uL (ref 1.7–7.7)
Neutrophils Relative %: 44 %
Platelet Count: 229 K/uL (ref 150–400)
RBC: 4.66 MIL/uL (ref 4.22–5.81)
RDW: 17.6 % — ABNORMAL HIGH (ref 11.5–15.5)
WBC Count: 5.7 K/uL (ref 4.0–10.5)
nRBC: 0 % (ref 0.0–0.2)

## 2024-10-31 LAB — CMP (CANCER CENTER ONLY)
ALT: 10 U/L (ref 0–44)
AST: 17 U/L (ref 15–41)
Albumin: 3.8 g/dL (ref 3.5–5.0)
Alkaline Phosphatase: 39 U/L (ref 38–126)
Anion gap: 7 (ref 5–15)
BUN: 10 mg/dL (ref 8–23)
CO2: 24 mmol/L (ref 22–32)
Calcium: 8.8 mg/dL — ABNORMAL LOW (ref 8.9–10.3)
Chloride: 108 mmol/L (ref 98–111)
Creatinine: 1.11 mg/dL (ref 0.61–1.24)
GFR, Estimated: >60 mL/min (ref >60)
Glucose, Bld: 96 mg/dL (ref 70–99)
Potassium: 4.3 mmol/L (ref 3.5–5.1)
Sodium: 139 mmol/L (ref 135–145)
Total Bilirubin: 0.6 mg/dL (ref 0.0–1.2)
Total Protein: 6.1 g/dL — ABNORMAL LOW (ref 6.5–8.1)

## 2024-10-31 LAB — LACTATE DEHYDROGENASE: LDH: 116 U/L (ref 105–235)

## 2024-10-31 NOTE — Assessment & Plan Note (Addendum)
 Recurrent CLL, trisomy 12 and TP53 mutated, IgVH Somatic Hypermutation unmutated He did not Acalabrutinib  due to diarrhea. He prefers not to try reduced dose, S/p Rituximab  weekly x 4, CT showed progression--> Abdominal mass biopsy confirmed CLL, no evidence of transformation--> Zanubrutinib  160 mg twice daily. --? 06/2023 CT showed partial response.  Labs reviewed and discussed with patient. Wbc has normalized Overall he tolerates Zanubrutinib  160 mg twice daily.  Recommend patient to continue current regimen.

## 2024-10-31 NOTE — Assessment & Plan Note (Signed)
 Recommend smoke cessation.

## 2024-10-31 NOTE — Assessment & Plan Note (Signed)
 Hb has slightly decreased.  monitor

## 2024-10-31 NOTE — Progress Notes (Signed)
 Hematology/Oncology Progress note [phone] [phone]   Chief Complaint: [name] is a 73 y.o.  male presents to follow up for chronic lymphocytic lymphoma.   ASSESSMENT & PLAN:   Cancer Staging  Chronic lymphocytic leukemia (CLL), B-cell (HCC) Staging form: Chronic Lymphocytic Leukemia / Small Lymphocytic Lymphoma, AJCC 8th Edition - Clinical stage from 07/23/2017: Modified Rai Stage III (Modified Rai risk: High, Lymphocytosis: Present, Adenopathy: Present, Organomegaly: Absent, Anemia: Present, Thrombocytopenia: Absent) - Signed by [name], MD on 02/19/2022   Chronic lymphocytic leukemia (CLL), B-cell (HCC) Recurrent CLL, trisomy 12 and TP53 mutated, IgVH Somatic Hypermutation unmutated He did not Acalabrutinib  due to diarrhea. He prefers not to try reduced dose, S/p Rituximab  weekly x 4, CT showed progression--> Abdominal mass biopsy confirmed CLL, no evidence of transformation--> Zanubrutinib  160 mg twice daily. --? 06/2023 CT showed partial response.  Labs reviewed and discussed with patient. Wbc has normalized Overall he tolerates Zanubrutinib  160 mg twice daily.  Recommend patient to continue current regimen.   Beta thalassemia minor Beta thalassemia minor, with microcytic anemia. Stable counts.   Normocytic anemia Hb has slightly decreased.  monitor  Tobacco abuse Recommend smoke cessation.   Follow-up  3 months lab MD    All questions were answered. The patient knows to call the clinic with any problems, questions or concerns.  Call Babara, MD, PhD [facility] Hematology Oncology 10/31/2024     HPI:   Oncology History  Chronic lymphocytic leukemia (CLL), B-cell (HCC)  09/13/2014 Initial Diagnosis   CLL - hemolytic anemia in 01/2014.  He was treated with prednisone . 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  -Oct 2020 second opinion at [facility] and was evaluated by heme-onc Dr.Ellis.  Patient was recommended for watchful waiting   10/02/2014 - 10/23/2014 Chemotherapy   -10/02/2014- 10/23/2014  4 weekly cycles of Rituxan   -07/23/2017 Decision was made to start Ibrutinib  as patient is very symptomatic (fatigue and weight loss, lack of appetite). Patient did not show up at the chemo education class   05/08/2022 Imaging   CT CHEST, ABDOMEN, AND PELVIS WITH CONTRAST 1 unchanged enlarged bilateral axillary lymph nodes and prominent pretracheal mediastinal lymph nodes. 2. However, when compared to most recent prior CT examination of the abdomen and pelvis dated 06/24/2016, there are numerous, extremely bulky lymph nodes throughout the abdomen and pelvis, which are greatly increased in size. Newly enlarged left inguinal lymph nodes, in keeping with reported palpable lymphadenopathy. 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 3. Mild right hydronephrosis; the proximal ureter appears compressed or obstructed by overlying lymphadenopathy. 4. Emphysema.5. Coronary artery disease.   05/30/2022 Imaging   PET scan showed  1. Again seen is massive nodal conglomeration within the abdomen with mildly diffuse increased tracer uptake with SUV max of 3.15.There also enlarged bilateral pelvic and left inguinal lymph nodes which exhibit mild increased tracer uptake compatible with residual/recurrent lymphoma. Imaging findings are compatible with residual/recurrent metabolically active low-grade lymphoma. 2. No significant tracer uptake associated with the borderline enlarged bilateral axillary lymph nodes.3. Unchanged appearance of small nonspecific subpleural nodules within the periphery of the upper lobes measuring up to 4 mm. These are too small to characterize by PET-CT.4. Aortic Atherosclerosis (ICD10-I70.0) and Emphysema (ICD10-J43.9). 5. Coronary artery calcifications.  6. Normal size spleen with background activity similar to the liver   06/22/2022 -  Chemotherapy  acalabrutinib  100 mg twice daily.   07/27/2022 - 08/20/2022 Chemotherapy   Patient is on Treatment Plan : ITP Rituximab  q7d x 4 cycles     11/09/2022 Imaging   CT abdomen pelvis w contrast  1. Progressive bulky retroperitoneal and mesenteric lymphadenopathy consistent with progressive lymphoma. 2. No evidence of bowel obstruction, perforation or abscess. 3. Stable small amount of free pelvic fluid. 4. Aortic Atherosclerosis (ICD10-I70.0). Chronically occluded right femoral arterial stent    11/09/2022 [facility] Admission   Patient presented to emergency room for evaluation of shortness of breath.  He has noticed progressive worsening of abdominal swelling and distention.  Associated with shortness of breath.  Patient was started on diuretics.     11/24/2022 Procedure   CT guided biopsy of abdomen mass  Pathology showed CLL/SLL Core biopsy sections demonstrate lymphoid tissue with partially effaced follicular architecture.  The bulk of the tissue consists of sheets of small lymphocytes with dense chromatin and scant cytoplasm.  There is no significant increase in larger prolymphocytic type cells.  Relatively few small germinal centers are appreciated.  Concurrent flow cytometric studies demonstrate a CD5+, CD23 + monoclonal B-cell population comprising 87% of total lymphoid cells and 100% of B cells.  The phenotype is compatible with chronic lymphocytic leukemia/small lymphocytic lymphoma.  There are no features in the current sample to suggest progression to higher grade process. Clinical and radiographic correlation is recommended.    12/09/2022 -  Chemotherapy   Zanubrutinib  160 mg twice daily     INTERVAL HISTORY 73 y.o. male with history of CLL presents for follow-up. Patient has been on Zanubrutinib  160 mg twice daily, Denies lightheaded or dizziness. He follows up with cardiology  for bradycardia.  Denies any nausea vomiting, denies abdominal pain. Occasionally he has diarrhea   Past Medical History:  Diagnosis Date   Anemia 2008   Arthritis    CAD (coronary artery disease)    a. 05/2021 Cor CTA: LM nl, LAD 25-49%, D1 >70 (FFRct 0.73), LCXnl, RCA <25% throughout-->Med rx.   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 Babara   History of echocardiogram    a. 05/2022 Echo: EF 55-60%, no rwma, nl RV fxn, mildly dil RA, mild MR, mild AI. Asc Ao 36mm.   Hyperlipidemia    Low back pain    Peripheral vascular disease    a. 04/2018 R SFA/popliteal stenting; b. 05/2018 L SFA/popliteal stenting.    Past Surgical History:  Procedure Laterality Date   COLONOSCOPY     COLONOSCOPY WITH PROPOFOL  N/A 07/23/2015   Procedure: COLONOSCOPY WITH PROPOFOL ;  Surgeon: Reyes LELON Cota, MD;  Location: [facility] ENDOSCOPY;  Service: Endoscopy;  Laterality: N/A;   COLONOSCOPY WITH PROPOFOL  N/A 04/20/2022   Procedure: COLONOSCOPY WITH PROPOFOL ;  Surgeon: Therisa Bi, MD;  Location: [facility] ENDOSCOPY;  Service: Gastroenterology;  Laterality: N/A;   ESOPHAGOGASTRODUODENOSCOPY (EGD) WITH PROPOFOL  N/A 08/20/2015   Procedure: ESOPHAGOGASTRODUODENOSCOPY (EGD) WITH PROPOFOL ;  Surgeon: Reyes LELON Cota, MD;  Location: [facility] ENDOSCOPY;  Service: Endoscopy;  Laterality: N/A;   LOWER EXTREMITY ANGIOGRAPHY Left 05/19/2018   Procedure: LOWER EXTREMITY ANGIOGRAPHY;  Surgeon: Marea Selinda RAMAN, MD;  Location: [facility] INVASIVE CV LAB;  Service: Cardiovascular;  Laterality: Left;   LOWER EXTREMITY ANGIOGRAPHY Right 06/15/2018   Procedure: LOWER EXTREMITY ANGIOGRAPHY;  Surgeon: Marea Selinda RAMAN, MD;  Location: [facility] INVASIVE CV LAB;  Service: Cardiovascular;  Laterality: Right;   lymp node removal Right 2011   arm   left  arm 2015   LYMPH NODE BIOPSY  08/2014    Family History  Problem Relation Age of Onset   Alcohol abuse Mother    Cirrhosis Mother    Heart attack Father    Kidney cancer Neg  Hx        lung cancer   Bladder Cancer Neg Hx    Prostate cancer Neg Hx    AAA (abdominal aortic aneurysm) Neg Hx     Social History:  reports that he has been smoking cigars. He has never used smokeless tobacco. He reports that he does not drink alcohol and does not use drugs.  Allergies:  Allergies  Allergen Reactions   Penicillins     Other reaction(s): Rash, Hives ()   Latex Rash   Review of Systems:  Review of Systems  Constitutional:  Negative for chills, fever, malaise/fatigue and weight loss.  HENT:  Negative for ear discharge, ear pain, nosebleeds and sore throat.   Eyes:  Negative for photophobia, pain and redness.  Respiratory:  Negative for cough, hemoptysis, sputum production, shortness of breath and wheezing.   Cardiovascular:  Negative for chest pain, palpitations and leg swelling.  Gastrointestinal:  Positive for diarrhea. Negative for abdominal pain, blood in stool, constipation, heartburn, nausea and vomiting.  Genitourinary:  Negative for dysuria and frequency.  Musculoskeletal:  Negative for myalgias and neck pain.  Skin:  Negative for rash.  Neurological:  Negative for dizziness, tingling, tremors, weakness and headaches.  Endo/Heme/Allergies:  Does not bruise/bleed easily.  Psychiatric/Behavioral:  Negative for depression, hallucinations and suicidal ideas.      Current Outpatient Medications on File Prior to Visit  Medication Sig Dispense Refill   aspirin  EC 81 MG tablet Take 81 mg by mouth daily. Swallow whole. 90 tablet 3   guaiFENesin  (MUCINEX ) 600 MG 12 hr tablet Take 2 tablets (1,200 mg total) by mouth 2 (two) times daily. 28 tablet 3   pantoprazole  (PROTONIX ) 40 MG tablet Take 1 tablet (40 mg total) by mouth daily. Prior to breakfast, 30 min to 1 hr 90 tablet 3   rosuvastatin  (CRESTOR ) 20 MG tablet Take 1 tablet (20 mg total) by mouth daily. 90 tablet 3   tamsulosin  (FLOMAX ) 0.4 MG CAPS capsule Take 2 capsules (0.8 mg total) by mouth daily. 180  capsule 3   traZODone  (DESYREL ) 50 MG tablet Take 0.5-1 tablets (25-50 mg total) by mouth at bedtime. 90 tablet 3   zanubrutinib  (BRUKINSA ) 160 MG tablet Take 1 tablet (160 mg total) by mouth 2 (two) times daily. 60 tablet 2   No current facility-administered medications on file prior to visit.    Physical Exam: Blood pressure 122/74, pulse (!) 52, temperature (!) 96.1 F (35.6 C), temperature source Tympanic, resp. rate 18, weight 143 lb 1.6 oz (64.9 kg), SpO2 100%. Physical Exam Constitutional:      General: He is not in acute distress.    Appearance: He is not diaphoretic.  HENT:     Head: Normocephalic.  Eyes:     General: No scleral icterus. Cardiovascular:     Rate and Rhythm: Regular rhythm. Bradycardia present.  Pulmonary:     Effort: Pulmonary effort is normal. No respiratory distress.     Breath sounds: No wheezing.  Abdominal:     General: Bowel sounds are normal. There is no distension.     Palpations: Abdomen is soft. There is no mass.  Genitourinary:    Comments:   Musculoskeletal:        General:  Normal range of motion.     Cervical back: Normal range of motion.  Skin:    General: Skin is warm and dry.     Findings: No erythema.  Neurological:     Mental Status: He is alert and oriented to person, place, and time. Mental status is at baseline.     Cranial Nerves: No cranial nerve deficit.     Motor: No abnormal muscle tone.  Psychiatric:        Mood and Affect: Mood and affect normal.     LABORATORY RESULTS.    Latest Ref Rng & Units 10/31/2024   10:16 AM 07/25/2024    9:55 AM 04/24/2024   10:10 AM  CBC  WBC 4.0 - 10.5 K/uL 5.7  6.6  8.1   Hemoglobin 13.0 - 17.0 g/dL 88.6  87.8  87.7   Hematocrit 39.0 - 52.0 % 36.1  37.5  39.0   Platelets 150 - 400 K/uL 229  198  258       Latest Ref Rng & Units 10/31/2024   10:16 AM 07/25/2024    9:56 AM 04/24/2024   10:10 AM  CMP  Glucose 70 - 99 mg/dL 96  95  859   BUN 8 - 23 mg/dL 10  12  11    Creatinine 0.61  - 1.24 mg/dL 8.88  8.70  8.88   Sodium 135 - 145 mmol/L 139  139  135   Potassium 3.5 - 5.1 mmol/L 4.3  4.6  4.3   Chloride 98 - 111 mmol/L 108  108  103   CO2 22 - 32 mmol/L 24  25  24    Calcium  8.9 - 10.3 mg/dL 8.8  8.9  8.6   Total Protein 6.5 - 8.1 g/dL 6.1  5.9  6.6   Total Bilirubin 0.0 - 1.2 mg/dL 0.6  0.7  0.8   Alkaline Phos 38 - 126 U/L 39  40  48   AST 15 - 41 U/L 17  19  21    ALT 0 - 44 U/L 10  13  16       PATHOLOGY:  11/02/2016 Peripheral blood FISH studies revealed 20% of nuclei positive for loss of 1 ATM signal, 63% of nuclei positive for trisomy 12, and 32% of nuclei positive for loss of 1 TP53 signal. Results for CCND1/IGH and 13q were normal.  09/13/2014 Left axillary node biopsy on  confirmed B-cell small lymphocytic lymphoma (B-CLL/SLL)   IMAGE STUDIES No results found.

## 2024-10-31 NOTE — Assessment & Plan Note (Signed)
 Beta thalassemia minor, with microcytic anemia. Stable counts.

## 2024-11-01 ENCOUNTER — Ambulatory Visit: Attending: Nurse Practitioner | Admitting: Nurse Practitioner

## 2024-11-01 NOTE — Progress Notes (Deleted)
 Office Visit    Patient Name: [name] Date of Encounter: 11/01/2024  Primary Care Provider:  Dineen Rollene MATSU, FNP Primary Cardiologist:  Evalene Lunger, MD  Cardiology APP:  Vivienne Lonni Ingle, NP   Chief Complaint    73 y.o. male with a history of coronary artery disease, hyperlipidemia, peripheral arterial disease status post lower extremity interventions, CLL, anemia, and tobacco abuse, who presents for follow-up related to bradycardia.   Past Medical History   Subjective   Past Medical History:  Diagnosis Date   Anemia 2008   Arthritis    CAD (coronary artery disease)    a. 05/2021 Cor CTA: LM nl, LAD 25-49%, D1 >70 (FFRct 0.73), LCXnl, RCA <25% throughout-->Med rx.   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 Babara   History of echocardiogram    a. 05/2022 Echo: EF 55-60%, no rwma, nl RV fxn, mildly dil RA, mild MR, mild AI. Asc Ao 36mm.   Hyperlipidemia    Low back pain    Peripheral vascular disease    a. 04/2018 R SFA/popliteal stenting; b. 05/2018 L SFA/popliteal stenting.   Past Surgical History:  Procedure Laterality Date   COLONOSCOPY     COLONOSCOPY WITH PROPOFOL  N/A 07/23/2015   Procedure: COLONOSCOPY WITH PROPOFOL ;  Surgeon: Reyes LELON Cota, MD;  Location: [facility] ENDOSCOPY;  Service: Endoscopy;  Laterality: N/A;   COLONOSCOPY WITH PROPOFOL  N/A 04/20/2022   Procedure: COLONOSCOPY WITH PROPOFOL ;  Surgeon: Therisa Bi, MD;  Location: [facility] ENDOSCOPY;  Service: Gastroenterology;  Laterality: N/A;   ESOPHAGOGASTRODUODENOSCOPY (EGD) WITH PROPOFOL  N/A 08/20/2015   Procedure: ESOPHAGOGASTRODUODENOSCOPY (EGD) WITH PROPOFOL ;  Surgeon: Reyes LELON Cota, MD;  Location: [facility] ENDOSCOPY;  Service: Endoscopy;  Laterality: N/A;   LOWER EXTREMITY ANGIOGRAPHY Left 05/19/2018   Procedure: LOWER EXTREMITY ANGIOGRAPHY;  Surgeon: Marea Selinda RAMAN, MD;  Location: [facility] INVASIVE CV LAB;  Service: Cardiovascular;  Laterality: Left;   LOWER  EXTREMITY ANGIOGRAPHY Right 06/15/2018   Procedure: LOWER EXTREMITY ANGIOGRAPHY;  Surgeon: Marea Selinda RAMAN, MD;  Location: [facility] INVASIVE CV LAB;  Service: Cardiovascular;  Laterality: Right;   lymp node removal Right 2011   arm   left arm 2015   LYMPH NODE BIOPSY  08/2014    Allergies  Allergies  Allergen Reactions   Penicillins     Other reaction(s): Rash, Hives ()   Latex Rash       History of Present Illness      73 y.o. y/o male with a history of CAD, hyperlipidemia, sinus bradycardia, peripheral arterial disease status post lower extremity interventions, CLL, anemia, and tobacco abuse.  He previously established care in May 2022 in the setting of bradycardia, fatigue, chest tightness, and dyspnea.  He underwent coronary CT angiogram in June 2022 revealing severe first diagonal disease and otherwise mild, nonobstructive disease.  Decision was made to pursue medical therapy given history of anemia and improvement in chest pain symptoms.  An echocardiogram in June 2023 showed an EF of 55-60% without regional wall motion abnormalities, mild MR and AI.   [name] was seen in cardiology clinic in May 2025, at which time he reported progressive bilateral lower extremity claudication.  He subsequently followed up with vascular surgery and ABIs were performed showing an ABI of 0.68 on the right and 0.89 on the left.  Duplex showed bilateral SFA occlusions.  Arrangements were made for peripheral angiography however, patient canceled due to finances.  [name] was noted to have bradycardia  with a heart rate of 47 at routine cancer center visit over the summer 2025, prompting referral back to cardiology.  Heart rate rose to 65 with ambulation.  ZIO monitor was ordered.     Unfortunately, patient lost the ZIO monitor and it was never returned. Objective   Home Medications    Current Outpatient Medications  Medication Sig Dispense Refill   aspirin  EC 81 MG tablet Take 81 mg by mouth daily. Swallow  whole. 90 tablet 3   guaiFENesin  (MUCINEX ) 600 MG 12 hr tablet Take 2 tablets (1,200 mg total) by mouth 2 (two) times daily. 28 tablet 3   pantoprazole  (PROTONIX ) 40 MG tablet Take 1 tablet (40 mg total) by mouth daily. Prior to breakfast, 30 min to 1 hr 90 tablet 3   rosuvastatin  (CRESTOR ) 20 MG tablet Take 1 tablet (20 mg total) by mouth daily. 90 tablet 3   tamsulosin  (FLOMAX ) 0.4 MG CAPS capsule Take 2 capsules (0.8 mg total) by mouth daily. 180 capsule 3   traZODone  (DESYREL ) 50 MG tablet Take 0.5-1 tablets (25-50 mg total) by mouth at bedtime. 90 tablet 3   zanubrutinib  (BRUKINSA ) 160 MG tablet Take 1 tablet (160 mg total) by mouth 2 (two) times daily. 60 tablet 2   No current facility-administered medications for this visit.     Physical Exam    VS:  There were no vitals taken for this visit. , BMI There is no height or weight on file to calculate BMI.          GEN: Well nourished, well developed, in no acute distress. HEENT: normal. Neck: Supple, no JVD, carotid bruits, or masses. Cardiac: RRR, no murmurs, rubs, or gallops. No clubbing, cyanosis, edema.  Radials 2+/PT 2+ and equal bilaterally.  Respiratory:  Respirations regular and unlabored, clear to auscultation bilaterally. GI: Soft, nontender, nondistended, BS + x 4. MS: no deformity or atrophy. Skin: warm and dry, no rash. Neuro:  Strength and sensation are intact. Psych: Normal affect.  Accessory Clinical Findings    ECG personally reviewed by me today -    *** - no acute changes.  Lab Results  Component Value Date   WBC 5.7 10/31/2024   HGB 11.3 (L) 10/31/2024   HCT 36.1 (L) 10/31/2024   MCV 77.5 (L) 10/31/2024   PLT 229 10/31/2024   Lab Results  Component Value Date   CREATININE 1.11 10/31/2024   BUN 10 10/31/2024   NA 139 10/31/2024   K 4.3 10/31/2024   CL 108 10/31/2024   CO2 24 10/31/2024   Lab Results  Component Value Date   ALT 10 10/31/2024   AST 17 10/31/2024   ALKPHOS 39 10/31/2024    BILITOT 0.6 10/31/2024   Lab Results  Component Value Date   CHOL 173 04/20/2023   HDL 35.10 (L) 04/20/2023   LDLCALC 124 (H) 04/20/2023   TRIG 71.0 04/20/2023   CHOLHDL 5 04/20/2023    Lab Results  Component Value Date   HGBA1C 5.9 01/16/2022   Lab Results  Component Value Date   TSH 1.76 09/08/2023       Assessment & Plan    1.  ***  Lonni Meager, NP 11/01/2024, 1:28 PM

## 2024-11-02 ENCOUNTER — Encounter: Payer: Self-pay | Admitting: Nurse Practitioner

## 2024-11-02 ENCOUNTER — Other Ambulatory Visit: Payer: Self-pay

## 2024-11-13 ENCOUNTER — Ambulatory Visit: Admitting: Family

## 2024-11-20 ENCOUNTER — Other Ambulatory Visit: Payer: Self-pay

## 2024-11-20 ENCOUNTER — Other Ambulatory Visit (HOSPITAL_COMMUNITY): Payer: Self-pay

## 2024-11-20 NOTE — Progress Notes (Signed)
 Specialty Pharmacy Refill Coordination Note  [name] is a 73 y.o. male contacted today regarding refills of specialty medication(s) Zanubrutinib  (BRUKINSA )   Patient requested Delivery   Delivery date: 11/21/24   Verified address: [address] [place]    Medication will be filled on: 11/20/24

## 2024-11-29 ENCOUNTER — Telehealth: Payer: Self-pay | Admitting: Family

## 2024-11-29 NOTE — Telephone Encounter (Signed)
 Sch appt to discuss cholesterol follow up Is he taking crestor  daily?  He is due for cholesterol lab  I rec;ed letter from his insurance company

## 2024-11-29 NOTE — Telephone Encounter (Signed)
 Attempted to call pt a 2 nd time vm was full unable to lm

## 2024-11-29 NOTE — Telephone Encounter (Signed)
 Attempted to call pt but was unable to lvm will try back at later date

## 2024-12-01 NOTE — Telephone Encounter (Signed)
 Letter for follow up printed and sent for mailing per Arnett.

## 2024-12-01 NOTE — Telephone Encounter (Signed)
 Mail letter to sch appt

## 2024-12-04 ENCOUNTER — Telehealth: Payer: Self-pay | Admitting: *Deleted

## 2024-12-04 NOTE — Telephone Encounter (Signed)
 Wife left a voicemail stating that the medication is too strong and the patient wants to talk about it.   At 0845, a return call was placed to the patients wife, and a voicemail was left confirming the returned call.   0930-The RN spoke directly with the patient, who is currently taking Zanubrutinib  160 mg. The patient reports experiencing extreme fatigue and states, I dont like these pills. I don't like they way they make me feel. He denies fever, chills, nausea or vomiting. He reports intermittent diarrhea of approximately one to two loose stools per day, as well as some shortness of breath with activity. When questioned about the diarrhea, he stated that his loose stools are not a new problem and have not worsened. He notes that these symptoms of fatigue began after switching from capsules to tablets, despite the dose remaining the same. I feel worse in the evening after I take the last tablets and first thing in the morning. It just takes me a while to get going. The patient is concerned that his lab counts may be low and contributing to his symptoms. He is requesting an earlier appointment with the provider to discuss these concerns.  Next apt is scheduled on 02/01/24. Please advise.

## 2024-12-04 NOTE — Telephone Encounter (Signed)
 Dr. Babara is ok to see pt this week. Thurs or Friday will work best. Please contact pt to schedule MD appt.

## 2024-12-04 NOTE — Telephone Encounter (Signed)
 Rn spoke with patient. He understands that scheduling will call him for a new apt for this week to discuss his concerns.

## 2024-12-07 ENCOUNTER — Inpatient Hospital Stay: Attending: Oncology | Admitting: Oncology

## 2024-12-07 ENCOUNTER — Inpatient Hospital Stay

## 2024-12-07 ENCOUNTER — Other Ambulatory Visit: Payer: Self-pay

## 2024-12-07 VITALS — BP 138/81 | HR 54 | Temp 98.8°F | Resp 20 | Wt 142.8 lb

## 2024-12-07 DIAGNOSIS — C9110 Chronic lymphocytic leukemia of B-cell type not having achieved remission: Secondary | ICD-10-CM

## 2024-12-07 DIAGNOSIS — D649 Anemia, unspecified: Secondary | ICD-10-CM | POA: Diagnosis not present

## 2024-12-07 DIAGNOSIS — D693 Immune thrombocytopenic purpura: Secondary | ICD-10-CM | POA: Insufficient documentation

## 2024-12-07 DIAGNOSIS — Z9221 Personal history of antineoplastic chemotherapy: Secondary | ICD-10-CM | POA: Insufficient documentation

## 2024-12-07 DIAGNOSIS — F17290 Nicotine dependence, other tobacco product, uncomplicated: Secondary | ICD-10-CM | POA: Insufficient documentation

## 2024-12-07 DIAGNOSIS — Z720 Tobacco use: Secondary | ICD-10-CM | POA: Diagnosis not present

## 2024-12-07 LAB — CMP (CANCER CENTER ONLY)
ALT: 11 U/L (ref 0–44)
AST: 21 U/L (ref 15–41)
Albumin: 4.3 g/dL (ref 3.5–5.0)
Alkaline Phosphatase: 47 U/L (ref 38–126)
Anion gap: 9 (ref 5–15)
BUN: 12 mg/dL (ref 8–23)
CO2: 24 mmol/L (ref 22–32)
Calcium: 9.1 mg/dL (ref 8.9–10.3)
Chloride: 108 mmol/L (ref 98–111)
Creatinine: 1.17 mg/dL (ref 0.61–1.24)
GFR, Estimated: >60 mL/min (ref >60)
Glucose, Bld: 117 mg/dL — ABNORMAL HIGH (ref 70–99)
Potassium: 4.8 mmol/L (ref 3.5–5.1)
Sodium: 140 mmol/L (ref 135–145)
Total Bilirubin: 0.4 mg/dL (ref 0.0–1.2)
Total Protein: 6.2 g/dL — ABNORMAL LOW (ref 6.5–8.1)

## 2024-12-07 LAB — CBC WITH DIFFERENTIAL (CANCER CENTER ONLY)
Abs Immature Granulocytes: 0.22 K/uL — ABNORMAL HIGH (ref 0.00–0.07)
Basophils Absolute: 0.1 K/uL (ref 0.0–0.1)
Basophils Relative: 1 %
Eosinophils Absolute: 0.3 K/uL (ref 0.0–0.5)
Eosinophils Relative: 6 %
HCT: 38.5 % — ABNORMAL LOW (ref 39.0–52.0)
Hemoglobin: 12.1 g/dL — ABNORMAL LOW (ref 13.0–17.0)
Immature Granulocytes: 4 %
Lymphocytes Relative: 37 %
Lymphs Abs: 2 K/uL (ref 0.7–4.0)
MCH: 24.3 pg — ABNORMAL LOW (ref 26.0–34.0)
MCHC: 31.4 g/dL (ref 30.0–36.0)
MCV: 77.5 fL — ABNORMAL LOW (ref 80.0–100.0)
Monocytes Absolute: 0.8 K/uL (ref 0.1–1.0)
Monocytes Relative: 15 %
Neutro Abs: 2 K/uL (ref 1.7–7.7)
Neutrophils Relative %: 37 %
Platelet Count: 183 K/uL (ref 150–400)
RBC: 4.97 MIL/uL (ref 4.22–5.81)
RDW: 18.2 % — ABNORMAL HIGH (ref 11.5–15.5)
Smear Review: NORMAL
WBC Count: 5.4 K/uL (ref 4.0–10.5)
nRBC: 0 % (ref 0.0–0.2)

## 2024-12-07 LAB — LACTATE DEHYDROGENASE: LDH: 168 U/L (ref 105–235)

## 2024-12-07 NOTE — Assessment & Plan Note (Signed)
 Recurrent CLL, trisomy 12 and TP53 mutated, IgVH Somatic Hypermutation unmutated He did not Acalabrutinib  due to diarrhea. He prefers not to try reduced dose, S/p Rituximab  weekly x 4, CT showed progression--> Abdominal mass biopsy confirmed CLL, no evidence of transformation--> Zanubrutinib  160 mg twice daily. --? 06/2023 CT showed partial response.  Labs reviewed and discussed with patient. Wbc has normalized Overall he tolerates Zanubrutinib  160 mg twice daily.  He tolerates well.  Reassurance was given.  Anticipate the new tablet dose form to have similar therapeutic and safety profile.  He agrees to continue. Recommend patient to continue current regimen.

## 2024-12-07 NOTE — Assessment & Plan Note (Signed)
 Recommend smoke cessation.

## 2024-12-07 NOTE — Assessment & Plan Note (Signed)
 Recommend otc calcium  supplementation.  Calcium  has improved

## 2024-12-07 NOTE — Progress Notes (Signed)
 Hematology/Oncology Progress note [phone] [phone]   Chief Complaint: [name] is a 73 y.o.  male presents to follow up for chronic lymphocytic lymphoma.   ASSESSMENT & PLAN:   Cancer Staging  Chronic lymphocytic leukemia (CLL), B-cell (HCC) Staging form: Chronic Lymphocytic Leukemia / Small Lymphocytic Lymphoma, AJCC 8th Edition - Clinical stage from 07/23/2017: Modified Rai Stage III (Modified Rai risk: High, Lymphocytosis: Present, Adenopathy: Present, Organomegaly: Absent, Anemia: Present, Thrombocytopenia: Absent) - Signed by [name], MD on 02/19/2022   Chronic lymphocytic leukemia (CLL), B-cell (HCC) Recurrent CLL, trisomy 12 and TP53 mutated, IgVH Somatic Hypermutation unmutated He did not Acalabrutinib  due to diarrhea. He prefers not to try reduced dose, S/p Rituximab  weekly x 4, CT showed progression--> Abdominal mass biopsy confirmed CLL, no evidence of transformation--> Zanubrutinib  160 mg twice daily. --? 06/2023 CT showed partial response.  Labs reviewed and discussed with patient. Wbc has normalized Overall he tolerates Zanubrutinib  160 mg twice daily.  He tolerates well.  Reassurance was given.  Anticipate the new tablet dose form to have similar therapeutic and safety profile.  He agrees to continue. Recommend patient to continue current regimen.   Tobacco abuse Recommend smoke cessation.  Normocytic anemia Hemoglobin has improved to monitor.  Hypocalcemia Recommend otc calcium  supplementation.  Calcium  has improved    Follow-up  3 months lab MD    All questions were answered. The patient knows to call the clinic with any problems, questions or concerns.  Call Babara, MD, PhD [facility] Hematology Oncology 12/07/2024     HPI:   Oncology History  Chronic lymphocytic leukemia (CLL), B-cell (HCC)  09/13/2014 Initial Diagnosis   CLL - hemolytic anemia in 01/2014.  He was treated with prednisone . 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  -Oct 2020 second opinion at [facility] and was evaluated by heme-onc Dr.Ellis.  Patient was recommended for watchful waiting   10/02/2014 - 10/23/2014 Chemotherapy   -10/02/2014- 10/23/2014  4 weekly cycles of Rituxan   -07/23/2017 Decision was made to start Ibrutinib  as patient is very symptomatic (fatigue and weight loss, lack of appetite). Patient did not show up at the chemo education class   05/08/2022 Imaging   CT CHEST, ABDOMEN, AND PELVIS WITH CONTRAST 1 unchanged enlarged bilateral axillary lymph nodes and prominent pretracheal mediastinal lymph nodes. 2. However, when compared to most recent prior CT examination of the abdomen and pelvis dated 06/24/2016, there are numerous, extremely bulky lymph nodes throughout the abdomen and pelvis, which are greatly increased in size. Newly enlarged left inguinal lymph nodes, in keeping with reported palpable lymphadenopathy. 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 3. Mild right hydronephrosis; the proximal ureter appears compressed or obstructed by overlying lymphadenopathy. 4. Emphysema.5. Coronary artery disease.   05/30/2022 Imaging   PET scan showed  1. Again seen is massive nodal conglomeration within the abdomen with mildly diffuse increased tracer uptake with SUV max of 3.15.There also enlarged bilateral pelvic and left inguinal lymph nodes which exhibit mild increased tracer uptake compatible with residual/recurrent lymphoma. Imaging findings are compatible with residual/recurrent metabolically active low-grade lymphoma. 2. No significant tracer uptake associated with the borderline enlarged bilateral axillary lymph nodes.3. Unchanged appearance of small nonspecific subpleural nodules within the periphery of the upper lobes measuring up to 4 mm. These are  too small to characterize by PET-CT.4. Aortic Atherosclerosis (ICD10-I70.0) and  Emphysema (ICD10-J43.9). 5. Coronary artery calcifications. 6. Normal size spleen with background activity similar to the liver   06/22/2022 -  Chemotherapy   acalabrutinib  100 mg twice daily.   07/27/2022 - 08/20/2022 Chemotherapy   Patient is on Treatment Plan : ITP Rituximab  q7d x 4 cycles     11/09/2022 Imaging   CT abdomen pelvis w contrast  1. Progressive bulky retroperitoneal and mesenteric lymphadenopathy consistent with progressive lymphoma. 2. No evidence of bowel obstruction, perforation or abscess. 3. Stable small amount of free pelvic fluid. 4. Aortic Atherosclerosis (ICD10-I70.0). Chronically occluded right femoral arterial stent    11/09/2022 [facility] Admission   Patient presented to emergency room for evaluation of shortness of breath.  He has noticed progressive worsening of abdominal swelling and distention.  Associated with shortness of breath.  Patient was started on diuretics.     11/24/2022 Procedure   CT guided biopsy of abdomen mass  Pathology showed CLL/SLL Core biopsy sections demonstrate lymphoid tissue with partially effaced follicular architecture.  The bulk of the tissue consists of sheets of small lymphocytes with dense chromatin and scant cytoplasm.  There is no significant increase in larger prolymphocytic type cells.  Relatively few small germinal centers are appreciated.  Concurrent flow cytometric studies demonstrate a CD5+, CD23 + monoclonal B-cell population comprising 87% of total lymphoid cells and 100% of B cells.  The phenotype is compatible with chronic lymphocytic leukemia/small lymphocytic lymphoma.  There are no features in the current sample to suggest progression to higher grade process. Clinical and radiographic correlation is recommended.    12/09/2022 -  Chemotherapy   Zanubrutinib  160 mg twice daily     INTERVAL HISTORY 73 y.o. male with history of CLL  presents for follow-up. Patient has been on Zanubrutinib  160 mg twice daily, Recently pharmaceutical company has changed dosage forms and now with tablets replacing capsules.  Patient request to follow-up for further discussion.  He was feeling some fatigue after the change of medication.  Now getting better. Denies any nausea vomiting, denies abdominal pain. Occasionally he has diarrhea   Past Medical History:  Diagnosis Date   Anemia 2008   Arthritis    CAD (coronary artery disease)    a. 05/2021 Cor CTA: LM nl, LAD 25-49%, D1 >70 (FFRct 0.73), LCXnl, RCA <25% throughout-->Med rx.   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 Babara   History of echocardiogram    a. 05/2022 Echo: EF 55-60%, no rwma, nl RV fxn, mildly dil RA, mild MR, mild AI. Asc Ao 36mm.   Hyperlipidemia    Low back pain    Peripheral vascular disease    a. 04/2018 R SFA/popliteal stenting; b. 05/2018 L SFA/popliteal stenting.    Past Surgical History:  Procedure Laterality Date   COLONOSCOPY     COLONOSCOPY WITH PROPOFOL  N/A 07/23/2015   Procedure: COLONOSCOPY WITH PROPOFOL ;  Surgeon: Reyes LELON Cota, MD;  Location: [facility] ENDOSCOPY;  Service: Endoscopy;  Laterality: N/A;   COLONOSCOPY WITH PROPOFOL  N/A 04/20/2022   Procedure: COLONOSCOPY WITH PROPOFOL ;  Surgeon: Therisa Bi, MD;  Location: [facility] ENDOSCOPY;  Service: Gastroenterology;  Laterality: N/A;   ESOPHAGOGASTRODUODENOSCOPY (EGD) WITH PROPOFOL  N/A 08/20/2015   Procedure: ESOPHAGOGASTRODUODENOSCOPY (EGD) WITH PROPOFOL ;  Surgeon: Reyes LELON Cota, MD;  Location: [facility] ENDOSCOPY;  Service: Endoscopy;  Laterality: N/A;   LOWER EXTREMITY ANGIOGRAPHY Left 05/19/2018   Procedure: LOWER EXTREMITY ANGIOGRAPHY;  Surgeon: Marea Selinda RAMAN, MD;  Location: [facility] INVASIVE CV LAB;  Service: Cardiovascular;  Laterality: Left;   LOWER EXTREMITY ANGIOGRAPHY Right 06/15/2018   Procedure: LOWER EXTREMITY ANGIOGRAPHY;  Surgeon: Marea Selinda RAMAN, MD;  Location:  [facility] INVASIVE CV LAB;  Service: Cardiovascular;  Laterality: Right;   lymp node removal Right 2011   arm   left arm 2015   LYMPH NODE BIOPSY  08/2014    Family History  Problem Relation Age of Onset   Alcohol abuse Mother    Cirrhosis Mother    Heart attack Father    Kidney cancer Neg Hx        lung cancer   Bladder Cancer Neg Hx    Prostate cancer Neg Hx    AAA (abdominal aortic aneurysm) Neg Hx     Social History:  reports that he has been smoking cigars. He has never used smokeless tobacco. He reports that he does not drink alcohol and does not use drugs.  Allergies:  Allergies  Allergen Reactions   Penicillins     Other reaction(s): Rash, Hives ()   Latex Rash   Review of Systems:  Review of Systems  Constitutional:  Negative for chills, fever, malaise/fatigue and weight loss.  HENT:  Negative for ear discharge, ear pain, nosebleeds and sore throat.   Eyes:  Negative for photophobia, pain and redness.  Respiratory:  Negative for cough, hemoptysis, sputum production, shortness of breath and wheezing.   Cardiovascular:  Negative for chest pain, palpitations and leg swelling.  Gastrointestinal:  Positive for diarrhea. Negative for abdominal pain, blood in stool, constipation, heartburn, nausea and vomiting.  Genitourinary:  Negative for dysuria and frequency.  Musculoskeletal:  Negative for myalgias and neck pain.  Skin:  Negative for rash.  Neurological:  Negative for dizziness, tingling, tremors, weakness and headaches.  Endo/Heme/Allergies:  Does not bruise/bleed easily.  Psychiatric/Behavioral:  Negative for depression, hallucinations and suicidal ideas.      Current Outpatient Medications on File Prior to Visit  Medication Sig Dispense Refill   aspirin  EC 81 MG tablet Take 81 mg by mouth daily. Swallow whole. 90 tablet 3   guaiFENesin  (MUCINEX ) 600 MG 12 hr tablet Take 2 tablets (1,200 mg total) by mouth 2 (two) times daily. 28 tablet 3   pantoprazole  (PROTONIX ) 40  MG tablet Take 1 tablet (40 mg total) by mouth daily. Prior to breakfast, 30 min to 1 hr 90 tablet 3   rosuvastatin  (CRESTOR ) 20 MG tablet Take 1 tablet (20 mg total) by mouth daily. 90 tablet 3   tamsulosin  (FLOMAX ) 0.4 MG CAPS capsule Take 2 capsules (0.8 mg total) by mouth daily. 180 capsule 3   traZODone  (DESYREL ) 50 MG tablet Take 0.5-1 tablets (25-50 mg total) by mouth at bedtime. 90 tablet 3   zanubrutinib  (BRUKINSA ) 160 MG tablet Take 1 tablet (160 mg total) by mouth 2 (two) times daily. 60 tablet 2   No current facility-administered medications on file prior to visit.    Physical Exam: Blood pressure 138/81, pulse (!) 54, temperature 98.8 F (37.1 C), resp. rate 20, weight 142 lb 12.8 oz (64.8 kg), SpO2 100%. Physical Exam Constitutional:      General: He is not in acute distress.    Appearance: He is not diaphoretic.  HENT:     Head: Normocephalic.  Eyes:     General: No scleral icterus. Cardiovascular:     Rate and Rhythm: Regular rhythm. Bradycardia present.  Pulmonary:     Effort: Pulmonary effort is normal. No respiratory distress.     Breath sounds: No  wheezing.  Abdominal:     General: Bowel sounds are normal. There is no distension.     Palpations: Abdomen is soft. There is no mass.  Genitourinary:    Comments:   Musculoskeletal:        General: Normal range of motion.     Cervical back: Normal range of motion.  Skin:    General: Skin is warm and dry.     Findings: No erythema.  Neurological:     Mental Status: He is alert and oriented to person, place, and time. Mental status is at baseline.     Cranial Nerves: No cranial nerve deficit.     Motor: No abnormal muscle tone.  Psychiatric:        Mood and Affect: Mood and affect normal.     LABORATORY RESULTS.    Latest Ref Rng & Units 12/07/2024   10:08 AM 10/31/2024   10:16 AM 07/25/2024    9:55 AM  CBC  WBC 4.0 - 10.5 K/uL 5.4  5.7  6.6   Hemoglobin 13.0 - 17.0 g/dL 87.8  88.6  87.8   Hematocrit  39.0 - 52.0 % 38.5  36.1  37.5   Platelets 150 - 400 K/uL 183  229  198       Latest Ref Rng & Units 12/07/2024   10:08 AM 10/31/2024   10:16 AM 07/25/2024    9:56 AM  CMP  Glucose 70 - 99 mg/dL 882  96  95   BUN 8 - 23 mg/dL 12  10  12    Creatinine 0.61 - 1.24 mg/dL 8.82  8.88  8.70   Sodium 135 - 145 mmol/L 140  139  139   Potassium 3.5 - 5.1 mmol/L 4.8  4.3  4.6   Chloride 98 - 111 mmol/L 108  108  108   CO2 22 - 32 mmol/L 24  24  25    Calcium  8.9 - 10.3 mg/dL 9.1  8.8  8.9   Total Protein 6.5 - 8.1 g/dL 6.2  6.1  5.9   Total Bilirubin 0.0 - 1.2 mg/dL 0.4  0.6  0.7   Alkaline Phos 38 - 126 U/L 47  39  40   AST 15 - 41 U/L 21  17  19    ALT 0 - 44 U/L 11  10  13       PATHOLOGY:  11/02/2016 Peripheral blood FISH studies revealed 20% of nuclei positive for loss of 1 ATM signal, 63% of nuclei positive for trisomy 12, and 32% of nuclei positive for loss of 1 TP53 signal. Results for CCND1/IGH and 13q were normal.  09/13/2014 Left axillary node biopsy on  confirmed B-cell small lymphocytic lymphoma (B-CLL/SLL)   IMAGE STUDIES No results found.

## 2024-12-07 NOTE — Assessment & Plan Note (Signed)
 Hemoglobin has improved to monitor.

## 2024-12-10 ENCOUNTER — Other Ambulatory Visit: Payer: Self-pay

## 2024-12-10 ENCOUNTER — Emergency Department
Admission: EM | Admit: 2024-12-10 | Discharge: 2024-12-10 | Disposition: A | Discharge status: Home / Self Care | Attending: Emergency Medicine | Admitting: Emergency Medicine

## 2024-12-10 DIAGNOSIS — S00412A Abrasion of left ear, initial encounter: Secondary | ICD-10-CM | POA: Diagnosis not present

## 2024-12-10 DIAGNOSIS — X58XXXA Exposure to other specified factors, initial encounter: Secondary | ICD-10-CM | POA: Insufficient documentation

## 2024-12-10 DIAGNOSIS — H9202 Otalgia, left ear: Secondary | ICD-10-CM | POA: Diagnosis present

## 2024-12-10 NOTE — ED Provider Notes (Signed)
 "   [facility] Emergency Department Provider Note     Event Date/Time   First MD Initiated Contact with Patient 12/10/24 1118     (approximate)   History   Otalgia   HPI  [name] is a 73 y.o. male presents to the ED for evaluation of some left ear bleeding.  He woke this morning to find blood on his pillow.  He noted some coagulated blood on it cotton swab he instilled in the left ear after awakening.  He denies any hearing loss, vertigo, tinnitus.  He also denies any recent trauma to the ears.  He does endorse using cotton swabs daily to cleanse his ears.  Physical Exam   Triage Vital Signs: ED Triage Vitals  Encounter Vitals Group     BP 12/10/24 0947 131/86     Girls Systolic BP Percentile --      Girls Diastolic BP Percentile --      Boys Systolic BP Percentile --      Boys Diastolic BP Percentile --      Pulse Rate 12/10/24 0947 (!) 54     Resp 12/10/24 0947 18     Temp 12/10/24 0947 98.1 F (36.7 C)     Temp src --      SpO2 12/10/24 0947 98 %     Weight 12/10/24 0946 142 lb (64.4 kg)     Height 12/10/24 0946 5' 8 (1.727 m)     Head Circumference --      Peak Flow --      Pain Score 12/10/24 0946 7     Pain Loc --      Pain Education --      Exclude from Growth Chart --     Most recent vital signs: Vitals:   12/10/24 1232 12/10/24 1234  BP:  128/62  Pulse:  60  Resp:  18  Temp:  98.1 F (36.7 C)  SpO2: 98% 99%    General Awake, no distress.  NAD HEENT NCAT. PERRL. EOMI. No rhinorrhea. Mucous membranes are moist.  Left TM with evidence of blood in the canal inferiorly.  TM is intact without bulging, purulence, or effusion. CV:  Good peripheral perfusion.  RESP:  Normal effort.   ED Results / Procedures / Treatments   Labs (all labs ordered are listed, but only abnormal results are displayed) Labs Reviewed - No data to display   EKG    RADIOLOGY   No results found.   PROCEDURES:  Critical Care performed:  No  Procedures   MEDICATIONS ORDERED IN ED: Medications - No data to display   IMPRESSION / MDM / ASSESSMENT AND PLAN / ED COURSE  I reviewed the triage vital signs and the nursing notes.                              Differential diagnosis includes, but is not limited to, otitis media, TM rupture, otitis externa, canal abrasion  Patient's presentation is most consistent with acute, uncomplicated illness.  Patient's diagnosis is consistent with left ear canal abrasion.  Patient presents for concern over some bleeding noted on his pillow upon awakening.  Exam reveals an intact TM on the left, with evidence of some scant bleeding in the proximal canal.  No active bleeding on exam.  TM is intact.  Patient will be discharged home with wound care instructions. Patient is to follow up with his PCP  as needed or otherwise directed. Patient is given ED precautions to return to the ED for any worsening or new symptoms.    FINAL CLINICAL IMPRESSION(S) / ED DIAGNOSES   Final diagnoses:  Ear canal abrasion, left, initial encounter     Rx / DC Orders   ED Discharge Orders     None        Note:  This document was prepared using Dragon voice recognition software and may include unintentional dictation errors.    Loyd Candida LULLA Aldona, PA-C 12/10/24 1239    Waymond Lorelle Cummins, MD 12/10/24 1248  "

## 2024-12-10 NOTE — Discharge Instructions (Signed)
 Your exam reveals an abrasion or scratch to the ear canal on the left.  No evidence of a ruptured eardrum.  You should keep the area clean and dry as appropriate.  Consider placing a cotton ball in the ear canal as needed.

## 2024-12-10 NOTE — ED Triage Notes (Signed)
 Pt comes with left ear pain and woke up this morning and there was blood on his pillow. Pt used q tip  and big blood clot came out.

## 2024-12-13 ENCOUNTER — Other Ambulatory Visit: Payer: Self-pay

## 2024-12-15 ENCOUNTER — Other Ambulatory Visit: Payer: Self-pay

## 2024-12-18 ENCOUNTER — Other Ambulatory Visit (HOSPITAL_COMMUNITY): Payer: Self-pay

## 2024-12-29 ENCOUNTER — Other Ambulatory Visit (HOSPITAL_COMMUNITY): Payer: Self-pay

## 2024-12-29 ENCOUNTER — Other Ambulatory Visit: Payer: Self-pay

## 2024-12-29 NOTE — Progress Notes (Signed)
 Specialty Pharmacy Refill Coordination Note  [name] is a 74 y.o. male contacted today regarding refills of specialty medication(s) Zanubrutinib  (BRUKINSA )   Patient requested Delivery   Delivery date: 01/01/25   Verified address: [address] [place]    Medication will be filled on: 12/29/24

## 2025-01-19 ENCOUNTER — Other Ambulatory Visit: Payer: Self-pay

## 2025-01-22 ENCOUNTER — Other Ambulatory Visit (HOSPITAL_COMMUNITY): Payer: Self-pay

## 2025-01-24 ENCOUNTER — Other Ambulatory Visit: Payer: Self-pay

## 2025-01-26 ENCOUNTER — Other Ambulatory Visit (HOSPITAL_COMMUNITY): Payer: Self-pay

## 2025-01-31 ENCOUNTER — Inpatient Hospital Stay: Admitting: Oncology

## 2025-01-31 ENCOUNTER — Inpatient Hospital Stay

## 2025-03-09 ENCOUNTER — Inpatient Hospital Stay

## 2025-03-09 ENCOUNTER — Inpatient Hospital Stay: Admitting: Oncology
